# Patient Record
Sex: Male | Born: 1950 | Race: Black or African American | Hispanic: No | Marital: Married | Smoking: Former smoker
Health system: Southern US, Community
[De-identification: ages and names within clinical notes are randomized; demographics above are authoritative.]

## PROBLEM LIST (undated history)

## (undated) DIAGNOSIS — I10 Essential (primary) hypertension: Secondary | ICD-10-CM

## (undated) DIAGNOSIS — N189 Chronic kidney disease, unspecified: Secondary | ICD-10-CM

## (undated) DIAGNOSIS — C679 Malignant neoplasm of bladder, unspecified: Secondary | ICD-10-CM

## (undated) DIAGNOSIS — C159 Malignant neoplasm of esophagus, unspecified: Secondary | ICD-10-CM

## (undated) DIAGNOSIS — J439 Emphysema, unspecified: Secondary | ICD-10-CM

## (undated) HISTORY — PX: BLADDER REMOVAL: SHX567

## (undated) HISTORY — DX: Emphysema, unspecified: J43.9

## (undated) HISTORY — PX: GASTROSTOMY W/ FEEDING TUBE: SUR642

## (undated) HISTORY — DX: Chronic kidney disease, unspecified: N18.9

## (undated) HISTORY — DX: Malignant neoplasm of bladder, unspecified: C67.9

## (undated) SURGERY — Surgical Case
Anesthesia: *Unknown

---

## 2005-11-28 ENCOUNTER — Ambulatory Visit: Payer: Self-pay | Admitting: Gastroenterology

## 2008-06-22 ENCOUNTER — Emergency Department: Payer: Self-pay | Admitting: Emergency Medicine

## 2009-09-25 ENCOUNTER — Ambulatory Visit: Payer: Self-pay | Admitting: Internal Medicine

## 2009-10-07 ENCOUNTER — Ambulatory Visit: Payer: Self-pay | Admitting: General Practice

## 2009-11-19 ENCOUNTER — Ambulatory Visit: Payer: Self-pay | Admitting: Urology

## 2009-11-27 ENCOUNTER — Ambulatory Visit: Payer: Self-pay | Admitting: Cardiovascular Disease

## 2009-12-02 ENCOUNTER — Observation Stay: Payer: Self-pay | Admitting: Urology

## 2009-12-04 LAB — PATHOLOGY REPORT

## 2010-07-14 ENCOUNTER — Ambulatory Visit: Payer: Self-pay | Admitting: Urology

## 2010-12-15 ENCOUNTER — Ambulatory Visit: Payer: Self-pay | Admitting: Urology

## 2011-01-19 ENCOUNTER — Ambulatory Visit: Payer: Self-pay | Admitting: Urology

## 2011-01-20 LAB — PATHOLOGY REPORT

## 2011-07-08 ENCOUNTER — Ambulatory Visit: Payer: Self-pay | Admitting: Anesthesiology

## 2011-07-13 ENCOUNTER — Ambulatory Visit: Payer: Self-pay | Admitting: Urology

## 2011-07-14 LAB — PATHOLOGY REPORT

## 2011-10-09 ENCOUNTER — Emergency Department: Payer: Self-pay | Admitting: Internal Medicine

## 2011-10-09 LAB — COMPREHENSIVE METABOLIC PANEL WITH GFR
Albumin: 3.8 g/dL
Alkaline Phosphatase: 67 U/L
Anion Gap: 6 — ABNORMAL LOW
BUN: 9 mg/dL
Bilirubin,Total: 1.3 mg/dL — ABNORMAL HIGH
Calcium, Total: 8.3 mg/dL — ABNORMAL LOW
Chloride: 101 mmol/L
Co2: 29 mmol/L
Creatinine: 1.08 mg/dL
EGFR (African American): >60
EGFR (Non-African Amer.): >60
Glucose: 82 mg/dL
Osmolality: 270
Potassium: 3.4 mmol/L — ABNORMAL LOW
SGOT(AST): 78 U/L — ABNORMAL HIGH
SGPT (ALT): 122 U/L — ABNORMAL HIGH
Sodium: 136 mmol/L
Total Protein: 7.3 g/dL

## 2011-10-09 LAB — CBC
HCT: 38.8 % — ABNORMAL LOW
HGB: 13.1 g/dL
MCH: 33.5 pg
MCHC: 33.8 g/dL
MCV: 99 fL
Platelet: 173 x10 3/mm 3
RBC: 3.92 x10 6/mm 3 — ABNORMAL LOW
RDW: 13.8 %
WBC: 7.6 x10 3/mm 3

## 2011-10-09 LAB — URINALYSIS, COMPLETE
Nitrite: NEGATIVE
Protein: NEGATIVE
Specific Gravity: 1.005 (ref 1.003–1.030)
WBC UR: 10 /HPF (ref 0–5)

## 2011-10-09 LAB — TROPONIN I: Troponin-I: 0.03 ng/mL

## 2011-11-18 ENCOUNTER — Ambulatory Visit: Payer: Self-pay | Admitting: Internal Medicine

## 2012-08-24 ENCOUNTER — Inpatient Hospital Stay: Payer: Self-pay | Admitting: Internal Medicine

## 2012-08-24 LAB — URINALYSIS, COMPLETE
Bilirubin,UR: NEGATIVE
Ketone: NEGATIVE
Nitrite: NEGATIVE
Ph: 7 (ref 4.5–8.0)
Squamous Epithelial: 4
Transitional Epi: 2
WBC UR: 3003 /HPF (ref 0–5)

## 2012-08-24 LAB — LIPASE, BLOOD: Lipase: 261 U/L (ref 73–393)

## 2012-08-24 LAB — CK TOTAL AND CKMB (NOT AT ARMC): CK, Total: 51 U/L (ref 35–232)

## 2012-08-24 LAB — COMPREHENSIVE METABOLIC PANEL
Anion Gap: 18 — ABNORMAL HIGH (ref 7–16)
Chloride: 93 mmol/L — ABNORMAL LOW (ref 98–107)
Creatinine: 5.99 mg/dL — ABNORMAL HIGH (ref 0.60–1.30)
EGFR (African American): 11 — ABNORMAL LOW
EGFR (Non-African Amer.): 9 — ABNORMAL LOW
Glucose: 131 mg/dL — ABNORMAL HIGH (ref 65–99)
Osmolality: 307 (ref 275–301)
Potassium: 5 mmol/L (ref 3.5–5.1)
SGOT(AST): 16 U/L (ref 15–37)
Sodium: 123 mmol/L — ABNORMAL LOW (ref 136–145)

## 2012-08-24 LAB — CBC WITH DIFFERENTIAL/PLATELET
Basophil #: 0.2 10*3/uL — ABNORMAL HIGH (ref 0.0–0.1)
HGB: 13.5 g/dL (ref 13.0–18.0)
MCH: 32.5 pg (ref 26.0–34.0)
MCHC: 34.4 g/dL (ref 32.0–36.0)
MCV: 94 fL (ref 80–100)
Monocyte #: 0.8 x10 3/mm (ref 0.2–1.0)
Monocyte %: 4.1 %
WBC: 18.8 10*3/uL — ABNORMAL HIGH (ref 3.8–10.6)

## 2012-08-24 LAB — TROPONIN I: Troponin-I: <0.02 ng/mL

## 2012-08-24 LAB — TSH: Thyroid Stimulating Horm: 0.782 u[IU]/mL

## 2012-08-25 ENCOUNTER — Ambulatory Visit: Payer: Self-pay | Admitting: Urology

## 2012-08-25 LAB — PHOSPHORUS: Phosphorus: 6.3 mg/dL — ABNORMAL HIGH (ref 2.5–4.9)

## 2012-08-25 LAB — URINALYSIS, COMPLETE
Bilirubin,UR: NEGATIVE
Ketone: NEGATIVE
Nitrite: NEGATIVE
Protein: 30
Specific Gravity: 1.01 (ref 1.003–1.030)
Squamous Epithelial: 1

## 2012-08-25 LAB — CBC WITH DIFFERENTIAL/PLATELET
Basophil #: 0.1 10*3/uL (ref 0.0–0.1)
Basophil %: 0.5 %
Eosinophil #: 0 10*3/uL (ref 0.0–0.7)
Eosinophil %: 0.3 %
HCT: 32.1 % — ABNORMAL LOW (ref 40.0–52.0)
Lymphocyte #: 1.2 10*3/uL (ref 1.0–3.6)
Lymphocyte %: 7.6 %
MCH: 32.7 pg (ref 26.0–34.0)
MCV: 95 fL (ref 80–100)
Monocyte %: 9 %
RBC: 3.37 10*6/uL — ABNORMAL LOW (ref 4.40–5.90)
RDW: 14 % (ref 11.5–14.5)
WBC: 15.9 10*3/uL — ABNORMAL HIGH (ref 3.8–10.6)

## 2012-08-25 LAB — COMPREHENSIVE METABOLIC PANEL
Albumin: 2.5 g/dL — ABNORMAL LOW (ref 3.4–5.0)
Alkaline Phosphatase: 56 U/L (ref 50–136)
Bilirubin,Total: 0.3 mg/dL (ref 0.2–1.0)
Calcium, Total: 8.6 mg/dL (ref 8.5–10.1)
Chloride: 104 mmol/L (ref 98–107)
Creatinine: 3.48 mg/dL — ABNORMAL HIGH (ref 0.60–1.30)
EGFR (African American): 21 — ABNORMAL LOW
Glucose: 93 mg/dL (ref 65–99)
Potassium: 5 mmol/L (ref 3.5–5.1)
SGOT(AST): 11 U/L — ABNORMAL LOW (ref 15–37)
SGPT (ALT): 10 U/L — ABNORMAL LOW (ref 12–78)
Sodium: 129 mmol/L — ABNORMAL LOW (ref 136–145)

## 2012-08-25 LAB — LIPID PANEL
Ldl Cholesterol, Calc: 58 mg/dL (ref 0–100)
Triglycerides: 93 mg/dL (ref 0–200)
VLDL Cholesterol, Calc: 19 mg/dL (ref 5–40)

## 2012-08-25 LAB — MAGNESIUM: Magnesium: 2.1 mg/dL

## 2012-08-26 LAB — CBC WITH DIFFERENTIAL/PLATELET
Basophil %: 0.2 %
Eosinophil %: 0.2 %
HGB: 10.1 g/dL — ABNORMAL LOW (ref 13.0–18.0)
Lymphocyte %: 2.5 %
MCH: 32.2 pg (ref 26.0–34.0)
MCHC: 34.2 g/dL (ref 32.0–36.0)
Monocyte #: 1.8 x10 3/mm — ABNORMAL HIGH (ref 0.2–1.0)
Neutrophil #: 18.6 10*3/uL — ABNORMAL HIGH (ref 1.4–6.5)
Neutrophil %: 88.4 %
Platelet: 324 10*3/uL (ref 150–440)
RBC: 3.14 10*6/uL — ABNORMAL LOW (ref 4.40–5.90)
RDW: 13.8 % (ref 11.5–14.5)
WBC: 21 10*3/uL — ABNORMAL HIGH (ref 3.8–10.6)

## 2012-08-26 LAB — BASIC METABOLIC PANEL
Anion Gap: 10 (ref 7–16)
Calcium, Total: 8.2 mg/dL — ABNORMAL LOW (ref 8.5–10.1)
Chloride: 113 mmol/L — ABNORMAL HIGH (ref 98–107)
Glucose: 115 mg/dL — ABNORMAL HIGH (ref 65–99)
Osmolality: 304 (ref 275–301)
Potassium: 4.3 mmol/L (ref 3.5–5.1)

## 2012-08-27 LAB — CBC WITH DIFFERENTIAL/PLATELET
Basophil #: 0.1 10*3/uL (ref 0.0–0.1)
Eosinophil #: 0.2 10*3/uL (ref 0.0–0.7)
Eosinophil %: 0.7 %
HCT: 31 % — ABNORMAL LOW (ref 40.0–52.0)
HGB: 10.6 g/dL — ABNORMAL LOW (ref 13.0–18.0)
Lymphocyte #: 1 10*3/uL (ref 1.0–3.6)
Lymphocyte %: 4.4 %
MCH: 32.4 pg (ref 26.0–34.0)
MCHC: 34.2 g/dL (ref 32.0–36.0)
MCV: 95 fL (ref 80–100)
Monocyte #: 1.9 x10 3/mm — ABNORMAL HIGH (ref 0.2–1.0)
Monocyte %: 8.1 %
Platelet: 331 10*3/uL (ref 150–440)
RBC: 3.27 10*6/uL — ABNORMAL LOW (ref 4.40–5.90)
RDW: 13.7 % (ref 11.5–14.5)

## 2012-08-27 LAB — BASIC METABOLIC PANEL
Anion Gap: 7 (ref 7–16)
BUN: 50 mg/dL — ABNORMAL HIGH (ref 7–18)
Chloride: 112 mmol/L — ABNORMAL HIGH (ref 98–107)
EGFR (African American): 50 — ABNORMAL LOW
Glucose: 102 mg/dL — ABNORMAL HIGH (ref 65–99)
Osmolality: 291 (ref 275–301)
Potassium: 4.6 mmol/L (ref 3.5–5.1)
Sodium: 139 mmol/L (ref 136–145)

## 2012-08-27 LAB — URINE CULTURE

## 2012-08-28 LAB — CBC WITH DIFFERENTIAL/PLATELET
Eosinophil #: 0.1 10*3/uL (ref 0.0–0.7)
HCT: 27.8 % — ABNORMAL LOW (ref 40.0–52.0)
HGB: 9.3 g/dL — ABNORMAL LOW (ref 13.0–18.0)
Lymphocyte #: 1.3 10*3/uL (ref 1.0–3.6)
MCHC: 33.3 g/dL (ref 32.0–36.0)
Monocyte %: 8.3 %
Neutrophil #: 11 10*3/uL — ABNORMAL HIGH (ref 1.4–6.5)
Neutrophil %: 80.5 %
RDW: 13.7 % (ref 11.5–14.5)

## 2012-08-28 LAB — IRON AND TIBC
Iron Bind.Cap.(Total): 131 ug/dL — ABNORMAL LOW (ref 250–450)
Iron Saturation: 43 %
Iron: 56 ug/dL — ABNORMAL LOW (ref 65–175)

## 2012-08-28 LAB — RETICULOCYTES
Absolute Retic Count: 0.0179 10*6/uL — ABNORMAL LOW
Reticulocyte: 0.6 %

## 2012-08-28 LAB — BASIC METABOLIC PANEL
BUN: 33 mg/dL — ABNORMAL HIGH (ref 7–18)
Creatinine: 1.63 mg/dL — ABNORMAL HIGH (ref 0.60–1.30)
EGFR (African American): 52 — ABNORMAL LOW
EGFR (Non-African Amer.): 45 — ABNORMAL LOW
Glucose: 108 mg/dL — ABNORMAL HIGH (ref 65–99)
Potassium: 4.5 mmol/L (ref 3.5–5.1)
Sodium: 139 mmol/L (ref 136–145)

## 2012-08-28 LAB — FOLATE: Folic Acid: 12 ng/mL (ref 3.1–100.0)

## 2012-08-29 LAB — CBC WITH DIFFERENTIAL/PLATELET
Basophil %: 0.7 %
Eosinophil #: 0.1 10*3/uL (ref 0.0–0.7)
Eosinophil %: 0.9 %
HGB: 9.4 g/dL — ABNORMAL LOW (ref 13.0–18.0)
Lymphocyte #: 1.7 10*3/uL (ref 1.0–3.6)
MCH: 32.5 pg (ref 26.0–34.0)
MCHC: 34.1 g/dL (ref 32.0–36.0)
MCV: 95 fL (ref 80–100)
Monocyte #: 1.2 x10 3/mm — ABNORMAL HIGH (ref 0.2–1.0)
Monocyte %: 8.7 %
Neutrophil #: 11 10*3/uL — ABNORMAL HIGH (ref 1.4–6.5)
Neutrophil %: 77.6 %
Platelet: 354 10*3/uL (ref 150–440)
RBC: 2.91 10*6/uL — ABNORMAL LOW (ref 4.40–5.90)
RDW: 13.6 % (ref 11.5–14.5)
WBC: 14.2 10*3/uL — ABNORMAL HIGH (ref 3.8–10.6)

## 2012-08-29 LAB — BASIC METABOLIC PANEL
Anion Gap: 7 (ref 7–16)
Calcium, Total: 8.4 mg/dL — ABNORMAL LOW (ref 8.5–10.1)
Creatinine: 1.41 mg/dL — ABNORMAL HIGH (ref 0.60–1.30)
EGFR (Non-African Amer.): 54 — ABNORMAL LOW
Glucose: 89 mg/dL (ref 65–99)
Potassium: 3.9 mmol/L (ref 3.5–5.1)

## 2012-08-30 LAB — CBC WITH DIFFERENTIAL/PLATELET
Basophil %: 0.9 %
Eosinophil %: 1.9 %
HCT: 28.3 % — ABNORMAL LOW (ref 40.0–52.0)
HGB: 9.7 g/dL — ABNORMAL LOW (ref 13.0–18.0)
MCHC: 34.4 g/dL (ref 32.0–36.0)
MCV: 96 fL (ref 80–100)
Monocyte #: 1.1 x10 3/mm — ABNORMAL HIGH (ref 0.2–1.0)
Monocyte %: 8.9 %
Neutrophil #: 9.2 10*3/uL — ABNORMAL HIGH (ref 1.4–6.5)
Platelet: 359 10*3/uL (ref 150–440)
RDW: 13.4 % (ref 11.5–14.5)
WBC: 12.6 10*3/uL — ABNORMAL HIGH (ref 3.8–10.6)

## 2012-08-30 LAB — CREATININE, SERUM
Creatinine: 1.5 mg/dL — ABNORMAL HIGH (ref 0.60–1.30)
EGFR (Non-African Amer.): 50 — ABNORMAL LOW

## 2014-07-25 NOTE — Consult Note (Signed)
PATIENT NAME:  Jeffrey [name], HYDE MR#:  [id number] DATE OF BIRTH:  [birth year]  DATE OF CONSULTATION:  08/28/2012  REFERRING PHYSICIAN:   CONSULTING PHYSICIAN:  Janice Coffin. Elnoria Howard, DO  [name] was admitted 08/24/2012 with sepsis and renal failure. Foley catheter was placed and the patient consequently had a decrease in his creatinine from 5.0 to 1.6.  He had a catheter placed in his bladder.  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 His normal physician is Dr. Clydene Laming who had told the patient he needs to cath himself 4 to 5 times a day, and the patient was probably not really complying with this order. So now he is defervesced. He is sensitive to Rocephin. The plan is to switch him to oral Keflex and discharge him on the same back. He will go back to his family doctor. We are going to put him on q. 6 hour cath regimen, which he can do himself.  They will be in and out caths.  Slowly it will be discontinued. Foley will have to be flushed p.r.n. if the residuals are higher than should be. But I think if he gets on a regular schedule, keeps his bladder clear of debris, he will be fine. So, this 64 year old [place] American male discussed this situation with me. At the present time, his abdomen is soft and he has no evidences of sepsis. Vital signs are stable. With his creatinine being under 2, I think we are safe to have him cathed. I have told him he should be especially sensitive to cathing himself before he goes to bed at night, in the morning when he wakes up and then in between at least twice during the day. He may need some help with learning to flush his catheter if debris occurs. At the present time, his glucose, BUN and creatinine have stabilized.  His white count is down to 13 from as high as 23,000.  He remains afebrile.  Sensitivities are excellent to ceftriaxone and also Cipro, Bactrim and nitrofurantoin. So he will be sent home on Keflex. I have discussed this with  his attending, Dr. Caryl Comes.    ____________________________ Janice Coffin. Elnoria Howard, DO rdh:sb D: 08/28/2012 07:54:47 ET T: 08/28/2012 08:08:55 ET JOB#: [id number]  cc: Janice Coffin. Elnoria Howard, DO, <Dictator>   Jaianna Nicoll D Kerrigan Gombos DO ELECTRONICALLY SIGNED 08/30/2012 17:15

## 2014-07-25 NOTE — Discharge Summary (Signed)
PATIENT NAME:  Jeffrey [name], Jeffrey [name] MR#:  [id number] DATE OF BIRTH:  Apr 10, 1950  DATE OF ADMISSION:  08/24/2012 DATE OF DISCHARGE:  08/30/2012  FINAL DIAGNOSES:  1.  Acute renal failure secondary to urinary obstruction.  2.  Polymicrobial urinary tract infection, including Klebsiella and Citrobacter.  3.  Sepsis secondary to #1 and #2.  4.  History of bladder cancer with multiple prior surgeries.  5.  Hypertension. 6.  Hyperlipidemia.  7.  History of tobacco abuse.   HISTORY AND PHYSICAL: Please see dictated admission history and physical.   HOSPITAL COURSE: The patient was admitted with fatigue, malaise and found to have evidence of acute renal failure. CT scan revealed diffuse bilateral hydronephrosis consistent with urinary obstruction. Catheterization was initiated with significant urine output. The patient was to be self catheterizing at home, but was doing this once a day. Urine cultures were obtained, which were grew out Klebsiella and Citrobacter. He had been placed on Levaquin and the sensitivities suggested these organisms would be susceptible to this; however, he was noted to have worsening white blood cell count with a peak white blood count over 20,000. He was switched over to broader spectrum antibiotics and began to have improvement. He was subsequently changed over to Bactrim, followed and his white count improved down to 12 and he remained afebrile.   Nephrology and urology saw the patient. Urology had the patient catheterize himself 5 times a day and he demonstrated good technique and abilities to perform this. Creatinine improved to 1.5 and he was continued on IV fluids for post obstructive diuresis, which improved over the course of his hospitalization. His blood pressure medications were held. His blood pressure remained stable throughout the rest of his hospitalization. His appetite improved. He was ambulating independently. At this point, he will be discharged to home in stable  condition. His physical activity will be up as tolerated. We will have him out of work for 1 week and return next week to re-evaluate his renal function. He will finish a course of antibiotics and he will need to check his blood pressure and heart rate periodically. He will continue to use self-catheterization of the bladder every 5 hours. He will follow up with nephrology, Dr. Holley Raring, in the next 1 to 2 weeks. I will schedule him to follow up with Dr. Elyse Hsu at [facility] urology in the next 2 to 4 weeks in addition.   DISCHARGE MEDICATIONS:  1.  Bactrim DS one p.o. b.i.d. x 8 days to complete 14 day course of antibiotics.  2.  The patient was given instructions to hold lisinopril at this point.    ____________________________ Adin Hector, MD bjk:aw D: 08/30/2012 08:00:04 ET T: 08/30/2012 10:06:37 ET JOB#: [id number]  cc: Adin Hector, MD, <Dictator> Elyse Hsu, MD AT [facility] UROLOGY Tama High, MD Ramonita Lab MD ELECTRONICALLY SIGNED 09/17/2012 7:19

## 2014-07-25 NOTE — H&P (Signed)
PATIENT NAME:  Jeffrey [name], Jeffrey [name] MR#:  [id number] DATE OF BIRTH:  [birth year]  DATE OF ADMISSION:  08/24/2012  CHIEF COMPLAINT: Weakness, malaise, and dizziness.   REFERRING PHYSICIAN: Pollie Friar, MD   PRIMARY CARE PHYSICIAN: Ramonita Lab, MD  HISTORY OF PRESENT ILLNESS: Mr. Jeffrey [name] is a very nice 64 year old gentleman who has history of hypertension, bladder cancer, hyperlipidemia, tobacco use, and BPH who comes with a history of feeling really weak for the past week or 2. He says he has started to feel really tired for at least 3 weeks and he has been in contact with his primary care physician who has been treating him with antibiotics for what is supposed to be a urinary tract infection. The patient has also a urologist at [facility] who has done removal of his prostate and so far he has been told that he should not get any antibiotics based on the fact that he is always going to have elevated white blood cells. At this moment, the patient comes with a history of just getting tired and tired and getting short of breath when he walks, but mostly just because he feels tired not because he is lacking air, he explains. The patient has not been eating or drinking much. He has been taking some Ensure instead of meals. He just states that he lacks an appetite a lot. He started having some cramping today, 9 out of 10, and he had diarrhea for the past 2 days. He states that his diarrhea is just 1 or 2 bowel movements a day and it is just loose stool. The patient also stated that he has been having occasional chills, but he denies any fever whatsoever. No significant sweating overnight.  The patient states that he has had at least 12 pound weight loss for the past 2 weeks. The patient's wife is very concerned about this situation since her husband was his normal self at least 3 weeks ago without any major symptomatology. The patient is admitted for further evaluation.  REVIEW OF SYSTEMS: CONSTITUTIONAL:  Denies any fever. Positive fatigue. Positive weakness. The patient denies any significant pain. Positive 12 pounds or more weight loss.  EYES: No blurry vision, double vision, pain or redness.  ENT: No tinnitus. No ear pain. The patient has a little bit of a deeper voice, but could be due to his history of smoking, but he denies any odynophagia, any hoarseness. He says that his voice sounds like that all the time. His wife says it might be lower. No difficulty swallowing.  RESPIRATORY: No cough. No wheezing. No hemoptysis. No significant respiratory distress or shortness of breath up until he starts moving around.  CARDIOVASCULAR: No chest pain, orthopnea, syncope, or edema.  GASTROINTESTINAL: Positive mild diarrhea. No nausea.  No vomiting. Positive lack of appetite. No rectal bleeding. No melena. No hematemesis.  GENITOURINARY: No significant dysuria or hematuria. The patient states that he caths himself once or twice a day, but most of the time he is able to urinate by himself. No previous UTIs or prostatitis that he can tell. He has an enlarged prostate and history of bladder tumor that has been removed.  ENDOCRINE: No polyuria, polydipsia, or polyphagia. No cold or heat intolerance. No tachycardia.  HEMATOLOGIC AND LYMPHATIC:  No anemia, easy bruising, bleeding, or swollen glands.  SKIN: No acne, rashes, or new moles.  MUSCULOSKELETAL: No significant neck pain, back pain, shoulder pain, knee pain, or gout.  NEUROLOGIC: No numbness, tingling. No ataxia. No  CVA. No TIA.  PSYCHIATRIC: No insomnia or depression that he can tell.   PAST MEDICAL HISTORY: 1.  Hyperlipidemia.  2.  Benign prostatic hypertrophy.  3.  Tobacco abuse.  4.  History of bladder cancer.  5.  Hypertension.   PAST SURGICAL HISTORY: 1.  Resection of bladder tumor. 2.  Cystoscopy and TURP in the past. He denies any other procedures.   ALLERGIES: ASPIRIN GAVE GI DISTRESS.   MEDICATIONS:  1.  Lisinopril 20 mg once  daily. 2.  Occasionally takes Advil and Benadryl as p.r.n.   FAMILY HISTORY: Positive for cancer in his mom and sister, breast cancer, and his brother had lung cancer. No MIs.  No diabetes or hypertension.    SOCIAL HISTORY: The patient is a former smoker. He quit 3 weeks ago. He has been using the electronic cigarette. He used to smoke 1 pack a day for over 40 years. He lives with his wife and he still works.   PHYSICAL EXAMINATION: VITAL SIGNS: Blood pressure 105/57, pulse 90, respirations 18, temperature 98.1.  GENERAL: Alert and oriented x 3, cachetic, chronically debilitated. No acute distress at this moment. Very flat affect.  Looks severely dehydrated. Pupils are equal and reactive. Extraocular movements are intact. Mucosa is very dry. Anicteric sclerae. Pale conjunctivae. No oral lesions. No oropharyngeal exudates.  NECK: Supple. No JVD. No thyromegaly. No adenopathy. No carotid bruits. I did not palpate any masses or lymphadenopathy. No rigidity.  HEART:  Regular rate and rhythm. No murmurs, rubs, or gallops. Slightly tachycardic, in the 90s. No displacement of PMI.  LUNGS: Clear without any wheezing or crepitus. No use of accessory muscles.  ABDOMEN: Soft, nontender, and nondistended. No hepatosplenomegaly. I did not palpate any masses. I did not palpate any tender areas. No rebound. Bowel sounds are positive.  EXTREMITIES: No edema, no cyanosis, no clubbing. Pulses +2. Capillary refill less than 3.  SKIN: Decreased turgor, but no rashes or petechiae.   MUSCULOSKELETAL: No significant joint effusions. No rigidity of joints. Normal range of motion.  PSYCHIATRIC: Mood: Flat affect without any agitation.  NEUROLOGIC: Cranial nerves II through XII intact. Strength seems to be equal in all 4 extremities, 5 out of 5. DTRs +2. Capillary refill less than 3.  LYMPHATICS: Negative for lymphadenopathy in neck or supraclavicular areas.  LABORATORY AND DIAGNOSTICS:  Glucose 131, BUN 173,  creatinine 5.99, sodium 123, potassium 5, chloride 93, and CO2 12. Total protein 8.5 and albumin 3.2. Troponin is 0.02. White blood cells 18, hemoglobin 13.5, and platelet count clumping, but the smear indicates that it is not decreased, just clumped.  UA is cloudy with 357 red blood cells, white blood cells of 3000. Apparently this is chronic on him. Positive leukocyte esterase.  Negative nitrite.   EKG:  Normal sinus rhythm, sinus tachycardia.   ASSESSMENT AND PLAN: A 64 year old gentleman with history of hypertension and previous bladder cancer status post resection who comes with significant weight loss and malaise.  1.  Malaise and weight loss. The patient has significant history of previous cancer for what we are going to do a CT scan to check for possibility of recurrence.  At this moment, most of his symptoms, lack of appetite, malaise, weakness, and orthostatic hypotension might be related to uremia as his BUN is 173. At this moment, I am going to consult nephrology to see if there is any other workup they would like to do. It seems like the patient has possibilities of being dehydrated from not eating  well and this being prerenal with acute tubular necrosis. I doubt there is an infectious source as the patient has not had any fever. He has increased white blood cells in urine, which apparently is chronic, and overall he is taking a couple of medications that could affect him: 1) lisinopril, 2) Advil. We are going to hold on those 2 medications and avoid any nephrotoxins.  2.  Acute kidney injury. As mentioned above, the patient is uremic.  We are going to consult nephrology. For now we are going to do intravenous fluids with isotonic solution.  3.  Metabolic acidosis. This is likely secondary to acute kidney injury. We will continue with isotonic saline solution and change to bicarbonate drip if necessary, if not improving or worsening.  4.  Hypertension. Hold blood pressure medications as the  patient has orthostatic hypotension. 5.  Bladder tumor status post resection. Get a CT scan to evaluate for possible post-obstructive uropathy from tumor growth versus recurrence of the tumor.  6.  Deep vein thrombosis prophylaxis with heparin pain.  7.  Gastrointestinal prophylaxis with Protonix.  8.  Other medical problems are stable. The patient is a FULL CODE. We are going to pass the care to Dr. Ramonita Lab. time spend 45 min  ____________________________ Burgin Sink, MD rsg:sb D: 08/24/2012 12:33:45 ET T: 08/24/2012 13:25:30 ET JOB#: [id number]  cc: Blasdell Sink, MD, <Dictator> Anikin Prosser America Brown MD ELECTRONICALLY SIGNED 08/30/2012 21:35

## 2014-07-25 NOTE — Consult Note (Signed)
Chief Complaint:  Subjective/Chief Complaint Urology F/U  HD #3  Day #4 IV Abx's (Rocephin x2, changed to Zosyn, now Day #2)  Pt continues without complaints. Still without excessive thirst.   VITAL SIGNS/ANCILLARY NOTES: **Vital Signs.:   26-May-14 04:55  Vital Signs Type Routine  Temperature Temperature (F) 99.9  Celsius 37.7  Temperature Source oral  Pulse Pulse 105  Respirations Respirations 18  Systolic BP Systolic BP 165  Diastolic BP (mmHg) Diastolic BP (mmHg) 72  Mean BP 85  Pulse Ox % Pulse Ox % 96  Pulse Ox Activity Level  At rest  Oxygen Delivery Room Air/ 21 %  *Intake and Output.:   Daily 26-May-14 07:00  Grand Totals Intake:  2618 Output:  3400    Net:  -782 24 Hr.:  -782  Oral Intake      In:  360  IV (Primary)      In:  2108  IV (Secondary)      In:  150  Urine ml     Out:  3400  Length of Stay Totals Intake:  6024 Output:  53748    Net:  -4601    10:26  Grand Totals Intake:   Output:  900    Net:  -900 24 Hr.:  -900  Urine ml     Out:  900  Urinary Method  Foley   Brief Assessment:  Respiratory normal resp effort   Additional Physical Exam WDWN AAM in NAD   Lab Results: LabUnknown:  24-May-14 12:31   CEFOXITIN R  CEFOXITIN S  Routine Micro:  24-May-14 12:31   Organism Name Lares  Organism Name KLEBSIELLA PNEUMONIAE SSP PNEUMONI  Organism Quantity >100,000 CFU  Organism Quantity >100,000 CFU/ML  Nitrofurantoin Sensitivity S  Nitrofurantoin Sensitivity S  Cefazolin Sensitivity R  Cefazolin Sensitivity S  Ceftriaxone Sensitivity S  Ceftriaxone Sensitivity S  Ciprofloxacin Sensitivity S  Ciprofloxacin Sensitivity S  Gentamicin Sensitivity S  Gentamicin Sensitivity S  Imipenem Sensitivity S  Imipenem Sensitivity S  Trimethoprim/Sulfamethoxazole Sensitivty S  Trimethoprim/Sulfamethoxazole Sensitivty S  Lefofloxacin Sensitivity S  Lefofloxacin Sensitivity S  Ampicillin Sensitivity R  Micro Text Report URINE  CULTURE   ORGANISM 1                >100,000 CFU/ML KLEBSIELLA PNEUMONIAE SSP PNEUMONI   ORGANISM 2                >100,000 CFU CITROBACTER FREUNDII   ANTIBIOTIC                    ORG#1    ORG#2     AMPICILLIN                    R         CEFAZOLIN                     S        R         CEFOXITIN                     S        R         CEFTRIAXONE                   S        S         CIPROFLOXACIN  S        S         GENTAMICIN                    S        S        IMIPENEM                      S        S         LEVOFLOXACIN                  S        S         NITROFURANTOIN                S        S         TRIMETHOPRIM/SULFAMETHOXAZOLE S        S  Specimen Source INDWELLING CATH  Organism 1 >100,000 CFU/ML KLEBSIELLA PNEUMONIAE SSP PNEUMONI  Organism 2 >100,000 CFU CITROBACTER FREUNDII  Result(s) reported on 27 Aug 2012 at 09:06AM.  Culture Comment ID TO FOLLOW SENSITIVITIES TO FOLLOW  Result(s) reported on 26 Aug 2012 at 11:59AM.  Routine Chem:  26-May-14 04:14   Glucose, Serum  102  BUN  50  Creatinine (comp)  1.68  Sodium, Serum 139  Potassium, Serum 4.6  Chloride, Serum  112  CO2, Serum  20  Calcium (Total), Serum  8.3  Anion Gap 7  Osmolality (calc) 291  eGFR (African American)  50  eGFR (Non-African American)  43 (eGFR values <70m/min/1.73 m2 may be an indication of chronic kidney disease (CKD). Calculated eGFR is useful in patients with stable renal function. The eGFR calculation will not be reliable in acutely ill patients when serum creatinine is changing rapidly. It is not useful in  patients on dialysis. The eGFR calculation may not be applicable to patients at the low and high extremes of body sizes, pregnant women, and vegetarians.)  Routine UA:  24-May-14 12:31   Color (UA) Yellow  Clarity (UA) Cloudy  Glucose (UA) Negative  Bilirubin (UA) Negative  Ketones (UA) Negative  Specific Gravity (UA) 1.010  Blood (UA) 3+  pH (UA) 6.0   Protein (UA) 30 mg/dL  Nitrite (UA) Negative  Leukocyte Esterase (UA) 3+ (Result(s) reported on 25 Aug 2012 at 01:06PM.)  RBC (UA) 150 /HPF  WBC (UA) 270 /HPF  Bacteria (UA) 3+  Epithelial Cells (UA) 1 /HPF  WBC Clump (UA) PRESENT (Result(s) reported on 25 Aug 2012 at 01:06PM.)  Routine Hem:  26-May-14 04:14   WBC (CBC)  23.6  RBC (CBC)  3.27  Hemoglobin (CBC)  10.6  Hematocrit (CBC)  31.0  Platelet Count (CBC) 331  MCV 95  MCH 32.4  MCHC 34.2  RDW 13.7  Neutrophil % 86.4  Lymphocyte % 4.4  Monocyte % 8.1  Eosinophil % 0.7  Basophil % 0.4  Neutrophil #  20.4  Lymphocyte # 1.0  Monocyte #  1.9  Eosinophil # 0.2  Basophil # 0.1 (Result(s) reported on 27 Aug 2012 at 05:08AM.)   Assessment/Plan:  Invasive Device Daily Assessment of Necessity:  Does the patient currently have any of the following indwelling devices? foley   Indwelling Urinary Catheter continued, requirement due to   Reason to continue Indwelling Urinary Catheter for strict Intake/Output monitoring for hemodynamic instability  inability to void as documented by bladder scanning  Assessment/Plan:  Assessment 1. Obstructive Uropathy -continues to respond nicely to foley catheter decompression of the neobladder -diuresis persists, but slowing somewhat (3422m/24hrs; 9056mmost recent 3.5hrs = 26032mour). Mild tachycardia, but remains hemodynamically stable.  2. Klebsiella and Citrobacter UTI -afebrile with Tmax 99.8F, however with increasing leukocytosis, despite switching to IV Zosyn - ?inducible "on therapy resistance." Both organisms are sensitive to flourquinolones.  3. Bladder Cancer - remains clinically stable  4. h/o ETOH -no signs of withdrawal   Plan 1. Consider adding Cipro to Zosyn. 2. Agree with repeat CT Abd/Pelvis to r/o abscess. 3. Continue to monitor UO/hemodynamics closely. 4. Continue to maintain free access to cool water for prn intake per the pt's thirst mechanism.  Dr. HarElnoria Howardill [facility] at 5am, Tuesday, Aug 28, 2012, if continued Urologic assistance desired (Pt prefers NOT to see Dr. StoBernardo Heater  Electronic Signatures: KimDarcella CheshireD)  (Signed 26-May-14 11:30)  Authored: Chief Complaint, VITAL SIGNS/ANCILLARY NOTES, Brief Assessment, Lab Results, Assessment/Plan   Last Updated: 26-May-14 11:30 by KimDarcella CheshireD)

## 2014-07-25 NOTE — Consult Note (Signed)
Chief Complaint:  Subjective/Chief Complaint Urology F/U  HD #2  Day #3 IV Rocephin  Pt without new complaints. Denies excessive thirst. Passing gas, but no BM - denies abdominal pain, N/V.   VITAL SIGNS/ANCILLARY NOTES: **Vital Signs.:   25-May-14 04:30  Vital Signs Type Routine  Temperature Temperature (F) 98.6  Celsius 37  Temperature Source oral  Pulse Pulse 89  Respirations Respirations 18  Systolic BP Systolic BP 073  Diastolic BP (mmHg) Diastolic BP (mmHg) 62  Mean BP 75  Pulse Ox % Pulse Ox % 97  Pulse Ox Activity Level  At rest  Oxygen Delivery Room Air/ 21 %  *Intake and Output.:   Shift 25-May-14 07:00  Grand Totals Intake:  1500 Output:  1700    Net:  -200 24 Hr.:  -4400  IV (Primary)      In:  1500  Urine ml     Out:  1700  Length of Stay Totals Intake:  3406 Output:  7225    Net:  -3819    Daily 07:00  Grand Totals Intake:  1500 Output:  5900    Net:  -4400 87 Hr.:  -4400  IV (Primary)      In:  1500  Urine ml     Out:  4400  Urine Post Catheter Insertion      Out:  1500  Length of Stay Totals Intake:  3406 Output:  7225    Net:  -3819   Brief Assessment:  Respiratory normal resp effort   Additional Physical Exam WDWN AAM in NAD   Lab Results: Routine Chem:  25-May-14 04:06   Glucose, Serum  115  BUN  94  Creatinine (comp)  2.05  Sodium, Serum 137  Potassium, Serum 4.3  Chloride, Serum  113  CO2, Serum  14  Calcium (Total), Serum  8.2  Anion Gap 10  Osmolality (calc) 304  eGFR (African American)  40  eGFR (Non-African American)  34 (eGFR values <73m/min/1.73 m2 may be an indication of chronic kidney disease (CKD). Calculated eGFR is useful in patients with stable renal function. The eGFR calculation will not be reliable in acutely ill patients when serum creatinine is changing rapidly. It is not useful in  patients on dialysis. The eGFR calculation may not be applicable to patients at the low and high extremes of body sizes,  pregnant women, and vegetarians.)  Routine Hem:  25-May-14 04:06   WBC (CBC)  21.0  RBC (CBC)  3.14  Hemoglobin (CBC)  10.1  Hematocrit (CBC)  29.6  Platelet Count (CBC) 324  MCV 94  MCH 32.2  MCHC 34.2  RDW 13.8  Neutrophil % 88.4  Lymphocyte % 2.5  Monocyte % 8.7  Eosinophil % 0.2  Basophil % 0.2  Neutrophil #  18.6  Lymphocyte #  0.5  Monocyte #  1.8  Eosinophil # 0.0  Basophil # 0.0 (Result(s) reported on 26 Aug 2012 at 05:32AM.)   Assessment/Plan:  Invasive Device Daily Assessment of Necessity:  Does the patient currently have any of the following indwelling devices? foley   Indwelling Urinary Catheter continued, requirement due to   Reason to continue Indwelling Urinary Catheter for strict Intake/Output monitoring for hemodynamic instability  inability to void as documented by bladder scanning   Assessment/Plan:  Assessment 1. Obstructive Uropathy -responding nicely to foley catheter decompression of the neobladder -diuresis persists (44026m24hrs; 170033most recent 5hrs = 340m38mur), but remains hemodynamically stable.  2. GNR UTI -febrile with Tmax 102.8 yesterday afternoon with  increasing leukocytosis, despite IV Rocephin - ? resistant organism  3. Bladder Cancer - remains clinically stable  4. h/o ETOH -no signs of withdrawal   Plan 1. Change antibiotic coverage from Rocephin to Zosyn. 2. Continue to monitor UO/hemodynamics closely. 3. Continue to maintain free access to cool water for prn intake per the pt's thirst mechanism.   Electronic Signatures: Darcella Cheshire (MD)  (Signed [phone] 09:24)  Authored: Chief Complaint, VITAL SIGNS/ANCILLARY NOTES, Brief Assessment, Lab Results, Assessment/Plan   Last Updated: 25-May-14 09:24 by Darcella Cheshire (MD)

## 2014-07-25 NOTE — H&P (Signed)
PATIENT NAME:  Jeffrey [name], Jeffrey [name] MR#:  [id number] DATE OF BIRTH:  March 05, 1951  DATE OF ADMISSION:  08/24/2012  ADDENDUM for history and physical.  The patient had a CT scan done. I am just getting the results back now showing moderate hydronephrosis, bilateral. This is more suspicious today, especially in the left. There are fluid fields structures adjacent might be distended urinary bladder. There are fluid-filled structures beginning on images 82. No acute hepatobiliary abnormalities. Stable right adrenal mass.   The patient's case was discussed with Dr. Candiss Norse who is going to see the patient as well but he feels like this is more a urologic problem as we know from the results of the CT scan. I spoke also with Dr. Cathie Olden, Radiology, who feels like this is mostly an outlet obstruction, that he should be okay without nephrostomy tubes at this moment. I spoke also with Dr. Rogers Blocker, who states that he feels like this is also something that might be improved after a Foley catheter is placed. At this moment, a Foley catheter has been ordered after the results of the MRI were reviewed and we are going to follow up on the results of his labs in the morning.   I checked out the case also to Dr. Ola Spurr.  TIME SPENT:  So far I have spent 90 minutes with this patient today.   ____________________________ [place] Sink, MD rsg:jm D: 08/24/2012 16:59:02 ET T: 08/24/2012 18:19:40 ET JOB#: [id number]  cc: Pascola Sink, MD, <Dictator> Allani Reber America Brown MD ELECTRONICALLY SIGNED 08/30/2012 21:35

## 2014-07-25 NOTE — Consult Note (Signed)
Urology Consultation Report: For Consultation: Obstructive Uropathy MD: Dunlap Sink, M.D.MD: Darcella Cheshire, M.D. Ramonita Lab, M.D.Urologist: Heinz Knuckles. Sherral Hammers, M.D., [facility] 72. y.o. MAAM admitted to [facility] 08/24/2012 with AKI, Metabloic Acidosis, Malaise who was in his usual state of health until 08/10/2012 when he developed  progressive malaise and fatigue with periumbical discomfort, loose BM's, and vocal hoarseness. He was initially treated for an URI with a Z-pack and a nasal spray. When he did not improve, he was referred to the [facility] ER where he was found to be azotemic with a creatinine of 5.99.  He also had a leukocytosis with a WBC of 18.8k, but no fever or systemic signs of infection. A urine culture is growing out two species of GNR's. The pt was placed on Rocephin empirically. CT of the Abd/Pelvis after admission revealed moderate bilateral hydro with a distended bladder. A foley catheter was placed with 1358mL of urine obtained as of 7am this morning. Thus far, today, the pt has put out ?2217mL of urine with 791mL from 7:23am to 10:43 am. His creatinine has improved to 3.48 this morning with the pt feeling subjectively much improved. He denies any excessive sense of thirst. Hx: The pt reports being s/p Radical Cystectomy with Orthotopic Neobladder Formation (Robotic-assisted, Laparoscopic) at [facility] in 12/2011 by Dr. Elyse Hsu (?BCG-refractory High Grade, Urothelial Carcinoma of the Urinary Bladder). The patient reports that after his last f/u appt with Dr. Sherral Hammers in April, he was instructed to increase self-catheterization from every other day, to daily, which he performs at bedtime. He typically obtains 300-534mL with catheterization. He only voids 2-3 times during the day with a good stream (voids with Valsalva) with large volumes passed with occasional urge incontinence. He denies any dysuria, gross hematuria, or F/C. reviewed with the patient as documented by Dr. Danise Mina with the  following exceptions: The pt only performs CIC once a day.The pt denies any h/o hyperlipidemia.The pt denies any past h/o TURP.The pt reports smoking 2ppd for 40 years (he has been using electronic cigarettes for the past 3 weeks).He has a h/o Alcohol Use (1 six-pack beer/day prior to his Cystectomy - developed DT's post-op; currently, only drinks on occas - last, about 4 weeks ago: 1 six-pack).The pt is s/p Radical Cystectomy with OTB at [facility] 12/2011 T (97.32F), P (81), RR (18), BP (93/55), RA Sat (99%)WDWN AAM in NADWarm/Dry, no lesions about the head/neckNC/AT, EOMI, anicteric, mild dental indentation on the tongueno masses or bruitsCTA, nL Resp EffortRRR without M/G/R, 2+ Radial pulses b/LSoft/Flat, NT/ND, NABS, no palpable masses/organomegalyno edema, NTnon-focalA&O x4 as per the HPI  Obstructive Uropathy - this has likely been developing for several weeks. He is responding nicely to foley catheter decompression of his neo-bladder. Would expect 1mg % improvement in the creatinine, per day, with relief of the obstruction. Urine output >272mL/hr this morning, with mild clinical dehydration, but no signs of hemodynamic instability. GNR UTI - clinically stable. Bladder Cancer - clinically stable h/o ETOH  Keep foley until f/u with Dr. Elyse Hsu at [facility] after discharge.Monitor hemodynamics closely while diuresing as he may have impaired renal concentrating ability.Keep 2 pitchers of cool water at the bedside for prn intake per his thirst mechanism (would not replace urine output with MIVF unless develops hemodynamic instability to prevent perpetuation of the diuresis; if hemodynamic instability develops, would replace UO 0.5cc per ml output with NS.)CIWA Protocol follow with you. you for the opportunity to participate in the care of this most pleasant, but complicated gentleman.  Electronic Signatures: Darcella Cheshire (MD)  (Signed on 24-May-14 12:54)  Authored  Last Updated: 24-May-14 12:54 by Darcella Cheshire (MD)

## 2014-07-27 NOTE — Op Note (Signed)
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   POSTOPERATIVE DIAGNOSIS: Bladder tumor.   PROCEDURE: Transurethral resection of bladder tumor (small).   SURGEON: Scott C. Bernardo Heater, MD   ASSISTANT: None.   ANESTHETIC: General.   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 He recently completed a second six week course of intravesical BCG for recurrent noninvasive tumor. Recent surveillance cystoscopy showed some erythema on the posterior wall. A urine cytology returned positive for urothelial malignancy. He presents for cystoscopy under anesthesia and possible bladder biopsy/TURBT.   DESCRIPTION OF PROCEDURE: The patient was taken to the operating room where a general anesthetic was administered. He was placed in the low lithotomy position and his external genitalia were prepped and draped sterilely. Time-out was performed. A 21 French cystoscope with 30 degree lens was lubricated and passed under direct vision. There were two wide caliber bulb strictures which the scope easily negotiated. Prostate shows mild lateral lobe enlargement. Panendoscopy was performed with 30 and 70 degree lenses. On the right hemi trigone there was an area of abnormal appearing tissue with combination of papillary nodular change measuring less than 2 cm. The left ureteral orifice was resected and demonstrates clear efflux. Right ureteral orifice with clear efflux. There was an area of early papillary change just beyond the right ureteral orifice. No other bladder mucosal lesions were identified. The cystoscope was removed. A continuous flow resectoscope sheath with visual obturator was lubricated and passed under direct vision. The [facility] resectoscope with loop was  then placed in the sheath. The right hemi trigone tumor was resected to superficial muscle. All specimen was removed with irrigation. The loop was replaced with a button electrode and the resection site and surrounding mucosa fulgurated. The early papillary tissue beyond the right ureteral orifice was also fulgurated taking care not to involve the ureteral orifices. At the completion of procedure, hemostasis was adequate. The resectoscope was removed. A 20 French Foley catheter was placed with return of clear effluent. A B and O suppository was placed per rectum. He was taken to PAC-U in stable condition. There were no complications and EBL was minimal.   ____________________________ Ronda Fairly. Bernardo Heater, MD scs:drc D: 07/13/2011 11:28:41 ET T: 07/13/2011 12:13:22 ET JOB#: [id number]  cc: Nicki Reaper C. Bernardo Heater, MD, <Dictator> Abbie Sons MD ELECTRONICALLY SIGNED 07/28/2011 18:46

## 2014-09-11 ENCOUNTER — Other Ambulatory Visit
Admission: RE | Admit: 2014-09-11 | Discharge: 2014-09-11 | Disposition: A | Discharge status: Home / Self Care | Payer: Self-pay | Source: Ambulatory Visit | Attending: Nephrology | Admitting: Nephrology

## 2014-09-11 DIAGNOSIS — N183 Chronic kidney disease, stage 3 (moderate): Secondary | ICD-10-CM | POA: Insufficient documentation

## 2014-09-11 LAB — RENAL FUNCTION PANEL
Albumin: 3.9 g/dL (ref 3.5–5.0)
Anion gap: 6 (ref 5–15)
BUN: 48 mg/dL — ABNORMAL HIGH (ref 6–20)
CALCIUM: 8.7 mg/dL — AB (ref 8.9–10.3)
CHLORIDE: 116 mmol/L — AB (ref 101–111)
CO2: 17 mmol/L — ABNORMAL LOW (ref 22–32)
CREATININE: 2.86 mg/dL — AB (ref 0.61–1.24)
GFR calc non Af Amer: 22 mL/min — ABNORMAL LOW (ref >60)
GFR, EST AFRICAN AMERICAN: 26 mL/min — AB (ref >60)
Glucose, Bld: 107 mg/dL — ABNORMAL HIGH (ref 65–99)
PHOSPHORUS: 3.1 mg/dL (ref 2.5–4.6)
Potassium: 4.1 mmol/L (ref 3.5–5.1)
Sodium: 139 mmol/L (ref 135–145)

## 2014-12-06 ENCOUNTER — Ambulatory Visit: Payer: Self-pay

## 2015-01-20 ENCOUNTER — Ambulatory Visit
Admission: RE | Admit: 2015-01-20 | Discharge: 2015-01-20 | Disposition: A | Discharge status: Home / Self Care | Payer: Self-pay | Source: Ambulatory Visit | Attending: Physician Assistant | Admitting: Physician Assistant

## 2015-01-20 ENCOUNTER — Inpatient Hospital Stay
Admission: AD | Admit: 2015-01-20 | Discharge: 2015-01-24 | DRG: 374 | Disposition: A | Discharge status: Home Health | Payer: Self-pay | Source: Ambulatory Visit | Attending: Internal Medicine | Admitting: Internal Medicine

## 2015-01-20 ENCOUNTER — Other Ambulatory Visit: Payer: Self-pay | Admitting: Physician Assistant

## 2015-01-20 DIAGNOSIS — Z8551 Personal history of malignant neoplasm of bladder: Secondary | ICD-10-CM

## 2015-01-20 DIAGNOSIS — N183 Chronic kidney disease, stage 3 unspecified: Secondary | ICD-10-CM | POA: Diagnosis present

## 2015-01-20 DIAGNOSIS — I129 Hypertensive chronic kidney disease with stage 1 through stage 4 chronic kidney disease, or unspecified chronic kidney disease: Secondary | ICD-10-CM | POA: Diagnosis present

## 2015-01-20 DIAGNOSIS — I959 Hypotension, unspecified: Secondary | ICD-10-CM | POA: Diagnosis present

## 2015-01-20 DIAGNOSIS — R1310 Dysphagia, unspecified: Secondary | ICD-10-CM

## 2015-01-20 DIAGNOSIS — Z960 Presence of urogenital implants: Secondary | ICD-10-CM | POA: Diagnosis present

## 2015-01-20 DIAGNOSIS — R1319 Other dysphagia: Secondary | ICD-10-CM | POA: Insufficient documentation

## 2015-01-20 DIAGNOSIS — Z8249 Family history of ischemic heart disease and other diseases of the circulatory system: Secondary | ICD-10-CM

## 2015-01-20 DIAGNOSIS — F17210 Nicotine dependence, cigarettes, uncomplicated: Secondary | ICD-10-CM | POA: Diagnosis present

## 2015-01-20 DIAGNOSIS — E860 Dehydration: Secondary | ICD-10-CM | POA: Diagnosis present

## 2015-01-20 DIAGNOSIS — D509 Iron deficiency anemia, unspecified: Secondary | ICD-10-CM | POA: Diagnosis present

## 2015-01-20 DIAGNOSIS — K219 Gastro-esophageal reflux disease without esophagitis: Secondary | ICD-10-CM | POA: Diagnosis present

## 2015-01-20 DIAGNOSIS — R634 Abnormal weight loss: Secondary | ICD-10-CM | POA: Insufficient documentation

## 2015-01-20 DIAGNOSIS — C154 Malignant neoplasm of middle third of esophagus: Principal | ICD-10-CM | POA: Diagnosis present

## 2015-01-20 DIAGNOSIS — K2289 Other specified disease of esophagus: Secondary | ICD-10-CM | POA: Diagnosis present

## 2015-01-20 DIAGNOSIS — R64 Cachexia: Secondary | ICD-10-CM | POA: Diagnosis present

## 2015-01-20 DIAGNOSIS — Z79899 Other long term (current) drug therapy: Secondary | ICD-10-CM

## 2015-01-20 DIAGNOSIS — Z906 Acquired absence of other parts of urinary tract: Secondary | ICD-10-CM

## 2015-01-20 DIAGNOSIS — N529 Male erectile dysfunction, unspecified: Secondary | ICD-10-CM | POA: Diagnosis present

## 2015-01-20 DIAGNOSIS — E870 Hyperosmolality and hypernatremia: Secondary | ICD-10-CM | POA: Diagnosis present

## 2015-01-20 DIAGNOSIS — K222 Esophageal obstruction: Secondary | ICD-10-CM | POA: Diagnosis present

## 2015-01-20 DIAGNOSIS — C159 Malignant neoplasm of esophagus, unspecified: Secondary | ICD-10-CM

## 2015-01-20 DIAGNOSIS — C679 Malignant neoplasm of bladder, unspecified: Secondary | ICD-10-CM | POA: Diagnosis present

## 2015-01-20 DIAGNOSIS — K228 Other specified diseases of esophagus: Secondary | ICD-10-CM | POA: Diagnosis present

## 2015-01-20 DIAGNOSIS — E43 Unspecified severe protein-calorie malnutrition: Secondary | ICD-10-CM | POA: Diagnosis present

## 2015-01-20 HISTORY — DX: Essential (primary) hypertension: I10

## 2015-01-20 LAB — CBC
HCT: 29.6 % — ABNORMAL LOW (ref 40.0–52.0)
HEMOGLOBIN: 9.5 g/dL — AB (ref 13.0–18.0)
MCH: 27.8 pg (ref 26.0–34.0)
MCHC: 32.2 g/dL (ref 32.0–36.0)
MCV: 86.3 fL (ref 80.0–100.0)
Platelets: 241 10*3/uL (ref 150–440)
RBC: 3.44 MIL/uL — AB (ref 4.40–5.90)
RDW: 15 % — ABNORMAL HIGH (ref 11.5–14.5)
WBC: 8.5 10*3/uL (ref 3.8–10.6)

## 2015-01-20 LAB — BASIC METABOLIC PANEL
Anion gap: 8 (ref 5–15)
BUN: 24 mg/dL — AB (ref 6–20)
CHLORIDE: 111 mmol/L (ref 101–111)
CO2: 24 mmol/L (ref 22–32)
Calcium: 9.1 mg/dL (ref 8.9–10.3)
Creatinine, Ser: 2.16 mg/dL — ABNORMAL HIGH (ref 0.61–1.24)
GFR calc Af Amer: 36 mL/min — ABNORMAL LOW (ref >60)
GFR calc non Af Amer: 31 mL/min — ABNORMAL LOW (ref >60)
Glucose, Bld: 88 mg/dL (ref 65–99)
POTASSIUM: 3.5 mmol/L (ref 3.5–5.1)
SODIUM: 143 mmol/L (ref 135–145)

## 2015-01-20 LAB — PHOSPHORUS: Phosphorus: 2.3 mg/dL — ABNORMAL LOW (ref 2.5–4.6)

## 2015-01-20 LAB — PROTIME-INR
INR: 1.02
Prothrombin Time: 13.6 seconds (ref 11.4–15.0)

## 2015-01-20 LAB — IRON AND TIBC
IRON: 40 ug/dL — AB (ref 45–182)
SATURATION RATIOS: 26 % (ref 17.9–39.5)
TIBC: 153 ug/dL — AB (ref 250–450)
UIBC: 113 ug/dL

## 2015-01-20 LAB — FERRITIN: Ferritin: 231 ng/mL (ref 24–336)

## 2015-01-20 LAB — MAGNESIUM: MAGNESIUM: 1.9 mg/dL (ref 1.7–2.4)

## 2015-01-20 MED ORDER — ONDANSETRON HCL 4 MG PO TABS: 4.0000 mg | ORAL_TABLET | Freq: Four times a day (QID) | ORAL | Status: DC | PRN

## 2015-01-20 MED ORDER — PANTOPRAZOLE SODIUM 40 MG PO TBEC: 40.0000 mg | DELAYED_RELEASE_TABLET | Freq: Every day | ORAL | Status: DC

## 2015-01-20 MED ORDER — ONDANSETRON HCL 4 MG/2ML IJ SOLN: 4.0000 mg | Freq: Four times a day (QID) | INTRAMUSCULAR | Status: DC | PRN

## 2015-01-20 MED ORDER — ACETAMINOPHEN 325 MG PO TABS: 650.0000 mg | ORAL_TABLET | Freq: Four times a day (QID) | ORAL | Status: DC | PRN

## 2015-01-20 MED ORDER — SODIUM CHLORIDE 0.9 % IV SOLN
INTRAVENOUS | Status: DC
  Administered 2015-01-20 – 2015-01-22 (×4): via INTRAVENOUS

## 2015-01-20 MED ORDER — AMLODIPINE BESYLATE 10 MG PO TABS
10.0000 mg | ORAL_TABLET | Freq: Every day | ORAL | Status: DC
  Administered 2015-01-20: 16:00:00 10 mg via ORAL
  Filled 2015-01-20: qty 1

## 2015-01-20 MED ORDER — PANTOPRAZOLE SODIUM 40 MG PO TBEC
40.0000 mg | DELAYED_RELEASE_TABLET | Freq: Every day | ORAL | Status: DC
  Administered 2015-01-20: 40 mg via ORAL
  Filled 2015-01-20: qty 1

## 2015-01-20 MED ORDER — AMLODIPINE BESYLATE 5 MG PO TABS: 5.0000 mg | ORAL_TABLET | Freq: Every day | ORAL | Status: DC

## 2015-01-20 MED ORDER — ACETAMINOPHEN 650 MG RE SUPP: 650.0000 mg | Freq: Four times a day (QID) | RECTAL | Status: DC | PRN

## 2015-01-20 MED ORDER — ENOXAPARIN SODIUM 40 MG/0.4ML ~~LOC~~ SOLN
40.0000 mg | SUBCUTANEOUS | Status: DC
  Administered 2015-01-20: 22:00:00 40 mg via SUBCUTANEOUS
  Filled 2015-01-20: qty 0.4

## 2015-01-20 MED ORDER — OXYCODONE HCL 5 MG PO TABS: 5.0000 mg | ORAL_TABLET | ORAL | Status: DC | PRN

## 2015-01-20 NOTE — Plan of Care (Signed)
Problem: Discharge Progression Outcomes Goal: Discharge plan in place and appropriate Individualization: Pt is a low fall risk. Ambulates independently. Offer toileting and assist with safety checks and as needed. H/o HTN controlled with meds. H/o bladder cancer/bladder removal. Recently hospitalized at [facility] from 10/9-10/17 for bladder surgery per pt. Pt admitted with a foley. Foley placed post surgery at [facility] per pt. Goal: Activity appropriate for discharge plan Outcome: Progressing Direct admit from [facility]. VSS. NPO except ice, sips and meds. IVF infusing.Pt denies pain/n/v. Up in the room independently.

## 2015-01-20 NOTE — Consult Note (Signed)
GI Inpatient Consult Note  Reason for Consult: Dysphagia    History of Present Illness:  Jeffrey [name] is a 64 y.o. male was recently hospitalized at [facility] from 10/9- 01/19/2105 due to bladder cancer and ensuing surgery. He reports he feels that he is recovering well from his operation. He has a significant PMH of transitional cell carcinoma of bladder, bladder resection 2013.  He reports that he has had difficulty swallowing over the past month, starting with solid foods and now progressing to having a hard time getting liquids to go down. He is able to continue to take his meds.  He reports only having a gatorade and a boost yesterday. He reports a significant weight loss in the past 4 months, approx 35 pounds.  Prior to this he denies dysphagia. He has never had an EGD before.  He does have a significant smoking history, 48 + pack years.  He was seen in our office this morning, I sent him for a barium swallow stat from our office:   FINDINGS: Cervical esophagus appears normal. A along segment high-grade irregular stricture noted the mid thoracic esophagus. This is worrisome for esophageal malignancy. This results in partial obstruction of the esophagus. Endoscopic evaluation suggested. Incidental note is made of free intraperitoneal air, this is most likely from the patient's recent surgery. Clinical correlation suggested.  On review of results, Dr. Gustavo Lah was consulted and recommended he be directly admitted for further evaluation.  He self reports a prior colonoscopy in 2010 with polyps, we do not have a record of this. We do show a colon completed in 2007 with finding of diverticula. No significant family history of colon or rectal cancer, or colon polyps.   Past Medical History:  Past Medical History  Diagnosis Date  . Hypertension     Problem List: Patient Active Problem List   Diagnosis Date Noted  . Dysphagia 01/20/2015    Past Surgical History: Past  Surgical History  Procedure Laterality Date  . Bladder removal      Allergies: Allergies  Allergen Reactions  . Aspirin Nausea Only    Home Medications: Prescriptions prior to admission  Medication Sig Dispense Refill Last Dose  . amLODipine (NORVASC) 5 MG tablet Take 5 mg by mouth daily.   01/20/2015 at Unknown time  . pantoprazole (PROTONIX) 40 MG tablet Take 40 mg by mouth daily.   01/20/2015 at Unknown time   Home medication reconciliation was completed with the patient.   Scheduled Inpatient Medications:   . amLODipine  10 mg Oral Daily  . enoxaparin (LOVENOX) injection  40 mg Subcutaneous Q24H  . pantoprazole  40 mg Oral Daily    Continuous Inpatient Infusions:   . sodium chloride      PRN Inpatient Medications:  acetaminophen **OR** acetaminophen, ondansetron **OR** ondansetron (ZOFRAN) IV, oxyCODONE  Family History: family history includes Hypertension in his other.    Social History:   reports that he has been smoking Cigarettes.  He has been smoking about 0.50 packs per day. His smokeless tobacco use includes Chew. He reports that he does not drink alcohol or use illicit drugs.   Review of Systems: Constitutional: Positive for weight loss.  Eyes: No changes in vision. ENT: No oral lesions, sore throat.  GI: see HPI.  Heme/Lymph: No easy bruising.  CV: No chest pain.  GU: No hematuria.  Integumentary: No rashes.  Neuro: No headaches.  Psych: No depression/anxiety.  Endocrine: No heat/cold intolerance.  Allergic/Immunologic: No urticaria.  Resp: No  cough, SOB.  Musculoskeletal: No joint swelling.    Physical Examination: BP 133/84 mmHg  Pulse 69  Temp(Src) 98.2 F (36.8 C) (Oral)  Resp 20  Ht 5\' 7"  (1.702 m)  Wt 55.055 kg (121 lb 6 oz)  BMI 19.01 kg/m2  SpO2 99% Gen: NAD, alert and oriented x 4. Very thin, chronically ill appearing.  Wife was present and helped with history. HEENT: PEERLA, EOMI, Neck: supple, no JVD or thyromegaly Chest: CTA  bilaterally, no wheezes, crackles, or other adventitious sounds CV: RRR, no m/g/c/r Abd: soft, NT, ND, +BS in all four quadrants; no HSM, guarding, ridigity, or rebound tenderness Ext: no edema, well perfused with 2+ pulses, Skin: no rash or lesions noted Lymph: no LAD  Data: Lab Results  Component Value Date   WBC 12.6* 08/30/2012   HGB 9.7* 08/30/2012   HCT 28.3* 08/30/2012   MCV 96 08/30/2012   PLT 359 08/30/2012   No results for input(s): HGB in the last 168 hours. Lab Results  Component Value Date   NA 139 09/11/2014   K 4.1 09/11/2014   CL 116* 09/11/2014   CO2 17* 09/11/2014   BUN 48* 09/11/2014   CREATININE 2.86* 09/11/2014   Lab Results  Component Value Date   ALT 10* 08/25/2012   AST 11* 08/25/2012   ALKPHOS 56 08/25/2012   BILITOT 0.3 08/25/2012   No results for input(s): APTT, INR, PTT in the last 168 hours.   Assessment/Plan: [name] is a 64 y.o. male with acute dysphagia.  Unable to tolerate solids in the past three weeks and now unable to tolerate liquids.  Is able to tolerate meds.  Significant history of bladder carcinoma, 48+ pack years, current every day smoker.  Recommendations: We recommend a CT chest be completed when clinically feasible.  We will proceed with EGD pending review of CT. We also recommend he be started on liquid supplements until we are able to perform EGD. He has a history of IDA, currently microcytic anemia, unsure if he is taking iron. We recommend iron/ferritin panel along with PT/INR as well.  We will continue to follow with you. Thank you for the consult. Please call with questions or concerns.  Salvadore Farber, PA-C  I personally performed these services.

## 2015-01-20 NOTE — H&P (Signed)
[place] [place] [facility] [place] NAME: Jeffrey [name]    MR#:  [id number]  DATE OF BIRTH:  [birth year]  DATE OF ADMISSION:  01/20/2015  PRIMARY CARE PHYSICIAN: Tama High III, MD   REQUESTING/REFERRING PHYSICIAN: Dr Gustavo Lah  CHIEF COMPLAINT:  Difficulty swallowing for last 1 month Intermittent periods of vomiting  HISTORY OF PRESENT ILLNESS:  Jeffrey [name]  is a 64 y.o. male with a known history of transitional cell bladder cancer status post multiple surgeries in the remote past and recent lesion off his urostomy at [facility], hypertension, history of erectile dysfunction, is admitted from  GI clinic due to ongoing problems with dysphagia and abnormal barium swallow suggestive of high-grade malignant stricture. Patient has been losing weight secondary to dysphagia and intermittent vomiting. He is being admitted for further nausea management.  PAST MEDICAL HISTORY:   Past Medical History  Diagnosis Date  . Hypertension     PAST SURGICAL HISTOIRY:   Past Surgical History  Procedure Laterality Date  . Bladder removal      SOCIAL HISTORY:   Social History  Substance Use Topics  . Smoking status: Current Every Day Smoker -- 0.50 packs/day    Types: Cigarettes  . Smokeless tobacco: Current User    Types: Chew  . Alcohol Use: No    FAMILY HISTORY:   Family History  Problem Relation Age of Onset  . Hypertension Other     DRUG ALLERGIES:   Allergies  Allergen Reactions  . Aspirin Nausea Only    REVIEW OF SYSTEMS:  Review of Systems  Constitutional: Positive for weight loss. Negative for fever and chills.  HENT: Negative for ear discharge, ear pain and nosebleeds.   Eyes: Negative for blurred vision, pain and discharge.  Respiratory: Negative for sputum production, shortness of breath, wheezing and stridor.   Cardiovascular: Negative for chest pain, palpitations, orthopnea and PND.  Gastrointestinal: Positive for  vomiting. Negative for nausea, abdominal pain and diarrhea.  Genitourinary: Negative for urgency and frequency.  Musculoskeletal: Negative for back pain and joint pain.  Neurological: Positive for weakness. Negative for sensory change, speech change and focal weakness.  Psychiatric/Behavioral: Negative for depression and hallucinations. The patient is not nervous/anxious.      MEDICATIONS AT HOME:   Prior to Admission medications   Medication Sig Start Date End Date Taking? Authorizing Provider  amLODipine (NORVASC) 5 MG tablet Take 5 mg by mouth daily.   Yes Historical Provider, MD  pantoprazole (PROTONIX) 40 MG tablet Take 40 mg by mouth daily.   Yes Historical Provider, MD      VITAL SIGNS:  Blood pressure 133/84, pulse 69, temperature 98.2 F (36.8 C), temperature source Oral, resp. rate 20, height 5\' 7"  (1.702 m), weight 55.055 kg (121 lb 6 oz), SpO2 99 %.  PHYSICAL EXAMINATION:  GENERAL:  64 y.o.-year-old patient lying in the bed with no acute distress. Cachectic thin EYES: Pupils equal, round, reactive to light and accommodation. No scleral icterus. Extraocular muscles intact.  HEENT: Head atraumatic, normocephalic. Oropharynx and nasopharynx clear.  NECK:  Supple, no jugular venous distention. No thyroid enlargement, no tenderness.  LUNGS: Normal breath sounds bilaterally, no wheezing, rales,rhonchi or crepitation. No use of accessory muscles of respiration.  CARDIOVASCULAR: S1, S2 normal. No murmurs, rubs, or gallops.  ABDOMEN: Soft, nontender, nondistended. Bowel sounds present. No organomegaly or mass. Indwelling Foley catheter this was recently placed at [facility] post surgery EXTREMITIES: No pedal edema, cyanosis, or clubbing.  NEUROLOGIC: Cranial nerves II through XII are intact. Muscle strength 5/5 in all extremities. Sensation intact. Gait not checked.  PSYCHIATRIC: The patient is alert and oriented x 3.  SKIN: No obvious rash, lesion, or ulcer.   LABORATORY PANEL:    CBC No results for input(s): WBC, HGB, HCT, PLT in the last 168 hours. ------------------------------------------------------------------------------------------------------------------  Chemistries  No results for input(s): NA, K, CL, CO2, GLUCOSE, BUN, CREATININE, CALCIUM, MG, AST, ALT, ALKPHOS, BILITOT in the last 168 hours.  Invalid input(s): GFRCGP ------------------------------------------------------------------------------------------------------------------  Cardiac Enzymes No results for input(s): TROPONINI in the last 168 hours. ------------------------------------------------------------------------------------------------------------------  RADIOLOGY:  Dg Esophagus  01/20/2015  CLINICAL DATA:  Dysphasia. EXAM: ESOPHOGRAM/BARIUM SWALLOW TECHNIQUE: Single contrast examination was performed using  thick barium. FLUOROSCOPY TIME:  Radiation Exposure Index (as provided by the fluoroscopic device): 18.9 mGy COMPARISON:  Chest x-ray 08/29/2012. FINDINGS: Cervical esophagus appears normal. A along segment high-grade irregular stricture noted the mid thoracic esophagus. This is worrisome for esophageal malignancy. This results in partial obstruction of the esophagus. Endoscopic evaluation suggested. Incidental note is made of free intraperitoneal air, this is most likely from the patient's recent surgery. Clinical correlation suggested. IMPRESSION: 1. 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 This is worrisome for esophageal malignancy. 2. Free intraperitoneal air, most likely from patient's recent surgery. Critical Value/emergent results were called by telephone at the time of interpretation on 01/20/2015 at 11:47 am to [facility] , who verbally acknowledged these results. Electronically Signed   By: Marcello Moores  Register   On: 01/20/2015 11:52    IMPRESSION AND PLAN:   Jeffrey [name]  is a 64 y.o. male with a known history of transitional cell  bladder cancer status post multiple surgeries in the remote past and recent lesion off his urostomy at [facility], hypertension, history of erectile dysfunction, is admitted from  GI clinic due to ongoing problems with dysphagia and abnormal barium swallow suggestive of high-grade malignant stricture.  1. Dysphagia acute onset for about a month with on and off vomiting and weight loss Barium swallow suggestive of malignant stricture GI consultation by Dr Dickie La Nothing by mouth except meds and ice chips IV fluids for hydration   EGD planned likely in a.m. for GI  2. Cachexia weight loss and malnutrition appears severe Dietitian to see  3. Hypertension Resume amlodipine  4. GERD on Protonix  5. History of chronic kidney disease. Creatinine at baseline is 1.5-1.6 Will continue IV fluids maintenance for now. Metabolic panel has been ordered.   All the records are reviewed and case discussed with ED provider. Management plans discussed with the patient, family and they are in agreement. Case discussed with GI  CODE STATUS: Full   TOTAL TIME TAKING CARE OF THIS PATIENT: 45  minutes.    Jeffrey [name] M.D on 01/20/2015 at 2:50 PM  Between 7am to 6pm - Pager - [phone]  After 6pm go to [email or web address] - password [email or web address] [facility]  Jeffrey [name]  Office  [phone]  CC: Primary care physician; Adin Hector, MD

## 2015-01-20 NOTE — Consult Note (Signed)
Please see full GI consult by Ms. Richards. Patient seen and examined, chart reviewed. Asian having progressive dysphagia least several months. Is no previous history of peptic ulcer disease. He is a smoker. He has had multiple months of weight loss. There is also a relatively recent diagnosis of transitional cell bladder cancer for which she has had 2 procedures. He denies any history of heartburn. Barium swallow was done today showing a mid esophageal lesion at least 4-5 cm. There is only a trickle of barium getting through that region. It is not possible to tell local extent on that study though I suspicion there are at least multiple nodes in the region. CT scan could not be done today due to the contrast being in place. I'll order a daily at bedtime film tomorrow morning and hopefully can do the CT scan in the morning with the EGD in the afternoon. I will place him on the EGD scheduled tentatively for tomorrow afternoon. Need to be done with anesthesia assistance. Will check clotting parameters.

## 2015-01-21 ENCOUNTER — Inpatient Hospital Stay: Payer: Self-pay

## 2015-01-21 ENCOUNTER — Encounter: Payer: Self-pay | Admitting: *Deleted

## 2015-01-21 ENCOUNTER — Inpatient Hospital Stay: Payer: Self-pay | Admitting: Anesthesiology

## 2015-01-21 ENCOUNTER — Encounter
Admission: AD | Disposition: A | Discharge status: Home Health | Payer: Self-pay | Source: Ambulatory Visit | Attending: Internal Medicine

## 2015-01-21 DIAGNOSIS — D649 Anemia, unspecified: Secondary | ICD-10-CM

## 2015-01-21 DIAGNOSIS — E43 Unspecified severe protein-calorie malnutrition: Secondary | ICD-10-CM | POA: Insufficient documentation

## 2015-01-21 DIAGNOSIS — R5383 Other fatigue: Secondary | ICD-10-CM

## 2015-01-21 DIAGNOSIS — E860 Dehydration: Secondary | ICD-10-CM | POA: Diagnosis present

## 2015-01-21 DIAGNOSIS — I129 Hypertensive chronic kidney disease with stage 1 through stage 4 chronic kidney disease, or unspecified chronic kidney disease: Secondary | ICD-10-CM

## 2015-01-21 DIAGNOSIS — N183 Chronic kidney disease, stage 3 unspecified: Secondary | ICD-10-CM | POA: Diagnosis present

## 2015-01-21 DIAGNOSIS — Z8551 Personal history of malignant neoplasm of bladder: Secondary | ICD-10-CM

## 2015-01-21 DIAGNOSIS — R1310 Dysphagia, unspecified: Secondary | ICD-10-CM

## 2015-01-21 DIAGNOSIS — R64 Cachexia: Secondary | ICD-10-CM

## 2015-01-21 DIAGNOSIS — K2289 Other specified disease of esophagus: Secondary | ICD-10-CM | POA: Diagnosis present

## 2015-01-21 DIAGNOSIS — R221 Localized swelling, mass and lump, neck: Secondary | ICD-10-CM

## 2015-01-21 DIAGNOSIS — K228 Other specified diseases of esophagus: Secondary | ICD-10-CM | POA: Diagnosis present

## 2015-01-21 DIAGNOSIS — F17210 Nicotine dependence, cigarettes, uncomplicated: Secondary | ICD-10-CM

## 2015-01-21 DIAGNOSIS — C679 Malignant neoplasm of bladder, unspecified: Secondary | ICD-10-CM | POA: Diagnosis present

## 2015-01-21 DIAGNOSIS — R1110 Vomiting, unspecified: Secondary | ICD-10-CM

## 2015-01-21 HISTORY — PX: ESOPHAGOGASTRODUODENOSCOPY (EGD) WITH PROPOFOL: SHX5813

## 2015-01-21 SURGERY — ESOPHAGOGASTRODUODENOSCOPY (EGD) WITH PROPOFOL
Anesthesia: General

## 2015-01-21 MED ORDER — MORPHINE SULFATE (PF) 2 MG/ML IV SOLN: 1.0000 mg | INTRAVENOUS | Status: DC | PRN

## 2015-01-21 MED ORDER — MORPHINE SULFATE (PF) 2 MG/ML IV SOLN: 2.0000 mg | INTRAVENOUS | Status: DC | PRN

## 2015-01-21 MED ORDER — EPHEDRINE SULFATE 50 MG/ML IJ SOLN
INTRAMUSCULAR | Status: DC | PRN
  Administered 2015-01-21 (×2): 5 mg via INTRAVENOUS
  Administered 2015-01-21: 10 mg via INTRAVENOUS
  Administered 2015-01-21: 5 mg via INTRAVENOUS

## 2015-01-21 MED ORDER — ENOXAPARIN SODIUM 30 MG/0.3ML ~~LOC~~ SOLN: 30.0000 mg | SUBCUTANEOUS | Status: DC

## 2015-01-21 MED ORDER — MIDAZOLAM HCL 2 MG/2ML IJ SOLN
INTRAMUSCULAR | Status: DC | PRN
  Administered 2015-01-21: 1 mg via INTRAVENOUS

## 2015-01-21 MED ORDER — SUCCINYLCHOLINE CHLORIDE 20 MG/ML IJ SOLN
INTRAMUSCULAR | Status: DC | PRN
  Administered 2015-01-21: 100 mg via INTRAVENOUS

## 2015-01-21 MED ORDER — PANTOPRAZOLE SODIUM 40 MG IV SOLR
40.0000 mg | Freq: Two times a day (BID) | INTRAVENOUS | Status: DC
  Administered 2015-01-21 – 2015-01-22 (×4): 40 mg via INTRAVENOUS
  Filled 2015-01-21 (×4): qty 40

## 2015-01-21 MED ORDER — PROPOFOL 10 MG/ML IV BOLUS
INTRAVENOUS | Status: DC | PRN
  Administered 2015-01-21: 150 mg via INTRAVENOUS

## 2015-01-21 MED ORDER — FENTANYL CITRATE (PF) 100 MCG/2ML IJ SOLN
INTRAMUSCULAR | Status: DC | PRN
  Administered 2015-01-21: 50 ug via INTRAVENOUS

## 2015-01-21 MED ORDER — LIDOCAINE HCL (CARDIAC) 20 MG/ML IV SOLN
INTRAVENOUS | Status: DC | PRN
  Administered 2015-01-21: 50 mg via INTRAVENOUS

## 2015-01-21 MED ORDER — FENTANYL CITRATE (PF) 100 MCG/2ML IJ SOLN: 25.0000 ug | INTRAMUSCULAR | Status: DC | PRN

## 2015-01-21 MED ORDER — ONDANSETRON HCL 4 MG/2ML IJ SOLN
4.0000 mg | Freq: Once | INTRAMUSCULAR | Status: DC | PRN
Start: 2015-01-21 — End: 2015-01-22

## 2015-01-21 NOTE — Consult Note (Signed)
Please see EGD report.  Obstructing mass in the mid esophagus.  Biopsies taken, soul be available tomorrow pm.  Unable to pass scope beyond mid esophagus. I have asked for Oncology consult, have discussed with Dr Yevette Edwards. He will likely need feeding tube, not able to do endoscopically, but recommend Interventional Radiology to place versus surgical placement.    I wll not be available until Monday, one of my partners will rouond for GI.  Dilscussed with patient and patient wife.

## 2015-01-21 NOTE — Anesthesia Preprocedure Evaluation (Signed)
Anesthesia Evaluation  Patient identified by MRN, date of birth, ID band Patient awake    Reviewed: Allergy & Precautions, NPO status , Patient's Chart, lab work & pertinent test results, reviewed documented beta blocker date and time   Airway Mallampati: II  TM Distance: >3 FB     Dental  (+) Chipped, Edentulous Lower, Edentulous Upper, Poor Dentition, Dental Advisory Given   Pulmonary Current Smoker,           Cardiovascular hypertension, Pt. on medications      Neuro/Psych    GI/Hepatic   Endo/Other    Renal/GU Renal InsufficiencyRenal disease     Musculoskeletal   Abdominal   Peds  Hematology   Anesthesia Other Findings   Reproductive/Obstetrics                             Anesthesia Physical Anesthesia Plan  ASA: IV  Anesthesia Plan: General   Post-op Pain Management:    Induction: Rapid sequence and Intravenous  Airway Management Planned: Oral ETT  Additional Equipment:   Intra-op Plan:   Post-operative Plan:   Informed Consent: I have reviewed the patients History and Physical, chart, labs and discussed the procedure including the risks, benefits and alternatives for the proposed anesthesia with the patient or authorized representative who has indicated his/her understanding and acceptance.     Plan Discussed with: CRNA  Anesthesia Plan Comments:         Anesthesia Quick Evaluation

## 2015-01-21 NOTE — Plan of Care (Signed)
Problem: Discharge Progression Outcomes Goal: Activity appropriate for discharge plan Plan of care progress to goal: - No complaints of pain. - Continues IV fluids. - Scheduled for an chest x-ray, CT scan and EGD today, NPO start at 0700 this am. Will continue to monitor.

## 2015-01-21 NOTE — Op Note (Addendum)
[facility] Patient Name: Jeffrey [name] Procedure Date: 01/21/2015 2:08 PM MRN: [id number] Account #: [id number] Date of Birth: Apr 04, 1951 Admit Type: Inpatient Age: 64 Room: [facility] ENDO ROOM 4 Gender: Male Note Status: Supervisor Override Procedure:         Upper GI endoscopy Indications:       Dysphagia, Abnormal UGI series Providers:         [name], MD Referring MD:      Ramonita Lab, MD (Referring MD) Medicines:         General Anesthesia Complications:     No immediate complications. Procedure:         Pre-Anesthesia Assessment:                    - ASA Grade Assessment: IV - A patient with severe                     systemic disease that is a constant threat to life.                    After obtaining informed consent, the endoscope was passed                     under direct vision. Throughout the procedure, the                     patient's blood pressure, pulse, and oxygen saturations                     were monitored continuously. The Endoscope was introduced                     through the mouth, and advanced to the middle third of                     esophagus. The upper GI endoscopy was performed with                     moderate difficulty due to a partially obstructing mass.                     The patient tolerated the procedure well. Findings:      The endoscope was carefully passed to about the mid esophagus. There was       a large amount ot baruim and mucus with possible food debris. Multiple       rinses were done , this material being very difficult to clear. Some       pill and food debris was removed with a roth net, There is friable       tissue beneath this material. Some of this material is likely       eschar/debris also from the underlying mass. 3 passes were taken with a       cold forcep to obtain tissue from the lesion. Each site was observed to       assure/evaluate hemostasis, which was good. A very  narrow area on one       side of the mass was noted, possibly the narrow opening noted on       imaging, however due to the nature of the lesion I did not attempt to       pass this. Impression:        - No specimens collected. Recommendation:    -  Await pathology results. [name], MD 01/21/2015 3:39:28 PM This report has been signed electronically. Number of Addenda: 0 Note Initiated On: 01/21/2015 2:08 PM      [facility]

## 2015-01-21 NOTE — Progress Notes (Signed)
Initial Nutrition Assessment  DOCUMENTATION CODES:   Severe malnutrition in context of acute illness/injury  INTERVENTION:   Coordination of Care: await diet progression as medically able and will follow poc Medical Food Supplement Therapy: will recommend Ensure Enlive po TID, each supplement provides 350 kcal and 20 grams of protein, once diet order able to be advanced   NUTRITION DIAGNOSIS:   Malnutrition related to altered GI function, acute illness as evidenced by percent weight loss, energy intake < or equal to 50% for > or equal to 5 days, moderate depletion of body fat, moderate depletions of muscle mass.  GOAL:   Patient will meet greater than or equal to 90% of their needs  MONITOR:    (Energy Intake, Electrolyte and Renal Profile, Anthropometrics, Digestive System)  REASON FOR ASSESSMENT:   Consult, Malnutrition Screening Tool Assessment of nutrition requirement/status  ASSESSMENT:   Pt admitted with dysphagia with possible esophageal mass concerning for malignancy per MD note; pt scheduled for EGD 10/19. Pt with recent admission 10/9-10/17 at [facility] with reimplantation of right ureter with h/o bladder cancer with prior bladder resection.    Past Medical History  Diagnosis Date  . Hypertension    Past Surgical History  Procedure Laterality Date  . Bladder removal     Diet Order:  Diet NPO time specified   Current Nutrition: Pt NPO currently  Food/Nutrition-Related History: Pt reports drinking one Boost and Gatorade yesterday. Pt reports only tolerating Boost/Ensure and sometimes cereal or ice cream 'if it would go down' for the past month PTA. Pt reports drinking usually 2 Boost per day. Pt reports having very little while at [facility] last week as well secondary to surgery and recovery per pt.   Scheduled Medications:  . pantoprazole (PROTONIX) IV  40 mg Intravenous Q12H    Continuous Medications:  . sodium chloride 75 mL/hr at 01/21/15 0411      Electrolyte/Renal Profile and Glucose Profile:   Recent Labs Lab 01/20/15 1600  NA 143  K 3.5  CL 111  CO2 24  BUN 24*  CREATININE 2.16*  CALCIUM 9.1  MG 1.9  PHOS 2.3*  GLUCOSE 88   Protein Profile: No results for input(s): ALBUMIN in the last 168 hours.  Gastrointestinal Profile: Last BM:  01/20/2015   Nutrition-Focused Physical Exam Findings: Nutrition-Focused physical exam completed. Findings are mild-moderate fat depletion, moderate-severe muscle depletion, and no edema.     Weight Change: Pt reports weight loss of 30lbs in the past month and reports weight of 160lbs 2 months ago (25% weight loss in 2 months)  Skin:   surgical incision noted  Height:   Ht Readings from Last 1 Encounters:  01/20/15 5\' 7"  (1.702 m)    Weight:   Wt Readings from Last 1 Encounters:  01/20/15 121 lb 6 oz (55.055 kg)     BMI:  Body mass index is 19.01 kg/(m^2).  Estimated Nutritional Needs:   Kcal:  BEE: 1304kcals, TEE: (IF 1.1-1.3)(AF 1.3) [id number]  Protein:  55-66g protein (1.0-1.2g/kg)  Fluid:  1375-1620mL of fluid (25-52mL/kg)  EDUCATION NEEDS:   Education needs no appropriate at this time   Mason, RD, LDN Pager [phone]

## 2015-01-21 NOTE — Anesthesia Procedure Notes (Signed)
Procedure Name: Intubation Performed by: Jonna Clark Pre-anesthesia Checklist: Patient identified, Emergency Drugs available, Suction available, Patient being monitored and Timeout performed Patient Re-evaluated:Patient Re-evaluated prior to inductionOxygen Delivery Method: Circle system utilized Preoxygenation: Pre-oxygenation with 100% oxygen Intubation Type: IV induction and Rapid sequence Grade View: Grade I Tube type: Oral Tube size: 7.0 mm Number of attempts: 1 Placement Confirmation: ETT inserted through vocal cords under direct vision,  positive ETCO2 and breath sounds checked- equal and bilateral Secured at: 21 cm Tube secured with: Tape Dental Injury: Teeth and Oropharynx as per pre-operative assessment

## 2015-01-21 NOTE — Consult Note (Signed)
Subjective:  Patient seen for patient seen for dysphagia with abnormal barium swallow.  Handling secretions and ice chips.  Denies nausea or abdominal pain. No chest pain.    Objective: Vital signs in last 24 hours: Temp:  [96.9 F (36.1 C)-98.7 F (37.1 C)] 96.9 F (36.1 C) (10/19 1250) Pulse Rate:  [54-70] 54 (10/19 1250) Resp:  [16-20] 16 (10/19 1250) BP: (108-133)/(66-84) 124/73 mmHg (10/19 1250) SpO2:  [99 %-100 %] 100 % (10/19 1250) Weight:  [55.055 kg (121 lb 6 oz)] 55.055 kg (121 lb 6 oz) (10/18 1332) Blood pressure 124/73, pulse 54, temperature 96.9 F (36.1 C), temperature source Tympanic, resp. rate 16, height 5\' 7"  (1.702 m), weight 55.055 kg (121 lb 6 oz), SpO2 100 %.   Intake/Output from previous day: 10/18 0701 - 10/19 0700 In: -  Out: 800 [Urine:800]  Intake/Output this shift: Total I/O In: -  Out: 300 [Urine:300]   General appearance:  Thin elderly appearing, no distress Resp:  Bilateral cta Cardio:  rrr without rum murmur or gallop GI:  Soft nd,  nt bs positive  Extremities:  No cce   Lab Results: Results for orders placed or performed during the hospital encounter of 01/20/15 (from the past 24 hour(s))  CBC     Status: Abnormal   Collection Time: 01/20/15  4:00 PM  Result Value Ref Range   WBC 8.5 3.8 - 10.6 K/uL   RBC 3.44 (L) 4.40 - 5.90 MIL/uL   Hemoglobin 9.5 (L) 13.0 - 18.0 g/dL   HCT 29.6 (L) 40.0 - 52.0 %   MCV 86.3 80.0 - 100.0 fL   MCH 27.8 26.0 - 34.0 pg   MCHC 32.2 32.0 - 36.0 g/dL   RDW 15.0 (H) 11.5 - 14.5 %   Platelets 241 150 - 440 K/uL  Basic metabolic panel     Status: Abnormal   Collection Time: 01/20/15  4:00 PM  Result Value Ref Range   Sodium 143 135 - 145 mmol/L   Potassium 3.5 3.5 - 5.1 mmol/L   Chloride 111 101 - 111 mmol/L   CO2 24 22 - 32 mmol/L   Glucose, Bld 88 65 - 99 mg/dL   BUN 24 (H) 6 - 20 mg/dL   Creatinine, Ser 2.16 (H) 0.61 - 1.24 mg/dL   Calcium 9.1 8.9 - 10.3 mg/dL   GFR calc non Af Amer 31 (L) >60  mL/min   GFR calc Af Amer 36 (L) >60 mL/min   Anion gap 8 5 - 15  Magnesium     Status: None   Collection Time: 01/20/15  4:00 PM  Result Value Ref Range   Magnesium 1.9 1.7 - 2.4 mg/dL  Phosphorus     Status: Abnormal   Collection Time: 01/20/15  4:00 PM  Result Value Ref Range   Phosphorus 2.3 (L) 2.5 - 4.6 mg/dL  Protime-INR     Status: None   Collection Time: 01/20/15  4:00 PM  Result Value Ref Range   Prothrombin Time 13.6 11.4 - 15.0 seconds   INR 1.02   Iron and TIBC     Status: Abnormal   Collection Time: 01/20/15  4:00 PM  Result Value Ref Range   Iron 40 (L) 45 - 182 ug/dL   TIBC 153 (L) 250 - 450 ug/dL   Saturation Ratios 26 17.9 - 39.5 %   UIBC 113 ug/dL  Ferritin     Status: None   Collection Time: 01/20/15  4:00 PM  Result Value Ref  Range   Ferritin 231 24 - 336 ng/mL      Recent Labs  01/20/15 1600  WBC 8.5  HGB 9.5*  HCT 29.6*  PLT 241   BMET  Recent Labs  01/20/15 1600  NA 143  K 3.5  CL 111  CO2 24  GLUCOSE 88  BUN 24*  CREATININE 2.16*  CALCIUM 9.1   LFT No results for input(s): PROT, ALBUMIN, AST, ALT, ALKPHOS, BILITOT, BILIDIR, IBILI in the last 72 hours. PT/INR  Recent Labs  01/20/15 1600  LABPROT 13.6  INR 1.02   Hepatitis Panel No results for input(s): HEPBSAG, HCVAB, HEPAIGM, HEPBIGM in the last 72 hours. C-Diff No results for input(s): CDIFFTOX in the last 72 hours. No results for input(s): CDIFFPCR in the last 72 hours.   Studies/Results: Dg Chest 2 View  01/21/2015  CLINICAL DATA:  Dysphagia. Follow-up contrast from barium swallow performed yesterday. Recent surgery for bladder cancer. EXAM: CHEST  2 VIEW COMPARISON:  01/20/2015 barium swallow FINDINGS: There is residual barium in the thoracic esophagus proximal to an irregular esophageal stricture as described on yesterday's barium swallow. Cardiac silhouette is within normal limits. The lungs are hyperinflated without evidence of airspace consolidation, edema,  pleural effusion, or pneumothorax. Free intraperitoneal air is again seen under the hemidiaphragms, similar to the prior study and likely related to recent surgery. Residual oral contrast is partially visualized in the splenic flexure of the colon. No acute osseous abnormality is seen. IMPRESSION: 1. Residual barium in the proximal to mid thoracic esophagus. 2. Clear lungs. 3. Unchanged intraperitoneal free air. Electronically Signed   By: Logan Bores M.D.   On: 01/21/2015 08:18   Dg Esophagus  01/20/2015  CLINICAL DATA:  Dysphasia. EXAM: ESOPHOGRAM/BARIUM SWALLOW TECHNIQUE: Single contrast examination was performed using  thick barium. FLUOROSCOPY TIME:  Radiation Exposure Index (as provided by the fluoroscopic device): 18.9 mGy COMPARISON:  Chest x-ray 08/29/2012. FINDINGS: Cervical esophagus appears normal. A along segment high-grade irregular stricture noted the mid thoracic esophagus. This is worrisome for esophageal malignancy. This results in partial obstruction of the esophagus. Endoscopic evaluation suggested. Incidental note is made of free intraperitoneal air, this is most likely from the patient's recent surgery. Clinical correlation suggested. IMPRESSION: 1. 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 This is worrisome for esophageal malignancy. 2. Free intraperitoneal air, most likely from patient's recent surgery. Critical Value/emergent results were called by telephone at the time of interpretation on 01/20/2015 at 11:47 am to [facility] , who verbally acknowledged these results. Electronically Signed   By: Marcello Moores  Register   On: 01/20/2015 11:52    Scheduled Inpatient Medications:   . pantoprazole (PROTONIX) IV  40 mg Intravenous Q12H    Continuous Inpatient Infusions:   . sodium chloride 75 mL/hr at 01/21/15 0411    PRN Inpatient Medications:  acetaminophen **OR** acetaminophen, morphine injection, ondansetron **OR** ondansetron (ZOFRAN)  IV  Miscellaneous:   Assessment:  1) dysphagia, abnormal barium swallow. Unable to do ct today due to residual barium.  Plan:  1) EGD today.   I have discussed the risks benefits and complications of procedures to include not limited to bleeding, infection, perforation and the risk of sedation and the patient wishes to proceed.  [name] MD 01/21/2015, 1:10 PM

## 2015-01-21 NOTE — Care Management (Signed)
Admitted to [facility] with the diagnosis of dysphagia. Lives with wife, Hassan Rowan, [phone]).  History of chronic kidney disease. Difficulty swallowing x 1 month. Scheduled for EGD today.  No insurance listed. Mr. Karnes has gone for his procedure and there are no family members present to discuss insurance. Telephone call to [employer]. Left voice mail.  Shelbie Ammons RN MSN Care Management [phone]

## 2015-01-21 NOTE — Progress Notes (Signed)
Patient ID: Jeffrey [name], male   DOB: 11/06/1950, 64 y.o.   MRN: [id number] SUBJECTIVE:  Pt admitted with progressive dysphagia with solids, with dehydration.  Had been seen in office 2 wks ago with this complaint, with labs showing dehydration.  Recommended hospitalization after lab findings; pt was scheduled to undergo reimplantation of right ureter into neobladder at [facility] and did not want to change this appt. GI appt set up as outpt.  He had been able to keep liquids/pills down, and so he kept [facility] appt and underwent procedure, with some post procedure/postprandial issues.  Presented to GI; underwent barium swallow with evidence of esophageal stricture/mass, highly suggestive of cancer.  Pt with h/o tobacco and ETOH abuse.  ______________________________________________________________________  ROS: Please see HPI; remainder of complete 10 point ROS is negative   Past Medical History  Diagnosis Date  . Hypertension     Past Surgical History  Procedure Laterality Date  . Bladder removal       Current facility-administered medications:  .  0.9 %  sodium chloride infusion, , Intravenous, Continuous, Fritzi Mandes, MD, Last Rate: 75 mL/hr at 01/21/15 0411 .  acetaminophen (TYLENOL) tablet 650 mg, 650 mg, Oral, Q6H PRN **OR** acetaminophen (TYLENOL) suppository 650 mg, 650 mg, Rectal, Q6H PRN, Fritzi Mandes, MD .  morphine 2 MG/ML injection 2 mg, 2 mg, Intravenous, Q2H PRN, Tama High III, MD .  ondansetron [facility]) tablet 4 mg, 4 mg, Oral, Q6H PRN **OR** ondansetron (ZOFRAN) injection 4 mg, 4 mg, Intravenous, Q6H PRN, Fritzi Mandes, MD .  pantoprazole (PROTONIX) injection 40 mg, 40 mg, Intravenous, Q12H, Tama High III, MD  PHYSICAL EXAM:  BP 114/66 mmHg  Pulse 62  Temp(Src) 98.7 F (37.1 C) (Oral)  Resp 18  Ht 5\' 7"  (1.702 m)  Wt 55.055 kg (121 lb 6 oz)  BMI 19.01 kg/m2  SpO2 100%  General: thin male, in NAD HEENT: PERRL; OP slightly dry without lesions. Neck: supple, trachea  midline, no thyromegaly Chest: slight hyperexpansion Lungs: clear bilaterally without retractions or wheezes Cardiovascular: RRR, no murmur, no gallop; distal pulses 2+ Abdomen: soft, nontender, nondistended, positive bowel sounds.  Post op site intact without erythema Extremities: no clubbing, cyanosis, edema Neuro: alert and oriented, moves all extremities Derm: no significant rashes or nodules; reduced skin turgor Lymph: no cervical or supraclavicular lymphadenopathy  Labs and imaging studies were reviewed  ASSESSMENT/PLAN:   1. Esophageal mass/dysphagia with solids/dehydration- appreciate GI input; to CT and EGD today.  Will need to be off anticoagulation for this.  Cont IVF support and convert meds to PO for now 2. CKD stage 3- baseline Cr is 2 3. Bladder cancer with prior bladder resection- pt s/p reimplantation of right ureter; follow exam of incision site. Follow urine output.  Pt's urine will be chronically contaminated.  4. HTN.  Hold amlodipine and follow BP for now due to relative hypotension

## 2015-01-21 NOTE — Consult Note (Signed)
[facility] CONSULT NOTE  Patient Care Team: Adin Hector, MD as PCP - General (Internal Medicine)  CHIEF COMPLAINTS/PURPOSE OF CONSULTATION:    HISTORY OF PRESENTING ILLNESS:  Jeffrey [name] 64 y.o.  male the history of bladder cancer status post resection and a neobladder [2012]; and also history of chronic kidney disease noted to have worsening dysphagia over the last month or so. He also complains of mild/moderate regurgitation. Denies any abdominal pain. He admits to weight loss of about 20 pounds in the last few months.  Patient underwent barium swallow that showed mid esophageal stricture; which was promptly worked up by GI with the scope this morning. Verbal report suggests esophageal mass; biopsy pending. Oncology has been consult for further recommendations.  Of note he recently underwent reimplantation of right ureter into the neobladder at [facility]. He currently has a Foley catheter in place.  He denies any pain. He denies any blood in stools black stools. Denies any unusual or chest pain. He does admit to fatigue. Denies any skin nodules.   ROS: A complete 10 point review of system is done which is negative except mentioned above in history of present illness  MEDICAL HISTORY:  Past Medical History  Diagnosis Date  . Hypertension     SURGICAL HISTORY: Past Surgical History  Procedure Laterality Date  . Bladder removal      SOCIAL HISTORY: Patient lives in [place] with his wife. Patient admits to smoking for many years quit 6 months ago. Denies any alcohol abuse. Social History   Social History  . Marital Status: Married    Spouse Name: N/A  . Number of Children: N/A  . Years of Education: N/A   Occupational History  . Not on file.   Social History Main Topics  . Smoking status: Current Every Day Smoker -- 0.50 packs/day    Types: Cigarettes  . Smokeless tobacco: Current User    Types: Chew  . Alcohol Use: No  . Drug Use: No  . Sexual  Activity: Not on file   Other Topics Concern  . Not on file   Social History Narrative    FAMILY HISTORY: Family History  Problem Relation Age of Onset  . Hypertension Other     ALLERGIES:  is allergic to aspirin.  MEDICATIONS:  Current Facility-Administered Medications  Medication Dose Route Frequency Provider Last Rate Last Dose  . 0.9 %  sodium chloride infusion   Intravenous Continuous Fritzi Mandes, MD 75 mL/hr at 01/21/15 0411    . acetaminophen (TYLENOL) tablet 650 mg  650 mg Oral Q6H PRN Fritzi Mandes, MD       Or  . acetaminophen (TYLENOL) suppository 650 mg  650 mg Rectal Q6H PRN Fritzi Mandes, MD      . fentaNYL (SUBLIMAZE) injection 25 mcg  25 mcg Intravenous Q5 min PRN Gunnar Bulla, MD      . morphine 2 MG/ML injection 2 mg  2 mg Intravenous Q2H PRN Tama High III, MD      . ondansetron [facility]) tablet 4 mg  4 mg Oral Q6H PRN Fritzi Mandes, MD       Or  . ondansetron (ZOFRAN) injection 4 mg  4 mg Intravenous Q6H PRN Fritzi Mandes, MD      . ondansetron (ZOFRAN) injection 4 mg  4 mg Intravenous Once PRN Gunnar Bulla, MD      . pantoprazole (PROTONIX) injection 40 mg  40 mg Intravenous Q12H Adin Hector, MD  40 mg at 01/21/15 0848      .  PHYSICAL EXAMINATION:   Filed Vitals:   01/21/15 1611  BP: 136/77  Pulse: 58  Temp: 98.6 F (37 C)  Resp: 17   Filed Weights   01/20/15 1332  Weight: 121 lb 6 oz (55.055 kg)    GENERAL: Cachectic/tingling built; Alert, no distress and comfortable.  Accompanied by his wife. EYES: no pallor or icterus OROPHARYNX: no thrush or ulceration; good dentition  NECK: supple, no masses felt LYMPH:  no palpable lymphadenopathy in the cervical, axillary or inguinal regions LUNGS: clear to auscultation and  No wheeze or crackles HEART/CVS: regular rate & rhythm and no murmurs; No lower extremity edema ABDOMEN: abdomen soft, non-tender and normal bowel sounds. Recent surgical incision well healing. Musculoskeletal:no cyanosis of  digits and no clubbing  PSYCH: alert & oriented x 3 with fluent speech NEURO: no focal motor/sensory deficits SKIN:  no rashes or significant lesions; Foley catheter/back in place.  LABORATORY DATA:  I have reviewed the data as listed Lab Results  Component Value Date   WBC 8.5 01/20/2015   HGB 9.5* 01/20/2015   HCT 29.6* 01/20/2015   MCV 86.3 01/20/2015   PLT 241 01/20/2015    Recent Labs  09/11/14 1113 01/20/15 1600  NA 139 143  K 4.1 3.5  CL 116* 111  CO2 17* 24  GLUCOSE 107* 88  BUN 48* 24*  CREATININE 2.86* 2.16*  CALCIUM 8.7* 9.1  GFRNONAA 22* 31*  GFRAA 26* 36*  ALBUMIN 3.9  --     RADIOGRAPHIC STUDIES: I have personally reviewed the radiological images as listed and agreed with the findings in the report. Dg Chest 2 View  01/21/2015  CLINICAL DATA:  Dysphagia. Follow-up contrast from barium swallow performed yesterday. Recent surgery for bladder cancer. EXAM: CHEST  2 VIEW COMPARISON:  01/20/2015 barium swallow FINDINGS: There is residual barium in the thoracic esophagus proximal to an irregular esophageal stricture as described on yesterday's barium swallow. Cardiac silhouette is within normal limits. The lungs are hyperinflated without evidence of airspace consolidation, edema, pleural effusion, or pneumothorax. Free intraperitoneal air is again seen under the hemidiaphragms, similar to the prior study and likely related to recent surgery. Residual oral contrast is partially visualized in the splenic flexure of the colon. No acute osseous abnormality is seen. IMPRESSION: 1. Residual barium in the proximal to mid thoracic esophagus. 2. Clear lungs. 3. Unchanged intraperitoneal free air. Electronically Signed   By: Logan Bores M.D.   On: 01/21/2015 08:18   Dg Esophagus  01/20/2015  CLINICAL DATA:  Dysphasia. EXAM: ESOPHOGRAM/BARIUM SWALLOW TECHNIQUE: Single contrast examination was performed using  thick barium. FLUOROSCOPY TIME:  Radiation Exposure Index (as  provided by the fluoroscopic device): 18.9 mGy COMPARISON:  Chest x-ray 08/29/2012. FINDINGS: Cervical esophagus appears normal. A along segment high-grade irregular stricture noted the mid thoracic esophagus. This is worrisome for esophageal malignancy. This results in partial obstruction of the esophagus. Endoscopic evaluation suggested. Incidental note is made of free intraperitoneal air, this is most likely from the patient's recent surgery. Clinical correlation suggested. IMPRESSION: 1. 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 This is worrisome for esophageal malignancy. 2. Free intraperitoneal air, most likely from patient's recent surgery. Critical Value/emergent results were called by telephone at the time of interpretation on 01/20/2015 at 11:47 am to [facility] , who verbally acknowledged these results. Electronically Signed   By: Marcello Moores  Register   On: 01/20/2015 11:52  ASSESSMENT & PLAN:   64 year old male patient history of previous bladder cancer status post resection and chronic kidney disease with dysphagia and weight loss:  # Esophageal mass- causing the dysphagia/weight loss  # Anemia- multifactorial/CKD question iron deficiency. However ferritin elevated at 231.  # Chronic kidney disease/ prior history of bladder cancer status post resection  Recommendations:   # Await the biopsy results. Patient will likely need staging PET scan as an outpatient basis. The treatment would be dictated staging.   All questions were answered. The above plan of care was discussed with the patient and his wife in the room. They agree.  Thank you Dr.Skulskie for allowing me to participate the care of your pleasant patient.   I spent 40 minutes counseling the patient face to face. The total time spent in the appointment was 60 minutes and more than 50% was on counseling.     Cammie Sickle, MD 01/21/2015 6:02 PM

## 2015-01-21 NOTE — Anesthesia Postprocedure Evaluation (Signed)
  Anesthesia Post-op Note  Patient: Jeffrey [name]  Procedure(s) Performed: Procedure(s): ESOPHAGOGASTRODUODENOSCOPY (EGD) WITH PROPOFOL-looking in the esophagus stomach and upper small intestine to evaluate and treat (N/A)  Anesthesia type:General  Patient location: PACU  Post pain: Pain level controlled  Post assessment: Post-op Vital signs reviewed, Patient's Cardiovascular Status Stable, Respiratory Function Stable, Patent Airway and No signs of Nausea or vomiting  Post vital signs: Reviewed and stable  Last Vitals:  Filed Vitals:   01/21/15 1611  BP: 136/77  Pulse: 58  Temp: 37 C  Resp: 17    Level of consciousness: awake, alert  and patient cooperative  Complications: No apparent anesthesia complications

## 2015-01-21 NOTE — Transfer of Care (Signed)
Immediate Anesthesia Transfer of Care Note  Patient: Jeffrey [name]  Procedure(s) Performed: Procedure(s): ESOPHAGOGASTRODUODENOSCOPY (EGD) WITH PROPOFOL-looking in the esophagus stomach and upper small intestine to evaluate and treat (N/A)  Patient Location: PACU  Anesthesia Type:General  Level of Consciousness: awake, alert  and oriented  Airway & Oxygen Therapy: Patient Spontanous Breathing and Patient connected to face mask oxygen  Post-op Assessment: Report given to RN and Post -op Vital signs reviewed and stable  Post vital signs: Reviewed and stable  Last Vitals:  Filed Vitals:   01/21/15 1534  BP: 128/82  Pulse:   Temp: 36.4 C  Resp: 20    Complications: No apparent anesthesia complications

## 2015-01-22 ENCOUNTER — Other Ambulatory Visit: Payer: Self-pay

## 2015-01-22 ENCOUNTER — Inpatient Hospital Stay: Payer: Self-pay

## 2015-01-22 ENCOUNTER — Encounter: Payer: Self-pay | Admitting: Gastroenterology

## 2015-01-22 DIAGNOSIS — Z931 Gastrostomy status: Secondary | ICD-10-CM

## 2015-01-22 LAB — CBC
HEMATOCRIT: 28.4 % — AB (ref 40.0–52.0)
HEMOGLOBIN: 9 g/dL — AB (ref 13.0–18.0)
MCH: 27.8 pg (ref 26.0–34.0)
MCHC: 31.8 g/dL — AB (ref 32.0–36.0)
MCV: 87.6 fL (ref 80.0–100.0)
Platelets: 235 10*3/uL (ref 150–440)
RBC: 3.25 MIL/uL — ABNORMAL LOW (ref 4.40–5.90)
RDW: 15.1 % — ABNORMAL HIGH (ref 11.5–14.5)
WBC: 8.9 10*3/uL (ref 3.8–10.6)

## 2015-01-22 LAB — COMPREHENSIVE METABOLIC PANEL
ALBUMIN: 3.2 g/dL — AB (ref 3.5–5.0)
ALK PHOS: 50 U/L (ref 38–126)
ALT: 8 U/L — ABNORMAL LOW (ref 17–63)
ANION GAP: 8 (ref 5–15)
AST: 11 U/L — ABNORMAL LOW (ref 15–41)
BILIRUBIN TOTAL: 0.7 mg/dL (ref 0.3–1.2)
BUN: 22 mg/dL — ABNORMAL HIGH (ref 6–20)
CALCIUM: 8.5 mg/dL — AB (ref 8.9–10.3)
CO2: 20 mmol/L — ABNORMAL LOW (ref 22–32)
Chloride: 118 mmol/L — ABNORMAL HIGH (ref 101–111)
Creatinine, Ser: 1.96 mg/dL — ABNORMAL HIGH (ref 0.61–1.24)
GFR calc Af Amer: 40 mL/min — ABNORMAL LOW (ref >60)
GFR, EST NON AFRICAN AMERICAN: 35 mL/min — AB (ref >60)
GLUCOSE: 77 mg/dL (ref 65–99)
Potassium: 4.3 mmol/L (ref 3.5–5.1)
Sodium: 146 mmol/L — ABNORMAL HIGH (ref 135–145)
TOTAL PROTEIN: 6.4 g/dL — AB (ref 6.5–8.1)

## 2015-01-22 LAB — SURGICAL PATHOLOGY

## 2015-01-22 MED ORDER — SODIUM CHLORIDE 0.45 % IV SOLN
INTRAVENOUS | Status: DC
  Administered 2015-01-22: 22:00:00 via INTRAVENOUS

## 2015-01-22 MED ORDER — FENTANYL CITRATE (PF) 100 MCG/2ML IJ SOLN
INTRAMUSCULAR | Status: AC
  Filled 2015-01-22: qty 2

## 2015-01-22 MED ORDER — JEVITY 1.5 CAL/FIBER PO LIQD
237.0000 mL | Freq: Every day | ORAL | Status: DC
  Administered 2015-01-23 – 2015-01-24 (×5): 237 mL

## 2015-01-22 MED ORDER — GLUCAGON HCL RDNA (DIAGNOSTIC) 1 MG IJ SOLR
INTRAMUSCULAR | Status: AC
  Filled 2015-01-22: qty 1

## 2015-01-22 MED ORDER — CEFAZOLIN SODIUM-DEXTROSE 2-3 GM-% IV SOLR
2.0000 g | Freq: Once | INTRAVENOUS | Status: AC
  Administered 2015-01-22: 2 g via INTRAVENOUS
  Filled 2015-01-22: qty 50

## 2015-01-22 MED ORDER — MIDAZOLAM HCL 2 MG/2ML IJ SOLN
INTRAMUSCULAR | Status: AC
  Filled 2015-01-22: qty 2

## 2015-01-22 MED ORDER — HYDROMORPHONE HCL 1 MG/ML IJ SOLN
1.0000 mg | INTRAMUSCULAR | Status: DC | PRN
  Administered 2015-01-22 – 2015-01-23 (×6): 1 mg via INTRAVENOUS
  Filled 2015-01-22 (×6): qty 1

## 2015-01-22 MED ORDER — FENTANYL CITRATE (PF) 100 MCG/2ML IJ SOLN
INTRAMUSCULAR | Status: AC | PRN
  Administered 2015-01-22 (×4): 50 ug via INTRAVENOUS

## 2015-01-22 MED ORDER — IOHEXOL 300 MG/ML  SOLN
30.0000 mL | Freq: Once | INTRAMUSCULAR | Status: DC | PRN
  Administered 2015-01-22: 15 mL via ORAL
  Filled 2015-01-22: qty 30

## 2015-01-22 MED ORDER — LIDOCAINE HCL (PF) 1 % IJ SOLN
INTRAMUSCULAR | Status: AC
  Filled 2015-01-22: qty 10

## 2015-01-22 MED ORDER — DEXTROSE-NACL 5-0.45 % IV SOLN
INTRAVENOUS | Status: AC
  Administered 2015-01-22: 09:00:00 via INTRAVENOUS

## 2015-01-22 MED ORDER — ONDANSETRON HCL 4 MG/2ML IJ SOLN: 4.0000 mg | INTRAMUSCULAR | Status: DC | PRN

## 2015-01-22 MED ORDER — STERILE WATER FOR INJECTION IJ SOLN
INTRAMUSCULAR | Status: AC
  Filled 2015-01-22: qty 10

## 2015-01-22 MED ORDER — GLUCAGON HCL RDNA (DIAGNOSTIC) 1 MG IJ SOLR
INTRAMUSCULAR | Status: AC | PRN
  Administered 2015-01-22: 1 mg via INTRAVENOUS

## 2015-01-22 MED ORDER — HYDROCODONE-ACETAMINOPHEN 5-325 MG PO TABS: 1.0000 | ORAL_TABLET | ORAL | Status: DC | PRN

## 2015-01-22 MED ORDER — MIDAZOLAM HCL 2 MG/2ML IJ SOLN
INTRAMUSCULAR | Status: AC | PRN
  Administered 2015-01-22 (×4): 1 mg via INTRAVENOUS

## 2015-01-22 NOTE — Progress Notes (Signed)
Jeffrey [name]   DOB:Apr 26, 1950   YP#:[id number]    Subjective:  Patient had PEG tube this morning; placement was uneventful. He denies any significant pain. He complains of mild discomfort around the PEG tube area.  Review of system: No nausea no vomiting. No chest pain. Continues poor appetite.  Objective:  Filed Vitals:   01/22/15 1450  BP: 119/70  Pulse: 56  Temp: 97.5 F (36.4 C)  Resp:      Intake/Output Summary (Last 24 hours) at 01/22/15 1657 Last data filed at 01/22/15 2458  Gross per 24 hour  Intake 1122.5 ml  Output   1450 ml  Net -327.5 ml    GENERAL: Cachectic/tingling built; Alert, no distress and comfortable.Accompanied by wife/daughter.  6 EYES: no pallor or icterus OROPHARYNX: no thrush or ulceration; good dentition  NECK: supple, no masses felt LYMPH: no palpable lymphadenopathy in the cervical, axillary or inguinal regions LUNGS: clear to auscultation and No wheeze or crackles HEART/CVS: regular rate & rhythm and no murmurs; No lower extremity edema ABDOMEN: abdomen soft, non-tender and normal bowel sounds. Recent surgical incision well healing. PEG tube in place. Musculoskeletal:no cyanosis of digits and no clubbing  PSYCH: alert & oriented x 3 with fluent speech NEURO: no focal motor/sensory deficits SKIN: no rashes or significant lesions; Foley catheter/back in place.   Labs:  Lab Results  Component Value Date   WBC 8.9 01/22/2015   HGB 9.0* 01/22/2015   HCT 28.4* 01/22/2015   MCV 87.6 01/22/2015   PLT 235 01/22/2015   NEUTROABS 9.2* 08/30/2012    Lab Results  Component Value Date   NA 146* 01/22/2015   K 4.3 01/22/2015   CL 118* 01/22/2015   CO2 20* 01/22/2015    Studies:  Dg Chest 2 View  01/21/2015  CLINICAL DATA:  Dysphagia. Follow-up contrast from barium swallow performed yesterday. Recent surgery for bladder cancer. EXAM: CHEST  2 VIEW COMPARISON:  01/20/2015 barium swallow FINDINGS: There is residual barium in the thoracic  esophagus proximal to an irregular esophageal stricture as described on yesterday's barium swallow. Cardiac silhouette is within normal limits. The lungs are hyperinflated without evidence of airspace consolidation, edema, pleural effusion, or pneumothorax. Free intraperitoneal air is again seen under the hemidiaphragms, similar to the prior study and likely related to recent surgery. Residual oral contrast is partially visualized in the splenic flexure of the colon. No acute osseous abnormality is seen. IMPRESSION: 1. Residual barium in the proximal to mid thoracic esophagus. 2. Clear lungs. 3. Unchanged intraperitoneal free air. Electronically Signed   By: Logan Bores M.D.   On: 01/21/2015 08:18   Ir Gastrostomy Tube Mod Sed  01/22/2015  CLINICAL DATA:  64 year old male with near obstructing mid esophageal carcinoma. EXAM: PERC PLACEMENT GASTROSTOMY Date: 01/22/2015 PROCEDURE: 1. Fluoroscopically guided placement of percutaneous balloon retention gastrostomy tube. Interventional Radiologist:  Criselda Peaches, MD ANESTHESIA/SEDATION: Moderate (conscious) sedation was used. 4 mg Versed, 200 mcg Fentanyl were administered intravenously. The patient's vital signs were monitored continuously by radiology nursing throughout the procedure. Sedation Time: 48 minutes FLUOROSCOPY TIME:  5 minutes 54 seconds CONTRAST:  1 OMNIPAQUE IOHEXOL 300 MG/ML  SOLN MEDICATIONS: 2 g Ancef was administered intravenously within 1 hour of skin incision. TECHNIQUE: Informed consent was obtained from the patient following explanation of the procedure, risks, benefits and alternatives. The patient understands, agrees and consents for the procedure. All questions were addressed. A time out was performed. Maximal barrier sterile technique utilized including caps, mask, sterile gowns, sterile  gloves, large sterile drape, hand hygiene, and chlorhexadine skin prep. An angled catheter was advanced over a wire under fluoroscopic guidance  through the nose, down the esophagus and into the body of the stomach. The stomach was then insufflated with several 100 ml of air. Fluoroscopy confirmed location of the gastric bubble, as well as inferior displacement of the barium stained colon. Under direct fluoroscopic guidance, 2 T-tacks were placed, and the anterior gastric wall drawn up against the anterior abdominal wall. Percutaneous access was then obtained into the mid gastric body with an 18 gauge needle. Aspiration of air, and injection of contrast material under fluoroscopy confirmed needle placement. An Amplatz wire was advanced in the gastric body and the access needle exchanged for a 16 French peel-away sheath. A 14 French can group percutaneous gastrostomy tube was then lubricated and advanced through the peel-away sheath. The peel-away sheath was removed. The retention balloon was inflated with 5 mL dilute contrast and saline solution. The balloon was pulled snug against the anterior abdominal wall and the external bumper of fixed in place. The bumper was secured with an 0 Prolene suture. The T tacks were then cut. A gentle hand injection of contrast material through the gastrostomy tube confirmed intragastric location. The tube was then flushed with saline. The patient will be observed for several hours with the newly placed tube on low wall suction to evaluate for any post procedure complication. The patient tolerated the procedure well, there is no immediate complication. IMPRESSION: Successful placement of a 6 French push in balloon retention gastrostomy tube. Tubal be ready for use tomorrow morning 01/23/2015. Electronically Signed   By: Jacqulynn Cadet M.D.   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 Further plan of care to be decided based on stage of the disease. Patient likely need chemotherapy and radiation and possible surgery based upon staging workup.  The above plan was discussed with the patient/his wife and daughter at length. We will order a PET scan. The above plan was also discussed with Dr. Caryl Comes. I have tried reaching GI doctor Skulskie; unavailable.      Cammie Sickle, MD 01/22/2015  4:57 PM

## 2015-01-22 NOTE — Progress Notes (Signed)
Nutrition Follow-up  DOCUMENTATION CODES:   Severe malnutrition in context of acute illness/injury  INTERVENTION:  EN: Recommend jevity 1.5,  6 cans per day bolus feeding.  Will provide 2133 calories, 91 gm of protein and 1048ml free water. Recommend additional free water flush of 77ml before and after feeding to meet nutritional needs (total free water of ml/d). Per IR able to feed pt starting tomorrow at 8am, spoke with Dr. Caryl Comes and agreeable. Recommend starting at 1/2 can feeding and if tolerating increase to full can.     NUTRITION DIAGNOSIS:   Malnutrition related to altered GI function, acute illness as evidenced by percent weight loss, energy intake < or equal to 50% for > or equal to 5 days, moderate depletion of body fat, moderate depletions of muscle mass.    GOAL:   Patient will meet greater than or equal to 90% of their needs    MONITOR:    (Energy Intake, Electrolyte and Renal Profile, Anthropometrics, Digestive System)  REASON FOR ASSESSMENT:   Consult, Malnutrition Screening Tool Assessment of nutrition requirement/status  ASSESSMENT:     Pt s/p EGD yesterday showing esophageal mass, biopsy taken.  Oncology following.  IR able to place PEG tube this am   Current Nutrition: NPO, able to start feeding at 8am tomorrow per orders   Gastrointestinal Profile: Last BM: 10/18   Scheduled Medications:  . pantoprazole (PROTONIX) IV  40 mg Intravenous Q12H    Continuous Medications:  . dextrose 5 % and 0.45% NaCl 75 mL/hr at 01/22/15 0927     Electrolyte/Renal Profile and Glucose Profile:   Recent Labs Lab 01/20/15 1600 01/22/15 0650  NA 143 146*  K 3.5 4.3  CL 111 118*  CO2 24 20*  BUN 24* 22*  CREATININE 2.16* 1.96*  CALCIUM 9.1 8.5*  MG 1.9  --   PHOS 2.3*  --   GLUCOSE 88 77   Protein Profile:  Recent Labs Lab 01/22/15 0650  ALBUMIN 3.2*      Weight Trend since Admission: Filed Weights   01/20/15 1332  Weight: 121 lb 6 oz  (55.055 kg)      Diet Order:  Diet NPO time specified Except for: Ice Chips  Skin:   reviewed   Height:   Ht Readings from Last 1 Encounters:  01/20/15 5\' 7"  (1.702 m)    Weight:   Wt Readings from Last 1 Encounters:  01/20/15 121 lb 6 oz (55.055 kg)     BMI:  Body mass index is 19.01 kg/(m^2).  Estimated Nutritional Needs:   Kcal:  BEE: 1304kcals, TEE: (IF 1.1-1.3)(AF 1.3) 3833-3832NVBTY  Protein:  55-66g protein (1.0-1.2g/kg)  Fluid:  1375-1647mL of fluid (25-3mL/kg)  EDUCATION NEEDS:   Education needs no appropriate at this time  [place]. Zenia Resides, [place], [place] (pager)

## 2015-01-22 NOTE — Procedures (Signed)
Interventional Radiology Procedure Note  Procedure: Placement of percutaneous a 66F push-in gastrostomy tube. Complications: None Recommendations: - NPO except for sips and chips remainder of today and overnight - Maintain G-tube to LWS x 4 hrs  - May advance diet as tolerated and begin using tube tomorrow morning  Signed,  Criselda Peaches, MD

## 2015-01-22 NOTE — Progress Notes (Signed)
Report called to nurse erin, pt gcs 15 denies pain, 37fr 5cc j-tube sutured intact ports plugged

## 2015-01-22 NOTE — Plan of Care (Signed)
Problem: Discharge Progression Outcomes Goal: Activity appropriate for discharge plan Plan of care progress to goal: - No complaints of pain. - Continues IV fluids. - VSS. Will continue to monitor.

## 2015-01-22 NOTE — Plan of Care (Signed)
Problem: Discharge Progression Outcomes Goal: Activity appropriate for discharge plan Outcome: Progressing Plan of care progress to goal: Pt remains NPO.  Pt got PEG tube placed today. Scheduled to low suction for 4 hours. VSS. Family at the bedside. IVF infusing. C/o pain, diluadid given PRN. Pt had relief with rest. Plan to start tube feedings tomorrow morning.

## 2015-01-22 NOTE — Progress Notes (Signed)
Patient ID: Jeffrey [name], male   DOB: [birth year], 64 y.o.   MRN: [id number] SUBJECTIVE:  Pt admitted with progressive dysphagia with solids, with dehydration.  Barium swallow with esophageal stricture. EGD confirms esophageal mass, with biopsy results pending.  Oncology has seen.  Had recent abdominal surgery, with reimplantation of right ureter in neobladder, done at [facility].  Denies pain; no N/v.  Remains NPO.  No BM over last 2 days.  ______________________________________________________________________  ROS: Please see HPI; remainder of complete 10 point ROS is negative   Past Medical History  Diagnosis Date  . Hypertension     Past Surgical History  Procedure Laterality Date  . Bladder removal       Current facility-administered medications:  .  acetaminophen (TYLENOL) tablet 650 mg, 650 mg, Oral, Q6H PRN **OR** acetaminophen (TYLENOL) suppository 650 mg, 650 mg, Rectal, Q6H PRN, Fritzi Mandes, MD .  dextrose 5 %-0.45 % sodium chloride infusion, , Intravenous, Continuous, Tama High III, MD .  fentaNYL (SUBLIMAZE) injection 25 mcg, 25 mcg, Intravenous, Q5 min PRN, Gunnar Bulla, MD .  morphine 2 MG/ML injection 2 mg, 2 mg, Intravenous, Q2H PRN, Tama High III, MD .  ondansetron (ZOFRAN) tablet 4 mg, 4 mg, Oral, Q6H PRN **OR** ondansetron (ZOFRAN) injection 4 mg, 4 mg, Intravenous, Q6H PRN, Fritzi Mandes, MD .  ondansetron (ZOFRAN) injection 4 mg, 4 mg, Intravenous, Once PRN, Gunnar Bulla, MD .  pantoprazole (PROTONIX) injection 40 mg, 40 mg, Intravenous, Q12H, Tama High III, MD, 40 mg at 01/22/15 0737  PHYSICAL EXAM:  BP 120/63 mmHg  Pulse 55  Temp(Src) 98.7 F (37.1 C) (Oral)  Resp 18  Ht _0  (1.702 m)  Wt 55.055 kg (121 lb 6 oz)  BMI 19.01 kg/m2  SpO2 100%  General: thin male, in NAD HEENT: PERRL; OP moist without lesions. Neck: supple, trachea midline, no thyromegaly Chest: slight hyperexpansion Lungs: clear bilaterally without retractions or  wheezes Cardiovascular: RRR, no murmur, no gallop; distal pulses 2+ Abdomen: soft, nontender, nondistended, positive bowel sounds.  Post op site intact without erythema Extremities: no clubbing, cyanosis, edema Neuro: alert and oriented, moves all extremities Derm: no significant rashes or nodules Lymph: no cervical or supraclavicular lymphadenopathy  Labs and imaging studies were reviewed  ASSESSMENT/PLAN:   1. Esophageal mass/dysphagia with solids/dehydration- appreciate GI and oncology input.  Cont IVF support.  Will need feeding tube, given obstruction, which will likely worsen as he starts treatment.  Discussed with pt.  Will contact IR, as this can't be done with endoscopic assistance as scope will not pass the mass 2. CKD stage 3- Cr at baseline; adjust to lower sodium fluid due to hypernatremia.  Follow met b 3. Bladder cancer with prior bladder resection- pt s/p reimplantation of right ureter into neobladder; follow exam of incision site.   Pt's urine will be chronically contaminated.  4. HTN. Off meds due to relative hypotension 5. Severe protein/calorie malnutrition- appreciate dietary; will plan to correct enterally once g-tube in place.

## 2015-01-22 NOTE — Consult Note (Signed)
GI Inpatient Follow-up Note  Patient Identification: Jeffrey [name] is a 64 y.o. male with dysphagia due to esophogeal mass.  Subjective:  He reports that he has not had any other GI complaints.  He has attempted a few ice chips without issue.  He is waiting for IR to place the feeding tube to begin receiving nutrition.  No BM yet, no abdominal pain.   Scheduled Inpatient Medications:  .  ceFAZolin (ANCEF) IV  2 g Intravenous Once  . [MAR Hold] pantoprazole (PROTONIX) IV  40 mg Intravenous Q12H    Continuous Inpatient Infusions:   . dextrose 5 % and 0.45% NaCl 75 mL/hr at 01/22/15 0927    PRN Inpatient Medications:  [MAR Hold] acetaminophen **OR** [MAR Hold] acetaminophen, fentaNYL (SUBLIMAZE) injection, [MAR Hold]  morphine injection, [MAR Hold] ondansetron **OR** [MAR Hold] ondansetron (ZOFRAN) IV, ondansetron (ZOFRAN) IV  Review of Systems: Constitutional: Weight loss.  Eyes: No changes in vision. ENT: No oral lesions, sore throat.  GI: see HPI.  Heme/Lymph: No easy bruising.  CV: No chest pain.  GU: No hematuria.  Integumentary: No rashes.  Neuro: No headaches.  Psych: No depression/anxiety.  Endocrine: No heat/cold intolerance.  Allergic/Immunologic: No urticaria.  Resp: No cough, SOB.  Musculoskeletal: No joint swelling.    Physical Examination: BP 150/79 mmHg  Pulse 55  Temp(Src) 98.7 F (37.1 C) (Oral)  Resp 18  Ht 5\' 7"  (1.702 m)  Wt 55.055 kg (121 lb 6 oz)  BMI 19.01 kg/m2  SpO2 98% Gen: NAD, alert and oriented x 4, very thin, chronically ill appearing. HEENT: PEERLA, EOMI, Neck: supple, no JVD or thyromegaly Chest: CTA bilaterally, no wheezes, crackles, or other adventitious sounds CV: RRR, no m/g/c/r Abd: soft, NT, ND, +BS in all four quadrants; no HSM, guarding, ridigity, or rebound tenderness Ext: no edema, well perfused with 2+ pulses, Skin: no rash or lesions noted Lymph: no LAD  Data: Lab Results  Component Value Date   WBC 8.9 01/22/2015    HGB 9.0* 01/22/2015   HCT 28.4* 01/22/2015   MCV 87.6 01/22/2015   PLT 235 01/22/2015    Recent Labs Lab 01/20/15 1600 01/22/15 0650  HGB 9.5* 9.0*   Lab Results  Component Value Date   NA 146* 01/22/2015   K 4.3 01/22/2015   CL 118* 01/22/2015   CO2 20* 01/22/2015   BUN 22* 01/22/2015   CREATININE 1.96* 01/22/2015   Lab Results  Component Value Date   ALT 8* 01/22/2015   AST 11* 01/22/2015   ALKPHOS 50 01/22/2015   BILITOT 0.7 01/22/2015    Recent Labs Lab 01/20/15 1600  INR 1.02   Assessment/Plan: Mr. Jeffrey [name] is a 64 y.o. male with dysphagia due to esophageal mass.  EGD /biopsy completed yesterday, awaiting path report.  Oncology has been consulted.  IR ordered for feeding tube placement.  Recommendations: No new recommendations at this time.  We will continue to follow with you. Please call with questions or concerns.  Salvadore Farber, PA-C  I personally performed these services.

## 2015-01-23 LAB — BASIC METABOLIC PANEL
Anion gap: 6 (ref 5–15)
BUN: 20 mg/dL (ref 6–20)
CALCIUM: 8.6 mg/dL — AB (ref 8.9–10.3)
CHLORIDE: 117 mmol/L — AB (ref 101–111)
CO2: 23 mmol/L (ref 22–32)
CREATININE: 1.88 mg/dL — AB (ref 0.61–1.24)
GFR calc Af Amer: 43 mL/min — ABNORMAL LOW (ref >60)
GFR calc non Af Amer: 37 mL/min — ABNORMAL LOW (ref >60)
GLUCOSE: 90 mg/dL (ref 65–99)
Potassium: 4.2 mmol/L (ref 3.5–5.1)
Sodium: 146 mmol/L — ABNORMAL HIGH (ref 135–145)

## 2015-01-23 LAB — GLUCOSE, CAPILLARY: Glucose-Capillary: 68 mg/dL (ref 65–99)

## 2015-01-23 MED ORDER — PANTOPRAZOLE SODIUM 40 MG PO PACK
40.0000 mg | PACK | Freq: Two times a day (BID) | ORAL | Status: DC
  Administered 2015-01-23 – 2015-01-24 (×2): 40 mg
  Filled 2015-01-23 (×2): qty 20

## 2015-01-23 MED ORDER — DEXTROSE 5 % IV SOLN
INTRAVENOUS | Status: DC
  Administered 2015-01-23 – 2015-01-24 (×2): via INTRAVENOUS

## 2015-01-23 MED ORDER — OMEPRAZOLE 2 MG/ML ORAL SUSPENSION
20.0000 mg | Freq: Two times a day (BID) | ORAL | Status: DC
  Filled 2015-01-23 (×3): qty 10

## 2015-01-23 MED ORDER — OXYCODONE HCL 5 MG/5ML PO SOLN: 5.0000 mg | Freq: Four times a day (QID) | ORAL | Status: DC | PRN

## 2015-01-23 MED ORDER — FLUDEOXYGLUCOSE F - 18 (FDG) INJECTION
11.8700 | Freq: Once | INTRAVENOUS | Status: DC | PRN
  Administered 2015-01-23: 11.87 via INTRAVENOUS
  Filled 2015-01-23: qty 11.87

## 2015-01-23 MED ORDER — DEXTROSE 5 % IV SOLN: INTRAVENOUS | Status: DC

## 2015-01-23 MED ORDER — BACITRACIN-NEOMYCIN-POLYMYXIN OINTMENT TUBE
TOPICAL_OINTMENT | Freq: Every day | CUTANEOUS | Status: DC
  Administered 2015-01-23 – 2015-01-24 (×2): via TOPICAL
  Filled 2015-01-23: qty 15

## 2015-01-23 NOTE — Plan of Care (Signed)
Problem: Discharge Progression Outcomes Goal: Activity appropriate for discharge plan Outcome: Completed/Met Date Met:  01/23/15 Plan of care progress to goal 1. Pain control: Pt pain adequately managed with IV Dilaudid 2. Hemodynamics: Vital signs stable. IV fluids at 83ms per hour. Called on call MD to change D5 1/2NS to 1/2 NS due to patient going for PET scan today.  3. Complications: pt had EGD on 10/19. Revealed a esophageal mass. Oncology consult complete. Scheduled for PET scan today. 4. Tolerating diet: Non applicable. Pt currently NPO. To start TF today. 5 mls of residuals (green) and Peg flushed with 60 mls of water. No resistance met. 5. Activity: Pt up ad lib. Low fall risk.

## 2015-01-23 NOTE — Care Management (Signed)
Patient to have PET scan done today.  I left message for Jeffrey [name] [phone] in financial counseling.  Also provided patient with information to apply for disability.  Patient states that he doesn't use the internet, so he will call the number provided today.

## 2015-01-23 NOTE — Progress Notes (Signed)
Patient ID: Jeffrey [name], male   DOB: [birth year], 64 y.o.   MRN: [id number] SUBJECTIVE:  Pt admitted with progressive dysphagia with solids, with dehydration.  Barium swallow with esophageal stricture. EGD confirms esophageal mass; biopsy with squamous cell carcinoma.  Oncology, GI following; to go for PET today for staging.  Underwent G tube placement, w/o issue.  Minimal pain at insertion site; no chest pain, dyspnea, nausea overnight.  Bowels moved yesterday  ______________________________________________________________________  ROS: Please see HPI; remainder of complete 10 point ROS is negative   Past Medical History  Diagnosis Date  . Hypertension     Past Surgical History  Procedure Laterality Date  . Bladder removal    . Esophagogastroduodenoscopy (egd) with propofol N/A 01/21/2015    Procedure: ESOPHAGOGASTRODUODENOSCOPY (EGD) WITH PROPOFOL-looking in the esophagus stomach and upper small intestine to evaluate and treat;  Surgeon: [name], MD;  Location: [facility] ENDOSCOPY;  Service: Endoscopy;  Laterality: N/A;     Current facility-administered medications:  .  acetaminophen (TYLENOL) tablet 650 mg, 650 mg, Oral, Q6H PRN **OR** acetaminophen (TYLENOL) suppository 650 mg, 650 mg, Rectal, Q6H PRN, Fritzi Mandes, MD .  dextrose 5 % solution, , Intravenous, Continuous, Tama High III, MD .  feeding supplement (JEVITY 1.5 CAL/FIBER) liquid 237 mL, 237 mL, Per Tube, 6 X Daily, Adin Hector, MD, Stopped at 01/23/15 0900 .  HYDROcodone-acetaminophen (NORCO/VICODIN) 5-325 MG per tablet 1-2 tablet, 1-2 tablet, Oral, Q4H PRN, Jacqulynn Cadet, MD .  HYDROmorphone (DILAUDID) injection 1 mg, 1 mg, Intravenous, Q2H PRN, Jacqulynn Cadet, MD, 1 mg at 01/23/15 0731 .  iohexol (OMNIPAQUE) 300 MG/ML solution 30 mL, 30 mL, Oral, Once PRN, Jacqulynn Cadet, MD, 15 mL at 01/22/15 1158 .  morphine 2 MG/ML injection 2 mg, 2 mg, Intravenous, Q2H PRN, Tama High III, MD .  ondansetron  [facility]) tablet 4 mg, 4 mg, Oral, Q6H PRN **OR** ondansetron (ZOFRAN) injection 4 mg, 4 mg, Intravenous, Q6H PRN, Fritzi Mandes, MD .  ondansetron (ZOFRAN) injection 4 mg, 4 mg, Intravenous, Q4H PRN, Jacqulynn Cadet, MD .  pantoprazole (PROTONIX) injection 40 mg, 40 mg, Intravenous, Q12H, Tama High III, MD, 40 mg at 01/22/15 2132  PHYSICAL EXAM:  BP 122/68 mmHg  Pulse 49  Temp(Src) 97.9 F (36.6 C) (Oral)  Resp 18  Ht '5\' 7"'  (1.702 m)  Wt 55.055 kg (121 lb 6 oz)  BMI 19.01 kg/m2  SpO2 100%  General: thin male, in NAD HEENT: PERRL; OP moist without lesions. Neck: supple, trachea midline, no thyromegaly Chest: slight hyperexpansion Lungs: clear bilaterally without retractions or wheezes Cardiovascular: RRR, no murmur, no gallop; distal pulses 2+ Abdomen: soft, nondistended, quiet bowel sounds.  G tube site without drainage.  Tube to neobladder. Extremities: no clubbing, cyanosis, edema Neuro: alert and oriented, moves all extremities Derm: no significant rashes or nodules Lymph: no cervical or supraclavicular lymphadenopathy  Labs and imaging studies were reviewed  ASSESSMENT/PLAN:   1. Esophageal cancer- discussed results with pt this AM; he has good understanding of process from his discussion with oncology yesterday.  PET scan today to further delineate treatment plan 2. CKD stage 3- Cr at baseline; change IVF to d5 due to hypernatremia, once done PET scan.  Follow met b 3. Bladder cancer with prior bladder resection- pt s/p reimplantation of right ureter into neobladder last week at [facility]; follow abd exam.   Pt's urine will be chronically contaminated.  4. HTN. Off meds due to relative hypotension 5. Severe  protein/calorie malnutrition- appreciate dietary; they will initiate tube feeding and pt training 6. D/c plan- once pt comfortable with G tube/feeding, should be able to go home to f/u at cancer center.  Will need to return to our office in next 1-2 wks to follow BP.

## 2015-01-23 NOTE — Plan of Care (Signed)
Problem: Discharge Progression Outcomes Goal: Complications resolved/controlled Outcome: Progressing Plan of care progress to goal: Pt had PET scan this morning. Tube feedings started when patient returned. Tolerated well so far. Educated patient on tube feeds education, schedule and process.  Dieitician to come and talk with him further. VSS. C/o pain before scan this am, dilaudid given PRN for pain. IV dextrose infusing. On tele, SB. MD aware. Plan for discharge home when ready.

## 2015-01-23 NOTE — Progress Notes (Addendum)
Pt to have PET scan around lunch time. Holding dextrose IVF and tube feedings until patient completes scan. MD aware.

## 2015-01-23 NOTE — Progress Notes (Signed)
Nutrition Follow-up  DOCUMENTATION CODES:   Severe malnutrition in context of acute illness/injury  INTERVENTION:   EN: Pt TF held this am for PET scan. RN Junie Panning reports pt just had first TF bolus, 164mL this afternoon of Jevity 1.5 and tolerated very well. Will advance to full bolus feedings tomorrow. RD notes pt remains on IVF at this time. Coordination of Care: will recommend daily weights Education: RD provided "Tube Feeding at Home" Booklet by [employer]. RD educated pt on tube feeding administration, and provided pt with 'Recommended Schedules' for 6 cans a day using 3 feedings, 4 feedings or 5 feedings per day and additional free water for hydration. RD educated pt on ways to overcome contraindications with tube feeding such as diarrhea or dehydration.  RD answered all questions. Teach back used. Expect good compliance. Family member also present during education.  NUTRITION DIAGNOSIS:   Malnutrition related to altered GI function, acute illness as evidenced by percent weight loss, energy intake < or equal to 50% for > or equal to 5 days, moderate depletion of body fat, moderate depletions of muscle mass  GOAL:   Patient will meet greater than or equal to 90% of their needs; ongoing  MONITOR:    (Energy Intake, Electrolyte and Renal Profile, Anthropometrics, Digestive System)  REASON FOR ASSESSMENT:   Consult, Malnutrition Screening Tool Assessment of nutrition requirement/status  ASSESSMENT:   Pt admitted with dysphagia with possible esophageal mass concerning for malignancy per MD note; pt scheduled for EGD 10/19. Pt with recent admission 10/9-10/17 at [facility] with reimplantation of right ureter with h/o bladder cancer with prior bladder resection.   PEG placed with IR yesterday. Pt scheduled for PET scan this am for staging of esophageal mass.   Diet Order:    NPO except ice chips   Current Nutrition: Jevity 1.5 bolus feeds 130mL half cans at this time with free water  flushes of 6mL before and after each feeding.  One feeding has been administered at this time. Pt reports tolerating well with no nausea or discomfort.   Gastrointestinal Profile: Last BM: 01/22/2015   Scheduled Medications:  . feeding supplement (JEVITY 1.5 CAL/FIBER)  237 mL Per Tube 6 X Daily  . pantoprazole sodium  40 mg Per Tube BID    Continuous Medications:  . dextrose 50 mL/hr at 01/23/15 1345     Electrolyte/Renal Profile and Glucose Profile:   Recent Labs Lab 01/20/15 1600 01/22/15 0650 01/23/15 0443  NA 143 146* 146*  K 3.5 4.3 4.2  CL 111 118* 117*  CO2 24 20* 23  BUN 24* 22* 20  CREATININE 2.16* 1.96* 1.88*  CALCIUM 9.1 8.5* 8.6*  MG 1.9  --   --   PHOS 2.3*  --   --   GLUCOSE 88 77 90   Protein Profile:   Recent Labs Lab 01/22/15 0650  ALBUMIN 3.2*     Weight Trend since Admission: Filed Weights   01/20/15 1332  Weight: 121 lb 6 oz (55.055 kg)     BMI:  Body mass index is 19.01 kg/(m^2).  Estimated Nutritional Needs:   Kcal:  BEE: 1304kcals, TEE: (IF 1.1-1.3)(AF 1.3) [id number]  Protein:  55-66g protein (1.0-1.2g/kg)  Fluid:  1375-1643mL of fluid (25-57mL/kg)    EDUCATION NEEDS:   Education needs addressed   [place], RD, LDN Pager [phone]

## 2015-01-24 LAB — COMPREHENSIVE METABOLIC PANEL
ALBUMIN: 3 g/dL — AB (ref 3.5–5.0)
ALK PHOS: 45 U/L (ref 38–126)
ALT: 6 U/L — AB (ref 17–63)
ANION GAP: 5 (ref 5–15)
AST: 12 U/L — ABNORMAL LOW (ref 15–41)
BUN: 18 mg/dL (ref 6–20)
CALCIUM: 8.7 mg/dL — AB (ref 8.9–10.3)
CHLORIDE: 113 mmol/L — AB (ref 101–111)
CO2: 24 mmol/L (ref 22–32)
CREATININE: 1.88 mg/dL — AB (ref 0.61–1.24)
GFR calc Af Amer: 43 mL/min — ABNORMAL LOW (ref >60)
GFR calc non Af Amer: 37 mL/min — ABNORMAL LOW (ref >60)
GLUCOSE: 112 mg/dL — AB (ref 65–99)
Potassium: 4.2 mmol/L (ref 3.5–5.1)
SODIUM: 142 mmol/L (ref 135–145)
Total Bilirubin: 0.5 mg/dL (ref 0.3–1.2)
Total Protein: 6.3 g/dL — ABNORMAL LOW (ref 6.5–8.1)

## 2015-01-24 LAB — CBC
HCT: 28.4 % — ABNORMAL LOW (ref 40.0–52.0)
HEMOGLOBIN: 9.6 g/dL — AB (ref 13.0–18.0)
MCH: 29.4 pg (ref 26.0–34.0)
MCHC: 33.7 g/dL (ref 32.0–36.0)
MCV: 87.3 fL (ref 80.0–100.0)
Platelets: 221 10*3/uL (ref 150–440)
RBC: 3.25 MIL/uL — AB (ref 4.40–5.90)
RDW: 15.3 % — ABNORMAL HIGH (ref 11.5–14.5)
WBC: 8.9 10*3/uL (ref 3.8–10.6)

## 2015-01-24 MED ORDER — CETYLPYRIDINIUM CHLORIDE 0.05 % MT LIQD
7.0000 mL | Freq: Two times a day (BID) | OROMUCOSAL | Status: DC
  Administered 2015-01-24 (×2): 7 mL via OROMUCOSAL

## 2015-01-24 MED ORDER — OXYCODONE HCL 5 MG/5ML PO SOLN: 5.0000 mg | Freq: Four times a day (QID) | ORAL | Status: DC | PRN

## 2015-01-24 MED ORDER — BACITRACIN-NEOMYCIN-POLYMYXIN OINTMENT TUBE: 1.0000 "application " | TOPICAL_OINTMENT | Freq: Every day | CUTANEOUS | Status: DC

## 2015-01-24 MED ORDER — PANTOPRAZOLE SODIUM 40 MG PO PACK: 40.0000 mg | PACK | Freq: Two times a day (BID) | ORAL | Status: DC

## 2015-01-24 MED ORDER — JEVITY 1.5 CAL/FIBER PO LIQD: 237.0000 mL | Freq: Every day | ORAL | Status: DC

## 2015-01-24 MED ORDER — JEVITY 1.5 CAL/FIBER PO LIQD
237.0000 mL | Freq: Every day | ORAL | Status: DC
  Administered 2015-01-24: 14:00:00 237 mL

## 2015-01-24 NOTE — Progress Notes (Signed)
Jeffrey [name]   DOB:[birth year]   HG#:[id number]    Subjective:  Patient resting in the bed. He is using the PEG tube this morning without any difficulty. He still is not able to tolerate by mouth.  Review of system: No nausea no vomiting. No chest pain. Denies abdominal pain.  Objective:  Filed Vitals:   01/24/15 0436  BP: 124/74  Pulse: 69  Temp: 98.6 F (37 C)  Resp: 18     Intake/Output Summary (Last 24 hours) at 01/24/15 1228 Last data filed at 01/24/15 0845  Gross per 24 hour  Intake   2564 ml  Output    590 ml  Net   1974 ml    GENERAL: Cachectic/tingling built; Alert, no distress and comfortable.Accompanied by wife. 6 EYES: no pallor or icterus OROPHARYNX: no thrush or ulceration; good dentition  NECK: supple, no masses felt LYMPH: No axillary or inguinal regions. About 2 cm lymph node felt in the high left supraclavicular region. Nontender. LUNGS: clear to auscultation and No wheeze or crackles HEART/CVS: regular rate & rhythm and no murmurs; No lower extremity edema ABDOMEN: abdomen soft, non-tender and normal bowel sounds. Recent surgical incision well healing. PEG tube in place. Musculoskeletal:no cyanosis of digits and no clubbing  PSYCH: alert & oriented x 3 with fluent speech NEURO: no focal motor/sensory deficits SKIN: no rashes or significant lesions; Foley catheter/back in place.   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 EXAM: NUCLEAR MEDICINE PET SKULL BASE TO THIGH TECHNIQUE: 11.87 mCi F-18 FDG was injected  intravenously. Full-ring PET imaging was performed from the skull base to thigh after the radiotracer. CT data was obtained and used for attenuation correction and anatomic localization. FASTING BLOOD GLUCOSE:  Value: 68 mg/dl COMPARISON:  Abdominal pelvic CT 08/24/2012 and 08/27/2012. Esophagram 01/20/2015. FINDINGS: NECK There is hypermetabolic left supraclavicular lymphadenopathy. The individual nodes are difficult to measure on the noncontrast CT images, as they are difficult to separate from the adjacent vessels and musculature. Estimated size is up to 2 cm. SUV max is 14.2. There is no contralateral or upper cervical lymphadenopathy.There are no lesions of the pharyngeal mucosal space. CHEST There is marked hypermetabolic activity within the known in mid esophageal mass. This demonstrates an SUV max of 13.1. There is retained barium within the esophageal lumen which is dilated proximal to this mass. Apart from the mass, no hypermetabolic mediastinal, hilar or axillary lymph nodes identified. There is no suspicious pulmonary activity. Emphysema, biapical scarring, mild bibasilar atelectasis and mild atherosclerosis are noted. ABDOMEN/PELVIS There is no hypermetabolic activity within the liver, adrenal glands, spleen or pancreas. There is no hypermetabolic nodal activity. Retained barium within the colon limits the quality of the examination, especially within the lower abdomen and pelvis. There is a stable small low-density right adrenal lesion consistent with an adenoma. Percutaneous G-tube, diffuse atherosclerosis, residual free intraperitoneal air, presumed pelvic surgical drain and Foley catheter are noted. Some gas within the left intrarenal collecting system  is noted. SKELETON There is no hypermetabolic activity to suggest osseous metastatic disease. IMPRESSION: 1. The patient's known mid esophageal cancer is significantly hypermetabolic. There are metastases to left supraclavicular lymph nodes. 2. No  other signs of metastatic disease. 3. Follow-up chest CT with contrast may be helpful to better evaluate the local extent of the mid esophageal cancer. 4. Recent postsurgical changes in the pelvis with associated residual pneumoperitoneum. Electronically Signed   By: Richardean Sale M.D.   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 PET scan- mid esophageal mass; with likely metastases to the left supraclavicular lymph node.  # PEG tube placement through interventional radiology for malnutrition  # Anemia- multifactorial-CKD  # Prior history of bladder cancer [non-muscle invasive as per family]    Plan:   # Reviewed the images of the PET scan the patient and his wife in detail. I also reviewed my concerns regarding the left lymph node involvement; however there is no evidence of any significant metastatic/burden in the rest of the body.   # Patient will need evaluation with radiation oncology; and also surgery. Given his comorbidities-CKD/ and also the left supraclavicular lymph node involvement; I'm not sure if he is a surgical candidate. However, would advice at least a surgical evaluation.  # I also discussed in general the treatment option would include concurrent chemoradiation therapy anywhere between 5-6 weeks unless radiation oncology feels otherwise. I will speak to Dr. Donella Stade radiation oncology on Monday. Question need for need for lymph node biopsy.  The above plan was discussed with the patient/his wife.patient could be discharged from medical oncology standpoint; we will plan follow-up outpatient.   The above plan was also discussed with Dr.Hande.   Cammie Sickle, MD 01/24/2015  12:28 PM

## 2015-01-24 NOTE — Progress Notes (Signed)
Pt and family given prescriptions x 3, pt instructed r/t tube feeds and medications and followup care, voiced understanding, pt demonstrated last tube feed by himself, tolerated well and performed accurately, pt d/c home via wheelchair per orders, escorted by family and staff

## 2015-01-24 NOTE — Plan of Care (Signed)
Problem: Discharge Progression Outcomes Goal: Complications resolved/controlled Outcome: Progressing Plan of care progress to goal: 1. Pain control: Pt pain controlled by 1 mg of IV Dilaudid. 2. Hemodynamics: Vital signs stable.  3. Complications: Pt had PET scan yesterday which revealed mets to the left lymph. Pt unaware of recent results 4. Diet: Initiation of bolus TF yesterday. Pt has tolerated the TF with minimal residuals. Pt actively participating in the process.

## 2015-01-24 NOTE — Discharge Summary (Signed)
Physician Discharge Summary  Jeffrey [name] CBJ:[id number] DOB: 1951-03-10 DOA: 01/20/2015  PCP: Adin Hector, MD  Admit date: 01/20/2015 Discharge date: 01/24/2015  Time spent: 35 minutes  Recommendations for Outpatient Follow-up:  1. D/c Home today. 2. Home Health Nursing  3. Call office to make appt inmn 1-2 weeks  4. Follow up with oncology  Discharge Diagnoses:   1  Squamous cell Ca of Esophagus 2 Dysphagia 3 Dehydration 4 CKD (chronic kidney disease) stage 3, GFR 30-59 ml/min 5  History of  Bladder cancer (HCC) 6 Protein-calorie malnutrition, severe   Discharge Condition: Stable  Diet recommendation: G Tube feeds  Filed Weights   01/20/15 1332  Weight: 55.055 kg (121 lb 6 oz)    History of present illness:  Jeffrey [name] is a 64 year old male with a known history of transitional cell bladder cancer who was admitted from the GI clinic due to dysphagia and weight loss` Barium swallow was suggestive malignant stricture of the esophagus with a mid esophageal lesion at least 4-5 cm there was only a trickle of barium getting through that region  Hospital Course:  Patient underwent an EGD that but confirmed esophageal mass and biopsy was consistent with squamous cell carcinoma. Oncology was also consulted Patient underwent a PET scan that did not show evidence of metastases. He subsequently underwent G-tube placement Patient was also seen by dietitian for severe malnutrition. Patient was tolerating half can boluses and this was subsequently increased to reach the tube feed goal of 6 Boluses of 237 MLS Jevity 1.5 cans at each bolus feeding. Patient was also advised free water flushes of 50 and before and after each bolus and additional free water 150-20 mL 2-3 times per day to meet hydration needs. Home health nursing was also arrange for him Patient will follow up with his primary care physician Dr. Caryl Comes also follow with oncology. He was stable at time of  discharge  Procedures: EGD G tube  Consultations: Dr Shawn Route Dr. Rogue Bussing- Oncology  Discharge Exam: Filed Vitals:   01/24/15 0436  BP: 124/74  Pulse: 69  Temp: 98.6 F (37 C)  Resp: 18    General: Not inn distress Cardiovascular: S1 S2 Respiratory: Clear to auscultation  Discharge Instructions   Discharge Instructions    Diet - low sodium heart healthy    Complete by:  As directed      Discharge instructions    Complete by:  As directed   D/c Home today. Home Health Nursing  Follow up with Medical Oncology . Follow up with Dr. Caryl Comes Call office to make appt in 1-2 weeks          Current Discharge Medication List    START taking these medications   Details  neomycin-bacitracin-polymyxin (NEOSPORIN) OINT Apply 1 application topically daily. Qty: 30 g, Refills: 1    Nutritional Supplements (FEEDING SUPPLEMENT, JEVITY 1.5 CAL/FIBER,) LIQD Place 237 mLs into feeding tube 6 (six) times daily. Qty: 1000 mL, Refills: 5    oxyCODONE (ROXICODONE) 5 MG/5ML solution Place 5 mLs (5 mg total) into feeding tube every 6 (six) hours as needed for severe pain. Qty: 15 mL, Refills: 0    pantoprazole sodium (PROTONIX) 40 mg/20 mL PACK Place 20 mLs (40 mg total) into feeding tube 2 (two) times daily. Qty: 30 each, Refills: 3      CONTINUE these medications which have NOT CHANGED   Details  amLODipine (NORVASC) 5 MG tablet Take 5 mg by mouth daily.  STOP taking these medications     pantoprazole (PROTONIX) 40 MG tablet        Allergies  Allergen Reactions  . Aspirin Nausea Only      The results of significant diagnostics from this hospitalization (including imaging, microbiology, ancillary and laboratory) are listed below for reference.    Significant Diagnostic Studies: Dg Chest 2 View  01/21/2015  CLINICAL DATA:  Dysphagia. Follow-up contrast from barium swallow performed yesterday. Recent surgery for bladder cancer. EXAM: CHEST  2 VIEW  COMPARISON:  01/20/2015 barium swallow FINDINGS: There is residual barium in the thoracic esophagus proximal to an irregular esophageal stricture as described on yesterday's barium swallow. Cardiac silhouette is within normal limits. The lungs are hyperinflated without evidence of airspace consolidation, edema, pleural effusion, or pneumothorax. Free intraperitoneal air is again seen under the hemidiaphragms, similar to the prior study and likely related to recent surgery. Residual oral contrast is partially visualized in the splenic flexure of the colon. No acute osseous abnormality is seen. IMPRESSION: 1. Residual barium in the proximal to mid thoracic esophagus. 2. Clear lungs. 3. Unchanged intraperitoneal free air. Electronically Signed   By: Logan Bores M.D.   On: 01/21/2015 08:18   Ir Gastrostomy Tube Mod Sed  01/22/2015  CLINICAL DATA:  64 year old male with near obstructing mid esophageal carcinoma. EXAM: PERC PLACEMENT GASTROSTOMY Date: 01/22/2015 PROCEDURE: 1. Fluoroscopically guided placement of percutaneous balloon retention gastrostomy tube. Interventional Radiologist:  Criselda Peaches, MD ANESTHESIA/SEDATION: Moderate (conscious) sedation was used. 4 mg Versed, 200 mcg Fentanyl were administered intravenously. The patient's vital signs were monitored continuously by radiology nursing throughout the procedure. Sedation Time: 48 minutes FLUOROSCOPY TIME:  5 minutes 54 seconds CONTRAST:  1 OMNIPAQUE IOHEXOL 300 MG/ML  SOLN MEDICATIONS: 2 g Ancef was administered intravenously within 1 hour of skin incision. TECHNIQUE: Informed consent was obtained from the patient following explanation of the procedure, risks, benefits and alternatives. The patient understands, agrees and consents for the procedure. All questions were addressed. A time out was performed. Maximal barrier sterile technique utilized including caps, mask, sterile gowns, sterile gloves, large sterile drape, hand hygiene, and  chlorhexadine skin prep. An angled catheter was advanced over a wire under fluoroscopic guidance through the nose, down the esophagus and into the body of the stomach. The stomach was then insufflated with several 100 ml of air. Fluoroscopy confirmed location of the gastric bubble, as well as inferior displacement of the barium stained colon. Under direct fluoroscopic guidance, 2 T-tacks were placed, and the anterior gastric wall drawn up against the anterior abdominal wall. Percutaneous access was then obtained into the mid gastric body with an 18 gauge needle. Aspiration of air, and injection of contrast material under fluoroscopy confirmed needle placement. An Amplatz wire was advanced in the gastric body and the access needle exchanged for a 16 French peel-away sheath. A 14 French can group percutaneous gastrostomy tube was then lubricated and advanced through the peel-away sheath. The peel-away sheath was removed. The retention balloon was inflated with 5 mL dilute contrast and saline solution. The balloon was pulled snug against the anterior abdominal wall and the external bumper of fixed in place. The bumper was secured with an 0 Prolene suture. The T tacks were then cut. A gentle hand injection of contrast material through the gastrostomy tube confirmed intragastric location. The tube was then flushed with saline. The patient will be observed for several hours with the newly placed tube on low wall suction to evaluate for any post  procedure complication. The patient tolerated the procedure well, there is no immediate complication. IMPRESSION: Successful placement of a 56 French push in balloon retention gastrostomy tube. Tubal be ready for use tomorrow morning 01/23/2015. Electronically Signed   By: Jacqulynn Cadet M.D.   On: 01/22/2015 15:14   Dg Esophagus  01/20/2015  CLINICAL DATA:  Dysphasia. EXAM: ESOPHOGRAM/BARIUM SWALLOW TECHNIQUE: Single contrast examination was performed using  thick barium.  FLUOROSCOPY TIME:  Radiation Exposure Index (as provided by the fluoroscopic device): 18.9 mGy COMPARISON:  Chest x-ray 08/29/2012. FINDINGS: Cervical esophagus appears normal. A along segment high-grade irregular stricture noted the mid thoracic esophagus. This is worrisome for esophageal malignancy. This results in partial obstruction of the esophagus. Endoscopic evaluation suggested. Incidental note is made of free intraperitoneal air, this is most likely from the patient's recent surgery. Clinical correlation suggested. IMPRESSION: 1. 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 This is worrisome for esophageal malignancy. 2. Free intraperitoneal air, most likely from patient's recent surgery. Critical Value/emergent results were called by telephone at the time of interpretation on 01/20/2015 at 11:47 am to [facility] , who verbally acknowledged these results. Electronically Signed   By: Marcello Moores  Register   On: 01/20/2015 11:52   Nm Pet Image Initial (pi) Skull Base To Thigh  01/23/2015  CLINICAL DATA:  Initial treatment strategy for recently diagnosed mid esophageal squamous cell cancer. EXAM: NUCLEAR MEDICINE PET SKULL BASE TO THIGH TECHNIQUE: 11.87 mCi F-18 FDG was injected intravenously. Full-ring PET imaging was performed from the skull base to thigh after the radiotracer. CT data was obtained and used for attenuation correction and anatomic localization. FASTING BLOOD GLUCOSE:  Value: 68 mg/dl COMPARISON:  Abdominal pelvic CT 08/24/2012 and 08/27/2012. Esophagram 01/20/2015. FINDINGS: NECK There is hypermetabolic left supraclavicular lymphadenopathy. The individual nodes are difficult to measure on the noncontrast CT images, as they are difficult to separate from the adjacent vessels and musculature. Estimated size is up to 2 cm. SUV max is 14.2. There is no contralateral or upper cervical lymphadenopathy.There are no lesions of the pharyngeal mucosal space.  CHEST There is marked hypermetabolic activity within the known in mid esophageal mass. This demonstrates an SUV max of 13.1. There is retained barium within the esophageal lumen which is dilated proximal to this mass. Apart from the mass, no hypermetabolic mediastinal, hilar or axillary lymph nodes identified. There is no suspicious pulmonary activity. Emphysema, biapical scarring, mild bibasilar atelectasis and mild atherosclerosis are noted. ABDOMEN/PELVIS There is no hypermetabolic activity within the liver, adrenal glands, spleen or pancreas. There is no hypermetabolic nodal activity. Retained barium within the colon limits the quality of the examination, especially within the lower abdomen and pelvis. There is a stable small low-density right adrenal lesion consistent with an adenoma. Percutaneous G-tube, diffuse atherosclerosis, residual free intraperitoneal air, presumed pelvic surgical drain and Foley catheter are noted. Some gas within the left intrarenal collecting system is noted. SKELETON There is no hypermetabolic activity to suggest osseous metastatic disease. IMPRESSION: 1. The patient's known mid esophageal cancer is significantly hypermetabolic. There are metastases to left supraclavicular lymph nodes. 2. No other signs of metastatic disease. 3. Follow-up chest CT with contrast may be helpful to better evaluate the local extent of the mid esophageal cancer. 4. Recent postsurgical changes in the pelvis with associated residual pneumoperitoneum. Electronically Signed   By: Richardean Sale M.D.   On: 01/23/2015 14:44    Microbiology: No results found for this or any previous visit (from the  past 240 hour(s)).   Labs: Basic Metabolic Panel:  Recent Labs Lab 01/20/15 1600 01/22/15 0650 01/23/15 0443 01/24/15 0529  NA 143 146* 146* 142  K 3.5 4.3 4.2 4.2  CL 111 118* 117* 113*  CO2 24 20* 23 24  GLUCOSE 88 77 90 112*  BUN 24* 22* 20 18  CREATININE 2.16* 1.96* 1.88* 1.88*  CALCIUM 9.1  8.5* 8.6* 8.7*  MG 1.9  --   --   --   PHOS 2.3*  --   --   --    Liver Function Tests:  Recent Labs Lab 01/22/15 0650 01/24/15 0529  AST 11* 12*  ALT 8* 6*  ALKPHOS 50 45  BILITOT 0.7 0.5  PROT 6.4* 6.3*  ALBUMIN 3.2* 3.0*   No results for input(s): LIPASE, AMYLASE in the last 168 hours. No results for input(s): AMMONIA in the last 168 hours. CBC:  Recent Labs Lab 01/20/15 1600 01/22/15 0650 01/24/15 0529  WBC 8.5 8.9 8.9  HGB 9.5* 9.0* 9.6*  HCT 29.6* 28.4* 28.4*  MCV 86.3 87.6 87.3  PLT 241 235 221   Cardiac Enzymes: No results for input(s): CKTOTAL, CKMB, CKMBINDEX, TROPONINI in the last 168 hours. BNP: BNP (last 3 results) No results for input(s): BNP in the last 8760 hours.  ProBNP (last 3 results) No results for input(s): PROBNP in the last 8760 hours.  CBG:  Recent Labs Lab 01/23/15 1138  GLUCAP 68       Signed:  Itzel Lowrimore   01/24/2015, 12:44 PM

## 2015-01-24 NOTE — Care Management Note (Signed)
Case Management Note  Patient Details  Name: Jeffrey [name] MRN: [id number] Date of Birth: December 08, 1950  Subjective/Objective:   [name] is unable to obtain Jevity 1.5 through local pharmacies, although they can order it for him. [name] only nutrition is Jevity. Discussed with [place] who suggested that [facility] call [facility], give them [name] MRN, and ask them to deliver a case of Jevity 1.5 to 1-[facility]. [name] resides in [place]. His family can pick up the case of Jevity from [facility] today.                  Action/Plan:   Expected Discharge Date:                  Expected Discharge Plan:     In-House Referral:     Discharge planning Services     Post Acute Care Choice:    Choice offered to:     DME Arranged:    DME Agency:     HH Arranged:    [place] Agency:     Status of Service:     Medicare Important Message Given:    Date Medicare IM Given:    Medicare IM give by:    Date Additional Medicare IM Given:    Additional Medicare Important Message give by:     If discussed at [place] of Stay Meetings, dates discussed:    Additional Comments:  [name] A, RN 01/24/2015, 4:20 PM

## 2015-01-24 NOTE — Progress Notes (Signed)
Nutrition Follow-up  DOCUMENTATION CODES:   Severe malnutrition in context of acute illness/injury  INTERVENTION:   EN: Tolerating half can boluses very well this am per RN Tracie. Pt administering TF with assistance of Nsg as needed. Will increase TF to goal rate of 6 full can boluses 248mL of Jevity 1.5 cans at each bolus feeding. MD Hande wanting to discharge pt today, RD answered all questions concerns of pts on visit. Recommended TF at discharge: Jevity 1.5, 6 cans (236mL) per day to provide 2133kcals, 91g protein. Recommend free water flushes of 53mL before and after each bolus and additional free water of 150-261mL two to three times per day to meet hydration needs.    NUTRITION DIAGNOSIS:   Malnutrition related to altered GI function, acute illness as evidenced by percent weight loss, energy intake < or equal to 50% for > or equal to 5 days, moderate depletion of body fat, moderate depletions of muscle mass  GOAL:   Patient will meet greater than or equal to 90% of their needs; ongoing  MONITOR:    (Energy Intake, Electrolyte and Renal Profile, Anthropometrics, Digestive System)  REASON FOR ASSESSMENT:   Consult, Malnutrition Screening Tool Assessment of nutrition requirement/status  ASSESSMENT:   Pt with recent PEG placement.  Diet Order:  Diet NPO time specified Except for: Ice Chips   Current Nutrition: Jevity 1.5 at 153mL bolus feedings tolerating very well with minimal to no residuals per RN   Gastrointestinal Profile: Last BM: 01/23/2015   Scheduled Medications:  . antiseptic oral rinse  7 mL Mouth Rinse BID  . feeding supplement (JEVITY 1.5 CAL/FIBER)  237 mL Per Tube 6 X Daily  . neomycin-bacitracin-polymyxin   Topical Daily  . pantoprazole sodium  40 mg Per Tube BID    Continuous Medications:  . dextrose 50 mL/hr at 01/24/15 0835     Electrolyte/Renal Profile and Glucose Profile:   Recent Labs Lab 01/20/15 1600 01/22/15 0650 01/23/15 0443  01/24/15 0529  NA 143 146* 146* 142  K 3.5 4.3 4.2 4.2  CL 111 118* 117* 113*  CO2 24 20* 23 24  BUN 24* 22* 20 18  CREATININE 2.16* 1.96* 1.88* 1.88*  CALCIUM 9.1 8.5* 8.6* 8.7*  MG 1.9  --   --   --   PHOS 2.3*  --   --   --   GLUCOSE 88 77 90 112*   Protein Profile:  Recent Labs Lab 01/22/15 0650 01/24/15 0529  ALBUMIN 3.2* 3.0*     Weight Trend since Admission: Filed Weights   01/20/15 1332  Weight: 121 lb 6 oz (55.055 kg)     BMI:  Body mass index is 19.01 kg/(m^2).  Estimated Nutritional Needs:   Kcal:  BEE: 1304kcals, TEE: (IF 1.1-1.3)(AF 1.3) 8003-4917HXTAV  Protein:  55-66g protein (1.0-1.2g/kg)  Fluid:  1375-1679mL of fluid (25-82mL/kg)  EDUCATION NEEDS:   Education needs addressed   [place], RD, LDN Pager [phone]

## 2015-01-24 NOTE — Progress Notes (Signed)
Patient ID: Jeffrey [name], male   DOB: 1951/03/03, 64 y.o.   MRN: [id number] SUBJECTIVE:  Pt admitted with progressive dysphagia with solids, with dehydration.  Barium swallow with esophageal stricture. EGD confirms esophageal mass; biopsy with squamous cell carcinoma.  Oncology, GI following; PET Scan : No evidence for metastases.  Underwent G tube placement yesterday  ______________________________________________________________________  ROS: Please see HPI; remainder of complete 10 point ROS is negative   Past Medical History  Diagnosis Date  . Hypertension     Past Surgical History  Procedure Laterality Date  . Bladder removal    . Esophagogastroduodenoscopy (egd) with propofol N/A 01/21/2015    Procedure: ESOPHAGOGASTRODUODENOSCOPY (EGD) WITH PROPOFOL-looking in the esophagus stomach and upper small intestine to evaluate and treat;  Surgeon: [name], MD;  Location: [facility] ENDOSCOPY;  Service: Endoscopy;  Laterality: N/A;     Current facility-administered medications:  .  acetaminophen (TYLENOL) tablet 650 mg, 650 mg, Oral, Q6H PRN **OR** acetaminophen (TYLENOL) suppository 650 mg, 650 mg, Rectal, Q6H PRN, Fritzi Mandes, MD .  antiseptic oral rinse (CPC / CETYLPYRIDINIUM CHLORIDE 0.05%) solution 7 mL, 7 mL, Mouth Rinse, BID, Tama High III, MD, 7 mL at 01/24/15 0835 .  dextrose 5 % solution, , Intravenous, Continuous, Tama High III, MD, Last Rate: 50 mL/hr at 01/24/15 0835 .  feeding supplement (JEVITY 1.5 CAL/FIBER) liquid 237 mL, 237 mL, Per Tube, 6 X Daily, Tracie Harrier, MD .  fludeoxyglucose F - 18 (FDG) injection 11.87 milli Curie, 11.87 milli Curie, Intravenous, Once PRN, Medication Radiologist, MD, 11.87 milli Curie at 01/23/15 1207 .  HYDROmorphone (DILAUDID) injection 1 mg, 1 mg, Intravenous, Q2H PRN, Jacqulynn Cadet, MD, 1 mg at 01/23/15 2241 .  iohexol (OMNIPAQUE) 300 MG/ML solution 30 mL, 30 mL, Oral, Once PRN, Jacqulynn Cadet, MD, 15 mL at 01/22/15  1158 .  morphine 2 MG/ML injection 2 mg, 2 mg, Intravenous, Q2H PRN, Tama High III, MD .  neomycin-bacitracin-polymyxin (NEOSPORIN) ointment, , Topical, Daily, Jacqulynn Cadet, MD .  ondansetron (ZOFRAN) tablet 4 mg, 4 mg, Oral, Q6H PRN **OR** ondansetron (ZOFRAN) injection 4 mg, 4 mg, Intravenous, Q6H PRN, Fritzi Mandes, MD .  ondansetron (ZOFRAN) injection 4 mg, 4 mg, Intravenous, Q4H PRN, Jacqulynn Cadet, MD .  oxyCODONE (ROXICODONE) 5 MG/5ML solution 5 mg, 5 mg, Per Tube, Q6H PRN, Tama High III, MD .  pantoprazole sodium (PROTONIX) 40 mg/20 mL oral suspension 40 mg, 40 mg, Per Tube, BID, Tama High III, MD, 40 mg at 01/24/15 0835  PHYSICAL EXAM:  BP 124/74 mmHg  Pulse 69  Temp(Src) 98.6 F (37 C) (Oral)  Resp 18  Ht 5\' 7"  (1.702 m)  Wt 55.055 kg (121 lb 6 oz)  BMI 19.01 kg/m2  SpO2 100%  General: thin male, in NAD HEENT: PERRL; OP moist without lesions. Neck: supple, trachea midline, no thyromegaly Chest: slight hyperexpansion Lungs: clear bilaterally without retractions or wheezes Cardiovascular: RRR, no murmur, no gallop; distal pulses 2+ Abdomen: soft, nondistended, quiet bowel sounds.  G tube site without drainage.  Tube to neobladder. Extremities: no clubbing, cyanosis, edema Neuro: alert and oriented, moves all extremities Derm: no significant rashes or nodules Lymph: no cervical or supraclavicular lymphadenopathy  Labs and imaging studies were reviewed  ASSESSMENT/PLAN:   1. Esophageal cancer- Pt has good understanding of disase. GI and Oncology following. PET scan; No evidence for metastases 2. CKD stage 3- Cr at baseline; 1.88 3. Bladder cancer with prior bladder resection- pt s/p  reimplantation of right ureter into neobladder last week at [facility]; follow abd exam.   Pt's urine will be chronically contaminated.  4. HTN. Off meds due to relative hypotension 5. Severe protein/calorie malnutrition- appreciate dietary; they will initiate tube feeding and pt  training 6. D/c plan- D/c Home today with Home Health

## 2015-01-24 NOTE — Care Management Note (Signed)
Case Management Note  Patient Details  Name: Jeffrey [name] MRN: [id number] Date of Birth: September 27, 1950  Subjective/Objective:     [name]  Eligibility Form was completed based on the information provided by Jeffrey [name]. This form along with [name] request for home health RN was called and faxed to [place]. [name] has [name].               Action/Plan:   Expected Discharge Date:                  Expected Discharge Plan:     In-House Referral:     Discharge planning Services     Post Acute Care Choice:    Choice offered to:     DME Arranged:    DME Agency:     HH Arranged:    [place] Agency:     Status of Service:     Medicare Important Message Given:    Date Medicare IM Given:    Medicare IM give by:    Date Additional Medicare IM Given:    Additional Medicare Important Message give by:     If discussed at [place] of Stay Meetings, dates discussed:    Additional Comments:  Jeffrey Leonetti A, RN 01/24/2015, 12:52 PM

## 2015-01-27 ENCOUNTER — Telehealth: Payer: Self-pay

## 2015-01-27 NOTE — Telephone Encounter (Signed)
  Oncology Nurse Navigator Documentation  Referral date to RadOnc/MedOnc: 01/27/15 (01/27/15 1400) Navigator Encounter Type: Introductory phone call (01/27/15 1400)                      Time Spent with Patient: 15 (01/27/15 1400)   Notified Jeffrey [name] of appt Friday 10/28 at 10:30 with Dr Genevive Bi and 11:30 with Dr Rogue Bussing. Readback performed. He stated he is doing well and is taking 5 cans of his tube feeding daily through his g tube

## 2015-01-30 ENCOUNTER — Inpatient Hospital Stay: Payer: Self-pay | Attending: Internal Medicine | Admitting: Internal Medicine

## 2015-01-30 ENCOUNTER — Inpatient Hospital Stay (HOSPITAL_BASED_OUTPATIENT_CLINIC_OR_DEPARTMENT_OTHER): Payer: Self-pay | Admitting: Cardiothoracic Surgery

## 2015-01-30 ENCOUNTER — Ambulatory Visit: Payer: Self-pay | Admitting: Cardiothoracic Surgery

## 2015-01-30 ENCOUNTER — Encounter: Payer: Self-pay | Admitting: Internal Medicine

## 2015-01-30 ENCOUNTER — Telehealth: Payer: Self-pay

## 2015-01-30 VITALS — BP 93/67 | HR 69 | Temp 97.7°F | Wt 124.8 lb

## 2015-01-30 DIAGNOSIS — J45909 Unspecified asthma, uncomplicated: Secondary | ICD-10-CM | POA: Insufficient documentation

## 2015-01-30 DIAGNOSIS — F17210 Nicotine dependence, cigarettes, uncomplicated: Secondary | ICD-10-CM | POA: Insufficient documentation

## 2015-01-30 DIAGNOSIS — C770 Secondary and unspecified malignant neoplasm of lymph nodes of head, face and neck: Secondary | ICD-10-CM | POA: Insufficient documentation

## 2015-01-30 DIAGNOSIS — N189 Chronic kidney disease, unspecified: Secondary | ICD-10-CM | POA: Insufficient documentation

## 2015-01-30 DIAGNOSIS — C154 Malignant neoplasm of middle third of esophagus: Secondary | ICD-10-CM

## 2015-01-30 DIAGNOSIS — Z931 Gastrostomy status: Secondary | ICD-10-CM | POA: Insufficient documentation

## 2015-01-30 DIAGNOSIS — J439 Emphysema, unspecified: Secondary | ICD-10-CM | POA: Insufficient documentation

## 2015-01-30 DIAGNOSIS — D649 Anemia, unspecified: Secondary | ICD-10-CM | POA: Insufficient documentation

## 2015-01-30 DIAGNOSIS — I129 Hypertensive chronic kidney disease with stage 1 through stage 4 chronic kidney disease, or unspecified chronic kidney disease: Secondary | ICD-10-CM | POA: Insufficient documentation

## 2015-01-30 DIAGNOSIS — Z8551 Personal history of malignant neoplasm of bladder: Secondary | ICD-10-CM | POA: Insufficient documentation

## 2015-01-30 DIAGNOSIS — Z79899 Other long term (current) drug therapy: Secondary | ICD-10-CM | POA: Insufficient documentation

## 2015-01-30 DIAGNOSIS — R634 Abnormal weight loss: Secondary | ICD-10-CM | POA: Insufficient documentation

## 2015-01-30 NOTE — Progress Notes (Signed)
[facility] NOTE  Patient Care Team: Adin Hector, MD as PCP - General (Internal Medicine) Clent Jacks, RN as Registered Nurse  CHIEF COMPLAINTS/PURPOSE OF CONSULTATION:   # OCT 2016- SQUAMOUS CELL CA of middle esophagus; Stage IV [EGD bx]; PET- Esopahgeal uptake & 2 CM Left Cervical LN; Recm carbo-taxol with RT  # malnutrition s/p PEG tube  # CKD [stage III]; Hx of non-invasive bladder ca [s/p cystectomy with neo-bladder; [facility]]  HISTORY OF PRESENTING ILLNESS:  Jeffrey [name] 64 y.o. African-American male with recent diagnosis of esophageal cancer is here for follow-up accompanied by his daughter. Patient was recently seen in the hospital by GI for ongoing dysphagia- when further workup showed esophageal stricture/mass; EGD biopsy positive for squamous cell carcinoma. PET scan showed- significant uptake in the middle esophagus; and also about 2 cm lymph node in the Left cervical region- concerning for metastases.   Patient currently is using the PEG tube; denies any pain or issues using the tube. No nausea no vomiting. No blood in stools or black stools.  ROS: A complete 10 point review of system is done which is negative except mentioned above in history of present illness  MEDICAL HISTORY:  Past Medical History  Diagnosis Date  . Hypertension   . Bladder cancer ([place])   . Emphysema of lung ([place])   . Asthma   . Lung cancer ([place])   . Chronic kidney disease     SURGICAL HISTORY: Past Surgical History  Procedure Laterality Date  . Bladder removal    . Esophagogastroduodenoscopy (egd) with propofol N/A 01/21/2015    Procedure: ESOPHAGOGASTRODUODENOSCOPY (EGD) WITH PROPOFOL-looking in the esophagus stomach and upper small intestine to evaluate and treat;  Surgeon: [name], MD;  Location: [facility] ENDOSCOPY;  Service: Endoscopy;  Laterality: N/A;    SOCIAL HISTORY: Social History   Social History  . Marital Status: Married    Spouse Name:  N/A  . Number of Children: N/A  . Years of Education: N/A   Occupational History  . Not on file.   Social History Main Topics  . Smoking status: Current Every Day Smoker -- 0.50 packs/day    Types: Cigarettes  . Smokeless tobacco: Current User    Types: Chew  . Alcohol Use: No  . Drug Use: No  . Sexual Activity: Not on file   Other Topics Concern  . Not on file   Social History Narrative    FAMILY HISTORY: Family History  Problem Relation Age of Onset  . Hypertension Other     ALLERGIES:  is allergic to aspirin.  MEDICATIONS:  Current Outpatient Prescriptions  Medication Sig Dispense Refill  . acetaminophen (TYLENOL) 325 MG tablet Take by mouth.    Marland Kitchen amLODipine (NORVASC) 2.5 MG tablet Take by mouth.    . diphenhydrAMINE (BENADRYL) 25 mg capsule Take by mouth.    . docusate sodium (COLACE) 100 MG capsule Take 100 mg by mouth.    . neomycin-bacitracin-polymyxin (NEOSPORIN) OINT Apply 1 application topically daily. 30 g 1  . Nutritional Supplements (FEEDING SUPPLEMENT, JEVITY 1.5 CAL/FIBER,) LIQD Place 237 mLs into feeding tube 6 (six) times daily. 1000 mL 5  . oxyCODONE (ROXICODONE) 5 MG/5ML solution Place 5 mLs (5 mg total) into feeding tube every 6 (six) hours as needed for severe pain. 15 mL 0  . oxyCODONE (ROXICODONE) 5 MG/5ML solution Take by mouth.    . pantoprazole sodium (PROTONIX) 40 mg/20 mL PACK Place 20 mLs (40  mg total) into feeding tube 2 (two) times daily. 30 each 3  . senna (SENOKOT) 8.6 MG TABS tablet Take by mouth.     No current facility-administered medications for this visit.      Marland Kitchen  PHYSICAL EXAMINATION: ECOG PERFORMANCE STATUS: 1 - Symptomatic but completely ambulatory  Filed Vitals:   01/30/15 1046  BP: 93/67  Pulse: 69  Temp: 97.7 F (36.5 C)   Filed Weights   01/30/15 1046  Weight: 124 lb 12.5 oz (56.6 kg)    GENERAL: Thin built; moderately nourished. Alert, no distress and comfortable.   Accompanied by his daughter. EYES: no  pallor or icterus OROPHARYNX: no thrush or ulceration; good dentition  NECK: supple, no masses felt LYMPH:  ~2-3 CM palpable lymphadenopathy in the cervical on left side [? Level II] LUNGS: clear to auscultation and  No wheeze or crackles HEART/CVS: regular rate & rhythm and no murmurs; No lower extremity edema ABDOMEN: abdomen soft, non-tender and normal bowel sounds; PEG tube in place Musculoskeletal:no cyanosis of digits and no clubbing  PSYCH: alert & oriented x 3 with fluent speech NEURO: no focal motor/sensory deficits SKIN:  no rashes or significant lesions  LABORATORY DATA:  I have reviewed the data as listed Lab Results  Component Value Date   WBC 8.9 01/24/2015   HGB 9.6* 01/24/2015   HCT 28.4* 01/24/2015   MCV 87.3 01/24/2015   PLT 221 01/24/2015    Recent Labs  09/11/14 1113  01/22/15 0650 01/23/15 0443 01/24/15 0529  NA 139  < > 146* 146* 142  K 4.1  < > 4.3 4.2 4.2  CL 116*  < > 118* 117* 113*  CO2 17*  < > 20* 23 24  GLUCOSE 107*  < > 77 90 112*  BUN 48*  < > 22* 20 18  CREATININE 2.86*  < > 1.96* 1.88* 1.88*  CALCIUM 8.7*  < > 8.5* 8.6* 8.7*  GFRNONAA 22*  < > 35* 37* 37*  GFRAA 26*  < > 40* 43* 43*  PROT  --   --  6.4*  --  6.3*  ALBUMIN 3.9  --  3.2*  --  3.0*  AST  --   --  11*  --  12*  ALT  --   --  8*  --  6*  ALKPHOS  --   --  50  --  45  BILITOT  --   --  0.7  --  0.5  < > = values in this interval not displayed.  RADIOGRAPHIC STUDIES: I have personally reviewed the radiological images as listed and agreed with the findings in the report. Dg Chest 2 View  01/21/2015  CLINICAL DATA:  Dysphagia. Follow-up contrast from barium swallow performed yesterday. Recent surgery for bladder cancer. EXAM: CHEST  2 VIEW COMPARISON:  01/20/2015 barium swallow FINDINGS: There is residual barium in the thoracic esophagus proximal to an irregular esophageal stricture as described on yesterday's barium swallow. Cardiac silhouette is within normal limits. The  lungs are hyperinflated without evidence of airspace consolidation, edema, pleural effusion, or pneumothorax. Free intraperitoneal air is again seen under the hemidiaphragms, similar to the prior study and likely related to recent surgery. Residual oral contrast is partially visualized in the splenic flexure of the colon. No acute osseous abnormality is seen. IMPRESSION: 1. Residual barium in the proximal to mid thoracic esophagus. 2. Clear lungs. 3. Unchanged intraperitoneal free air. Electronically Signed   By: Seymour Bars.D.  On: 01/21/2015 08:18   Ir Gastrostomy Tube Mod Sed  01/22/2015  CLINICAL DATA:  64 year old male with near obstructing mid esophageal carcinoma. EXAM: PERC PLACEMENT GASTROSTOMY Date: 01/22/2015 PROCEDURE: 1. Fluoroscopically guided placement of percutaneous balloon retention gastrostomy tube. Interventional Radiologist:  Criselda Peaches, MD ANESTHESIA/SEDATION: Moderate (conscious) sedation was used. 4 mg Versed, 200 mcg Fentanyl were administered intravenously. The patient's vital signs were monitored continuously by radiology nursing throughout the procedure. Sedation Time: 48 minutes FLUOROSCOPY TIME:  5 minutes 54 seconds CONTRAST:  1 OMNIPAQUE IOHEXOL 300 MG/ML  SOLN MEDICATIONS: 2 g Ancef was administered intravenously within 1 hour of skin incision. TECHNIQUE: Informed consent was obtained from the patient following explanation of the procedure, risks, benefits and alternatives. The patient understands, agrees and consents for the procedure. All questions were addressed. A time out was performed. Maximal barrier sterile technique utilized including caps, mask, sterile gowns, sterile gloves, large sterile drape, hand hygiene, and chlorhexadine skin prep. An angled catheter was advanced over a wire under fluoroscopic guidance through the nose, down the esophagus and into the body of the stomach. The stomach was then insufflated with several 100 ml of air. Fluoroscopy  confirmed location of the gastric bubble, as well as inferior displacement of the barium stained colon. Under direct fluoroscopic guidance, 2 T-tacks were placed, and the anterior gastric wall drawn up against the anterior abdominal wall. Percutaneous access was then obtained into the mid gastric body with an 18 gauge needle. Aspiration of air, and injection of contrast material under fluoroscopy confirmed needle placement. An Amplatz wire was advanced in the gastric body and the access needle exchanged for a 16 French peel-away sheath. A 14 French can group percutaneous gastrostomy tube was then lubricated and advanced through the peel-away sheath. The peel-away sheath was removed. The retention balloon was inflated with 5 mL dilute contrast and saline solution. The balloon was pulled snug against the anterior abdominal wall and the external bumper of fixed in place. The bumper was secured with an 0 Prolene suture. The T tacks were then cut. A gentle hand injection of contrast material through the gastrostomy tube confirmed intragastric location. The tube was then flushed with saline. The patient will be observed for several hours with the newly placed tube on low wall suction to evaluate for any post procedure complication. The patient tolerated the procedure well, there is no immediate complication. IMPRESSION: Successful placement of a 73 French push in balloon retention gastrostomy tube. Tubal be ready for use tomorrow morning 01/23/2015. Electronically Signed   By: Jacqulynn Cadet M.D.   On: 01/22/2015 15:14   Dg Esophagus  01/20/2015  CLINICAL DATA:  Dysphasia. EXAM: ESOPHOGRAM/BARIUM SWALLOW TECHNIQUE: Single contrast examination was performed using  thick barium. FLUOROSCOPY TIME:  Radiation Exposure Index (as provided by the fluoroscopic device): 18.9 mGy COMPARISON:  Chest x-ray 08/29/2012. FINDINGS: Cervical esophagus appears normal. A along segment high-grade irregular stricture noted the mid  thoracic esophagus. This is worrisome for esophageal malignancy. This results in partial obstruction of the esophagus. Endoscopic evaluation suggested. Incidental note is made of free intraperitoneal air, this is most likely from the patient's recent surgery. Clinical correlation suggested. IMPRESSION: 1. 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 This is worrisome for esophageal malignancy. 2. Free intraperitoneal air, most likely from patient's recent surgery. Critical Value/emergent results were called by telephone at the time of interpretation on 01/20/2015 at 11:47 am to [facility] , who verbally acknowledged these results. Electronically Signed  By: Ferney   On: 01/20/2015 11:52   Nm Pet Image Initial (pi) Skull Base To Thigh  01/23/2015  CLINICAL DATA:  Initial treatment strategy for recently diagnosed mid esophageal squamous cell cancer. EXAM: NUCLEAR MEDICINE PET SKULL BASE TO THIGH TECHNIQUE: 11.87 mCi F-18 FDG was injected intravenously. Full-ring PET imaging was performed from the skull base to thigh after the radiotracer. CT data was obtained and used for attenuation correction and anatomic localization. FASTING BLOOD GLUCOSE:  Value: 68 mg/dl COMPARISON:  Abdominal pelvic CT 08/24/2012 and 08/27/2012. Esophagram 01/20/2015. FINDINGS: NECK There is hypermetabolic left supraclavicular lymphadenopathy. The individual nodes are difficult to measure on the noncontrast CT images, as they are difficult to separate from the adjacent vessels and musculature. Estimated size is up to 2 cm. SUV max is 14.2. There is no contralateral or upper cervical lymphadenopathy.There are no lesions of the pharyngeal mucosal space. CHEST There is marked hypermetabolic activity within the known in mid esophageal mass. This demonstrates an SUV max of 13.1. There is retained barium within the esophageal lumen which is dilated proximal to this mass. Apart from the mass, no  hypermetabolic mediastinal, hilar or axillary lymph nodes identified. There is no suspicious pulmonary activity. Emphysema, biapical scarring, mild bibasilar atelectasis and mild atherosclerosis are noted. ABDOMEN/PELVIS There is no hypermetabolic activity within the liver, adrenal glands, spleen or pancreas. There is no hypermetabolic nodal activity. Retained barium within the colon limits the quality of the examination, especially within the lower abdomen and pelvis. There is a stable small low-density right adrenal lesion consistent with an adenoma. Percutaneous G-tube, diffuse atherosclerosis, residual free intraperitoneal air, presumed pelvic surgical drain and Foley catheter are noted. Some gas within the left intrarenal collecting system is noted. SKELETON There is no hypermetabolic activity to suggest osseous metastatic disease. IMPRESSION: 1. The patient's known mid esophageal cancer is significantly hypermetabolic. There are metastases to left supraclavicular lymph nodes. 2. No other signs of metastatic disease. 3. Follow-up chest CT with contrast may be helpful to better evaluate the local extent of the mid esophageal cancer. 4. Recent postsurgical changes in the pelvis with associated residual pneumoperitoneum. Electronically Signed   By: Richardean Sale M.D.   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 I reviewed the staging/pathology with the patient and his daughter. I would recommend a biopsy of the left cervical lymph node that will help [place] confirm the staging.  # I would recommend carboplatin and Taxol weekly along with radiation. The goal of treatment would be palliative/local control. Patient might benefit from "consolidation chemotherapy" after his concurrent chemoradiation therapy. A staging scan would be done after the treatment to reevaluate the status of the disease.  Patient has an  appointment with radiation oncology in the next 2-3 days. Discussed the general radiation schedule; chemotherapy schedule- most likely will be getting treatment for 6-7 weeks.  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 Discussed the use of Procrit; and also the concerns of progression of underlying malignancy on Procrit. For now hold off any Procrit at this time. However if the anemia worsens use of Procrit would be recommended.   # Spoken to Dr. Faith Rogue; regarding the above plan. He agrees.  Recommend chemotherapy education. I also recommend a port placement. Hopefully we will get started with the treatment in next 2 weeks  or so.  All questions were answered. The patient knows to call the clinic with any problems, questions or concerns.   I spent 30 minutes counseling the patient face to face. The total time spent in the appointment was 40 minutes and more than 50% was on counseling.     Cammie Sickle, MD 01/30/2015 11:32 AM

## 2015-01-30 NOTE — Telephone Encounter (Signed)
  Oncology Nurse Navigator Documentation    Navigator Encounter Type: Telephone (01/30/15 1600)         Interventions: Referrals;Coordination of Care (01/30/15 1600)            Time Spent with Patient: 30 (01/30/15 1600)   [facility] regarding his referral home home health Rn. They stated they attempted to call him several times and left messages and no one returned their call. Therefore his case was closed. He now needs new order to open his case. Mr Heron Nay was notified of this and I have sent a message to his PCP Dr Caryl Comes regarding his need for new orders. Will follow up.

## 2015-01-30 NOTE — Progress Notes (Signed)
  Oncology Nurse Navigator Documentation    Navigator Encounter Type: Initial MedOnc (01/30/15 1300) Patient Visit Type: Medonc (01/30/15 1300) Treatment Phase: Other (New Esophageal pathology) (01/30/15 1300)                  Time Spent with Patient: 45 (01/30/15 1300)   Provided appt with Dr Baruch Gouty 10/31 at 10:30 for XRT consult. Reports home health agency has not been in contact with him. Will research where the referral process is.

## 2015-01-30 NOTE — Progress Notes (Signed)
Patient not a candidate for surgery.  Dr. Genevive Bi to place port a cath and biopsy lymph nodes on left side of neck

## 2015-01-30 NOTE — Progress Notes (Signed)
Patient ID: Jeffrey [name], male   DOB: [birth year], 64 y.o.   MRN: [id number]  No chief complaint on file.   Referred By Dr. Ephraim Hamburger Reason for Referral Port-A-Cath insertion  HPI Location, Quality, Duration, Severity, Timing, Context, Modifying Factors, Associated Signs and Symptoms.  Jeffrey [name] is a 64 y.o. male.  This is a 64 year old gentleman with a prior history of tobacco use and alcohol use who had a unexplained 40 pound weight loss over the last 6 months. Complete evaluation ultimately revealed a mid esophageal carcinoma. Biopsies were consistent with squamous cell. Staging of his carcinoma revealed metastases to his left neck and he was felt to be a poor candidate for surgical resection. Patient presents today for options. He did undergo percutaneous gastrostomy in interventional radiology. At the time of his original endoscopy they could not pass the scope into the stomach. He states he is able to drink some liquids and now uses Jevity for his internal nutrition. He has had a extensive evaluation with laboratory studies CT scans and PET scans. He is slightly anemic with hematocrit at 29%.   Past Medical History  Diagnosis Date  . Hypertension   . Bladder cancer ([place])   . Emphysema of lung ([place])   . Asthma   . Lung cancer ([place])   . Chronic kidney disease     Past Surgical History  Procedure Laterality Date  . Bladder removal    . Esophagogastroduodenoscopy (egd) with propofol N/A 01/21/2015    Procedure: ESOPHAGOGASTRODUODENOSCOPY (EGD) WITH PROPOFOL-looking in the esophagus stomach and upper small intestine to evaluate and treat;  Surgeon: [name], MD;  Location: [facility] ENDOSCOPY;  Service: Endoscopy;  Laterality: N/A;    Family History  Problem Relation Age of Onset  . Hypertension Other     Social History Social History  Substance Use Topics  . Smoking status: Current Every Day Smoker -- 0.50 packs/day    Types: Cigarettes  . Smokeless tobacco: Current  User    Types: Chew  . Alcohol Use: No    Allergies  Allergen Reactions  . Aspirin Nausea Only    Current Outpatient Prescriptions  Medication Sig Dispense Refill  . acetaminophen (TYLENOL) 325 MG tablet Take by mouth.    Marland Kitchen amLODipine (NORVASC) 2.5 MG tablet Take by mouth.    . diphenhydrAMINE (BENADRYL) 25 mg capsule Take by mouth.    . docusate sodium (COLACE) 100 MG capsule Take 100 mg by mouth.    . neomycin-bacitracin-polymyxin (NEOSPORIN) OINT Apply 1 application topically daily. 30 g 1  . Nutritional Supplements (FEEDING SUPPLEMENT, JEVITY 1.5 CAL/FIBER,) LIQD Place 237 mLs into feeding tube 6 (six) times daily. 1000 mL 5  . oxyCODONE (ROXICODONE) 5 MG/5ML solution Place 5 mLs (5 mg total) into feeding tube every 6 (six) hours as needed for severe pain. 15 mL 0  . oxyCODONE (ROXICODONE) 5 MG/5ML solution Take by mouth.    . pantoprazole sodium (PROTONIX) 40 mg/20 mL PACK Place 20 mLs (40 mg total) into feeding tube 2 (two) times daily. 30 each 3  . senna (SENOKOT) 8.6 MG TABS tablet Take by mouth.     No current facility-administered medications for this visit.      Review of Systems A complete review of systems was asked and was negative except for the following positive findings significant weight loss. Dysphagia.  There were no vitals taken for this visit.  Physical Exam CONSTITUTIONAL:  Pleasant, thin, cachectic, and in no acute distress. EYES: Pupils equal  and reactive to light, Sclera non-icteric EARS, NOSE, MOUTH AND THROAT:  The oropharynx was clear.  Dentition is poorrepair.  Oral mucosa pink and moist. LYMPH NODES:  Lymph nodes in the neck and axillae were normal with the exception of a fixed 1 cm lymph node in the mid left neck just underneath the left external jugular vein RESPIRATORY:  Lungs were clear.  Normal respiratory effort without pathologic use of accessory muscles of respiration CARDIOVASCULAR: Heart was regular without murmurs.  There were no  carotid bruits. GI: The abdomen was soft, nontender, and nondistended. There were no palpable masses. There was no hepatosplenomegaly. There were normal bowel sounds in all quadrants.  PEG tube present GU:  Rectal deferred.   MUSCULOSKELETAL:  Normal muscle strength and tone.  No clubbing or cyanosis.   SKIN:  There were no pathologic skin lesions.  There were no nodules on palpation. PSYCH:  Oriented to person, place and time.  Mood and affect are normal.  Data Reviewed CT scan and PET scan  I have personally reviewed the patient's imaging, laboratory findings and medical records.    Assessment    I have independently reviewed the patient's CT scan and PET scan. We discussed his care at our multidisciplinary clinic. I do not believe the patient is a candidate for surgical resection.  I had a long discussion with him and his daughter who is a Conservation officer, historic buildings regarding the management. I would like to biopsy the lymph node in the left neck and place a Port-A-Cath. I explained to them the indications and risks. We usually put the Port-A-Cath on the right side patients aware that. Disks of bleeding, infection and pneumothorax were discussed.    Plan    I do not believe the patient is a candidate for definitive surgical resection. I would recommend radiation therapy and chemotherapy. We will go ahead and set him up for a lymph node biopsy and Port-A-Cath placement.       [name], MD 01/30/2015, 12:10 PM

## 2015-02-02 ENCOUNTER — Ambulatory Visit
Admission: RE | Admit: 2015-02-02 | Discharge: 2015-02-02 | Disposition: A | Discharge status: Home / Self Care | Payer: Self-pay | Source: Ambulatory Visit | Attending: Radiation Oncology | Admitting: Radiation Oncology

## 2015-02-02 ENCOUNTER — Encounter: Payer: Self-pay | Admitting: Radiation Oncology

## 2015-02-02 VITALS — BP 113/67 | HR 71 | Temp 98.2°F | Wt 128.2 lb

## 2015-02-02 DIAGNOSIS — F17200 Nicotine dependence, unspecified, uncomplicated: Secondary | ICD-10-CM | POA: Insufficient documentation

## 2015-02-02 DIAGNOSIS — F17210 Nicotine dependence, cigarettes, uncomplicated: Secondary | ICD-10-CM | POA: Insufficient documentation

## 2015-02-02 DIAGNOSIS — J45909 Unspecified asthma, uncomplicated: Secondary | ICD-10-CM | POA: Insufficient documentation

## 2015-02-02 DIAGNOSIS — R1310 Dysphagia, unspecified: Secondary | ICD-10-CM | POA: Insufficient documentation

## 2015-02-02 DIAGNOSIS — Z8551 Personal history of malignant neoplasm of bladder: Secondary | ICD-10-CM | POA: Insufficient documentation

## 2015-02-02 DIAGNOSIS — N189 Chronic kidney disease, unspecified: Secondary | ICD-10-CM | POA: Insufficient documentation

## 2015-02-02 DIAGNOSIS — Z510 Encounter for antineoplastic radiation therapy: Secondary | ICD-10-CM | POA: Insufficient documentation

## 2015-02-02 DIAGNOSIS — I129 Hypertensive chronic kidney disease with stage 1 through stage 4 chronic kidney disease, or unspecified chronic kidney disease: Secondary | ICD-10-CM | POA: Insufficient documentation

## 2015-02-02 DIAGNOSIS — J439 Emphysema, unspecified: Secondary | ICD-10-CM | POA: Insufficient documentation

## 2015-02-02 DIAGNOSIS — Z931 Gastrostomy status: Secondary | ICD-10-CM | POA: Insufficient documentation

## 2015-02-02 DIAGNOSIS — Z79899 Other long term (current) drug therapy: Secondary | ICD-10-CM | POA: Insufficient documentation

## 2015-02-02 DIAGNOSIS — C770 Secondary and unspecified malignant neoplasm of lymph nodes of head, face and neck: Secondary | ICD-10-CM | POA: Insufficient documentation

## 2015-02-02 DIAGNOSIS — C154 Malignant neoplasm of middle third of esophagus: Secondary | ICD-10-CM | POA: Insufficient documentation

## 2015-02-02 NOTE — Progress Notes (Signed)
  Oncology Nurse Navigator Documentation    Navigator Encounter Type: Initial RadOnc (02/02/15 1300) Patient Visit Type: [id number] (02/02/15 1300)                    Time Spent with Patient: 45 (02/02/15 1300)   Mr Jeffrey [name] has all upcoming appts. He will call back if he has not heard from home health by Tuesday afternoon.

## 2015-02-02 NOTE — Consult Note (Signed)
Except an outstanding is perfect of Radiation Oncology NEW PATIENT EVALUATION  Name: Jeffrey [name]  MRN: [id number]  Date:   02/02/2015     DOB: [birth year]   This 64 y.o. 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  REFERRING PHYSICIAN: Adin Hector, MD  CHIEF COMPLAINT:  Chief Complaint  Patient presents with  . Esophageal Cancer    consult    DIAGNOSIS: The encounter diagnosis was Malignant neoplasm of middle third of esophagus ([place]).   PREVIOUS INVESTIGATIONS:  PET CT scan is reviewed Pathology report reviewed Clinical notes reviewed  HPI: Patient is a 64 year old male presenting with increasing dysphagia mentioned had upper endoscopy showing mass in the midesophagus. Biopsy was positive for invasive keratinizing squamous cell carcinoma. Complete workup including PET CT scan demonstrated hypermetabolic activity in the midesophagus consistent with known malignancy also the was a left cervical and left supraclavicular lymph node showing hypermetabolic activity compatible with metastatic disease. Patient is scheduled for port placement as well as biopsy of his cervical node this week. Patient is had a PEG tube placed and seems to be receiving sufficient nutrition. Patient is able to drink liquids no solid food. He's having no other pain at this time.  PLANNED TREATMENT REGIMEN: Palliative radiation therapy along with chemotherapy  PAST MEDICAL HISTORY:  has a past medical history of Hypertension; Bladder cancer ([place]); Emphysema of lung ([place]); Asthma; Lung cancer ([place]); and Chronic kidney disease.    PAST SURGICAL HISTORY:  Past Surgical History  Procedure Laterality Date  . Bladder removal    . Esophagogastroduodenoscopy (egd) with propofol N/A 01/21/2015    Procedure: ESOPHAGOGASTRODUODENOSCOPY (EGD) WITH PROPOFOL-looking in the esophagus stomach and  upper small intestine to evaluate and treat;  Surgeon: [name], MD;  Location: [facility] ENDOSCOPY;  Service: Endoscopy;  Laterality: N/A;    FAMILY HISTORY: family history includes Hypertension in his other.  SOCIAL HISTORY:  reports that he has been smoking Cigarettes.  He has been smoking about 0.50 packs per day. His smokeless tobacco use includes Chew. He reports that he does not drink alcohol or use illicit drugs.  ALLERGIES: Aspirin  MEDICATIONS:  Current Outpatient Prescriptions  Medication Sig Dispense Refill  . acetaminophen (TYLENOL) 325 MG tablet Take by mouth.    Marland Kitchen amLODipine (NORVASC) 2.5 MG tablet Take by mouth.    . diphenhydrAMINE (BENADRYL) 25 mg capsule Take by mouth.    . docusate sodium (COLACE) 100 MG capsule Take 100 mg by mouth.    . neomycin-bacitracin-polymyxin (NEOSPORIN) OINT Apply 1 application topically daily. 30 g 1  . Nutritional Supplements (FEEDING SUPPLEMENT, JEVITY 1.5 CAL/FIBER,) LIQD Place 237 mLs into feeding tube 6 (six) times daily. 1000 mL 5  . oxyCODONE (ROXICODONE) 5 MG/5ML solution Place 5 mLs (5 mg total) into feeding tube every 6 (six) hours as needed for severe pain. 15 mL 0  . oxyCODONE (ROXICODONE) 5 MG/5ML solution Take by mouth.    . pantoprazole sodium (PROTONIX) 40 mg/20 mL PACK Place 20 mLs (40 mg total) into feeding tube 2 (two) times daily. 30 each 3  . senna (SENOKOT) 8.6 MG TABS tablet Take by mouth.     No current facility-administered medications for this encounter.    ECOG PERFORMANCE STATUS:  1 - Symptomatic but completely ambulatory  REVIEW OF SYSTEMS: Except for the dysphasia Patient denies any weight loss, fatigue, weakness, fever, chills  or night sweats. Patient denies any loss of vision, blurred vision. Patient denies any ringing  of the ears or hearing loss. No irregular heartbeat. Patient denies heart murmur or history of fainting. Patient denies any chest pain or pain radiating to her upper extremities. Patient  denies any shortness of breath, difficulty breathing at night, cough or hemoptysis. Patient denies any swelling in the lower legs. Patient denies any nausea vomiting, vomiting of blood, or coffee ground material in the vomitus. Patient denies any stomach pain. Patient states has had normal bowel movements no significant constipation or diarrhea. Patient denies any dysuria, hematuria or significant nocturia. Patient denies any problems walking, swelling in the joints or loss of balance. Patient denies any skin changes, loss of hair or loss of weight. Patient denies any excessive worrying or anxiety or significant depression. Patient denies any problems with insomnia. Patient denies excessive thirst, polyuria, polydipsia. Patient denies any swollen glands, patient denies easy bruising or easy bleeding. Patient denies any recent infections, allergies or URI. Patient "s visual fields have not changed significantly in recent time.   PHYSICAL EXAM: BP 113/67 mmHg  Pulse 71  Temp(Src) 98.2 F (36.8 C)  Wt 128 lb 3.2 oz (58.15 kg) Thin cachectic male in NAD. Patient does have a PEG tube placed which is functioning well. Does have a prominent proximal 2 cm node in the left mid to upper cervical chain. Well-developed well-nourished patient in NAD. HEENT reveals PERLA, EOMI, discs not visualized.  Oral cavity is clear. No oral mucosal lesions are identified. Neck is clear without evidence of cervical or supraclavicular adenopathy. Lungs are clear to A&P. Cardiac examination is essentially unremarkable with regular rate and rhythm without murmur rub or thrill. Abdomen is benign with no organomegaly or masses noted. Motor sensory and DTR levels are equal and symmetric in the upper and lower extremities. Cranial nerves II through XII are grossly intact. Proprioception is intact. No peripheral adenopathy or edema is identified. No motor or sensory levels are noted. Crude visual fields are within normal  range.  LABORATORY DATA: Pathology report is reviewed compatible above-stated findings    RADIOLOGY RESULTS: PET CT scan is reviewed compatible with the above-stated findings   IMPRESSION: Stage IV based on cervical lymph node involvement of presumed squamous cell carcinoma the midesophagus in 64 year old male for palliative radiation therapy and concurrent chemotherapy  PLAN: At this time I to go ahead with radiation therapy for palliative mode to his esophagus. Would plan on delivering 5000 cGy over 5 weeks. I would prefer to use I MRT treatment planning and delivery to spare critical structures such as his heart lungs and spinal cord. Risks and benefits of treatment including possible dysphasia secondary radiation esophagitis, skin reaction, fatigue, possible injury to normal lung all were discussed in detail with the patient. He seems to comprehend my treatment plan well. I have set him up for CT simulation and ordered that for later this week. I've encouraged him to continue using the PEG tube for nutritional support.  I would like to take this opportunity for allowing me to participate in the care of your patient.Armstead Peaks., MD

## 2015-02-03 ENCOUNTER — Telehealth: Payer: Self-pay

## 2015-02-03 NOTE — Telephone Encounter (Signed)
  Oncology Nurse Navigator Documentation    Navigator Encounter Type: Telephone (02/03/15 1500) Patient Visit Type: Follow-up (02/03/15 1500)       Interventions: Referrals (02/03/15 1500)            Time Spent with Patient: 30 (02/03/15 1500)   Received call from Mr Korn. He has not heard from [facility] regarding home health. Contacted [facility] and they have not received a new order referral. Talked with Dr Aquilla Hacker nurse who will let Dr Caryl Comes know and start the order process. Will continue to follow up on this.

## 2015-02-04 ENCOUNTER — Ambulatory Visit: Payer: Self-pay

## 2015-02-04 ENCOUNTER — Encounter: Payer: Self-pay | Admitting: *Deleted

## 2015-02-04 ENCOUNTER — Ambulatory Visit: Payer: Self-pay | Admitting: Anesthesiology

## 2015-02-04 ENCOUNTER — Telehealth: Payer: Self-pay

## 2015-02-04 ENCOUNTER — Encounter
Admission: RE | Disposition: A | Discharge status: Home / Self Care | Payer: Self-pay | Source: Ambulatory Visit | Attending: Cardiothoracic Surgery

## 2015-02-04 ENCOUNTER — Other Ambulatory Visit: Payer: Self-pay | Admitting: Internal Medicine

## 2015-02-04 ENCOUNTER — Ambulatory Visit
Admission: RE | Admit: 2015-02-04 | Discharge: 2015-02-04 | Disposition: A | Discharge status: Home / Self Care | Payer: Self-pay | Source: Ambulatory Visit | Attending: Cardiothoracic Surgery | Admitting: Cardiothoracic Surgery

## 2015-02-04 DIAGNOSIS — C154 Malignant neoplasm of middle third of esophagus: Secondary | ICD-10-CM

## 2015-02-04 DIAGNOSIS — Z8249 Family history of ischemic heart disease and other diseases of the circulatory system: Secondary | ICD-10-CM | POA: Insufficient documentation

## 2015-02-04 DIAGNOSIS — F17210 Nicotine dependence, cigarettes, uncomplicated: Secondary | ICD-10-CM | POA: Insufficient documentation

## 2015-02-04 DIAGNOSIS — Z8551 Personal history of malignant neoplasm of bladder: Secondary | ICD-10-CM | POA: Insufficient documentation

## 2015-02-04 DIAGNOSIS — Z09 Encounter for follow-up examination after completed treatment for conditions other than malignant neoplasm: Secondary | ICD-10-CM

## 2015-02-04 DIAGNOSIS — K219 Gastro-esophageal reflux disease without esophagitis: Secondary | ICD-10-CM | POA: Insufficient documentation

## 2015-02-04 DIAGNOSIS — N189 Chronic kidney disease, unspecified: Secondary | ICD-10-CM | POA: Insufficient documentation

## 2015-02-04 DIAGNOSIS — R634 Abnormal weight loss: Secondary | ICD-10-CM | POA: Insufficient documentation

## 2015-02-04 DIAGNOSIS — Z886 Allergy status to analgesic agent status: Secondary | ICD-10-CM | POA: Insufficient documentation

## 2015-02-04 DIAGNOSIS — J439 Emphysema, unspecified: Secondary | ICD-10-CM | POA: Insufficient documentation

## 2015-02-04 DIAGNOSIS — C159 Malignant neoplasm of esophagus, unspecified: Secondary | ICD-10-CM | POA: Insufficient documentation

## 2015-02-04 DIAGNOSIS — C770 Secondary and unspecified malignant neoplasm of lymph nodes of head, face and neck: Secondary | ICD-10-CM | POA: Insufficient documentation

## 2015-02-04 DIAGNOSIS — I10 Essential (primary) hypertension: Secondary | ICD-10-CM | POA: Insufficient documentation

## 2015-02-04 DIAGNOSIS — Z6820 Body mass index (BMI) 20.0-20.9, adult: Secondary | ICD-10-CM | POA: Insufficient documentation

## 2015-02-04 DIAGNOSIS — Z79899 Other long term (current) drug therapy: Secondary | ICD-10-CM | POA: Insufficient documentation

## 2015-02-04 DIAGNOSIS — R1310 Dysphagia, unspecified: Secondary | ICD-10-CM | POA: Insufficient documentation

## 2015-02-04 HISTORY — PX: PORTACATH PLACEMENT: SHX2246

## 2015-02-04 HISTORY — PX: LYMPH NODE BIOPSY: SHX201

## 2015-02-04 LAB — COMPREHENSIVE METABOLIC PANEL
ALBUMIN: 4.1 g/dL (ref 3.5–5.0)
ALT: 9 U/L — ABNORMAL LOW (ref 17–63)
ANION GAP: 10 (ref 5–15)
AST: 21 U/L (ref 15–41)
Alkaline Phosphatase: 62 U/L (ref 38–126)
BUN: 39 mg/dL — ABNORMAL HIGH (ref 6–20)
CHLORIDE: 100 mmol/L — AB (ref 101–111)
CO2: 27 mmol/L (ref 22–32)
Calcium: 8.7 mg/dL — ABNORMAL LOW (ref 8.9–10.3)
Creatinine, Ser: 2.14 mg/dL — ABNORMAL HIGH (ref 0.61–1.24)
GFR calc Af Amer: 36 mL/min — ABNORMAL LOW (ref >60)
GFR calc non Af Amer: 31 mL/min — ABNORMAL LOW (ref >60)
GLUCOSE: 94 mg/dL (ref 65–99)
POTASSIUM: 4.6 mmol/L (ref 3.5–5.1)
SODIUM: 137 mmol/L (ref 135–145)
TOTAL PROTEIN: 7.8 g/dL (ref 6.5–8.1)
Total Bilirubin: 0.9 mg/dL (ref 0.3–1.2)

## 2015-02-04 LAB — CBC
HCT: 31 % — ABNORMAL LOW (ref 40.0–52.0)
Hemoglobin: 10 g/dL — ABNORMAL LOW (ref 13.0–18.0)
MCH: 27.9 pg (ref 26.0–34.0)
MCHC: 32.3 g/dL (ref 32.0–36.0)
MCV: 86.3 fL (ref 80.0–100.0)
PLATELETS: 248 10*3/uL (ref 150–440)
RBC: 3.59 MIL/uL — ABNORMAL LOW (ref 4.40–5.90)
RDW: 15.7 % — AB (ref 11.5–14.5)
WBC: 8 10*3/uL (ref 3.8–10.6)

## 2015-02-04 LAB — PROTIME-INR
INR: 0.99
Prothrombin Time: 13.3 seconds (ref 11.4–15.0)

## 2015-02-04 LAB — BLOOD GAS, ARTERIAL
ACID-BASE EXCESS: 4.6 mmol/L — AB (ref 0.0–3.0)
ALLENS TEST (PASS/FAIL): POSITIVE — AB
Bicarbonate: 28.3 mEq/L — ABNORMAL HIGH (ref 21.0–28.0)
FIO2: 0.21
O2 Saturation: 97.9 %
PCO2 ART: 38 mmHg (ref 32.0–48.0)
PH ART: 7.48 — AB (ref 7.350–7.450)
Patient temperature: 37
pO2, Arterial: 95 mmHg (ref 83.0–108.0)

## 2015-02-04 LAB — APTT: APTT: 25 s (ref 24–36)

## 2015-02-04 SURGERY — INSERTION, TUNNELED CENTRAL VENOUS DEVICE, WITH PORT
Anesthesia: General | Laterality: Right

## 2015-02-04 MED ORDER — DEXTROSE 5 % IV SOLN
1.5000 g | INTRAVENOUS | Status: AC
  Administered 2015-02-04: 1.5 g via INTRAVENOUS
  Filled 2015-02-04: qty 1.5

## 2015-02-04 MED ORDER — LIDOCAINE HCL (CARDIAC) 20 MG/ML IV SOLN
INTRAVENOUS | Status: DC | PRN
  Administered 2015-02-04: 50 mg via INTRAVENOUS

## 2015-02-04 MED ORDER — OXYCODONE HCL 5 MG PO TABS: 5.0000 mg | ORAL_TABLET | Freq: Once | ORAL | Status: AC | PRN

## 2015-02-04 MED ORDER — ONDANSETRON HCL 4 MG/2ML IJ SOLN
INTRAMUSCULAR | Status: DC | PRN
  Administered 2015-02-04: 4 mg via INTRAVENOUS

## 2015-02-04 MED ORDER — LACTATED RINGERS IV SOLN
INTRAVENOUS | Status: DC
  Administered 2015-02-04: 11:00:00 via INTRAVENOUS

## 2015-02-04 MED ORDER — OXYCODONE HCL 5 MG/5ML PO SOLN: 5.0000 mg | Freq: Four times a day (QID) | ORAL | Status: DC | PRN

## 2015-02-04 MED ORDER — MIDAZOLAM HCL 2 MG/2ML IJ SOLN
INTRAMUSCULAR | Status: DC | PRN
  Administered 2015-02-04: 2 mg via INTRAVENOUS

## 2015-02-04 MED ORDER — BUPIVACAINE HCL (PF) 0.5 % IJ SOLN
INTRAMUSCULAR | Status: DC | PRN
  Administered 2015-02-04: 7 mL

## 2015-02-04 MED ORDER — FENTANYL CITRATE (PF) 100 MCG/2ML IJ SOLN: 25.0000 ug | INTRAMUSCULAR | Status: DC | PRN

## 2015-02-04 MED ORDER — SODIUM CHLORIDE 0.9 % IV SOLN
10000.0000 ug | INTRAVENOUS | Status: DC | PRN
  Administered 2015-02-04: 100 ug via INTRAVENOUS

## 2015-02-04 MED ORDER — SODIUM CHLORIDE 0.9 % IJ SOLN
INTRAMUSCULAR | Status: DC | PRN
  Administered 2015-02-04: 10 mL via INTRAVENOUS

## 2015-02-04 MED ORDER — KETAMINE HCL 50 MG/ML IJ SOLN
INTRAMUSCULAR | Status: DC | PRN
  Administered 2015-02-04: 25 mg via INTRAVENOUS
  Administered 2015-02-04: 25 mg via INTRAMUSCULAR

## 2015-02-04 MED ORDER — IPRATROPIUM-ALBUTEROL 0.5-2.5 (3) MG/3ML IN SOLN
RESPIRATORY_TRACT | Status: AC
  Administered 2015-02-04: 3 mL via RESPIRATORY_TRACT
  Filled 2015-02-04: qty 3

## 2015-02-04 MED ORDER — EPHEDRINE SULFATE 50 MG/ML IJ SOLN
INTRAMUSCULAR | Status: DC | PRN
  Administered 2015-02-04 (×2): 10 mg via INTRAVENOUS

## 2015-02-04 MED ORDER — IPRATROPIUM-ALBUTEROL 0.5-2.5 (3) MG/3ML IN SOLN
3.0000 mL | Freq: Once | RESPIRATORY_TRACT | Status: AC
  Administered 2015-02-04: 3 mL via RESPIRATORY_TRACT

## 2015-02-04 MED ORDER — IPRATROPIUM-ALBUTEROL 0.5-2.5 (3) MG/3ML IN SOLN: 3.0000 mL | Freq: Once | RESPIRATORY_TRACT | Status: DC | PRN

## 2015-02-04 MED ORDER — SUCCINYLCHOLINE CHLORIDE 20 MG/ML IJ SOLN
INTRAMUSCULAR | Status: DC | PRN
  Administered 2015-02-04: 100 mg via INTRAVENOUS

## 2015-02-04 MED ORDER — PHENYLEPHRINE HCL 10 MG/ML IJ SOLN
INTRAMUSCULAR | Status: DC | PRN
  Administered 2015-02-04 (×3): 50 ug via INTRAVENOUS
  Administered 2015-02-04 (×3): 100 ug via INTRAVENOUS

## 2015-02-04 MED ORDER — OXYCODONE HCL 5 MG/5ML PO SOLN
5.0000 mg | Freq: Once | ORAL | Status: AC | PRN
  Administered 2015-02-04: 5 mg via ORAL

## 2015-02-04 MED ORDER — PROPOFOL 10 MG/ML IV BOLUS
INTRAVENOUS | Status: DC | PRN
  Administered 2015-02-04: 180 mg via INTRAVENOUS
  Administered 2015-02-04: 20 mg via INTRAVENOUS

## 2015-02-04 MED ORDER — FENTANYL CITRATE (PF) 100 MCG/2ML IJ SOLN
INTRAMUSCULAR | Status: DC | PRN
  Administered 2015-02-04 (×4): 50 ug via INTRAVENOUS

## 2015-02-04 MED ORDER — LIDOCAINE HCL (PF) 1 % IJ SOLN
INTRAMUSCULAR | Status: AC
  Filled 2015-02-04: qty 30

## 2015-02-04 MED ORDER — HEPARIN SODIUM (PORCINE) 5000 UNIT/ML IJ SOLN
INTRAMUSCULAR | Status: AC
  Filled 2015-02-04: qty 1

## 2015-02-04 MED ORDER — OXYCODONE HCL 5 MG/5ML PO SOLN
ORAL | Status: AC
  Administered 2015-02-04: 5 mg via ORAL
  Filled 2015-02-04: qty 5

## 2015-02-04 SURGICAL SUPPLY — 39 items
BAG DECANTER FOR FLEXI CONT (MISCELLANEOUS) ×3 IMPLANT
BLADE SURG SZ11 CARB STEEL (BLADE) ×3 IMPLANT
CANISTER SUCT 1200ML W/VALVE (MISCELLANEOUS) ×3 IMPLANT
CHLORAPREP W/TINT 26ML (MISCELLANEOUS) ×3 IMPLANT
COVER LIGHT HANDLE STERIS (MISCELLANEOUS) ×12 IMPLANT
DRAPE C-ARM XRAY 36X54 (DRAPES) ×3 IMPLANT
DRESSING TELFA 4X3 1S ST N-ADH (GAUZE/BANDAGES/DRESSINGS) ×3 IMPLANT
DRSG TEGADERM 2-3/8X2-3/4 SM (GAUZE/BANDAGES/DRESSINGS) ×3 IMPLANT
DRSG TEGADERM 4X4.75 (GAUZE/BANDAGES/DRESSINGS) ×3 IMPLANT
DRSG TELFA 3X8 NADH (GAUZE/BANDAGES/DRESSINGS) ×3 IMPLANT
ELECT CAUTERY BLADE 6.4 (BLADE) ×6 IMPLANT
ELECT CAUTERY BLADE TIP 2.5 (TIP) ×6
ELECTRODE CAUTERY BLDE TIP 2.5 (TIP) ×4 IMPLANT
GLOVE EXAM LX STRL 7.5 (GLOVE) ×3 IMPLANT
GOWN STRL REUS W/ TWL LRG LVL3 (GOWN DISPOSABLE) ×4 IMPLANT
GOWN STRL REUS W/TWL LRG LVL3 (GOWN DISPOSABLE) ×2
IV NS 500ML (IV SOLUTION) ×1
IV NS 500ML BAXH (IV SOLUTION) ×2 IMPLANT
KIT PORT POWER 8FR ISP CVUE (Catheter) ×3 IMPLANT
KIT RM TURNOVER STRD PROC AR (KITS) ×3 IMPLANT
LABEL OR SOLS (LABEL) ×3 IMPLANT
MARKER SKIN W/RULER 31145785 (MISCELLANEOUS) ×3 IMPLANT
NDL SAFETY 22GX1.5 (NEEDLE) ×3 IMPLANT
NEEDLE FILTER BLUNT 18X 1/2SAF (NEEDLE) ×1
NEEDLE FILTER BLUNT 18X1 1/2 (NEEDLE) ×2 IMPLANT
NS IRRIG 500ML POUR BTL (IV SOLUTION) ×3 IMPLANT
PACK PORT-A-CATH (MISCELLANEOUS) ×3 IMPLANT
PAD GROUND ADULT SPLIT (MISCELLANEOUS) ×3 IMPLANT
PORTACATH POWER 8F (Port) IMPLANT
SUCTION FRAZIER TIP 10 FR DISP (SUCTIONS) ×3 IMPLANT
SUT ETHILON 4-0 (SUTURE) ×3
SUT ETHILON 4-0 FS2 18XMFL BLK (SUTURE) ×6
SUT PROLENE 2 0 SH DA (SUTURE) ×6 IMPLANT
SUT PROLENE 3 0 SH DA (SUTURE) ×6 IMPLANT
SUT VIC AB 3-0 SH 27 (SUTURE) ×2
SUT VIC AB 3-0 SH 27X BRD (SUTURE) ×4 IMPLANT
SUTURE ETHLN 4-0 FS2 18XMF BLK (SUTURE) ×6 IMPLANT
SYR 3ML LL SCALE MARK (SYRINGE) ×3 IMPLANT
SYRINGE 10CC LL (SYRINGE) ×3 IMPLANT

## 2015-02-04 NOTE — Discharge Instructions (Signed)

## 2015-02-04 NOTE — Op Note (Signed)
  02/04/2015  2:14 PM  PATIENT:  Jeffrey [name]  64 y.o. male  PRE-OPERATIVE DIAGNOSIS:  Squamous cell carcinoma the esophagus with metastases to the left neck  POST-OPERATIVE DIAGNOSIS:  Same  PROCEDURE:  #1 incisional biopsy of left neck mass (frozen section consistent with metastatic squamous cell carcinoma) #2 ultrasound-guided right internal jugular vein Port-A-Cath  SURGEON:  Surgeon(s) and Role:    * [name], MD - Primary    * Florene Glen, MD - Assisting  ASSISTANTS: Dr. Phoebe Perch  ANESTHESIA: Gen.  INDICATIONS FOR PROCEDURE this 64 year old gentleman has a history of squamous cell carcinoma the esophagus and a PET positive left neck mass. The left neck mass has not been biopsied and it was felt that it was necessary to make a definitive diagnosis of metastatic carcinoma before he underwent chemotherapy. In addition he required Port-A-Cath placement for his esophageal carcinoma treatment.  DICTATION: The patient brought to the operating suite and placed in supine position. General endotracheal anesthesia was given with a single-lumen tube. The left neck mass was palpable medially underneath the external jugular vein. The patient was prepped and draped in usual sterile fashion. A incision was made just anterior to the external jugular vein. Initially I felt that the mass would be immediately underneath the skin however it was much deeper and was underneath the clavicular head of the sternocleidomastoid. It was in the location of the carotid sheath and I asked Dr. Phoebe Perch to assist in getting around the mass. I dissected the mass out in its anterior aspect and then placed a small suture through the mass and elevated into the wound. Multiple biopsies were taken and sent for frozen section. Hemostasis was complete. Frozen section returned metastatic carcinoma. The sternocleidomastoid was closed with interrupted 2-0 Vicryl. The platysma was closed with 3-0 Vicryl and the  skin was closed with a running 4-0 nylon.  At this point we then turned our attention to the right side of the patient's neck. An ultrasound was used to identify the right internal jugular vein and it was suitable for cannulation. Patient was then prepped and draped in usual sterile fashion. We began by cannulating the right internal jugular vein and under fluoroscopic guidance placed a guidewire into the right side of the heart. A port site was then created on the anterior aspect of the chest wall. The patient is extremely cachectic and there was little subcutaneous tissue present above the pectoralis muscle. However I felt there was sufficient to cover the Port-A-Cath. Therefore once the port site was created and hemostasis was complete we tunneled the catheter from the port site up to the neck and then placed a peel-away sheath over-the-wire. Catheters then inserted and positioned under fluoroscopic guidance to be at the cavoatrial junction. The catheter flushed and irrigated nicely. The catheter was then assembled appropriately and it was again flushed with a Huber needle. The catheter was secured to the pectoralis fascial with 3 corner sutures of 3-0 Prolene. The wounds were then closed with 3-0 Vicryl and the subtest tissues and 4-0 Vicryl and the skin. Sterile dressings were applied.  The patient was awakened from an endotracheal anesthesia and taken to the recovery room in stable condition.   [name], MD

## 2015-02-04 NOTE — Anesthesia Postprocedure Evaluation (Signed)
  Anesthesia Post-op Note  Patient: Jeffrey [name]  Procedure(s) Performed: Procedure(s): INSERTION PORT-A-CATH (Right) LYMPH NODE BIOPSY (N/A)  Anesthesia type:General ETT  Patient location: PACU  Post pain: Pain level controlled  Post assessment: Post-op Vital signs reviewed, Patient's Cardiovascular Status Stable, Respiratory Function Stable, Patent Airway and No signs of Nausea or vomiting  Post vital signs: Reviewed and stable  Last Vitals:  Filed Vitals:   02/04/15 1447  BP: 111/72  Pulse: 69  Temp: 37.7 C  Resp: 14    Level of consciousness: awake, alert  and patient cooperative  Complications: No apparent anesthesia complications

## 2015-02-04 NOTE — OR Nursing (Signed)
Patient arrived from PACU noted to have swelling of the left neck the size of golf ball cut in half. Dr. Amie Critchley came to check out and felt we should notify Dr. Genevive Bi. Dr. Faith Rogue paged and reports he will come and evaluate. Pt . Denies any problems with breathing.

## 2015-02-04 NOTE — OR Nursing (Signed)
No further increase in swelling of the left neck. Dressings without increase in drainage. Pt. Reports some continued discomfort right chest incisional area though decreased and reports no pain left neck except with movement. Pt. And daughter report being comfortable for going home.

## 2015-02-04 NOTE — Transfer of Care (Signed)
Immediate Anesthesia Transfer of Care Note  Patient: Jeffrey [name]  Procedure(s) Performed: Procedure(s): INSERTION PORT-A-CATH (Right) LYMPH NODE BIOPSY (N/A)  Patient Location: PACU  Anesthesia Type:General  Level of Consciousness: awake and alert   Airway & Oxygen Therapy: Patient Spontanous Breathing and Patient connected to face mask oxygen  Post-op Assessment: Report given to RN  Post vital signs: Reviewed  Last Vitals:  Filed Vitals:   02/04/15 1417  BP: 123/80  Pulse: 81  Temp: 38 C  Resp: 13    Complications: No apparent anesthesia complications

## 2015-02-04 NOTE — Anesthesia Procedure Notes (Signed)
Procedure Name: Intubation Performed by: Rolla Plate Pre-anesthesia Checklist: Patient identified, Patient being monitored, Timeout performed, Emergency Drugs available and Suction available Patient Re-evaluated:Patient Re-evaluated prior to inductionOxygen Delivery Method: Circle system utilized Preoxygenation: Pre-oxygenation with 100% oxygen Intubation Type: IV induction Ventilation: Mask ventilation without difficulty Laryngoscope Size: Miller and 2 Grade View: Grade I Tube type: Oral Tube size: 7.5 mm Number of attempts: 1 Airway Equipment and Method: Stylet Placement Confirmation: ETT inserted through vocal cords under direct vision,  positive ETCO2 and breath sounds checked- equal and bilateral Secured at: 22 cm Tube secured with: Tape Dental Injury: Teeth and Oropharynx as per pre-operative assessment

## 2015-02-04 NOTE — OR Nursing (Signed)
Dr. Genevive Bi came in about 45 min. Ago and evaluated patient's left neck. He noted some swelling but reports it should be okay. He instructed patient and his daughter to continue to observe for increased swelling and to return to hospital if needed or to call 911 if any problems with breathing. They report good understanding.

## 2015-02-04 NOTE — Telephone Encounter (Signed)
Spoke with Dr Aquilla Hacker office. They are currently sending new orders to [facility] for home health.  Oncology Nurse Navigator Documentation    Navigator Encounter Type: Telephone (02/04/15 1500)                      Time Spent with Patient: 15 (02/04/15 1500)

## 2015-02-04 NOTE — Anesthesia Preprocedure Evaluation (Addendum)
Anesthesia Evaluation  Patient identified by MRN, date of birth, ID band Patient awake    Reviewed: Allergy & Precautions, H&P , NPO status , Patient's Chart, lab work & pertinent test results  History of Anesthesia Complications Negative for: history of anesthetic complications  Airway Mallampati: II  TM Distance: >3 FB Neck ROM: limited    Dental  (+) Poor Dentition, Chipped, Missing   Pulmonary neg shortness of breath, asthma , COPD, Current Smoker,    Pulmonary exam normal breath sounds clear to auscultation       Cardiovascular Exercise Tolerance: Poor hypertension, (-) angina(-) Past MI and (-) DOE Normal cardiovascular exam Rhythm:regular Rate:Normal     Neuro/Psych negative neurological ROS  negative psych ROS   GI/Hepatic negative GI ROS, Neg liver ROS,   Endo/Other  negative endocrine ROS  Renal/GU CRFRenal disease  negative genitourinary   Musculoskeletal   Abdominal   Peds  Hematology negative hematology ROS (+)   Anesthesia Other Findings Past Medical History:   Hypertension                                                 Bladder cancer ([facility])                                         Emphysema of lung ([facility])                                      Asthma                                                       Lung cancer ([place])                                            Chronic kidney disease                                      Past Surgical History:   BLADDER REMOVAL                                               ESOPHAGOGASTRODUODENOSCOPY (EGD) WITH PROPOFOL  N/A 01/21/2015     Comment:Procedure: ESOPHAGOGASTRODUODENOSCOPY (EGD)               WITH PROPOFOL-looking in the esophagus stomach               and upper small intestine to evaluate and               treat;  Surgeon: [name], MD;                Location: [facility] END[facility]COPY;  Service: Endoscopy;  Laterality: N/A;   GASTROSTOMY  W/ FEEDING TUBE                     Left                Comment:placed end of october 2016  BMI    Body Mass Index   20.04 kg/m 2      Reproductive/Obstetrics negative OB ROS                             Anesthesia Physical Anesthesia Plan  ASA: III  Anesthesia Plan: General ETT   Post-op Pain Management:    Induction:   Airway Management Planned:   Additional Equipment:   Intra-op Plan:   Post-operative Plan:   Informed Consent: I have reviewed the patients History and Physical, chart, labs and discussed the procedure including the risks, benefits and alternatives for the proposed anesthesia with the patient or authorized representative who has indicated his/her understanding and acceptance.   Dental Advisory Given  Plan Discussed with: Anesthesiologist, CRNA and Surgeon  Anesthesia Plan Comments:         Anesthesia Quick Evaluation

## 2015-02-04 NOTE — H&P (Signed)
I have reviewed the history and physical and there are no changes.  The patient understands the proposed procedure and all questions answered.  Zeven Kocak, MD  

## 2015-02-05 ENCOUNTER — Ambulatory Visit: Payer: Self-pay

## 2015-02-05 ENCOUNTER — Inpatient Hospital Stay: Payer: Self-pay | Attending: Cardiothoracic Surgery | Admitting: Cardiothoracic Surgery

## 2015-02-05 ENCOUNTER — Other Ambulatory Visit: Payer: Self-pay

## 2015-02-05 ENCOUNTER — Other Ambulatory Visit (HOSPITAL_BASED_OUTPATIENT_CLINIC_OR_DEPARTMENT_OTHER): Payer: Self-pay | Admitting: *Deleted

## 2015-02-05 ENCOUNTER — Encounter: Payer: Self-pay | Admitting: Cardiothoracic Surgery

## 2015-02-05 ENCOUNTER — Ambulatory Visit
Admit: 2015-02-05 | Discharge: 2015-02-05 | Disposition: A | Discharge status: Home / Self Care | Payer: Self-pay | Attending: Radiation Oncology | Admitting: Radiation Oncology

## 2015-02-05 VITALS — BP 107/66 | HR 73 | Temp 98.1°F | Resp 20

## 2015-02-05 DIAGNOSIS — C155 Malignant neoplasm of lower third of esophagus: Secondary | ICD-10-CM | POA: Insufficient documentation

## 2015-02-05 DIAGNOSIS — C154 Malignant neoplasm of middle third of esophagus: Secondary | ICD-10-CM

## 2015-02-05 DIAGNOSIS — C779 Secondary and unspecified malignant neoplasm of lymph node, unspecified: Secondary | ICD-10-CM | POA: Insufficient documentation

## 2015-02-05 DIAGNOSIS — Z5111 Encounter for antineoplastic chemotherapy: Secondary | ICD-10-CM | POA: Insufficient documentation

## 2015-02-05 DIAGNOSIS — Z79899 Other long term (current) drug therapy: Secondary | ICD-10-CM | POA: Insufficient documentation

## 2015-02-05 DIAGNOSIS — R591 Generalized enlarged lymph nodes: Secondary | ICD-10-CM | POA: Insufficient documentation

## 2015-02-05 DIAGNOSIS — J439 Emphysema, unspecified: Secondary | ICD-10-CM | POA: Insufficient documentation

## 2015-02-05 DIAGNOSIS — Z931 Gastrostomy status: Secondary | ICD-10-CM | POA: Insufficient documentation

## 2015-02-05 DIAGNOSIS — F17200 Nicotine dependence, unspecified, uncomplicated: Secondary | ICD-10-CM | POA: Insufficient documentation

## 2015-02-05 DIAGNOSIS — Z8551 Personal history of malignant neoplasm of bladder: Secondary | ICD-10-CM | POA: Insufficient documentation

## 2015-02-05 DIAGNOSIS — I129 Hypertensive chronic kidney disease with stage 1 through stage 4 chronic kidney disease, or unspecified chronic kidney disease: Secondary | ICD-10-CM | POA: Insufficient documentation

## 2015-02-05 DIAGNOSIS — E46 Unspecified protein-calorie malnutrition: Secondary | ICD-10-CM | POA: Insufficient documentation

## 2015-02-05 DIAGNOSIS — N183 Chronic kidney disease, stage 3 (moderate): Secondary | ICD-10-CM | POA: Insufficient documentation

## 2015-02-05 DIAGNOSIS — D649 Anemia, unspecified: Secondary | ICD-10-CM | POA: Insufficient documentation

## 2015-02-05 LAB — SURGICAL PATHOLOGY

## 2015-02-05 NOTE — Progress Notes (Signed)
[facility] Post-Op Note  Patient ID: Jeffrey [name], male   DOB: 08-Mar-1951, 64 y.o.   MRN: [id number]  HISTORY: He returns today in follow-up. He had a biopsy of the left scalene lymph node biopsy yesterday as well as a Port-A-Cath. States he did well overnight. He doesn't have a lot of pain and has noticed no swelling. He has no problems with his voice or breathing.   Filed Vitals:   02/05/15 1014  BP: 107/66  Pulse: 73  Temp: 98.1 F (36.7 C)  Resp: 20     EXAM:  He is awake alert and oriented. He's pleasant. He is in no distress. The wounds were all redressed. The skin is well approximated. There is only some mild swelling on the left side. The Port-A-Cath insertion site looks good. All the dressings were replaced.   ASSESSMENT: Status post Port-A-Cath and left lymph node biopsy   PLAN:   He will come back next week to have his sutures removed and his wound Steri-Stripped. I told him he could remove his dressings in 24 hours and then wash over the incisions. I do not see any untoward complications.    [name], MD

## 2015-02-05 NOTE — Progress Notes (Signed)
Order obtained from Dr. Genevive Bi to remove sutures on 02/11/15 when patient comes to clinic for chemotherapy tx.

## 2015-02-05 NOTE — Patient Instructions (Signed)

## 2015-02-05 NOTE — Progress Notes (Signed)
  Oncology Nurse Navigator Documentation    Navigator Encounter Type: Other (02/05/15 1000) Patient Visit Type: Radonc (02/05/15 1000) Treatment Phase: CT SIM (02/05/15 1000) Barriers/Navigation Needs: No barriers at this time (02/05/15 1000)                Time Spent with Patient: 15 (02/05/15 1000)   Voices no needs at present. Aware of chemo class next Tuesday 11/8

## 2015-02-09 ENCOUNTER — Telehealth: Payer: Self-pay

## 2015-02-09 NOTE — Telephone Encounter (Signed)
  Oncology Nurse Navigator Documentation    Navigator Encounter Type: Telephone (02/09/15 1200)                      Time Spent with Patient: 15 (02/09/15 1200)   Left voicemail for appt reminder. Chemo education class 11/8 0900.

## 2015-02-10 ENCOUNTER — Other Ambulatory Visit: Payer: Self-pay | Admitting: *Deleted

## 2015-02-10 ENCOUNTER — Inpatient Hospital Stay: Payer: Self-pay

## 2015-02-10 ENCOUNTER — Telehealth: Payer: Self-pay

## 2015-02-10 ENCOUNTER — Telehealth: Payer: Self-pay | Admitting: *Deleted

## 2015-02-10 MED ORDER — LIDOCAINE-PRILOCAINE 2.5-2.5 % EX CREA: TOPICAL_CREAM | CUTANEOUS | Status: DC

## 2015-02-10 NOTE — Telephone Encounter (Signed)
  Oncology Nurse Navigator Documentation    Navigator Encounter Type: Telephone (02/10/15 1200)                      Time Spent with Patient: 15 (02/10/15 1200)   Received call from Jeffrey [name]. Needs EMLA cream for his chemo start 11/9. Notified Heather J via inbox

## 2015-02-10 NOTE — Telephone Encounter (Signed)
Got a call from [facility] the chemotherapy nurse in classroom teaching pt. Wants me to call in emla cream for pt who has port. It was sent in electronically

## 2015-02-11 ENCOUNTER — Inpatient Hospital Stay: Payer: Self-pay

## 2015-02-11 ENCOUNTER — Inpatient Hospital Stay (HOSPITAL_BASED_OUTPATIENT_CLINIC_OR_DEPARTMENT_OTHER): Payer: Self-pay | Admitting: Internal Medicine

## 2015-02-11 ENCOUNTER — Encounter: Payer: Self-pay | Admitting: Internal Medicine

## 2015-02-11 VITALS — BP 112/75 | HR 73 | Temp 97.3°F | Resp 18 | Ht 67.0 in | Wt 125.8 lb

## 2015-02-11 VITALS — BP 106/67 | HR 65

## 2015-02-11 DIAGNOSIS — Z8551 Personal history of malignant neoplasm of bladder: Secondary | ICD-10-CM

## 2015-02-11 DIAGNOSIS — C779 Secondary and unspecified malignant neoplasm of lymph node, unspecified: Secondary | ICD-10-CM

## 2015-02-11 DIAGNOSIS — R591 Generalized enlarged lymph nodes: Secondary | ICD-10-CM

## 2015-02-11 DIAGNOSIS — C155 Malignant neoplasm of lower third of esophagus: Secondary | ICD-10-CM

## 2015-02-11 DIAGNOSIS — N183 Chronic kidney disease, stage 3 (moderate): Secondary | ICD-10-CM

## 2015-02-11 DIAGNOSIS — F17200 Nicotine dependence, unspecified, uncomplicated: Secondary | ICD-10-CM

## 2015-02-11 DIAGNOSIS — Z931 Gastrostomy status: Secondary | ICD-10-CM

## 2015-02-11 DIAGNOSIS — C159 Malignant neoplasm of esophagus, unspecified: Secondary | ICD-10-CM

## 2015-02-11 DIAGNOSIS — I129 Hypertensive chronic kidney disease with stage 1 through stage 4 chronic kidney disease, or unspecified chronic kidney disease: Secondary | ICD-10-CM

## 2015-02-11 DIAGNOSIS — Z79899 Other long term (current) drug therapy: Secondary | ICD-10-CM

## 2015-02-11 DIAGNOSIS — C154 Malignant neoplasm of middle third of esophagus: Secondary | ICD-10-CM

## 2015-02-11 DIAGNOSIS — E46 Unspecified protein-calorie malnutrition: Secondary | ICD-10-CM

## 2015-02-11 DIAGNOSIS — J439 Emphysema, unspecified: Secondary | ICD-10-CM

## 2015-02-11 DIAGNOSIS — D649 Anemia, unspecified: Secondary | ICD-10-CM

## 2015-02-11 LAB — COMPREHENSIVE METABOLIC PANEL
ALT: 9 U/L — AB (ref 17–63)
AST: 19 U/L (ref 15–41)
Albumin: 3.6 g/dL (ref 3.5–5.0)
Alkaline Phosphatase: 57 U/L (ref 38–126)
Anion gap: 8 (ref 5–15)
BILIRUBIN TOTAL: 0.4 mg/dL (ref 0.3–1.2)
BUN: 44 mg/dL — AB (ref 6–20)
CHLORIDE: 99 mmol/L — AB (ref 101–111)
CO2: 27 mmol/L (ref 22–32)
CREATININE: 2.09 mg/dL — AB (ref 0.61–1.24)
Calcium: 8.7 mg/dL — ABNORMAL LOW (ref 8.9–10.3)
GFR calc Af Amer: 37 mL/min — ABNORMAL LOW (ref >60)
GFR, EST NON AFRICAN AMERICAN: 32 mL/min — AB (ref >60)
GLUCOSE: 99 mg/dL (ref 65–99)
Potassium: 4.6 mmol/L (ref 3.5–5.1)
Sodium: 134 mmol/L — ABNORMAL LOW (ref 135–145)
Total Protein: 7.4 g/dL (ref 6.5–8.1)

## 2015-02-11 LAB — CBC WITH DIFFERENTIAL/PLATELET
BASOS ABS: 0.1 10*3/uL (ref 0–0.1)
Basophils Relative: 1 %
Eosinophils Absolute: 0.3 10*3/uL (ref 0–0.7)
Eosinophils Relative: 3 %
HEMATOCRIT: 28.7 % — AB (ref 40.0–52.0)
Hemoglobin: 9.4 g/dL — ABNORMAL LOW (ref 13.0–18.0)
LYMPHS PCT: 18 %
Lymphs Abs: 1.5 10*3/uL (ref 1.0–3.6)
MCH: 27.7 pg (ref 26.0–34.0)
MCHC: 33 g/dL (ref 32.0–36.0)
MCV: 84 fL (ref 80.0–100.0)
Monocytes Absolute: 0.8 10*3/uL (ref 0.2–1.0)
Monocytes Relative: 10 %
NEUTROS ABS: 5.5 10*3/uL (ref 1.4–6.5)
NEUTROS PCT: 68 %
Platelets: 185 10*3/uL (ref 150–440)
RBC: 3.41 MIL/uL — AB (ref 4.40–5.90)
RDW: 15.2 % — ABNORMAL HIGH (ref 11.5–14.5)
WBC: 8.2 10*3/uL (ref 3.8–10.6)

## 2015-02-11 MED ORDER — OXYCODONE HCL 5 MG/5ML PO SOLN: 5.0000 mg | Freq: Four times a day (QID) | ORAL | Status: DC | PRN

## 2015-02-11 MED ORDER — HEPARIN SOD (PORK) LOCK FLUSH 100 UNIT/ML IV SOLN
500.0000 [IU] | Freq: Once | INTRAVENOUS | Status: AC | PRN
  Administered 2015-02-11: 500 [IU]
  Filled 2015-02-11: qty 5

## 2015-02-11 MED ORDER — SODIUM CHLORIDE 0.9 % IV SOLN
110.2000 mg | Freq: Once | INTRAVENOUS | Status: AC
  Administered 2015-02-11: 110 mg via INTRAVENOUS
  Filled 2015-02-11: qty 11

## 2015-02-11 MED ORDER — PACLITAXEL CHEMO INJECTION 300 MG/50ML
45.0000 mg/m2 | Freq: Once | INTRAVENOUS | Status: AC
  Administered 2015-02-11: 72 mg via INTRAVENOUS
  Filled 2015-02-11: qty 12

## 2015-02-11 MED ORDER — FAMOTIDINE IN NACL 20-0.9 MG/50ML-% IV SOLN
20.0000 mg | Freq: Once | INTRAVENOUS | Status: AC
  Administered 2015-02-11: 20 mg via INTRAVENOUS
  Filled 2015-02-11: qty 50

## 2015-02-11 MED ORDER — SODIUM CHLORIDE 0.9 % IJ SOLN
10.0000 mL | Freq: Once | INTRAMUSCULAR | Status: AC
  Administered 2015-02-11: 10 mL via INTRAVENOUS
  Filled 2015-02-11: qty 10

## 2015-02-11 MED ORDER — SODIUM CHLORIDE 0.9 % IV SOLN
Freq: Once | INTRAVENOUS | Status: AC
  Administered 2015-02-11: 10:00:00 via INTRAVENOUS
  Filled 2015-02-11: qty 1000

## 2015-02-11 MED ORDER — DIPHENHYDRAMINE HCL 50 MG/ML IJ SOLN
50.0000 mg | Freq: Once | INTRAMUSCULAR | Status: AC
  Administered 2015-02-11: 50 mg via INTRAVENOUS
  Filled 2015-02-11: qty 1

## 2015-02-11 MED ORDER — ONDANSETRON HCL 4 MG/5ML PO SOLN: 4.0000 mg | Freq: Three times a day (TID) | ORAL | Status: DC | PRN

## 2015-02-11 MED ORDER — SODIUM CHLORIDE 0.9 % IV SOLN
Freq: Once | INTRAVENOUS | Status: AC
  Administered 2015-02-11: 10:00:00 via INTRAVENOUS
  Filled 2015-02-11: qty 8

## 2015-02-11 NOTE — Progress Notes (Signed)
Sutures removed from port site and neck. Incision clean dry and intact. Steristrips applied. Pt tolerated well.

## 2015-02-11 NOTE — Progress Notes (Signed)
.[place] NOTE  Patient Care Team: Adin Hector, MD as PCP - General (Internal Medicine) Clent Jacks, RN as Registered Nurse  CHIEF COMPLAINTS/PURPOSE OF CONSULTATION:   # OCT 2016- SQUAMOUS CELL CA of middle esophagus; Stage IV [EGD bx]; PET- Esopahgeal uptake & 2 CM Left Cervical LN-MET SQUAM; Carbo-taxol [Nov 9th]with RT [Nov 14th]  # malnutrition s/p PEG tube  # CKD [stage III]; Hx of non-invasive bladder ca [s/p cystectomy with neo-bladder; [facility]]  HISTORY OF PRESENTING ILLNESS:  Jeffrey [name] 64 y.o. 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  In the interim patient underwent left cervical lymph node biopsy /Mediport placement in anticipation of chemotherapy.   Patient currently is using the PEG tube.  He is using Jevity approximately 5 cans a day. He is able to swallow water/Gatorade. No nausea no vomiting. No blood in stools or black stools.  He complains of pain while swallowing/PEG tube site-on oxycodone liquid.   ROS: A complete 10 point review of system is done which is negative except mentioned above in history of present illness  MEDICAL HISTORY:  Past Medical History  Diagnosis Date  . Hypertension   . Bladder cancer ([place])   . Emphysema of lung ([place])   . Lung cancer ([place])   . Chronic kidney disease     SURGICAL HISTORY: Past Surgical History  Procedure Laterality Date  . Bladder removal    . Esophagogastroduodenoscopy (egd) with propofol N/A 01/21/2015    Procedure: ESOPHAGOGASTRODUODENOSCOPY (EGD) WITH PROPOFOL-looking in the esophagus stomach and upper small intestine to evaluate and treat;  Surgeon: [name], MD;  Location: [facility] ENDOSCOPY;  Service: Endoscopy;  Laterality: N/A;  . Gastrostomy w/ feeding tube Left     placed end of october 2016   . Portacath placement Right 02/04/2015    Procedure: INSERTION PORT-A-CATH;  Surgeon: [name], MD;  Location: [facility] ORS;  Service: General;  Laterality: Right;  . Lymph node biopsy N/A 02/04/2015    Procedure: LYMPH NODE BIOPSY;  Surgeon: [name], MD;  Location: [facility] ORS;  Service: General;  Laterality: N/A;    SOCIAL HISTORY: Social History   Social History  . Marital Status: Married    Spouse Name: N/A  . Number of Children: N/A  . Years of Education: N/A   Occupational History  . Not on file.   Social History Main Topics  . Smoking status: Current Every Day Smoker -- 0.50 packs/day    Types: Cigarettes  . Smokeless tobacco: Current User    Types: Chew  . Alcohol Use: No  . Drug Use: No  . Sexual Activity: Not on file   Other Topics Concern  . Not on file   Social History Narrative    FAMILY HISTORY: Family History  Problem Relation Age of Onset  . Hypertension Other     ALLERGIES:  is allergic to aspirin.  MEDICATIONS:  Current Outpatient Prescriptions  Medication Sig Dispense Refill  . lidocaine-prilocaine (EMLA) cream Apply cream 1 hour before chemotherapy treatment, place a small amount of saran wrap over the cream to protect your clothing 30 g 1  . neomycin-bacitracin-polymyxin (NEOSPORIN) OINT Apply 1 application topically daily. 30 g 1  . oxyCODONE (ROXICODONE) 5 MG/5ML solution Place 5 mLs (5 mg total) into feeding tube every 6 (six) hours as needed for severe pain. 100 mL 0  .  Nutritional Supplements (FEEDING SUPPLEMENT, JEVITY 1.5 CAL/FIBER,) LIQD Place 237 mLs into feeding tube 6 (six) times daily. 1000 mL 5  . pantoprazole sodium (PROTONIX) 40 mg/20 mL PACK Place 20 mLs (40 mg total) into feeding tube 2 (two) times daily. 30 each 3  . senna (SENOKOT) 8.6 MG TABS tablet Take by mouth.     No current facility-administered medications for this visit.      Marland Kitchen  PHYSICAL EXAMINATION: ECOG PERFORMANCE STATUS: 1 - Symptomatic but completely  ambulatory  Filed Vitals:   02/11/15 0910  BP: 112/75  Pulse: 73  Temp: 97.3 F (36.3 C)  Resp: 18   Filed Weights   02/11/15 0910  Weight: 125 lb 12.4 oz (57.05 kg)    GENERAL: Thin built; moderately nourished. Alert, no distress and comfortable.   Accompanied by his daughter. EYES: no pallor or icterus OROPHARYNX: no thrush or ulceration; good dentition  NECK: supple, no masses felt LYMPH:  ~2-3 CM palpable lymphadenopathy in the cervical on left side [? Level II] LUNGS: clear to auscultation and  No wheeze or crackles HEART/CVS: regular rate & rhythm and no murmurs; No lower extremity edema ABDOMEN: abdomen soft, non-tender and normal bowel sounds; PEG tube in place Musculoskeletal:no cyanosis of digits and no clubbing  PSYCH: alert & oriented x 3 with fluent speech NEURO: no focal motor/sensory deficits SKIN:  no rashes or significant lesions  LABORATORY DATA:  I have reviewed the data as listed Lab Results  Component Value Date   WBC 8.2 02/11/2015   HGB 9.4* 02/11/2015   HCT 28.7* 02/11/2015   MCV 84.0 02/11/2015   PLT 185 02/11/2015    Recent Labs  01/24/15 0529 02/04/15 1037 02/11/15 0852  NA 142 137 134*  K 4.2 4.6 4.6  CL 113* 100* 99*  CO2 _0 GLUCOSE 112* 94 99  BUN 18 39* 44*  CREATININE 1.88* 2.14* 2.09*  CALCIUM 8.7* 8.7* 8.7*  GFRNONAA 37* 31* 32*  GFRAA 43* 36* 37*  PROT 6.3* 7.8 7.4  ALBUMIN 3.0* 4.1 3.6  AST 12* 21 19  ALT 6* 9* 9*  ALKPHOS 45 62 57  BILITOT 0.5 0.9 0.4    RADIOGRAPHIC STUDIES: I have personally reviewed the radiological images as listed and agreed with the findings in the report. Dg Chest 2 View  01/21/2015  CLINICAL DATA:  Dysphagia. Follow-up contrast from barium swallow performed yesterday. Recent surgery for bladder cancer. EXAM: CHEST  2 VIEW COMPARISON:  01/20/2015 barium swallow FINDINGS: There is residual barium in the thoracic esophagus proximal to an irregular esophageal stricture as described on  yesterday's barium swallow. Cardiac silhouette is within normal limits. The lungs are hyperinflated without evidence of airspace consolidation, edema, pleural effusion, or pneumothorax. Free intraperitoneal air is again seen under the hemidiaphragms, similar to the prior study and likely related to recent surgery. Residual oral contrast is partially visualized in the splenic flexure of the colon. No acute osseous abnormality is seen. IMPRESSION: 1. Residual barium in the proximal to mid thoracic esophagus. 2. Clear lungs. 3. Unchanged intraperitoneal free air. Electronically Signed   By: Logan Bores M.D.   On: 01/21/2015 08:18   Ir Gastrostomy Tube Mod Sed  01/22/2015  CLINICAL DATA:  64 year old male with near obstructing mid esophageal carcinoma. EXAM: PERC PLACEMENT GASTROSTOMY Date: 01/22/2015 PROCEDURE: 1. Fluoroscopically guided placement of percutaneous balloon retention gastrostomy tube. Interventional Radiologist:  Criselda Peaches, MD ANESTHESIA/SEDATION: Moderate (conscious) sedation was used. 4 mg Versed, 200 mcg Fentanyl  were administered intravenously. The patient's vital signs were monitored continuously by radiology nursing throughout the procedure. Sedation Time: 48 minutes FLUOROSCOPY TIME:  5 minutes 54 seconds CONTRAST:  1 OMNIPAQUE IOHEXOL 300 MG/ML  SOLN MEDICATIONS: 2 g Ancef was administered intravenously within 1 hour of skin incision. TECHNIQUE: Informed consent was obtained from the patient following explanation of the procedure, risks, benefits and alternatives. The patient understands, agrees and consents for the procedure. All questions were addressed. A time out was performed. Maximal barrier sterile technique utilized including caps, mask, sterile gowns, sterile gloves, large sterile drape, hand hygiene, and chlorhexadine skin prep. An angled catheter was advanced over a wire under fluoroscopic guidance through the nose, down the esophagus and into the body of the stomach. The  stomach was then insufflated with several 100 ml of air. Fluoroscopy confirmed location of the gastric bubble, as well as inferior displacement of the barium stained colon. Under direct fluoroscopic guidance, 2 T-tacks were placed, and the anterior gastric wall drawn up against the anterior abdominal wall. Percutaneous access was then obtained into the mid gastric body with an 18 gauge needle. Aspiration of air, and injection of contrast material under fluoroscopy confirmed needle placement. An Amplatz wire was advanced in the gastric body and the access needle exchanged for a 16 French peel-away sheath. A 14 French can group percutaneous gastrostomy tube was then lubricated and advanced through the peel-away sheath. The peel-away sheath was removed. The retention balloon was inflated with 5 mL dilute contrast and saline solution. The balloon was pulled snug against the anterior abdominal wall and the external bumper of fixed in place. The bumper was secured with an 0 Prolene suture. The T tacks were then cut. A gentle hand injection of contrast material through the gastrostomy tube confirmed intragastric location. The tube was then flushed with saline. The patient will be observed for several hours with the newly placed tube on low wall suction to evaluate for any post procedure complication. The patient tolerated the procedure well, there is no immediate complication. IMPRESSION: Successful placement of a 4 French push in balloon retention gastrostomy tube. Tubal be ready for use tomorrow morning 01/23/2015. Electronically Signed   By: Jacqulynn Cadet M.D.   On: 01/22/2015 15:14   Dg Esophagus  01/20/2015  CLINICAL DATA:  Dysphasia. EXAM: ESOPHOGRAM/BARIUM SWALLOW TECHNIQUE: Single contrast examination was performed using  thick barium. FLUOROSCOPY TIME:  Radiation Exposure Index (as provided by the fluoroscopic device): 18.9 mGy COMPARISON:  Chest x-ray 08/29/2012. FINDINGS: Cervical esophagus appears  normal. A along segment high-grade irregular stricture noted the mid thoracic esophagus. This is worrisome for esophageal malignancy. This results in partial obstruction of the esophagus. Endoscopic evaluation suggested. Incidental note is made of free intraperitoneal air, this is most likely from the patient's recent surgery. Clinical correlation suggested. IMPRESSION: 1. 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 This is worrisome for esophageal malignancy. 2. Free intraperitoneal air, most likely from patient's recent surgery. Critical Value/emergent results were called by telephone at the time of interpretation on 01/20/2015 at 11:47 am to [facility] , who verbally acknowledged these results. Electronically Signed   By: Marcello Moores  Register   On: 01/20/2015 11:52   Nm Pet Image Initial (pi) Skull Base To Thigh  01/23/2015  CLINICAL DATA:  Initial treatment strategy for recently diagnosed mid esophageal squamous cell cancer. EXAM: NUCLEAR MEDICINE PET SKULL BASE TO THIGH TECHNIQUE: 11.87 mCi F-18 FDG was injected intravenously. Full-ring PET imaging was performed from  the skull base to thigh after the radiotracer. CT data was obtained and used for attenuation correction and anatomic localization. FASTING BLOOD GLUCOSE:  Value: 68 mg/dl COMPARISON:  Abdominal pelvic CT 08/24/2012 and 08/27/2012. Esophagram 01/20/2015. FINDINGS: NECK There is hypermetabolic left supraclavicular lymphadenopathy. The individual nodes are difficult to measure on the noncontrast CT images, as they are difficult to separate from the adjacent vessels and musculature. Estimated size is up to 2 cm. SUV max is 14.2. There is no contralateral or upper cervical lymphadenopathy.There are no lesions of the pharyngeal mucosal space. CHEST There is marked hypermetabolic activity within the known in mid esophageal mass. This demonstrates an SUV max of 13.1. There is retained barium within the esophageal  lumen which is dilated proximal to this mass. Apart from the mass, no hypermetabolic mediastinal, hilar or axillary lymph nodes identified. There is no suspicious pulmonary activity. Emphysema, biapical scarring, mild bibasilar atelectasis and mild atherosclerosis are noted. ABDOMEN/PELVIS There is no hypermetabolic activity within the liver, adrenal glands, spleen or pancreas. There is no hypermetabolic nodal activity. Retained barium within the colon limits the quality of the examination, especially within the lower abdomen and pelvis. There is a stable small low-density right adrenal lesion consistent with an adenoma. Percutaneous G-tube, diffuse atherosclerosis, residual free intraperitoneal air, presumed pelvic surgical drain and Foley catheter are noted. Some gas within the left intrarenal collecting system is noted. SKELETON There is no hypermetabolic activity to suggest osseous metastatic disease. IMPRESSION: 1. The patient's known mid esophageal cancer is significantly hypermetabolic. There are metastases to left supraclavicular lymph nodes. 2. No other signs of metastatic disease. 3. Follow-up chest CT with contrast may be helpful to better evaluate the local extent of the mid esophageal cancer. 4. Recent postsurgical changes in the pelvis with associated residual pneumoperitoneum. Electronically Signed   By: Richardean Sale M.D.   On: 01/23/2015 14:44   Dg Chest Port 1 View  02/04/2015  CLINICAL DATA:  Esophageal squamous cell carcinoma. Emphysema. Port-A-Cath placement. EXAM: PORTABLE CHEST 1 VIEW COMPARISON:  01/21/2015 FINDINGS: Right-sided Port-A-Cath seen with tip overlying the distal SVC. No evidence of pneumothorax. Both lungs are clear. Heart size is normal. IMPRESSION: Right-sided Port-A-Cath in appropriate position. No evidence of pneumothorax or other acute findings. Electronically Signed   By: Earle Gell M.D.   On: 02/04/2015 15:16   Dg C-arm 1-60 Min-no Report  02/04/2015  CLINICAL  DATA: port placement C-ARM 1-60 MINUTES Fluoroscopy was utilized by the requesting physician.  No radiographic interpretation.    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 Plan to start palliative chemotherapy radiation for local control. Patient will start chemotherapy today; radiation next week.    Patient might benefit from "consolidation  5-FU based chemotherapy" after his concurrent chemoradiation therapy. A staging scan would be done after the treatment to reevaluate the status of the disease.  Patient will follow-up with me in approximately 2 weeks; with this third treatment of chemotherapy. He'll need CBC BMP in 1 week CBC CMP in 2 weeks  # Regards to anemia- appears to be more from anemia of chronic kidney disease;-monitor for now   # Spoken to Dr. Donella Stade; regarding the above plan. He agrees.  # Malnutrition- status post PEG tube. Recommend nutrition evaluation.  # Patient has pain on swallowing/PEG tube site- on oxycodone U prescription for oxycodone liquid was given.   All questions were answered. The patient knows to call  the clinic with any problems, questions or concerns.   I spent 25 minutes counseling the patient face to face. The total time spent in the appointment was 30 minutes and more than 50% was on counseling.     Cammie Sickle, MD 02/11/2015 9:24 AM

## 2015-02-11 NOTE — Progress Notes (Signed)
Here to start first chemo carbo/taxol.  Tube feedings going great no nausea, no abdominal pain. Can drink water without difficulty. Bowel movements with the aid of senna. No pain today

## 2015-02-12 ENCOUNTER — Ambulatory Visit: Payer: Self-pay

## 2015-02-16 ENCOUNTER — Ambulatory Visit: Payer: Self-pay

## 2015-02-17 ENCOUNTER — Ambulatory Visit: Payer: Self-pay

## 2015-02-18 ENCOUNTER — Inpatient Hospital Stay: Payer: Self-pay | Admitting: *Deleted

## 2015-02-18 ENCOUNTER — Ambulatory Visit
Admission: RE | Admit: 2015-02-18 | Discharge: 2015-02-18 | Disposition: A | Discharge status: Home / Self Care | Payer: Self-pay | Source: Ambulatory Visit | Attending: Radiation Oncology | Admitting: Radiation Oncology

## 2015-02-18 DIAGNOSIS — IMO0002 Reserved for concepts with insufficient information to code with codable children: Principal | ICD-10-CM

## 2015-02-18 DIAGNOSIS — IMO0001 Reserved for inherently not codable concepts without codable children: Secondary | ICD-10-CM

## 2015-02-18 LAB — CBC WITH DIFFERENTIAL/PLATELET
BASOS ABS: 0 10*3/uL (ref 0–0.1)
Basophils Relative: 1 %
EOS ABS: 0.1 10*3/uL (ref 0–0.7)
EOS PCT: 2 %
HCT: 28 % — ABNORMAL LOW (ref 40.0–52.0)
Hemoglobin: 9.1 g/dL — ABNORMAL LOW (ref 13.0–18.0)
Lymphocytes Relative: 20 %
Lymphs Abs: 1.6 10*3/uL (ref 1.0–3.6)
MCH: 27.7 pg (ref 26.0–34.0)
MCHC: 32.7 g/dL (ref 32.0–36.0)
MCV: 84.6 fL (ref 80.0–100.0)
MONO ABS: 0.8 10*3/uL (ref 0.2–1.0)
Monocytes Relative: 10 %
Neutro Abs: 5.5 10*3/uL (ref 1.4–6.5)
Neutrophils Relative %: 67 %
PLATELETS: 219 10*3/uL (ref 150–440)
RBC: 3.3 MIL/uL — AB (ref 4.40–5.90)
RDW: 15.7 % — AB (ref 11.5–14.5)
WBC: 8 10*3/uL (ref 3.8–10.6)

## 2015-02-19 ENCOUNTER — Ambulatory Visit: Payer: Self-pay

## 2015-02-20 ENCOUNTER — Ambulatory Visit: Payer: Self-pay

## 2015-02-23 ENCOUNTER — Other Ambulatory Visit: Payer: Self-pay | Admitting: *Deleted

## 2015-02-23 ENCOUNTER — Ambulatory Visit: Payer: Self-pay

## 2015-02-23 DIAGNOSIS — C154 Malignant neoplasm of middle third of esophagus: Secondary | ICD-10-CM

## 2015-02-24 ENCOUNTER — Other Ambulatory Visit: Payer: Self-pay | Admitting: Family Medicine

## 2015-02-24 ENCOUNTER — Telehealth: Payer: Self-pay

## 2015-02-24 ENCOUNTER — Ambulatory Visit: Payer: Self-pay

## 2015-02-24 DIAGNOSIS — C159 Malignant neoplasm of esophagus, unspecified: Secondary | ICD-10-CM

## 2015-02-24 MED ORDER — OXYCODONE HCL 5 MG/5ML PO SOLN: 5.0000 mg | Freq: Four times a day (QID) | ORAL | Status: DC | PRN

## 2015-02-24 NOTE — Telephone Encounter (Signed)
  Oncology Nurse Navigator Documentation    Navigator Encounter Type: Telephone (02/24/15 1100)                      Time Spent with Patient: 15 (02/24/15 1100)   Jeffrey [name] called and is requesting refill on pain medication. Marcello Fennel NP will refill and it will be available for pick up today.

## 2015-02-25 ENCOUNTER — Encounter (INDEPENDENT_AMBULATORY_CARE_PROVIDER_SITE_OTHER): Payer: Self-pay

## 2015-02-25 ENCOUNTER — Ambulatory Visit
Admission: RE | Admit: 2015-02-25 | Discharge: 2015-02-25 | Disposition: A | Discharge status: Home / Self Care | Payer: Self-pay | Source: Ambulatory Visit | Attending: Radiation Oncology | Admitting: Radiation Oncology

## 2015-02-25 ENCOUNTER — Inpatient Hospital Stay: Payer: Self-pay

## 2015-02-25 ENCOUNTER — Encounter: Payer: Self-pay | Admitting: Internal Medicine

## 2015-02-25 ENCOUNTER — Inpatient Hospital Stay (HOSPITAL_BASED_OUTPATIENT_CLINIC_OR_DEPARTMENT_OTHER): Payer: Self-pay | Admitting: Internal Medicine

## 2015-02-25 ENCOUNTER — Ambulatory Visit: Payer: Self-pay

## 2015-02-25 VITALS — BP 107/69 | HR 65 | Temp 98.6°F | Wt 126.5 lb

## 2015-02-25 VITALS — BP 147/85 | HR 81 | Temp 98.1°F

## 2015-02-25 DIAGNOSIS — C154 Malignant neoplasm of middle third of esophagus: Secondary | ICD-10-CM

## 2015-02-25 DIAGNOSIS — F17200 Nicotine dependence, unspecified, uncomplicated: Secondary | ICD-10-CM

## 2015-02-25 DIAGNOSIS — Z931 Gastrostomy status: Secondary | ICD-10-CM

## 2015-02-25 DIAGNOSIS — D649 Anemia, unspecified: Secondary | ICD-10-CM

## 2015-02-25 DIAGNOSIS — I129 Hypertensive chronic kidney disease with stage 1 through stage 4 chronic kidney disease, or unspecified chronic kidney disease: Secondary | ICD-10-CM

## 2015-02-25 DIAGNOSIS — C779 Secondary and unspecified malignant neoplasm of lymph node, unspecified: Secondary | ICD-10-CM

## 2015-02-25 DIAGNOSIS — Z8551 Personal history of malignant neoplasm of bladder: Secondary | ICD-10-CM

## 2015-02-25 DIAGNOSIS — N183 Chronic kidney disease, stage 3 (moderate): Secondary | ICD-10-CM

## 2015-02-25 DIAGNOSIS — E46 Unspecified protein-calorie malnutrition: Secondary | ICD-10-CM

## 2015-02-25 DIAGNOSIS — C155 Malignant neoplasm of lower third of esophagus: Secondary | ICD-10-CM

## 2015-02-25 DIAGNOSIS — J439 Emphysema, unspecified: Secondary | ICD-10-CM

## 2015-02-25 DIAGNOSIS — Z79899 Other long term (current) drug therapy: Secondary | ICD-10-CM

## 2015-02-25 DIAGNOSIS — R591 Generalized enlarged lymph nodes: Secondary | ICD-10-CM

## 2015-02-25 LAB — COMPREHENSIVE METABOLIC PANEL
ALK PHOS: 55 U/L (ref 38–126)
ALT: 9 U/L — AB (ref 17–63)
ANION GAP: 7 (ref 5–15)
AST: 15 U/L (ref 15–41)
Albumin: 3.4 g/dL — ABNORMAL LOW (ref 3.5–5.0)
BUN: 40 mg/dL — ABNORMAL HIGH (ref 6–20)
CALCIUM: 8.6 mg/dL — AB (ref 8.9–10.3)
CO2: 27 mmol/L (ref 22–32)
CREATININE: 1.8 mg/dL — AB (ref 0.61–1.24)
Chloride: 104 mmol/L (ref 101–111)
GFR, EST AFRICAN AMERICAN: 45 mL/min — AB (ref >60)
GFR, EST NON AFRICAN AMERICAN: 39 mL/min — AB (ref >60)
Glucose, Bld: 120 mg/dL — ABNORMAL HIGH (ref 65–99)
Potassium: 4.5 mmol/L (ref 3.5–5.1)
SODIUM: 138 mmol/L (ref 135–145)
TOTAL PROTEIN: 7.4 g/dL (ref 6.5–8.1)
Total Bilirubin: 0.4 mg/dL (ref 0.3–1.2)

## 2015-02-25 LAB — CBC WITH DIFFERENTIAL/PLATELET
BASOS PCT: 1 %
Basophils Absolute: 0 10*3/uL (ref 0–0.1)
EOS ABS: 0.1 10*3/uL (ref 0–0.7)
EOS PCT: 2 %
HCT: 26.5 % — ABNORMAL LOW (ref 40.0–52.0)
HEMOGLOBIN: 8.6 g/dL — AB (ref 13.0–18.0)
Lymphocytes Relative: 12 %
Lymphs Abs: 0.8 10*3/uL — ABNORMAL LOW (ref 1.0–3.6)
MCH: 27.6 pg (ref 26.0–34.0)
MCHC: 32.6 g/dL (ref 32.0–36.0)
MCV: 84.6 fL (ref 80.0–100.0)
Monocytes Absolute: 0.6 10*3/uL (ref 0.2–1.0)
Monocytes Relative: 8 %
NEUTROS PCT: 77 %
Neutro Abs: 5.3 10*3/uL (ref 1.4–6.5)
Platelets: 210 10*3/uL (ref 150–440)
RBC: 3.13 MIL/uL — AB (ref 4.40–5.90)
RDW: 15.3 % — ABNORMAL HIGH (ref 11.5–14.5)
WBC: 6.8 10*3/uL (ref 3.8–10.6)

## 2015-02-25 MED ORDER — HEPARIN SOD (PORK) LOCK FLUSH 100 UNIT/ML IV SOLN
500.0000 [IU] | Freq: Once | INTRAVENOUS | Status: AC | PRN
  Administered 2015-02-25: 500 [IU]

## 2015-02-25 MED ORDER — HYDROCORTISONE NA SUCCINATE PF 250 MG IJ SOLR
200.0000 mg | Freq: Once | INTRAMUSCULAR | Status: AC
  Administered 2015-02-25: 200 mg via INTRAVENOUS

## 2015-02-25 MED ORDER — DIPHENHYDRAMINE HCL 50 MG/ML IJ SOLN
25.0000 mg | Freq: Once | INTRAMUSCULAR | Status: AC | PRN
  Administered 2015-02-25: 25 mg via INTRAVENOUS

## 2015-02-25 MED ORDER — SODIUM CHLORIDE 0.9 % IJ SOLN
10.0000 mL | INTRAMUSCULAR | Status: DC | PRN
  Administered 2015-02-25: 10 mL
  Filled 2015-02-25: qty 10

## 2015-02-25 MED ORDER — SODIUM CHLORIDE 0.9 % IV SOLN
Freq: Once | INTRAVENOUS | Status: AC
  Administered 2015-02-25: 10:00:00 via INTRAVENOUS
  Filled 2015-02-25: qty 1000

## 2015-02-25 MED ORDER — DEXTROSE 5 % IV SOLN
45.0000 mg/m2 | Freq: Once | INTRAVENOUS | Status: AC
  Administered 2015-02-25: 72 mg via INTRAVENOUS
  Filled 2015-02-25: qty 12

## 2015-02-25 MED ORDER — DIPHENHYDRAMINE HCL 50 MG/ML IJ SOLN
50.0000 mg | Freq: Once | INTRAMUSCULAR | Status: AC
  Administered 2015-02-25: 50 mg via INTRAVENOUS
  Filled 2015-02-25: qty 1

## 2015-02-25 MED ORDER — SODIUM CHLORIDE 0.9 % IV SOLN
Freq: Once | INTRAVENOUS | Status: AC
  Administered 2015-02-25: 11:00:00 via INTRAVENOUS
  Filled 2015-02-25: qty 8

## 2015-02-25 MED ORDER — SODIUM CHLORIDE 0.9 % IV SOLN
110.0000 mg | Freq: Once | INTRAVENOUS | Status: AC
  Administered 2015-02-25: 110 mg via INTRAVENOUS
  Filled 2015-02-25: qty 11

## 2015-02-25 MED ORDER — FAMOTIDINE IN NACL 20-0.9 MG/50ML-% IV SOLN
20.0000 mg | Freq: Once | INTRAVENOUS | Status: AC
  Administered 2015-02-25: 20 mg via INTRAVENOUS
  Filled 2015-02-25: qty 50

## 2015-02-25 NOTE — Progress Notes (Signed)
Patient here today for f/u regarding esophageal cancer.  States he is having pain 9/10 on right chest wall from radiation.

## 2015-02-25 NOTE — Progress Notes (Signed)
Patient received taxol infusing at 262 ml/hr.  Patient had reaction when transfusion started.  Patient became hot, red and diaphoretic. Dr. Rogue Bussing called.  Infusion immediately stopped, 25 mg IV benadryl given and 200 mg solucortef given.  Oxygen started and vital sign monitored.  Patient recovered quickly.

## 2015-02-25 NOTE — Progress Notes (Signed)
.[place] NOTE  Patient Care Team: Adin Hector, MD as PCP - General (Internal Medicine) Clent Jacks, RN as Registered Nurse  CHIEF COMPLAINTS/PURPOSE OF CONSULTATION:   # OCT 2016- SQUAMOUS CELL CA of middle esophagus; Stage IV [EGD bx]; PET- Esopahgeal uptake & 2 CM Left Cervical LN-MET SQUAM; Carbo-taxol [Nov 9th]with RT [Nov 14th]  # malnutrition s/p PEG tube  # CKD [stage III]; Hx of non-invasive bladder ca [s/p cystectomy with neo-bladder; [facility]]  HISTORY OF PRESENTING ILLNESS:  Jeffrey [name] 64 y.o. 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  Patient has noted mild improvement in his swallowing. He denies any nausea vomiting. He is still using his PEG tube 5 cans a day. Not lost a significant weight. He complains of pain while swallowing/PEG tube site-on oxycodone liquid.   ROS: A complete 10 point review of system is done which is negative except mentioned above in history of present illness  MEDICAL HISTORY:  Past Medical History  Diagnosis Date  . Hypertension   . Bladder cancer ([place])   . Emphysema of lung ([place])   . Lung cancer ([place])   . Chronic kidney disease     SURGICAL HISTORY: Past Surgical History  Procedure Laterality Date  . Bladder removal    . Esophagogastroduodenoscopy (egd) with propofol N/A 01/21/2015    Procedure: ESOPHAGOGASTRODUODENOSCOPY (EGD) WITH PROPOFOL-looking in the esophagus stomach and upper small intestine to evaluate and treat;  Surgeon: [name], MD;  Location[facility] ENDOSCOPY;  Service: Endoscopy;  Laterality: N/A;  . Gastrostomy w/ feeding tube Left     placed end of october 2016  . Portacath placement Right 02/04/2015    Procedure: INSERTION PORT-A-CATH;  Surgeon: [name], MD;  Location: [facility] ORS;  Service: General;   Laterality: Right;  . Lymph node biopsy N/A 02/04/2015    Procedure: LYMPH NODE BIOPSY;  Surgeon: [name], MD;  Location: [facility] ORS;  Service: General;  Laterality: N/A;    SOCIAL HISTORY: Social History   Social History  . Marital Status: Married    Spouse Name: N/A  . Number of Children: N/A  . Years of Education: N/A   Occupational History  . Not on file.   Social History Main Topics  . Smoking status: Current Every Day Smoker -- 0.50 packs/day    Types: Cigarettes  . Smokeless tobacco: Current User    Types: Chew  . Alcohol Use: No  . Drug Use: No  . Sexual Activity: Not on file   Other Topics Concern  . Not on file   Social History Narrative    FAMILY HISTORY: Family History  Problem Relation Age of Onset  . Hypertension Other     ALLERGIES:  is allergic to aspirin.  MEDICATIONS:  Current Outpatient Prescriptions  Medication Sig Dispense Refill  . lidocaine-prilocaine (EMLA) cream Apply cream 1 hour before chemotherapy treatment, place a small amount of saran wrap over the cream to protect your clothing 30 g 1  . neomycin-bacitracin-polymyxin (NEOSPORIN) OINT Apply 1 application topically daily. 30 g 1  . Nutritional Supplements (FEEDING SUPPLEMENT, JEVITY 1.5 CAL/FIBER,) LIQD Place 237 mLs into feeding tube 6 (six) times daily. 1000 mL 5  . ondansetron (ZOFRAN) 4 MG/5ML solution Take 5 mLs (4 mg total) by mouth every 8 (eight) hours as needed for nausea or vomiting. 200 mL 0  .  oxyCODONE (ROXICODONE) 5 MG/5ML solution Place 5 mLs (5 mg total) into feeding tube every 6 (six) hours as needed for severe pain. 100 mL 0  . pantoprazole sodium (PROTONIX) 40 mg/20 mL PACK Place 20 mLs (40 mg total) into feeding tube 2 (two) times daily. 30 each 3   No current facility-administered medications for this visit.   Facility-Administered Medications Ordered in Other Visits  Medication Dose Route Frequency Provider Last Rate Last Dose  . sodium chloride 0.9 %  injection 10 mL  10 mL Intracatheter PRN Cammie Sickle, MD   10 mL at 02/25/15 0838      .  PHYSICAL EXAMINATION: ECOG PERFORMANCE STATUS: 1 - Symptomatic but completely ambulatory  Filed Vitals:   02/25/15 0911  BP: 107/69  Pulse: 65  Temp: 98.6 F (37 C)   Filed Weights   02/25/15 0911  Weight: 126 lb 8.7 oz (57.4 kg)    GENERAL: Thin built; moderately nourished. Alert, no distress and comfortable.   Accompanied by his wife. EYES: no pallor or icterus OROPHARYNX: no thrush or ulceration; good dentition  NECK: supple, no masses felt LYMPH:  ~2-3 CM palpable lymphadenopathy in the cervical on left side [? Level II] LUNGS: clear to auscultation and  No wheeze or crackles HEART/CVS: regular rate & rhythm and no murmurs; No lower extremity edema ABDOMEN: abdomen soft, non-tender and normal bowel sounds; PEG tube in place Musculoskeletal:no cyanosis of digits and no clubbing  PSYCH: alert & oriented x 3 with fluent speech NEURO: no focal motor/sensory deficits SKIN:  no rashes or significant lesions  LABORATORY DATA:  I have reviewed the data as listed Lab Results  Component Value Date   WBC 6.8 02/25/2015   HGB 8.6* 02/25/2015   HCT 26.5* 02/25/2015   MCV 84.6 02/25/2015   PLT 210 02/25/2015    Recent Labs  02/04/15 1037 02/11/15 0852 02/25/15 0827  NA 137 134* 138  K 4.6 4.6 4.5  CL 100* 99* 104  CO2 _0 GLUCOSE 94 99 120*  BUN 39* 44* 40*  CREATININE 2.14* 2.09* 1.80*  CALCIUM 8.7* 8.7* 8.6*  GFRNONAA 31* 32* 39*  GFRAA 36* 37* 45*  PROT 7.8 7.4 7.4  ALBUMIN 4.1 3.6 3.4*  AST _1 ALT 9* 9* 9*  ALKPHOS 62 57 55  BILITOT 0.9 0.4 0.4    RADIOGRAPHIC STUDIES: I have personally reviewed the radiological images as listed and agreed with the findings in the report. Dg Chest Port 1 View  02/04/2015  CLINICAL DATA:  Esophageal squamous cell carcinoma. Emphysema. Port-A-Cath placement. EXAM: PORTABLE CHEST 1 VIEW COMPARISON:  01/21/2015  FINDINGS: Right-sided Port-A-Cath seen with tip overlying the distal SVC. No evidence of pneumothorax. Both lungs are clear. Heart size is normal. IMPRESSION: Right-sided Port-A-Cath in appropriate position. No evidence of pneumothorax or other acute findings. Electronically Signed   By: Earle Gell M.D.   On: 02/04/2015 15:16   Dg C-arm 1-60 Min-no Report  02/04/2015  CLINICAL DATA: port placement C-ARM 1-60 MINUTES Fluoroscopy was utilized by the requesting physician.  No radiographic interpretation.    ASSESSMENT & PLAN:   # Squamous cell carcinoma of the esophagus- stage IV- oligo-metastatic.on Palliative chemo-RT. Patient is status post one cycle of weekly carbotaxol; . Patient tolerating chemotherapy radiation fairly well.  # Patient seems to be clinically improving. proceed with cycle #2 today.    # Regards to anemia- appears to be more from anemia of chronic kidney disease-hemoglobin 8.6;  monitor for now if it gets below 8 plan transfusion.  # Patient will follow-up with me in approximately 2 weeks. He'll need CBC BMP in 1 week CBC CMP in 2 weeks  # I again reviewed the images of the barium swallow with the patient's wife for understanding.   All questions were answered. The patient knows to call the clinic with any problems, questions or concerns.      Cammie Sickle, MD 02/25/2015 9:25 AM

## 2015-03-02 ENCOUNTER — Ambulatory Visit: Payer: Self-pay

## 2015-03-03 ENCOUNTER — Ambulatory Visit: Payer: Self-pay

## 2015-03-04 ENCOUNTER — Other Ambulatory Visit: Payer: Self-pay | Admitting: Internal Medicine

## 2015-03-04 ENCOUNTER — Inpatient Hospital Stay: Payer: Self-pay

## 2015-03-04 ENCOUNTER — Ambulatory Visit: Payer: Self-pay

## 2015-03-04 VITALS — BP 160/79 | HR 92 | Resp 22

## 2015-03-04 DIAGNOSIS — C154 Malignant neoplasm of middle third of esophagus: Secondary | ICD-10-CM

## 2015-03-04 LAB — CBC WITH DIFFERENTIAL/PLATELET
BASOS PCT: 1 %
Basophils Absolute: 0 10*3/uL (ref 0–0.1)
EOS ABS: 0 10*3/uL (ref 0–0.7)
EOS PCT: 1 %
HCT: 26.2 % — ABNORMAL LOW (ref 40.0–52.0)
HEMOGLOBIN: 8.7 g/dL — AB (ref 13.0–18.0)
LYMPHS ABS: 0.8 10*3/uL — AB (ref 1.0–3.6)
Lymphocytes Relative: 19 %
MCH: 28.1 pg (ref 26.0–34.0)
MCHC: 33.1 g/dL (ref 32.0–36.0)
MCV: 85 fL (ref 80.0–100.0)
MONOS PCT: 11 %
Monocytes Absolute: 0.5 10*3/uL (ref 0.2–1.0)
NEUTROS PCT: 68 %
Neutro Abs: 3.1 10*3/uL (ref 1.4–6.5)
PLATELETS: 191 10*3/uL (ref 150–440)
RBC: 3.08 MIL/uL — ABNORMAL LOW (ref 4.40–5.90)
RDW: 16 % — ABNORMAL HIGH (ref 11.5–14.5)
WBC: 4.5 10*3/uL (ref 3.8–10.6)

## 2015-03-04 LAB — BASIC METABOLIC PANEL
Anion gap: 7 (ref 5–15)
BUN: 33 mg/dL — AB (ref 6–20)
CALCIUM: 8.4 mg/dL — AB (ref 8.9–10.3)
CHLORIDE: 100 mmol/L — AB (ref 101–111)
CO2: 26 mmol/L (ref 22–32)
CREATININE: 1.69 mg/dL — AB (ref 0.61–1.24)
GFR, EST AFRICAN AMERICAN: 48 mL/min — AB (ref >60)
GFR, EST NON AFRICAN AMERICAN: 42 mL/min — AB (ref >60)
Glucose, Bld: 104 mg/dL — ABNORMAL HIGH (ref 65–99)
Potassium: 4.4 mmol/L (ref 3.5–5.1)
SODIUM: 133 mmol/L — AB (ref 135–145)

## 2015-03-04 MED ORDER — DIPHENHYDRAMINE HCL 50 MG/ML IJ SOLN
50.0000 mg | Freq: Once | INTRAMUSCULAR | Status: AC
  Administered 2015-03-04: 50 mg via INTRAVENOUS
  Filled 2015-03-04: qty 1

## 2015-03-04 MED ORDER — DEXTROSE 5 % IV SOLN
45.0000 mg/m2 | Freq: Once | INTRAVENOUS | Status: AC
  Administered 2015-03-04: 72 mg via INTRAVENOUS
  Filled 2015-03-04: qty 12

## 2015-03-04 MED ORDER — DIPHENHYDRAMINE HCL 50 MG/ML IJ SOLN
25.0000 mg | Freq: Once | INTRAMUSCULAR | Status: AC | PRN
  Administered 2015-03-04: 25 mg via INTRAVENOUS

## 2015-03-04 MED ORDER — HEPARIN SOD (PORK) LOCK FLUSH 100 UNIT/ML IV SOLN
500.0000 [IU] | Freq: Once | INTRAVENOUS | Status: AC | PRN
  Administered 2015-03-04: 500 [IU]
  Filled 2015-03-04: qty 5

## 2015-03-04 MED ORDER — SODIUM CHLORIDE 0.9 % IJ SOLN
10.0000 mL | INTRAMUSCULAR | Status: AC | PRN
  Administered 2015-03-04: 10 mL
  Filled 2015-03-04: qty 10

## 2015-03-04 MED ORDER — FAMOTIDINE IN NACL 20-0.9 MG/50ML-% IV SOLN
20.0000 mg | Freq: Once | INTRAVENOUS | Status: AC
  Administered 2015-03-04: 20 mg via INTRAVENOUS
  Filled 2015-03-04: qty 50

## 2015-03-04 MED ORDER — SODIUM CHLORIDE 0.9 % IV SOLN
Freq: Once | INTRAVENOUS | Status: AC
  Administered 2015-03-04: 11:00:00 via INTRAVENOUS
  Filled 2015-03-04: qty 1000

## 2015-03-04 MED ORDER — METHYLPREDNISOLONE SODIUM SUCC 125 MG IJ SOLR
125.0000 mg | Freq: Once | INTRAMUSCULAR | Status: AC | PRN
  Administered 2015-03-04: 125 mg via INTRAVENOUS

## 2015-03-04 MED ORDER — SODIUM CHLORIDE 0.9 % IV SOLN
110.0000 mg | Freq: Once | INTRAVENOUS | Status: AC
  Administered 2015-03-04: 110 mg via INTRAVENOUS
  Filled 2015-03-04: qty 11

## 2015-03-04 MED ORDER — FAMOTIDINE IN NACL 20-0.9 MG/50ML-% IV SOLN
20.0000 mg | Freq: Once | INTRAVENOUS | Status: AC | PRN
  Administered 2015-03-04: 20 mg via INTRAVENOUS

## 2015-03-04 MED ORDER — SODIUM CHLORIDE 0.9 % IV SOLN
Freq: Once | INTRAVENOUS | Status: AC
  Administered 2015-03-04: 11:00:00 via INTRAVENOUS
  Filled 2015-03-04: qty 8

## 2015-03-05 ENCOUNTER — Ambulatory Visit: Payer: Self-pay

## 2015-03-06 ENCOUNTER — Ambulatory Visit: Payer: Self-pay

## 2015-03-06 ENCOUNTER — Other Ambulatory Visit: Payer: Self-pay

## 2015-03-06 NOTE — Progress Notes (Addendum)
03/04/2015 1335 Taxol started at 28ml/hr (slow infusion programed 5 minutes) 2 minutes into infusion pt becomes flush, diaphoretic and c/o tightness in chest, taxol stopped, benadyrl 25mg  given, solucortef 200mg  given, MD notified, Dr Gildardo Griffes at chair side, O2 sats stable, pt's symptoms resolving, monitored pt for 30 minutes and then started carboplatin without incidence

## 2015-03-09 ENCOUNTER — Telehealth: Payer: Self-pay

## 2015-03-09 ENCOUNTER — Telehealth: Payer: Self-pay | Admitting: Internal Medicine

## 2015-03-09 ENCOUNTER — Other Ambulatory Visit: Payer: Self-pay | Admitting: Internal Medicine

## 2015-03-09 ENCOUNTER — Ambulatory Visit: Payer: Self-pay

## 2015-03-09 DIAGNOSIS — C154 Malignant neoplasm of middle third of esophagus: Secondary | ICD-10-CM

## 2015-03-09 MED ORDER — DEXAMETHASONE 4 MG PO TABS: ORAL_TABLET | ORAL | Status: DC

## 2015-03-09 NOTE — Telephone Encounter (Addendum)
Left message with Mr Sabatelli on 03/04/2015 at 8:56 am regarding MD referral to discuss tube feeding.  Asked Mr. Hovan to call RD back to schedule appointment.    As of today (03/09/2015), pt has not called back to schedule appointment.  Will reach back out to Mr. Timm tomorrow 03/10/2015.  Andre Swander B. Zenia Resides, [place], [place] (pager)

## 2015-03-09 NOTE — Telephone Encounter (Signed)
Called patient. Left msg. Explained that md sent rx to patient's pharmacy for steriods, which would prevent a possible allergic reaction during his chemotherapy.  I instructed the patient to pick up this rx asap. He will take decadron 4 mg 2 pills 12 hours before his chemotherapy and 1 pill the day after his chemotherapy (12 hours after chemo). I asked patient to call RN back asap to let him read back the instructions.

## 2015-03-09 NOTE — Telephone Encounter (Signed)
Please inform patient that I plan to start him on steroids/dexamethasone- to avoid the reaction to the chemotherapy. He has to start the steroids TOMORROW/that is the day before chemotherapy. I have sent the prescription to his pharmacy.

## 2015-03-10 ENCOUNTER — Telehealth: Payer: Self-pay

## 2015-03-10 ENCOUNTER — Ambulatory Visit: Payer: Self-pay

## 2015-03-10 NOTE — Telephone Encounter (Signed)
Spoke with patient. He did not get his voice message. I asked patient to go the pharmacy asap to get the decadron. The instructions were explained. Teach back process performed.

## 2015-03-10 NOTE — Telephone Encounter (Signed)
Talked to Jeffrey [name] this am and he is willing to come in on Friday, December 9th at [facility] for appointment.  Pt currently getting jevity 1.5 from [place] per [name].  [name], [place], [place] (pager)

## 2015-03-11 ENCOUNTER — Other Ambulatory Visit: Payer: Self-pay | Admitting: Internal Medicine

## 2015-03-11 ENCOUNTER — Inpatient Hospital Stay (HOSPITAL_BASED_OUTPATIENT_CLINIC_OR_DEPARTMENT_OTHER): Payer: Self-pay | Admitting: Internal Medicine

## 2015-03-11 ENCOUNTER — Ambulatory Visit
Admission: RE | Admit: 2015-03-11 | Discharge: 2015-03-11 | Disposition: A | Discharge status: Home / Self Care | Payer: Self-pay | Source: Ambulatory Visit | Attending: Radiation Oncology | Admitting: Radiation Oncology

## 2015-03-11 ENCOUNTER — Encounter: Payer: Self-pay | Admitting: Internal Medicine

## 2015-03-11 ENCOUNTER — Inpatient Hospital Stay (HOSPITAL_BASED_OUTPATIENT_CLINIC_OR_DEPARTMENT_OTHER): Payer: Self-pay

## 2015-03-11 ENCOUNTER — Inpatient Hospital Stay: Payer: Self-pay | Attending: Internal Medicine

## 2015-03-11 DIAGNOSIS — F17210 Nicotine dependence, cigarettes, uncomplicated: Secondary | ICD-10-CM

## 2015-03-11 DIAGNOSIS — J439 Emphysema, unspecified: Secondary | ICD-10-CM

## 2015-03-11 DIAGNOSIS — C7951 Secondary malignant neoplasm of bone: Secondary | ICD-10-CM

## 2015-03-11 DIAGNOSIS — E46 Unspecified protein-calorie malnutrition: Secondary | ICD-10-CM

## 2015-03-11 DIAGNOSIS — D649 Anemia, unspecified: Secondary | ICD-10-CM | POA: Insufficient documentation

## 2015-03-11 DIAGNOSIS — Z79899 Other long term (current) drug therapy: Secondary | ICD-10-CM

## 2015-03-11 DIAGNOSIS — Z8551 Personal history of malignant neoplasm of bladder: Secondary | ICD-10-CM | POA: Insufficient documentation

## 2015-03-11 DIAGNOSIS — I10 Essential (primary) hypertension: Secondary | ICD-10-CM

## 2015-03-11 DIAGNOSIS — N183 Chronic kidney disease, stage 3 (moderate): Secondary | ICD-10-CM

## 2015-03-11 DIAGNOSIS — C154 Malignant neoplasm of middle third of esophagus: Secondary | ICD-10-CM

## 2015-03-11 DIAGNOSIS — I129 Hypertensive chronic kidney disease with stage 1 through stage 4 chronic kidney disease, or unspecified chronic kidney disease: Secondary | ICD-10-CM | POA: Insufficient documentation

## 2015-03-11 DIAGNOSIS — Z931 Gastrostomy status: Secondary | ICD-10-CM

## 2015-03-11 DIAGNOSIS — Z5111 Encounter for antineoplastic chemotherapy: Secondary | ICD-10-CM | POA: Insufficient documentation

## 2015-03-11 LAB — CBC WITH DIFFERENTIAL/PLATELET
BASOS PCT: 0 %
Basophils Absolute: 0 10*3/uL (ref 0–0.1)
EOS ABS: 0 10*3/uL (ref 0–0.7)
EOS PCT: 0 %
HCT: 28.2 % — ABNORMAL LOW (ref 40.0–52.0)
Hemoglobin: 9.4 g/dL — ABNORMAL LOW (ref 13.0–18.0)
Lymphocytes Relative: 6 %
Lymphs Abs: 0.3 10*3/uL — ABNORMAL LOW (ref 1.0–3.6)
MCH: 28.7 pg (ref 26.0–34.0)
MCHC: 33.4 g/dL (ref 32.0–36.0)
MCV: 85.7 fL (ref 80.0–100.0)
MONO ABS: 0.4 10*3/uL (ref 0.2–1.0)
MONOS PCT: 8 %
NEUTROS PCT: 86 %
Neutro Abs: 3.7 10*3/uL (ref 1.4–6.5)
PLATELETS: 150 10*3/uL (ref 150–440)
RBC: 3.29 MIL/uL — ABNORMAL LOW (ref 4.40–5.90)
RDW: 16.8 % — AB (ref 11.5–14.5)
WBC: 4.4 10*3/uL (ref 3.8–10.6)

## 2015-03-11 LAB — COMPREHENSIVE METABOLIC PANEL
ALBUMIN: 3.9 g/dL (ref 3.5–5.0)
ALT: 9 U/L — ABNORMAL LOW (ref 17–63)
ANION GAP: 7 (ref 5–15)
AST: 19 U/L (ref 15–41)
Alkaline Phosphatase: 58 U/L (ref 38–126)
BUN: 33 mg/dL — ABNORMAL HIGH (ref 6–20)
CO2: 22 mmol/L (ref 22–32)
Calcium: 8.6 mg/dL — ABNORMAL LOW (ref 8.9–10.3)
Chloride: 103 mmol/L (ref 101–111)
Creatinine, Ser: 1.55 mg/dL — ABNORMAL HIGH (ref 0.61–1.24)
GFR calc non Af Amer: 46 mL/min — ABNORMAL LOW (ref >60)
GFR, EST AFRICAN AMERICAN: 54 mL/min — AB (ref >60)
GLUCOSE: 145 mg/dL — AB (ref 65–99)
POTASSIUM: 4.4 mmol/L (ref 3.5–5.1)
SODIUM: 132 mmol/L — AB (ref 135–145)
TOTAL PROTEIN: 7.4 g/dL (ref 6.5–8.1)
Total Bilirubin: 0.6 mg/dL (ref 0.3–1.2)

## 2015-03-11 MED ORDER — DIPHENHYDRAMINE HCL 50 MG/ML IJ SOLN
50.0000 mg | Freq: Once | INTRAMUSCULAR | Status: AC
  Administered 2015-03-11: 50 mg via INTRAVENOUS
  Filled 2015-03-11: qty 1

## 2015-03-11 MED ORDER — SODIUM CHLORIDE 0.9 % IV SOLN
131.2000 mg | Freq: Once | INTRAVENOUS | Status: AC
  Administered 2015-03-11: 130 mg via INTRAVENOUS
  Filled 2015-03-11: qty 13

## 2015-03-11 MED ORDER — SODIUM CHLORIDE 0.9 % IV SOLN
Freq: Once | INTRAVENOUS | Status: AC
  Administered 2015-03-11: 10:00:00 via INTRAVENOUS
  Filled 2015-03-11: qty 8

## 2015-03-11 MED ORDER — SODIUM CHLORIDE 0.9 % IJ SOLN
10.0000 mL | INTRAMUSCULAR | Status: DC | PRN
  Administered 2015-03-11: 10 mL
  Filled 2015-03-11: qty 10

## 2015-03-11 MED ORDER — FAMOTIDINE IN NACL 20-0.9 MG/50ML-% IV SOLN
20.0000 mg | Freq: Once | INTRAVENOUS | Status: AC
  Administered 2015-03-11: 20 mg via INTRAVENOUS
  Filled 2015-03-11: qty 50

## 2015-03-11 MED ORDER — HEPARIN SOD (PORK) LOCK FLUSH 100 UNIT/ML IV SOLN
500.0000 [IU] | Freq: Once | INTRAVENOUS | Status: AC | PRN
  Administered 2015-03-11: 500 [IU]
  Filled 2015-03-11: qty 5

## 2015-03-11 MED ORDER — SODIUM CHLORIDE 0.9 % IV SOLN
Freq: Once | INTRAVENOUS | Status: AC
Start: 2015-03-11 — End: 2015-03-11
  Administered 2015-03-11: 10:00:00 via INTRAVENOUS
  Filled 2015-03-11: qty 1000

## 2015-03-11 NOTE — Addendum Note (Signed)
Addended by: Sabino Gasser on: 03/11/2015 01:18 PM   Modules accepted: Orders

## 2015-03-11 NOTE — Progress Notes (Signed)
.[facility] NOTE  Patient Care Team: Adin Hector, MD as PCP - General (Internal Medicine) Clent Jacks, RN as Registered Nurse  CHIEF COMPLAINTS/PURPOSE OF CONSULTATION:   # OCT 2016- SQUAMOUS CELL CA of middle esophagus; Stage IV [EGD bx]; PET- Esopahgeal uptake & 2 CM Left Cervical LN-MET SQUAM; Carbo-taxol [Nov 9th]with RT [Nov 14th]; TAXOL HELD sec to ACUTE INFUSION REACTIONS; Cont Weekly Carbo- RT  # malnutrition s/p PEG tube  # CKD [stage III]; Hx of non-invasive bladder ca [s/p cystectomy with neo-bladder; [facility]]  HISTORY OF PRESENTING ILLNESS:  Jeffrey [name] 64 y.o. 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  Patient in the interim had continued reactions to Taxol with each infusion. He started steroids yesterday.  Patient continues to notice improvement of his swallowing. Denies any nausea vomiting.He is still using his PEG tube 5 cans a day. Not lost a significant weight. He complains of pain while swallowing/PEG tube site-on oxycodone liquid.   ROS: A complete 10 point review of system is done which is negative except mentioned above in history of present illness  MEDICAL HISTORY:  Past Medical History  Diagnosis Date  . Hypertension   . Bladder cancer ([place])   . Emphysema of lung ([place])   . Lung cancer ([place])   . Chronic kidney disease     SURGICAL HISTORY: Past Surgical History  Procedure Laterality Date  . Bladder removal    . Esophagogastroduodenoscopy (egd) with propofol N/A 01/21/2015    Procedure: ESOPHAGOGASTRODUODENOSCOPY (EGD) WITH PROPOFOL-looking in the esophagus stomach and upper small intestine to evaluate and treat;  Surgeon: [name], MD;  Location: [facility] ENDOSCOPY;  Service: Endoscopy;  Laterality: N/A;  . Gastrostomy w/ feeding tube Left      placed end of october 2016  . Portacath placement Right 02/04/2015    Procedure: INSERTION PORT-A-CATH;  Surgeon: [name], MD;  Location: [facility] ORS;  Service: General;  Laterality: Right;  . Lymph node biopsy N/A 02/04/2015    Procedure: LYMPH NODE BIOPSY;  Surgeon: [name], MD;  Location: [facility] ORS;  Service: General;  Laterality: N/A;    SOCIAL HISTORY: Social History   Social History  . Marital Status: Married    Spouse Name: N/A  . Number of Children: N/A  . Years of Education: N/A   Occupational History  . Not on file.   Social History Main Topics  . Smoking status: Current Every Day Smoker -- 0.50 packs/day    Types: Cigarettes  . Smokeless tobacco: Current User    Types: Chew  . Alcohol Use: No  . Drug Use: No  . Sexual Activity: Not on file   Other Topics Concern  . Not on file   Social History Narrative    FAMILY HISTORY: Family History  Problem Relation Age of Onset  . Hypertension Other     ALLERGIES:  is allergic to aspirin and taxol.  MEDICATIONS:  Current Outpatient Prescriptions  Medication Sig Dispense Refill  . dexamethasone (DECADRON) 4 MG tablet 2 pill every 12 hours; START THE DAY BEFORE CHEMO; and 1 pill every 12 hours x 1 day after chemo 60 tablet 1  . lidocaine-prilocaine (EMLA) cream Apply cream 1 hour before chemotherapy treatment, place a small amount of saran wrap over the cream to protect your clothing 30 g 1  . neomycin-bacitracin-polymyxin (NEOSPORIN) OINT Apply  1 application topically daily. 30 g 1  . Nutritional Supplements (FEEDING SUPPLEMENT, JEVITY 1.5 CAL/FIBER,) LIQD Place 237 mLs into feeding tube 6 (six) times daily. 1000 mL 5  . ondansetron (ZOFRAN) 4 MG/5ML solution Take 5 mLs (4 mg total) by mouth every 8 (eight) hours as needed for nausea or vomiting. 200 mL 0  . oxyCODONE (ROXICODONE) 5 MG/5ML solution Place 5 mLs (5 mg total) into feeding tube every 6 (six) hours as needed for severe pain. 100 mL 0  . pantoprazole  sodium (PROTONIX) 40 mg/20 mL PACK Place 20 mLs (40 mg total) into feeding tube 2 (two) times daily. 30 each 3   No current facility-administered medications for this visit.   Facility-Administered Medications Ordered in Other Visits  Medication Dose Route Frequency Provider Last Rate Last Dose  . sodium chloride 0.9 % injection 10 mL  10 mL Intracatheter PRN Cammie Sickle, MD   10 mL at 03/04/15 0933  . sodium chloride 0.9 % injection 10 mL  10 mL Intracatheter PRN Cammie Sickle, MD   10 mL at 03/11/15 0847      .  PHYSICAL EXAMINATION: ECOG PERFORMANCE STATUS: 1 - Symptomatic but completely ambulatory  There were no vitals filed for this visit. There were no vitals filed for this visit.  GENERAL: Thin built; moderately nourished. Alert, no distress and comfortable.   Accompanied by his daughter. EYES: no pallor or icterus OROPHARYNX: no thrush or ulceration; good dentition  NECK: supple, no masses felt LYMPH:  ~2 CM palpable lymphadenopathy in the cervical on left side [smaller/softer] LUNGS: clear to auscultation and  No wheeze or crackles HEART/CVS: regular rate & rhythm and no murmurs; No lower extremity edema ABDOMEN: abdomen soft, non-tender and normal bowel sounds; PEG tube in place Musculoskeletal:no cyanosis of digits and no clubbing  PSYCH: alert & oriented x 3 with fluent speech NEURO: no focal motor/sensory deficits SKIN:  no rashes or significant lesions  LABORATORY DATA:  I have reviewed the data as listed Lab Results  Component Value Date   WBC 4.4 03/11/2015   HGB 9.4* 03/11/2015   HCT 28.2* 03/11/2015   MCV 85.7 03/11/2015   PLT 150 03/11/2015    Recent Labs  02/11/15 0852 02/25/15 0827 03/04/15 0921 03/11/15 0837  NA 134* 138 133* 132*  K 4.6 4.5 4.4 4.4  CL 99* 104 100* 103  CO2 '27 27 26 22  ' GLUCOSE 99 120* 104* 145*  BUN 44* 40* 33* 33*  CREATININE 2.09* 1.80* 1.69* 1.55*  CALCIUM 8.7* 8.6* 8.4* 8.6*  GFRNONAA 32* 39* 42* 46*   GFRAA 37* 45* 48* 54*  PROT 7.4 7.4  --  7.4  ALBUMIN 3.6 3.4*  --  3.9  AST 19 15  --  19  ALT 9* 9*  --  9*  ALKPHOS 57 55  --  58  BILITOT 0.4 0.4  --  0.6    RADIOGRAPHIC STUDIES: I have personally reviewed the radiological images as listed and agreed with the findings in the report. No results found.  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 Patient having acute infusion reactions to Taxol/hence held.  # Clinically patient seems to be improving overall; continue radiation/along with single agent carboplatin. Patient has finished about 13 fractions of the total 28 fractions. In fact patient's left neck lymph node seems to be softer/smaller. My plan is to get a repeat PET scan  after finishing of radiation; and then likely recommend systemic chemotherapy. Again discussed with the patient and family that this is unfortunately incurable/and treatment are palliative.  # Acute hypersensitivity reaction to Taxol/ patient started on steroids yesterday prior to chemotherapy today; but reluctant to proceed with Taxol. Since this is a palliative treatment I think it's reasonable to hold off Taxol; continue carboplatin along with radiation.    # Regards to anemia- appears to be more from anemia of chronic kidney disease-hemoglobin 8.6; monitor for now if it gets below 8 plan transfusion.   # Patient will follow-up with me in approximately 2 weeks. He'll need CBC BMP in 1 week CBC CMP in 2 weeks  All questions were answered. The patient knows to call the clinic with any problems, questions or concerns. Also discussed with dr.Crystal re: the above plan.  # 25 minutes face-to-face with the patient discussing the above plan of care; more than 50% of time spent on prognosis/ natural history; counseling and coordination.       Cammie Sickle, MD 03/11/2015 9:20 AM

## 2015-03-12 ENCOUNTER — Telehealth: Payer: Self-pay

## 2015-03-12 ENCOUNTER — Other Ambulatory Visit: Payer: Self-pay | Admitting: Internal Medicine

## 2015-03-12 ENCOUNTER — Ambulatory Visit: Payer: Self-pay

## 2015-03-12 DIAGNOSIS — C159 Malignant neoplasm of esophagus, unspecified: Secondary | ICD-10-CM

## 2015-03-12 MED ORDER — OXYCODONE HCL 5 MG/5ML PO SOLN: 5.0000 mg | Freq: Four times a day (QID) | ORAL | Status: DC | PRN

## 2015-03-12 NOTE — Telephone Encounter (Signed)
  Oncology Nurse Navigator Documentation    Navigator Encounter Type: Telephone (03/12/15 1100)                      Time Spent with Patient: 15 (03/12/15 1100)   Received call from Jeffrey [name] stating he did not receive his refill for his pain medication yesterday. Notified Danley Danker RN and Dr Rogue Bussing will submit it and he can pick it up at registration desk today.

## 2015-03-13 ENCOUNTER — Encounter: Payer: Self-pay | Attending: Internal Medicine | Admitting: Dietician

## 2015-03-13 ENCOUNTER — Ambulatory Visit
Admission: RE | Admit: 2015-03-13 | Discharge: 2015-03-13 | Disposition: A | Discharge status: Home / Self Care | Payer: Self-pay | Source: Ambulatory Visit | Attending: Radiation Oncology | Admitting: Radiation Oncology

## 2015-03-13 ENCOUNTER — Ambulatory Visit: Payer: Self-pay

## 2015-03-13 NOTE — Progress Notes (Signed)
Medical Nutrition Therapy: Visit start time: 10:05am  end time: 11:00am  Assessment:  Diagnosis: Pt with squamous cell carcinoma of mid-esophagus stage IV.  PEG tube placed in 01/2015 for enteral nutrition secondary to dysphagia.  Currently undergoing chemotherapy and radiation therapy.   Past medical history: CKD, bladder cancer, dysphagia, HTN, emyphysema, lung cancer Psychosocial issues/ stress concerns: none Preferred learning method:  . No preference indicated  Current weight: 132.1 pounds in office this am  Height: 67 inches Noted wt 01/20/2015 121 pounds (hospital admission) IBW: 148 pounds Medications, supplements: decadron, zofran prn, oxycodone for pain, prn (pt uses tube for this medication), protonix, laxative per pt Estimated energy needs:    Kcals/d: [id number] kcals/d (30-35 kcals/kg)   Protein: 68-86 g/d (1.2-1.5 g/kg)  Fluid: 1710-1938ml/d (30-32ml/kg/d) Progress and evaluation: Jeffrey [name] has increased wt since PEG tube placement in 01/2015. Has been taking jevity 1.5 via PEG 5 cans per day and tolerating well (provides 1777 kcals, 76 g of protein and 978ml free water per day.   Typically has 1-2 bowel movements per day (with help of laxative per pt via tube). No issues with tube feeding tolerance. Able to tolerate some foods orally (ie water, gatorade, ensure/boost, ice cream, macaroni and cheese, mashed potatoes, chicken noodle soup) Physical activity: independent of all activities of daily living, drives self to own appointments  Dietary Intake:  Takes 1 can jevity 1.5 around 6:30-7am, 1 can Jevity 1.5 around 10:30-11am, 1 can Jevity 1.5 around 2pm, 1 can Jevity 1.5 around 5 pm and 1 can Jevity 1.5 at 8pm.   Has recently been able to take in water, gatorade, mashed potatoes, grits, ensure/boost (1 per day or every other day), macaroni and cheese.  If he has eaten more orally will hold off on giving can of jevity 1.5.  Per RN Steffanie Dunn with cancer center pt able to take anything  orally that he can tolerate.   Nutrition Care Education: Topics covered: Tube feeding at home (regimen, administration, safety concerns) [employer] at home booklet provided, copy of tube feeding regimen Nutrition for swallowing difficulty: [employer] booklet provided with eating tips for before, during and after cancer treatment.  Recipes provided for high calorie, high protein liquid foods.  Discussed soft solid options for pt to try.     Nutritional Diagnosis:  Increased nutrient needs related to cancer and cancer related treatments as evidenced by pt receiving enteral nutrition via PEG and starting to take po intake slowly as swallowing improving with treatment.   Intervention:   1.)Reviewed tube feeding regimen and recommend pt to continue jevity 1.5 cans per day with at least 85ml free water before and after feeding to keep tube clean.  Can cut back tube feeding to 3-4 feeding per day if eating well and tolerating oral diet.    2) Discussed high calorie, high protein foods with pt to safely take orally (full liquids, puree foods adding gravies and sauces  3) Card given to pt for follow-up appointment in 1 month.  Pt preferred to not schedule at this time and if has problems or nutrition questions will call and set up appointment  4) Provided pt with materials from [facility], organization that assists pt with receiving tube feeding supplies at no charge other than shipping charges.   Reminded pt, [facility] has supply of jevity 1.2 cans if needed but this formula has less kcals than jevity 1.5.    Education Materials given:  . General diet guidelines Eating Before, During and  After Cancer Treatment . Tube feeding regimen with [employer] Tube feeding at home booklet, [facility] information  Learner/ who was taught:  . Patient   Level of understanding: Marland Kitchen Verbalizes/ demonstrates competency  Demonstrated degree of understanding via:   Teach back Learning  barriers: . None  Monitoring and Evaluation:    1) Jevity 1.5, 5 cans per day unless eating orally then can cut back to 3-4 cans per day  2) Keep track of weight weekly.  Goal weight would be 145-150 pounds. Pt wanting to get back into 140s. Planning to go today and purchase scale to keep at home. Understands that if not gaining wt may need to increase tube feeding if unable to eat orally.          follow up: offered follow-up in 1 month but pt wants to call and schedule appointment if needed.    Jeffrey [name], [place], [place] (pager)

## 2015-03-13 NOTE — Progress Notes (Signed)
Jeffrey [name], RD came to meet with Jeffrey [name] to consult regarding his tube feeding and nutrition and her encounter is documented A "dummie" charge was entered as there is no charge to the patient.

## 2015-03-16 ENCOUNTER — Ambulatory Visit: Payer: Self-pay

## 2015-03-17 ENCOUNTER — Ambulatory Visit: Payer: Self-pay

## 2015-03-18 ENCOUNTER — Inpatient Hospital Stay: Payer: Self-pay

## 2015-03-18 ENCOUNTER — Ambulatory Visit: Payer: Self-pay | Admitting: Internal Medicine

## 2015-03-18 ENCOUNTER — Ambulatory Visit
Admission: RE | Admit: 2015-03-18 | Discharge: 2015-03-18 | Disposition: A | Discharge status: Home / Self Care | Payer: Self-pay | Source: Ambulatory Visit | Attending: Radiation Oncology | Admitting: Radiation Oncology

## 2015-03-18 VITALS — BP 105/69 | HR 69 | Temp 97.6°F

## 2015-03-18 DIAGNOSIS — C154 Malignant neoplasm of middle third of esophagus: Secondary | ICD-10-CM

## 2015-03-18 LAB — CBC WITH DIFFERENTIAL/PLATELET
BASOS ABS: 0 10*3/uL (ref 0–0.1)
BASOS PCT: 1 %
Eosinophils Absolute: 0 10*3/uL (ref 0–0.7)
Eosinophils Relative: 1 %
HEMATOCRIT: 27.1 % — AB (ref 40.0–52.0)
HEMOGLOBIN: 9 g/dL — AB (ref 13.0–18.0)
Lymphocytes Relative: 16 %
Lymphs Abs: 0.7 10*3/uL — ABNORMAL LOW (ref 1.0–3.6)
MCH: 28.9 pg (ref 26.0–34.0)
MCHC: 33 g/dL (ref 32.0–36.0)
MCV: 87.7 fL (ref 80.0–100.0)
Monocytes Absolute: 0.4 10*3/uL (ref 0.2–1.0)
Monocytes Relative: 11 %
NEUTROS ABS: 2.9 10*3/uL (ref 1.4–6.5)
NEUTROS PCT: 71 %
Platelets: 122 10*3/uL — ABNORMAL LOW (ref 150–440)
RBC: 3.1 MIL/uL — AB (ref 4.40–5.90)
RDW: 19.4 % — ABNORMAL HIGH (ref 11.5–14.5)
WBC: 4.1 10*3/uL (ref 3.8–10.6)

## 2015-03-18 LAB — BASIC METABOLIC PANEL
ANION GAP: 6 (ref 5–15)
BUN: 32 mg/dL — ABNORMAL HIGH (ref 6–20)
CHLORIDE: 104 mmol/L (ref 101–111)
CO2: 22 mmol/L (ref 22–32)
Calcium: 8.4 mg/dL — ABNORMAL LOW (ref 8.9–10.3)
Creatinine, Ser: 1.53 mg/dL — ABNORMAL HIGH (ref 0.61–1.24)
GFR calc non Af Amer: 47 mL/min — ABNORMAL LOW (ref >60)
GFR, EST AFRICAN AMERICAN: 55 mL/min — AB (ref >60)
Glucose, Bld: 94 mg/dL (ref 65–99)
Potassium: 4.4 mmol/L (ref 3.5–5.1)
Sodium: 132 mmol/L — ABNORMAL LOW (ref 135–145)

## 2015-03-18 MED ORDER — HEPARIN SOD (PORK) LOCK FLUSH 100 UNIT/ML IV SOLN
500.0000 [IU] | Freq: Once | INTRAVENOUS | Status: AC | PRN
  Administered 2015-03-18: 500 [IU]
  Filled 2015-03-18: qty 5

## 2015-03-18 MED ORDER — SODIUM CHLORIDE 0.9 % IV SOLN
132.2000 mg | Freq: Once | INTRAVENOUS | Status: AC
  Administered 2015-03-18: 130 mg via INTRAVENOUS
  Filled 2015-03-18: qty 13

## 2015-03-18 MED ORDER — DIPHENHYDRAMINE HCL 50 MG/ML IJ SOLN
50.0000 mg | Freq: Once | INTRAMUSCULAR | Status: AC
  Administered 2015-03-18: 50 mg via INTRAVENOUS
  Filled 2015-03-18: qty 1

## 2015-03-18 MED ORDER — SODIUM CHLORIDE 0.9 % IV SOLN
Freq: Once | INTRAVENOUS | Status: AC
  Administered 2015-03-18: 11:00:00 via INTRAVENOUS
  Filled 2015-03-18: qty 1000

## 2015-03-18 MED ORDER — FAMOTIDINE IN NACL 20-0.9 MG/50ML-% IV SOLN
20.0000 mg | Freq: Once | INTRAVENOUS | Status: AC
  Administered 2015-03-18: 20 mg via INTRAVENOUS
  Filled 2015-03-18: qty 50

## 2015-03-18 MED ORDER — SODIUM CHLORIDE 0.9 % IV SOLN
Freq: Once | INTRAVENOUS | Status: AC
  Administered 2015-03-18: 11:00:00 via INTRAVENOUS
  Filled 2015-03-18: qty 8

## 2015-03-18 MED ORDER — SODIUM CHLORIDE 0.9 % IJ SOLN
10.0000 mL | Freq: Once | INTRAMUSCULAR | Status: AC
  Administered 2015-03-18: 10 mL via INTRAVENOUS
  Filled 2015-03-18: qty 10

## 2015-03-19 ENCOUNTER — Ambulatory Visit: Payer: Self-pay

## 2015-03-20 ENCOUNTER — Ambulatory Visit: Payer: Self-pay

## 2015-03-23 ENCOUNTER — Ambulatory Visit: Payer: Self-pay

## 2015-03-24 ENCOUNTER — Other Ambulatory Visit: Payer: Self-pay | Admitting: *Deleted

## 2015-03-24 ENCOUNTER — Ambulatory Visit: Payer: Self-pay

## 2015-03-24 DIAGNOSIS — C154 Malignant neoplasm of middle third of esophagus: Secondary | ICD-10-CM

## 2015-03-25 ENCOUNTER — Inpatient Hospital Stay: Payer: Self-pay

## 2015-03-25 ENCOUNTER — Ambulatory Visit
Admission: RE | Admit: 2015-03-25 | Discharge: 2015-03-25 | Disposition: A | Discharge status: Home / Self Care | Payer: Self-pay | Source: Ambulatory Visit | Attending: Radiation Oncology | Admitting: Radiation Oncology

## 2015-03-25 ENCOUNTER — Inpatient Hospital Stay (HOSPITAL_BASED_OUTPATIENT_CLINIC_OR_DEPARTMENT_OTHER): Payer: Self-pay | Admitting: Internal Medicine

## 2015-03-25 ENCOUNTER — Telehealth: Payer: Self-pay | Admitting: Pharmacist

## 2015-03-25 VITALS — BP 110/70 | HR 73 | Temp 98.5°F | Ht 67.0 in | Wt 136.0 lb

## 2015-03-25 DIAGNOSIS — D649 Anemia, unspecified: Secondary | ICD-10-CM

## 2015-03-25 DIAGNOSIS — F17210 Nicotine dependence, cigarettes, uncomplicated: Secondary | ICD-10-CM

## 2015-03-25 DIAGNOSIS — N183 Chronic kidney disease, stage 3 (moderate): Secondary | ICD-10-CM

## 2015-03-25 DIAGNOSIS — C7951 Secondary malignant neoplasm of bone: Secondary | ICD-10-CM

## 2015-03-25 DIAGNOSIS — Z79899 Other long term (current) drug therapy: Secondary | ICD-10-CM

## 2015-03-25 DIAGNOSIS — I129 Hypertensive chronic kidney disease with stage 1 through stage 4 chronic kidney disease, or unspecified chronic kidney disease: Secondary | ICD-10-CM

## 2015-03-25 DIAGNOSIS — C154 Malignant neoplasm of middle third of esophagus: Secondary | ICD-10-CM

## 2015-03-25 DIAGNOSIS — C159 Malignant neoplasm of esophagus, unspecified: Secondary | ICD-10-CM

## 2015-03-25 DIAGNOSIS — Z931 Gastrostomy status: Secondary | ICD-10-CM

## 2015-03-25 DIAGNOSIS — C679 Malignant neoplasm of bladder, unspecified: Secondary | ICD-10-CM | POA: Insufficient documentation

## 2015-03-25 DIAGNOSIS — E46 Unspecified protein-calorie malnutrition: Secondary | ICD-10-CM

## 2015-03-25 DIAGNOSIS — Z8551 Personal history of malignant neoplasm of bladder: Secondary | ICD-10-CM

## 2015-03-25 DIAGNOSIS — J439 Emphysema, unspecified: Secondary | ICD-10-CM

## 2015-03-25 LAB — COMPREHENSIVE METABOLIC PANEL
ALK PHOS: 58 U/L (ref 38–126)
ALT: 8 U/L — AB (ref 17–63)
AST: 14 U/L — AB (ref 15–41)
Albumin: 3.7 g/dL (ref 3.5–5.0)
Anion gap: 6 (ref 5–15)
BUN: 25 mg/dL — AB (ref 6–20)
CALCIUM: 8.4 mg/dL — AB (ref 8.9–10.3)
CHLORIDE: 103 mmol/L (ref 101–111)
CO2: 23 mmol/L (ref 22–32)
CREATININE: 1.46 mg/dL — AB (ref 0.61–1.24)
GFR, EST AFRICAN AMERICAN: 58 mL/min — AB (ref >60)
GFR, EST NON AFRICAN AMERICAN: 50 mL/min — AB (ref >60)
Glucose, Bld: 90 mg/dL (ref 65–99)
Potassium: 4.7 mmol/L (ref 3.5–5.1)
SODIUM: 132 mmol/L — AB (ref 135–145)
Total Bilirubin: 0.4 mg/dL (ref 0.3–1.2)
Total Protein: 6.8 g/dL (ref 6.5–8.1)

## 2015-03-25 LAB — CBC WITH DIFFERENTIAL/PLATELET
Basophils Absolute: 0 10*3/uL (ref 0–0.1)
Basophils Relative: 1 %
Eosinophils Absolute: 0.1 10*3/uL (ref 0–0.7)
Eosinophils Relative: 2 %
HEMATOCRIT: 26.4 % — AB (ref 40.0–52.0)
Hemoglobin: 8.8 g/dL — ABNORMAL LOW (ref 13.0–18.0)
LYMPHS ABS: 0.4 10*3/uL — AB (ref 1.0–3.6)
MCH: 29.3 pg (ref 26.0–34.0)
MCHC: 33.2 g/dL (ref 32.0–36.0)
MCV: 88.3 fL (ref 80.0–100.0)
MONO ABS: 0.3 10*3/uL (ref 0.2–1.0)
NEUTROS ABS: 2.3 10*3/uL (ref 1.4–6.5)
Neutrophils Relative %: 73 %
Platelets: 81 10*3/uL — ABNORMAL LOW (ref 150–440)
RBC: 2.99 MIL/uL — ABNORMAL LOW (ref 4.40–5.90)
RDW: 20.8 % — ABNORMAL HIGH (ref 11.5–14.5)
WBC: 3.1 10*3/uL — ABNORMAL LOW (ref 3.8–10.6)

## 2015-03-25 LAB — SAMPLE TO BLOOD BANK

## 2015-03-25 MED ORDER — SODIUM CHLORIDE 0.9 % IV SOLN
130.0000 mg | Freq: Once | INTRAVENOUS | Status: AC
  Administered 2015-03-25: 130 mg via INTRAVENOUS
  Filled 2015-03-25: qty 13

## 2015-03-25 MED ORDER — SODIUM CHLORIDE 0.9 % IV SOLN
Freq: Once | INTRAVENOUS | Status: AC
  Administered 2015-03-25: 12:00:00 via INTRAVENOUS
  Filled 2015-03-25: qty 8

## 2015-03-25 MED ORDER — SODIUM CHLORIDE 0.9 % IJ SOLN
10.0000 mL | INTRAMUSCULAR | Status: DC | PRN
  Filled 2015-03-25: qty 10

## 2015-03-25 MED ORDER — SODIUM CHLORIDE 0.9 % IV SOLN
Freq: Once | INTRAVENOUS | Status: AC
  Administered 2015-03-25: 12:00:00 via INTRAVENOUS
  Filled 2015-03-25: qty 1000

## 2015-03-25 MED ORDER — HEPARIN SOD (PORK) LOCK FLUSH 100 UNIT/ML IV SOLN
500.0000 [IU] | Freq: Once | INTRAVENOUS | Status: AC
  Administered 2015-03-25: 500 [IU] via INTRAVENOUS
  Filled 2015-03-25: qty 5

## 2015-03-25 MED ORDER — OXYCODONE HCL 5 MG/5ML PO SOLN: 5.0000 mg | Freq: Four times a day (QID) | ORAL | Status: DC | PRN

## 2015-03-25 MED ORDER — SODIUM CHLORIDE 0.9 % IV SOLN: 136.2000 mg | Freq: Once | INTRAVENOUS | Status: DC

## 2015-03-25 MED ORDER — FAMOTIDINE IN NACL 20-0.9 MG/50ML-% IV SOLN
20.0000 mg | Freq: Once | INTRAVENOUS | Status: AC
  Administered 2015-03-25: 20 mg via INTRAVENOUS
  Filled 2015-03-25: qty 50

## 2015-03-25 MED ORDER — DIPHENHYDRAMINE HCL 50 MG/ML IJ SOLN
50.0000 mg | Freq: Once | INTRAMUSCULAR | Status: AC
  Administered 2015-03-25: 50 mg via INTRAVENOUS
  Filled 2015-03-25: qty 1

## 2015-03-25 NOTE — Progress Notes (Signed)
Spoke with Dr. Rogue Bussing regarding treatment today and he gave the go ahead for his treatment today.

## 2015-03-25 NOTE — Progress Notes (Signed)
.[facility] CONSULT NOTE  Patient Care Team: Bert J Klein III, MD as PCP - General (Internal Medicine) Kristi D Stanton, RN as Registered Nurse  CHIEF COMPLAINTS/PURPOSE OF CONSULTATION:   # OCT 2016- SQUAMOUS CELL CA of middle esophagus; Stage IV [EGD bx]; PET- Esopahgeal uptake & 2 CM Left Cervical LN-MET SQUAM; Carbo-taxol [Nov 9th]with RT [Nov 14th];  Cont Weekly Carbo- RT  # TAXOL HELD sec to ACUTE INFUSION REACTIONS  # malnutrition s/p PEG tube  # CKD [stage III]; Hx of non-invasive bladder ca [s/p cystectomy with neo-bladder; [facility]]  HISTORY OF PRESENTING ILLNESS:  Jeffrey [name] 64 y.o. 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  Patient's appetite is improving. Denies any nausea vomiting. He is gaining weight. He is able to swallow liquids; and also eat some solids [improvement from baseline]. He is still using his PEG tube 5 cans a day. Not lost a significant weight. He complains of pain while swallowing/PEG tube site-on oxycodone liquid.   ROS: A complete 10 point review of system is done which is negative except mentioned above in history of present illness  MEDICAL HISTORY:  Past Medical History  Diagnosis Date  . Hypertension   . Bladder cancer ([facility])   . Emphysema of lung ([facility])   . Lung cancer ([facility])   . Chronic kidney disease     SURGICAL HISTORY: Past Surgical History  Procedure Laterality Date  . Bladder removal    . Esophagogastroduodenoscopy (egd) with propofol N/A 01/21/2015    Procedure: ESOPHAGOGASTRODUODENOSCOPY (EGD) WITH PROPOFOL-looking in the esophagus stomach and upper small intestine to evaluate and treat;  Surgeon: Martin U Skulskie, MD;  Location: [facility] ENDOSCOPY;  Service: Endoscopy;  Laterality: N/A;  . Gastrostomy w/ feeding tube Left     placed end of october  2016  . Portacath placement Right 02/04/2015    Procedure: INSERTION PORT-A-CATH;  Surgeon: Timothy Oaks, MD;  Location: [facility] ORS;  Service: General;  Laterality: Right;  . Lymph node biopsy N/A 02/04/2015    Procedure: LYMPH NODE BIOPSY;  Surgeon: Timothy Oaks, MD;  Location: [facility] ORS;  Service: General;  Laterality: N/A;    SOCIAL HISTORY: Social History   Social History  . Marital Status: Married    Spouse Name: N/A  . Number of Children: N/A  . Years of Education: N/A   Occupational History  . Not on file.   Social History Main Topics  . Smoking status: Current Every Day Smoker -- 0.50 packs/day    Types: Cigarettes  . Smokeless tobacco: Current User    Types: Chew  . Alcohol Use: No  . Drug Use: No  . Sexual Activity: Not on file   Other Topics Concern  . Not on file   Social History Narrative    FAMILY HISTORY: Family History  Problem Relation Age of Onset  . Hypertension Other     ALLERGIES:  is allergic to aspirin and taxol.  MEDICATIONS:  Current Outpatient Prescriptions  Medication Sig Dispense Refill  . lidocaine-prilocaine (EMLA) cream Apply cream 1 hour before chemotherapy treatment, place a small amount of saran wrap over the cream to protect your clothing 30 g 1  . neomycin-bacitracin-polymyxin (NEOSPORIN) OINT Apply 1 application topically daily. 30 g 1  . Nutritional Supplements (FEEDING SUPPLEMENT, JEVITY 1.5 CAL/FIBER,) LIQD Place 237 mLs into feeding tube 6 (six) times daily. 1000 mL 5  .   oxyCODONE (ROXICODONE) 5 MG/5ML solution Place 5 mLs (5 mg total) into feeding tube every 6 (six) hours as needed for severe pain. 100 mL 0  . pantoprazole sodium (PROTONIX) 40 mg/20 mL PACK Place 20 mLs (40 mg total) into feeding tube 2 (two) times daily. 30 each 3   No current facility-administered medications for this visit.   Facility-Administered Medications Ordered in Other Visits  Medication Dose Route Frequency Provider Last Rate Last Dose  . heparin  lock flush 100 unit/mL  500 Units Intravenous Once Govinda R Brahmanday, MD      . sodium chloride 0.9 % injection 10 mL  10 mL Intracatheter PRN Govinda R Brahmanday, MD   10 mL at 03/04/15 0933  . sodium chloride 0.9 % injection 10 mL  10 mL Intravenous PRN Govinda R Brahmanday, MD          .  PHYSICAL EXAMINATION: ECOG PERFORMANCE STATUS: 1 - Symptomatic but completely ambulatory  Filed Vitals:   03/25/15 1028  BP: 110/70  Pulse: 73  Temp: 98.5 F (36.9 C)   Filed Weights   03/25/15 1028  Weight: 136 lb 0.4 oz (61.7 kg)    GENERAL: Thin built; moderately nourished. Alert, no distress and comfortable.   Accompanied by his daughter. EYES: no pallor or icterus OROPHARYNX: no thrush or ulceration; good dentition  NECK: supple, no masses felt LYMPH:  ~2 CM palpable lymphadenopathy in the cervical on left side [smaller/softer] LUNGS: clear to auscultation and  No wheeze or crackles HEART/CVS: regular rate & rhythm and no murmurs; No lower extremity edema ABDOMEN: abdomen soft, non-tender and normal bowel sounds; PEG tube in place Musculoskeletal:no cyanosis of digits and no clubbing  PSYCH: alert & oriented x 3 with fluent speech NEURO: no focal motor/sensory deficits SKIN:  no rashes or significant lesions  LABORATORY DATA:  I have reviewed the data as listed Lab Results  Component Value Date   WBC 3.1* 03/25/2015   HGB 8.8* 03/25/2015   HCT 26.4* 03/25/2015   MCV 88.3 03/25/2015   PLT 81* 03/25/2015    Recent Labs  02/25/15 0827  03/11/15 0837 03/18/15 0949 03/25/15 1003  NA 138  < > 132* 132* 132*  K 4.5  < > 4.4 4.4 4.7  CL 104  < > 103 104 103  CO2 27  < > 22 22 23  GLUCOSE 120*  < > 145* 94 90  BUN 40*  < > 33* 32* 25*  CREATININE 1.80*  < > 1.55* 1.53* 1.46*  CALCIUM 8.6*  < > 8.6* 8.4* 8.4*  GFRNONAA 39*  < > 46* 47* 50*  GFRAA 45*  < > 54* 55* 58*  PROT 7.4  --  7.4  --  6.8  ALBUMIN 3.4*  --  3.9  --  3.7  AST 15  --  19  --  14*  ALT 9*  --   9*  --  8*  ALKPHOS 55  --  58  --  58  BILITOT 0.4  --  0.6  --  0.4  < > = values in this interval not displayed.  RADIOGRAPHIC STUDIES: I have personally reviewed the radiological images as listed and agreed with the findings in the report. No results found.  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 Patient having acute infusion reactions to Taxol/hence held.  # Clinically patient seems to be improving/responding overall; continue radiation/along with   single agent carboplatin. Patient will finish radiation this week.   # I plan to get a follow-up PET scan in approximately 2 weeks since finishing treatment/ in the first week of January; patient follow-up with me in the week of January 9th. Reviewed with the patient's wife that- the next plan of therapy would be based upon restaging PET scan. Patient has local neck disease- radiation could be recommended; however if he has widespread metastatic disease- I would recommend systemic therapy.  # Acute hypersensitivity reaction to Taxol-discontinued.   # Regards to anemia- appears to be more from anemia of chronic kidney disease-awaiting hemoglobin from today.  #  Thrombocytopenia platelets around 80-  Okay to proceed with chemotherapy.  Again patient labs  3 weeks.  # Chronic kidney disease- creatinine stable at 1.5.  # Pain secondary to malignancy/ PEG tube- continue oxycodone.  # 25 minutes face-to-face with the patient discussing the above plan of care; more than 50% of time spent on prognosis/ natural history; counseling and coordination.       Cammie Sickle, MD 03/25/2015 11:00 AM

## 2015-03-25 NOTE — Telephone Encounter (Signed)
Plts 81. Called and spoke to Dr B. He would like to proceed with treatment at this time.

## 2015-03-26 ENCOUNTER — Ambulatory Visit
Admission: RE | Admit: 2015-03-26 | Discharge: 2015-03-26 | Disposition: A | Discharge status: Home / Self Care | Payer: Self-pay | Source: Ambulatory Visit | Attending: Radiation Oncology | Admitting: Radiation Oncology

## 2015-03-27 ENCOUNTER — Ambulatory Visit
Admission: RE | Admit: 2015-03-27 | Discharge: 2015-03-27 | Disposition: A | Discharge status: Home / Self Care | Payer: Self-pay | Source: Ambulatory Visit | Attending: Radiation Oncology | Admitting: Radiation Oncology

## 2015-04-07 ENCOUNTER — Encounter
Admission: RE | Admit: 2015-04-07 | Discharge: 2015-04-07 | Disposition: A | Discharge status: Home / Self Care | Payer: Self-pay | Source: Ambulatory Visit | Attending: Internal Medicine | Admitting: Internal Medicine

## 2015-04-07 DIAGNOSIS — C159 Malignant neoplasm of esophagus, unspecified: Secondary | ICD-10-CM | POA: Insufficient documentation

## 2015-04-07 DIAGNOSIS — C154 Malignant neoplasm of middle third of esophagus: Secondary | ICD-10-CM | POA: Insufficient documentation

## 2015-04-07 LAB — GLUCOSE, CAPILLARY: GLUCOSE-CAPILLARY: 78 mg/dL (ref 65–99)

## 2015-04-07 MED ORDER — FLUDEOXYGLUCOSE F - 18 (FDG) INJECTION
12.5800 | Freq: Once | INTRAVENOUS | Status: AC | PRN
  Administered 2015-04-07: 12.58 via INTRAVENOUS

## 2015-04-10 ENCOUNTER — Encounter: Payer: Self-pay | Admitting: *Deleted

## 2015-04-10 NOTE — Progress Notes (Signed)
pt's apt with Dr. Tish Men is scheduled on 04/23/15. This apt should have been 04/13/15. pet is abnormal. Msg sent to scheduling to move the apt up as md needs to review the results sooner with patient than 04/23/15.

## 2015-04-13 ENCOUNTER — Inpatient Hospital Stay: Payer: Self-pay

## 2015-04-13 ENCOUNTER — Inpatient Hospital Stay: Payer: Self-pay | Admitting: Internal Medicine

## 2015-04-14 ENCOUNTER — Encounter: Payer: Self-pay | Admitting: *Deleted

## 2015-04-14 NOTE — Progress Notes (Signed)
msg sent to schedule. Due to inclement weather, 04/13/15 apt was cnl. Per MD patient needs to come this week for results.

## 2015-04-15 ENCOUNTER — Inpatient Hospital Stay: Payer: Self-pay | Attending: Internal Medicine | Admitting: Internal Medicine

## 2015-04-15 ENCOUNTER — Inpatient Hospital Stay: Payer: Self-pay

## 2015-04-15 VITALS — BP 119/77 | HR 64 | Temp 97.2°F | Resp 18 | Ht 67.0 in | Wt 137.1 lb

## 2015-04-15 DIAGNOSIS — C770 Secondary and unspecified malignant neoplasm of lymph nodes of head, face and neck: Secondary | ICD-10-CM | POA: Insufficient documentation

## 2015-04-15 DIAGNOSIS — Z931 Gastrostomy status: Secondary | ICD-10-CM | POA: Insufficient documentation

## 2015-04-15 DIAGNOSIS — D649 Anemia, unspecified: Secondary | ICD-10-CM | POA: Insufficient documentation

## 2015-04-15 DIAGNOSIS — C154 Malignant neoplasm of middle third of esophagus: Secondary | ICD-10-CM

## 2015-04-15 DIAGNOSIS — N183 Chronic kidney disease, stage 3 (moderate): Secondary | ICD-10-CM | POA: Insufficient documentation

## 2015-04-15 DIAGNOSIS — F17210 Nicotine dependence, cigarettes, uncomplicated: Secondary | ICD-10-CM | POA: Insufficient documentation

## 2015-04-15 DIAGNOSIS — I129 Hypertensive chronic kidney disease with stage 1 through stage 4 chronic kidney disease, or unspecified chronic kidney disease: Secondary | ICD-10-CM | POA: Insufficient documentation

## 2015-04-15 DIAGNOSIS — E46 Unspecified protein-calorie malnutrition: Secondary | ICD-10-CM | POA: Insufficient documentation

## 2015-04-15 DIAGNOSIS — Z8551 Personal history of malignant neoplasm of bladder: Secondary | ICD-10-CM | POA: Insufficient documentation

## 2015-04-15 DIAGNOSIS — K8020 Calculus of gallbladder without cholecystitis without obstruction: Secondary | ICD-10-CM | POA: Insufficient documentation

## 2015-04-15 DIAGNOSIS — Z906 Acquired absence of other parts of urinary tract: Secondary | ICD-10-CM | POA: Insufficient documentation

## 2015-04-15 DIAGNOSIS — C159 Malignant neoplasm of esophagus, unspecified: Secondary | ICD-10-CM

## 2015-04-15 DIAGNOSIS — I2510 Atherosclerotic heart disease of native coronary artery without angina pectoris: Secondary | ICD-10-CM | POA: Insufficient documentation

## 2015-04-15 DIAGNOSIS — J439 Emphysema, unspecified: Secondary | ICD-10-CM | POA: Insufficient documentation

## 2015-04-15 DIAGNOSIS — Z79899 Other long term (current) drug therapy: Secondary | ICD-10-CM | POA: Insufficient documentation

## 2015-04-15 DIAGNOSIS — G893 Neoplasm related pain (acute) (chronic): Secondary | ICD-10-CM | POA: Insufficient documentation

## 2015-04-15 LAB — COMPREHENSIVE METABOLIC PANEL
ALBUMIN: 4.2 g/dL (ref 3.5–5.0)
ALK PHOS: 60 U/L (ref 38–126)
ALT: 7 U/L — AB (ref 17–63)
ANION GAP: 6 (ref 5–15)
AST: 14 U/L — AB (ref 15–41)
BILIRUBIN TOTAL: 0.4 mg/dL (ref 0.3–1.2)
BUN: 27 mg/dL — ABNORMAL HIGH (ref 6–20)
CHLORIDE: 104 mmol/L (ref 101–111)
CO2: 25 mmol/L (ref 22–32)
CREATININE: 1.82 mg/dL — AB (ref 0.61–1.24)
Calcium: 8.8 mg/dL — ABNORMAL LOW (ref 8.9–10.3)
GFR calc Af Amer: 44 mL/min — ABNORMAL LOW (ref >60)
GFR calc non Af Amer: 38 mL/min — ABNORMAL LOW (ref >60)
Glucose, Bld: 102 mg/dL — ABNORMAL HIGH (ref 65–99)
Potassium: 4.8 mmol/L (ref 3.5–5.1)
SODIUM: 135 mmol/L (ref 135–145)
Total Protein: 7.8 g/dL (ref 6.5–8.1)

## 2015-04-15 LAB — CBC WITH DIFFERENTIAL/PLATELET
BASOS ABS: 0 10*3/uL (ref 0–0.1)
BASOS PCT: 1 %
EOS ABS: 0 10*3/uL (ref 0–0.7)
Eosinophils Relative: 0 %
HEMATOCRIT: 29.9 % — AB (ref 40.0–52.0)
HEMOGLOBIN: 9.8 g/dL — AB (ref 13.0–18.0)
Lymphocytes Relative: 33 %
Lymphs Abs: 1 10*3/uL (ref 1.0–3.6)
MCH: 30.4 pg (ref 26.0–34.0)
MCHC: 32.9 g/dL (ref 32.0–36.0)
MCV: 92.5 fL (ref 80.0–100.0)
MONOS PCT: 18 %
Monocytes Absolute: 0.6 10*3/uL (ref 0.2–1.0)
NEUTROS ABS: 1.5 10*3/uL (ref 1.4–6.5)
NEUTROS PCT: 48 %
Platelets: 115 10*3/uL — ABNORMAL LOW (ref 150–440)
RBC: 3.23 MIL/uL — AB (ref 4.40–5.90)
RDW: 24.2 % — ABNORMAL HIGH (ref 11.5–14.5)
WBC: 3.1 10*3/uL — AB (ref 3.8–10.6)

## 2015-04-15 NOTE — Progress Notes (Signed)
.[place] NOTE  Patient Care Team: Adin Hector, MD as PCP - General (Internal Medicine) Clent Jacks, RN as Registered Nurse  CHIEF COMPLAINTS/PURPOSE OF CONSULTATION:   # OCT 2016- SQUAMOUS CELL CA of middle esophagus; Stage IV [EGD bx]; PET- Esopahgeal uptake & 2 CM Left Cervical LN-MET SQUAM; Carbo-taxol [Nov 9th]with RT [Nov 14th];  Cont Weekly Carbo- RT; JAN 3rd- 2017- stable esophageal uptake/slight increase neck adenopathy- recommend local radiation to the neck with carboplatin  # TAXOL HELD sec to ACUTE INFUSION REACTIONS  # malnutrition s/p PEG tube  # CKD [stage III]; Hx of non-invasive bladder ca [s/p cystectomy with neo-bladder; [facility]]  HISTORY OF PRESENTING ILLNESS:  Jeffrey [name] 65 y.o. 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  Patient's appetite is improving. Denies any nausea vomiting. He is gaining weight.- His weight has improved from a nadir of 121 pounds to 137 pounds today. His swallowing is improved; he rates his swallowing 5 on a scale of 10 [10 being the worst.] He complains of pain while swallowing/PEG tube site-on oxycodone liquid.   ROS: A complete 10 point review of system is done which is negative except mentioned above in history of present illness  MEDICAL HISTORY:  Past Medical History  Diagnosis Date  . Hypertension   . Bladder cancer ([place])   . Emphysema of lung ([place])   . Lung cancer ([place])   . Chronic kidney disease     SURGICAL HISTORY: Past Surgical History  Procedure Laterality Date  . Bladder removal    . Esophagogastroduodenoscopy (egd) with propofol N/A 01/21/2015    Procedure: ESOPHAGOGASTRODUODENOSCOPY (EGD) WITH PROPOFOL-looking in the esophagus stomach and upper small intestine to evaluate and treat;  Surgeon: [name],  MD;  Location: [facility] END[facility];  Service: Endoscopy;  Laterality: N/A;  . Gastrostomy w/ feeding tube Left     placed end of october 2016  . Portacath placement Right 02/04/2015    Procedure: INSERTION PORT-A-CATH;  Surgeon: [name], MD;  Location: [facility] ORS;  Service: General;  Laterality: Right;  . Lymph node biopsy N/A 02/04/2015    Procedure: LYMPH NODE BIOPSY;  Surgeon: [name], MD;  Location: [facility] ORS;  Service: General;  Laterality: N/A;    SOCIAL HISTORY: Social History   Social History  . Marital Status: Married    Spouse Name: N/A  . Number of Children: N/A  . Years of Education: N/A   Occupational History  . Not on file.   Social History Main Topics  . Smoking status: Current Every Day Smoker -- 0.50 packs/day    Types: Cigarettes  . Smokeless tobacco: Current User    Types: Chew  . Alcohol Use: No  . Drug Use: No  . Sexual Activity: Not on file   Other Topics Concern  . Not on file   Social History Narrative    FAMILY HISTORY: Family History  Problem Relation Age of Onset  . Hypertension Other     ALLERGIES:  is allergic to aspirin and taxol.  MEDICATIONS:  Current Outpatient Prescriptions  Medication Sig Dispense Refill  . lidocaine-prilocaine (EMLA) cream Apply cream 1 hour before chemotherapy treatment, place a small amount of saran wrap over the cream to protect your clothing 30 g 1  . neomycin-bacitracin-polymyxin (NEOSPORIN) OINT Apply 1 application topically daily. 30 g 1  . Nutritional Supplements (  FEEDING SUPPLEMENT, JEVITY 1.5 CAL/FIBER,) LIQD Place 237 mLs into feeding tube 6 (six) times daily. 1000 mL 5  . oxyCODONE (ROXICODONE) 5 MG/5ML solution Place 5 mLs (5 mg total) into feeding tube every 6 (six) hours as needed for severe pain. 100 mL 0   No current facility-administered medications for this visit.   Facility-Administered Medications Ordered in Other Visits  Medication Dose Route Frequency Provider Last Rate Last Dose  .  sodium chloride 0.9 % injection 10 mL  10 mL Intracatheter PRN Cammie Sickle, MD   10 mL at 03/04/15 0933      .  PHYSICAL EXAMINATION: ECOG PERFORMANCE STATUS: 1 - Symptomatic but completely ambulatory  Filed Vitals:   04/15/15 1419  BP: 119/77  Pulse: 64  Temp: 97.2 F (36.2 C)  Resp: 18   Filed Weights   04/15/15 1419  Weight: 137 lb 2 oz (62.2 kg)    GENERAL: Thin built; moderately nourished. Alert, no distress and comfortable.   Accompanied by his wife EYES: no pallor or icterus OROPHARYNX: no thrush or ulceration; good dentition  NECK: supple, no masses felt LYMPH:  ~3 CM palpable lymphadenopathy in the cervical on left side [slight increase] LUNGS: clear to auscultation and  No wheeze or crackles HEART/CVS: regular rate & rhythm and no murmurs; No lower extremity edema ABDOMEN: abdomen soft, non-tender and normal bowel sounds; PEG tube in place Musculoskeletal:no cyanosis of digits and no clubbing  PSYCH: alert & oriented x 3 with fluent speech NEURO: no focal motor/sensory deficits SKIN:  no rashes or significant lesions  LABORATORY DATA:  I have reviewed the data as listed Lab Results  Component Value Date   WBC 3.1* 04/15/2015   HGB 9.8* 04/15/2015   HCT 29.9* 04/15/2015   MCV 92.5 04/15/2015   PLT 115* 04/15/2015    Recent Labs  03/11/15 0837 03/18/15 0949 03/25/15 1003 04/15/15 1326  NA 132* 132* 132* 135  K 4.4 4.4 4.7 4.8  CL 103 104 103 104  CO2 '22 22 23 25  ' GLUCOSE 145* 94 90 102*  BUN 33* 32* 25* 27*  CREATININE 1.55* 1.53* 1.46* 1.82*  CALCIUM 8.6* 8.4* 8.4* 8.8*  GFRNONAA 46* 47* 50* 38*  GFRAA 54* 55* 58* 44*  PROT 7.4  --  6.8 7.8  ALBUMIN 3.9  --  3.7 4.2  AST 19  --  14* 14*  ALT 9*  --  8* 7*  ALKPHOS 58  --  58 60  BILITOT 0.6  --  0.4 0.4    RADIOGRAPHIC STUDIES: I have personally reviewed the radiological images as listed and agreed with the findings in the report. Nm Pet Image Restag (ps) Skull Base To  Thigh  04/07/2015  CLINICAL DATA:  Subsequent treatment strategy for esophageal cancer. Restaging examination. EXAM: NUCLEAR MEDICINE PET SKULL BASE TO THIGH TECHNIQUE: 12.6 mCi F-18 FDG was injected intravenously. Full-ring PET imaging was performed from the skull base to thigh after the radiotracer. CT data was obtained and used for attenuation correction and anatomic localization. FASTING BLOOD GLUCOSE:  Value: 78 mg/dl COMPARISON:  PET-CT 01/23/2015. FINDINGS: NECK Multiple hypermetabolic left cervical and left supraclavicular lymph nodes. The largest of these is a left level IIa lymph node which is difficult to discretely measure on today's noncontrast images, but is markedly hypermetabolic (SUVmax = 69.6). The largest left supraclavicular lymph node measures 1 cm in short axis (image 90 of series 3) and is hypermetabolic (SUVmax = 5.7). CHEST Hypermetabolism (SUVmax = 9.0)  in the midesophagus at the level of the carina where there is some focal soft tissue fullness, corresponding the patient's known esophageal neoplasm. 6 mm short axis left paraesophageal lymph node immediately anterior to the aorta (image 138 of series 3) is mildly hypermetabolic (SUVmax = 4.2), as are some left hilar lymph nodes (SUVmax = 3.6-3.9), which do not appear enlarged and are difficult to measure on today's noncontrast CT examination. No suspicious pulmonary nodules on the CT scan. Focal architectural distortion in the apex of the left lung is unchanged, most compatible with an area of post infectious or potentially treatment related scarring. No acute consolidative airspace disease. No pleural effusions. Right-sided internal jugular single-lumen porta cath with tip terminating in the right atrium. Heart size is normal. There is no significant pericardial fluid, thickening or pericardial calcification. There is atherosclerosis of the thoracic aorta, the great vessels of the mediastinum and the coronary arteries, including calcified  atherosclerotic plaque in the left main, left anterior descending, left circumflex and right coronary arteries. ABDOMEN/PELVIS No abnormal hypermetabolic activity within the liver, pancreas, adrenal glands, or spleen. No hypermetabolic lymph nodes in the abdomen or pelvis. Calcified gallstones in the gallbladder. Numerous calcified granulomas throughout the spleen. Percutaneous gastrostomy tube in position appears properly located. Extensive atherosclerosis throughout the abdominal and pelvic vasculature, without definite aneurysm. Postoperative changes of urinary diversion are again noted. SKELETON No focal hypermetabolic activity to suggest skeletal metastasis. IMPRESSION: 1. Persistent hypermetabolic mid esophageal mass with mediastinal, left hilar, left supraclavicular and left cervical lymphadenopathy, as detailed above. 2. Atherosclerosis, including left main and 3 vessel coronary artery disease. Please note that although the presence of coronary artery calcium documents the presence of coronary artery disease, the severity of this disease and any potential stenosis cannot be assessed on this non-gated CT examination. Assessment for potential risk factor modification, dietary therapy or pharmacologic therapy may be warranted, if clinically indicated. 3. Cholelithiasis without evidence of acute cholecystitis at this time. 4. Additional incidental findings, similar prior studies, as above. Electronically Signed   By: Vinnie Langton M.D.   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  # Clinically patient had a good improvement overall; significant weight gain/improvement in swallowing. Given the continued oligometastatic disease in the left neck- I recommend irradiation  of the neck along with carboplatin. I spoke to Dr. Donella Stade; he plans  radiation over 2 weeks.  # Plan to start 5-FU continuous infusion single agent approximately 2 weeks after his radiation/carboplatin treatment. I again discussed the rationale for systemic therapy; reminded the patient and family that this is incurable/and all the treatmentare palliative.  # Chronic kidney disease- base and creatinine around 1.6; today creatinine is 1.8. Recommend increase by mouth fluids.  # Acute hypersensitivity reaction to Taxol-discontinued.   # Regards to anemia- appears to be more from anemia of chronic kidney disease-hemoglobin is 9.8; platelets 1:15; white count 3.1 ANC normal.  # Pain secondary to malignancy/ PEG tube- continue oxycodone.  # 40 minutes face-to-face with the patient discussing the above plan of care; more than 50% of time spent on prognosis/ natural history; counseling and coordination.       Cammie Sickle, MD 04/15/2015 2:44 PM

## 2015-04-15 NOTE — Progress Notes (Signed)
Patient is here for follow-up of esophageal cancer. Patient states that he has been feeling pretty good since his last visit. His appetite has been about the same. He denies any pain or other new complaints.

## 2015-04-17 ENCOUNTER — Ambulatory Visit: Payer: Self-pay | Admitting: Radiation Oncology

## 2015-04-20 ENCOUNTER — Ambulatory Visit: Payer: Self-pay | Admitting: Radiation Oncology

## 2015-04-21 ENCOUNTER — Encounter: Payer: Self-pay | Admitting: Radiation Oncology

## 2015-04-21 ENCOUNTER — Ambulatory Visit
Admission: RE | Admit: 2015-04-21 | Discharge: 2015-04-21 | Disposition: A | Discharge status: Home / Self Care | Payer: Self-pay | Source: Ambulatory Visit | Attending: Radiation Oncology | Admitting: Radiation Oncology

## 2015-04-21 VITALS — BP 117/75 | HR 81 | Temp 98.7°F | Resp 20 | Wt 137.9 lb

## 2015-04-21 DIAGNOSIS — Z931 Gastrostomy status: Secondary | ICD-10-CM | POA: Insufficient documentation

## 2015-04-21 DIAGNOSIS — Z510 Encounter for antineoplastic radiation therapy: Secondary | ICD-10-CM | POA: Insufficient documentation

## 2015-04-21 DIAGNOSIS — C154 Malignant neoplasm of middle third of esophagus: Secondary | ICD-10-CM | POA: Insufficient documentation

## 2015-04-21 DIAGNOSIS — C770 Secondary and unspecified malignant neoplasm of lymph nodes of head, face and neck: Secondary | ICD-10-CM | POA: Insufficient documentation

## 2015-04-21 DIAGNOSIS — C7951 Secondary malignant neoplasm of bone: Secondary | ICD-10-CM | POA: Insufficient documentation

## 2015-04-21 NOTE — Progress Notes (Signed)
Radiation Oncology Follow up Note  Name: Jeffrey [name]   Date:   04/21/2015 MRN:  [id number] DOB: April 23, 1950    This 65 y.o. male presents to the clinic today for all patient new area in patient previously treated for stage IV midesophagus squamous cell carcinoma for evaluation of left cervical nodal progression.  REFERRING PROVIDER: Adin Hector, MD  HPI: Patient is a 65 year old male recently completed palliative radiation therapy to his esophagus for stage IV midesophagus squamous cell carcinoma. He had a repeat PET CT scan which shows. Persistent hypermetabolic activity in the midesophagus although it appears to be markedly improved. Patient is swallowing solid foods now which was he was unable to do prior. He still has nutritional intake through his PEG tube. He has developed progressive disease in his left mid cervical chain also PET/CT scan shows hypermetabolic disease in his left supraclavicular fossa. I been asked to evaluate the patient for possible palliative radiation therapy to these areas.  COMPLICATIONS OF TREATMENT: none  FOLLOW UP COMPLIANCE: keeps appointments   PHYSICAL EXAM:  BP 117/75 mmHg  Pulse 81  Temp(Src) 98.7 F (37.1 C)  Resp 20  Wt 137 lb 14.4 oz (62.55 kg) Thin slightly connected male in NAD. Patient has proximal be 3 cm mid esophageal mass protruding although no ulceration the skin is noted. He also has some shoddy adenopathy in his left supraclavicular fossa. Well-developed well-nourished patient in NAD. HEENT reveals PERLA, EOMI, discs not visualized.  Oral cavity is clear. No oral mucosal lesions are identified. Neck is clear without evidence of cervical or supraclavicular adenopathy. Lungs are clear to A&P. Cardiac examination is essentially unremarkable with regular rate and rhythm without murmur rub or thrill. Abdomen is benign with no organomegaly or masses noted. Motor sensory and DTR levels are equal and symmetric in the upper and lower  extremities. Cranial nerves II through XII are grossly intact. Proprioception is intact. No peripheral adenopathy or edema is identified. No motor or sensory levels are noted. Crude visual fields are within normal range.  RADIOLOGY RESULTS: Repeat PET CT scan is reviewed and compared to his prior studies  PLAN: At this time I to go ahead with palliative radiation therapy to his left large cervical node as well as supraclavicular fossa. Would plan on delivering 3000 cGy in 10 fractions and evaluate for response. Will try to spare his esophagus is much is possible to prevent any further problems with dysphagia. Risks and benefits of treatment including skin reaction fatigue alteration of blood counts possibility of radiation esophagitis all were discussed in detail with the patient. He seems to comprehend my treatment plan well. I have set up and ordered CT simulation for later this week.  I would like to take this opportunity for allowing me to participate in the care of your patient.Armstead Peaks., MD

## 2015-04-22 ENCOUNTER — Other Ambulatory Visit: Payer: Self-pay | Admitting: Internal Medicine

## 2015-04-22 ENCOUNTER — Inpatient Hospital Stay: Payer: Self-pay

## 2015-04-22 VITALS — BP 98/72 | HR 65 | Temp 96.7°F | Resp 20

## 2015-04-22 DIAGNOSIS — C159 Malignant neoplasm of esophagus, unspecified: Secondary | ICD-10-CM

## 2015-04-22 DIAGNOSIS — C154 Malignant neoplasm of middle third of esophagus: Secondary | ICD-10-CM

## 2015-04-22 LAB — CBC WITH DIFFERENTIAL/PLATELET
Basophils Absolute: 0 10*3/uL (ref 0–0.1)
Basophils Relative: 0 %
Eosinophils Absolute: 0 10*3/uL (ref 0–0.7)
Eosinophils Relative: 1 %
HEMATOCRIT: 30.9 % — AB (ref 40.0–52.0)
HEMOGLOBIN: 10.2 g/dL — AB (ref 13.0–18.0)
LYMPHS ABS: 0.7 10*3/uL — AB (ref 1.0–3.6)
Lymphocytes Relative: 17 %
MCH: 30.7 pg (ref 26.0–34.0)
MCHC: 33 g/dL (ref 32.0–36.0)
MCV: 93 fL (ref 80.0–100.0)
MONOS PCT: 15 %
Monocytes Absolute: 0.6 10*3/uL (ref 0.2–1.0)
NEUTROS ABS: 2.6 10*3/uL (ref 1.4–6.5)
NEUTROS PCT: 67 %
Platelets: 185 10*3/uL (ref 150–440)
RBC: 3.32 MIL/uL — ABNORMAL LOW (ref 4.40–5.90)
RDW: 24.1 % — ABNORMAL HIGH (ref 11.5–14.5)
WBC: 4 10*3/uL (ref 3.8–10.6)

## 2015-04-22 LAB — COMPREHENSIVE METABOLIC PANEL
ALK PHOS: 63 U/L (ref 38–126)
ALT: 7 U/L — ABNORMAL LOW (ref 17–63)
ANION GAP: 4 — AB (ref 5–15)
AST: 13 U/L — ABNORMAL LOW (ref 15–41)
Albumin: 3.9 g/dL (ref 3.5–5.0)
BILIRUBIN TOTAL: 0.6 mg/dL (ref 0.3–1.2)
BUN: 32 mg/dL — ABNORMAL HIGH (ref 6–20)
CALCIUM: 8.7 mg/dL — AB (ref 8.9–10.3)
CO2: 27 mmol/L (ref 22–32)
Chloride: 108 mmol/L (ref 101–111)
Creatinine, Ser: 2.1 mg/dL — ABNORMAL HIGH (ref 0.61–1.24)
GFR, EST AFRICAN AMERICAN: 37 mL/min — AB (ref >60)
GFR, EST NON AFRICAN AMERICAN: 32 mL/min — AB (ref >60)
GLUCOSE: 109 mg/dL — AB (ref 65–99)
Potassium: 4.4 mmol/L (ref 3.5–5.1)
Sodium: 139 mmol/L (ref 135–145)
TOTAL PROTEIN: 7.5 g/dL (ref 6.5–8.1)

## 2015-04-22 MED ORDER — SODIUM CHLORIDE 0.9 % IV SOLN
Freq: Once | INTRAVENOUS | Status: AC
  Administered 2015-04-22: 11:00:00 via INTRAVENOUS
  Filled 2015-04-22: qty 8

## 2015-04-22 MED ORDER — FAMOTIDINE IN NACL 20-0.9 MG/50ML-% IV SOLN
20.0000 mg | Freq: Once | INTRAVENOUS | Status: AC
  Administered 2015-04-22: 20 mg via INTRAVENOUS
  Filled 2015-04-22: qty 50

## 2015-04-22 MED ORDER — SODIUM CHLORIDE 0.9 % IV SOLN
Freq: Once | INTRAVENOUS | Status: AC
  Administered 2015-04-22: 11:00:00 via INTRAVENOUS
  Filled 2015-04-22: qty 1000

## 2015-04-22 MED ORDER — SODIUM CHLORIDE 0.9 % IV SOLN
110.0000 mg | Freq: Once | INTRAVENOUS | Status: AC
  Administered 2015-04-22: 110 mg via INTRAVENOUS
  Filled 2015-04-22: qty 11

## 2015-04-22 MED ORDER — HEPARIN SOD (PORK) LOCK FLUSH 100 UNIT/ML IV SOLN
500.0000 [IU] | Freq: Once | INTRAVENOUS | Status: AC | PRN
  Administered 2015-04-22: 500 [IU]
  Filled 2015-04-22: qty 5

## 2015-04-22 MED ORDER — OXYCODONE HCL 5 MG/5ML PO SOLN: 5.0000 mg | Freq: Three times a day (TID) | ORAL | Status: DC | PRN

## 2015-04-22 MED ORDER — SODIUM CHLORIDE 0.9 % IJ SOLN
10.0000 mL | INTRAMUSCULAR | Status: DC | PRN
  Administered 2015-04-22 (×2): 10 mL
  Filled 2015-04-22: qty 10

## 2015-04-22 MED ORDER — DIPHENHYDRAMINE HCL 50 MG/ML IJ SOLN
50.0000 mg | Freq: Once | INTRAMUSCULAR | Status: AC
  Administered 2015-04-22: 25 mg via INTRAVENOUS
  Filled 2015-04-22: qty 1

## 2015-04-23 ENCOUNTER — Ambulatory Visit: Payer: Self-pay | Admitting: Internal Medicine

## 2015-04-23 ENCOUNTER — Ambulatory Visit
Admission: RE | Admit: 2015-04-23 | Discharge: 2015-04-23 | Disposition: A | Discharge status: Home / Self Care | Payer: Self-pay | Source: Ambulatory Visit | Attending: Radiation Oncology | Admitting: Radiation Oncology

## 2015-04-23 ENCOUNTER — Other Ambulatory Visit: Payer: Self-pay

## 2015-04-29 ENCOUNTER — Inpatient Hospital Stay: Payer: Self-pay

## 2015-04-29 ENCOUNTER — Inpatient Hospital Stay (HOSPITAL_BASED_OUTPATIENT_CLINIC_OR_DEPARTMENT_OTHER): Payer: Self-pay | Admitting: Internal Medicine

## 2015-04-29 ENCOUNTER — Encounter: Payer: Self-pay | Admitting: Internal Medicine

## 2015-04-29 VITALS — BP 119/73 | HR 66 | Temp 97.9°F | Resp 18 | Ht 67.0 in | Wt 135.1 lb

## 2015-04-29 DIAGNOSIS — I2510 Atherosclerotic heart disease of native coronary artery without angina pectoris: Secondary | ICD-10-CM

## 2015-04-29 DIAGNOSIS — K8020 Calculus of gallbladder without cholecystitis without obstruction: Secondary | ICD-10-CM

## 2015-04-29 DIAGNOSIS — C154 Malignant neoplasm of middle third of esophagus: Secondary | ICD-10-CM

## 2015-04-29 DIAGNOSIS — Z931 Gastrostomy status: Secondary | ICD-10-CM

## 2015-04-29 DIAGNOSIS — Z8551 Personal history of malignant neoplasm of bladder: Secondary | ICD-10-CM

## 2015-04-29 DIAGNOSIS — C770 Secondary and unspecified malignant neoplasm of lymph nodes of head, face and neck: Secondary | ICD-10-CM

## 2015-04-29 DIAGNOSIS — Z79899 Other long term (current) drug therapy: Secondary | ICD-10-CM

## 2015-04-29 DIAGNOSIS — I129 Hypertensive chronic kidney disease with stage 1 through stage 4 chronic kidney disease, or unspecified chronic kidney disease: Secondary | ICD-10-CM

## 2015-04-29 DIAGNOSIS — N183 Chronic kidney disease, stage 3 (moderate): Secondary | ICD-10-CM

## 2015-04-29 DIAGNOSIS — F17210 Nicotine dependence, cigarettes, uncomplicated: Secondary | ICD-10-CM

## 2015-04-29 DIAGNOSIS — E46 Unspecified protein-calorie malnutrition: Secondary | ICD-10-CM

## 2015-04-29 DIAGNOSIS — J439 Emphysema, unspecified: Secondary | ICD-10-CM

## 2015-04-29 DIAGNOSIS — D649 Anemia, unspecified: Secondary | ICD-10-CM

## 2015-04-29 DIAGNOSIS — Z906 Acquired absence of other parts of urinary tract: Secondary | ICD-10-CM

## 2015-04-29 DIAGNOSIS — G893 Neoplasm related pain (acute) (chronic): Secondary | ICD-10-CM

## 2015-04-29 LAB — CBC WITH DIFFERENTIAL/PLATELET
BASOS ABS: 0 10*3/uL (ref 0–0.1)
Basophils Relative: 1 %
Eosinophils Absolute: 0 10*3/uL (ref 0–0.7)
Eosinophils Relative: 1 %
HEMATOCRIT: 30.3 % — AB (ref 40.0–52.0)
Hemoglobin: 10.4 g/dL — ABNORMAL LOW (ref 13.0–18.0)
LYMPHS PCT: 13 %
Lymphs Abs: 0.7 10*3/uL — ABNORMAL LOW (ref 1.0–3.6)
MCH: 31.9 pg (ref 26.0–34.0)
MCHC: 34.2 g/dL (ref 32.0–36.0)
MCV: 93.1 fL (ref 80.0–100.0)
Monocytes Absolute: 0.7 10*3/uL (ref 0.2–1.0)
Monocytes Relative: 13 %
NEUTROS ABS: 3.8 10*3/uL (ref 1.4–6.5)
Neutrophils Relative %: 72 %
Platelets: 188 10*3/uL (ref 150–440)
RBC: 3.25 MIL/uL — AB (ref 4.40–5.90)
RDW: 23.7 % — ABNORMAL HIGH (ref 11.5–14.5)
WBC: 5.3 10*3/uL (ref 3.8–10.6)

## 2015-04-29 LAB — COMPREHENSIVE METABOLIC PANEL
ALK PHOS: 56 U/L (ref 38–126)
ALT: 8 U/L — AB (ref 17–63)
AST: 14 U/L — AB (ref 15–41)
Albumin: 4 g/dL (ref 3.5–5.0)
Anion gap: 5 (ref 5–15)
BILIRUBIN TOTAL: 0.6 mg/dL (ref 0.3–1.2)
BUN: 47 mg/dL — AB (ref 6–20)
CO2: 24 mmol/L (ref 22–32)
CREATININE: 2 mg/dL — AB (ref 0.61–1.24)
Calcium: 8.7 mg/dL — ABNORMAL LOW (ref 8.9–10.3)
Chloride: 107 mmol/L (ref 101–111)
GFR calc Af Amer: 39 mL/min — ABNORMAL LOW (ref >60)
GFR, EST NON AFRICAN AMERICAN: 34 mL/min — AB (ref >60)
Glucose, Bld: 143 mg/dL — ABNORMAL HIGH (ref 65–99)
Potassium: 4.4 mmol/L (ref 3.5–5.1)
Sodium: 136 mmol/L (ref 135–145)
TOTAL PROTEIN: 7.7 g/dL (ref 6.5–8.1)

## 2015-04-29 MED ORDER — SODIUM CHLORIDE 0.9% FLUSH
10.0000 mL | INTRAVENOUS | Status: DC | PRN
Start: 2015-04-29 — End: 2015-04-29
  Administered 2015-04-29: 10 mL via INTRAVENOUS
  Filled 2015-04-29: qty 10

## 2015-04-29 MED ORDER — SODIUM CHLORIDE 0.9 % IV SOLN
Freq: Once | INTRAVENOUS | Status: AC
  Administered 2015-04-29: 10:00:00 via INTRAVENOUS
  Filled 2015-04-29: qty 1000

## 2015-04-29 MED ORDER — HEPARIN SOD (PORK) LOCK FLUSH 100 UNIT/ML IV SOLN
500.0000 [IU] | Freq: Once | INTRAVENOUS | Status: AC
  Filled 2015-04-29: qty 5

## 2015-04-29 MED ORDER — HEPARIN SOD (PORK) LOCK FLUSH 100 UNIT/ML IV SOLN
500.0000 [IU] | Freq: Once | INTRAVENOUS | Status: AC | PRN
  Administered 2015-04-29: 500 [IU]

## 2015-04-29 MED ORDER — CARBOPLATIN CHEMO INJECTION 450 MG/45ML
113.0000 mg | Freq: Once | INTRAVENOUS | Status: AC
  Administered 2015-04-29: 110 mg via INTRAVENOUS
  Filled 2015-04-29: qty 11

## 2015-04-29 MED ORDER — SODIUM CHLORIDE 0.9 % IV SOLN
Freq: Once | INTRAVENOUS | Status: AC
  Administered 2015-04-29: 11:00:00 via INTRAVENOUS
  Filled 2015-04-29: qty 8

## 2015-04-29 MED ORDER — FAMOTIDINE IN NACL 20-0.9 MG/50ML-% IV SOLN
20.0000 mg | Freq: Once | INTRAVENOUS | Status: AC
  Administered 2015-04-29: 20 mg via INTRAVENOUS
  Filled 2015-04-29: qty 50

## 2015-04-29 MED ORDER — DIPHENHYDRAMINE HCL 50 MG/ML IJ SOLN
50.0000 mg | Freq: Once | INTRAMUSCULAR | Status: AC
  Administered 2015-04-29: 50 mg via INTRAVENOUS
  Filled 2015-04-29: qty 1

## 2015-04-29 NOTE — Progress Notes (Signed)
.[place] NOTE  Patient Care Team: Adin Hector, MD as PCP - General (Internal Medicine) Clent Jacks, RN as Registered Nurse  CHIEF COMPLAINTS/PURPOSE OF CONSULTATION:   # OCT 2016- SQUAMOUS CELL CA of middle esophagus; Stage IV [EGD bx]; PET- Esopahgeal uptake & 2 CM Left Cervical LN-MET SQUAM; Carbo-taxol [Nov 9th]with RT [Nov 14th];  Cont Weekly Carbo- RT; JAN 3rd- 2017- stable esophageal uptake/slight increase neck adenopathy- recommend local radiation to the neck with carboplatin  # TAXOL HELD sec to ACUTE INFUSION REACTIONS  # malnutrition s/p PEG tube  # CKD [stage III]; Hx of non-invasive bladder ca [s/p cystectomy with neo-bladder; [facility]]  HISTORY OF PRESENTING ILLNESS:  Jeffrey [name] 65 y.o. 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  Patient continues to be on weekly carbo platinum AUC 2; patient plans start radiation next week.  Patient's appetite is improving. Denies any nausea vomiting. Weight around 135 pounds today. His swallowing is improved; currently stable; he does still continue to have some difficulty swallowing. PEG tube site-on oxycodone liquid.   His wife has noticed slightly worsening swelling of his left neck lymph node.  ROS: A complete 10 point review of system is done which is negative except mentioned above in history of present illness  MEDICAL HISTORY:  Past Medical History  Diagnosis Date  . Hypertension   . Bladder cancer ([place])   . Emphysema of lung ([place])   . Lung cancer ([place])   . Chronic kidney disease     SURGICAL HISTORY: Past Surgical History  Procedure Laterality Date  . Bladder removal    . Esophagogastroduodenoscopy (egd) with propofol N/A 01/21/2015    Procedure: ESOPHAGOGASTRODUODENOSCOPY (EGD) WITH PROPOFOL-looking in the  esophagus stomach and upper small intestine to evaluate and treat;  Surgeon: [name], MD;  Location: [facility] ENDOSCOPY;  Service: Endoscopy;  Laterality: N/A;  . Gastrostomy w/ feeding tube Left     placed end of october 2016  . Portacath placement Right 02/04/2015    Procedure: INSERTION PORT-A-CATH;  Surgeon: [name], MD;  Location: [facility] ORS;  Service: General;  Laterality: Right;  . Lymph node biopsy N/A 02/04/2015    Procedure: LYMPH NODE BIOPSY;  Surgeon: [name], MD;  Location: [facility] ORS;  Service: General;  Laterality: N/A;    SOCIAL HISTORY: Social History   Social History  . Marital Status: Married    Spouse Name: N/A  . Number of Children: N/A  . Years of Education: N/A   Occupational History  . Not on file.   Social History Main Topics  . Smoking status: Current Every Day Smoker -- 0.50 packs/day    Types: Cigarettes  . Smokeless tobacco: Current User    Types: Chew  . Alcohol Use: No  . Drug Use: No  . Sexual Activity: Not on file   Other Topics Concern  . Not on file   Social History Narrative    FAMILY HISTORY: Family History  Problem Relation Age of Onset  . Hypertension Other     ALLERGIES:  is allergic to aspirin and taxol.  MEDICATIONS:  Current Outpatient Prescriptions  Medication Sig Dispense Refill  . lidocaine-prilocaine (EMLA) cream Apply cream 1 hour before chemotherapy treatment, place a small amount of saran wrap over the cream to protect your clothing 30 g 1  . neomycin-bacitracin-polymyxin (NEOSPORIN) OINT Apply 1  application topically daily. 30 g 1  . Nutritional Supplements (FEEDING SUPPLEMENT, JEVITY 1.5 CAL/FIBER,) LIQD Place 237 mLs into feeding tube 6 (six) times daily. 1000 mL 5  . oxyCODONE (ROXICODONE) 5 MG/5ML solution Place 5 mLs (5 mg total) into feeding tube every 8 (eight) hours as needed for severe pain. 100 mL 0   No current facility-administered medications for this visit.   Facility-Administered  Medications Ordered in Other Visits  Medication Dose Route Frequency Provider Last Rate Last Dose  . heparin lock flush 100 unit/mL  500 Units Intravenous Once Cammie Sickle, MD      . sodium chloride 0.9 % injection 10 mL  10 mL Intracatheter PRN Cammie Sickle, MD   10 mL at 03/04/15 0933  . sodium chloride flush (NS) 0.9 % injection 10 mL  10 mL Intravenous PRN Cammie Sickle, MD   10 mL at 04/29/15 0835      .  PHYSICAL EXAMINATION: ECOG PERFORMANCE STATUS: 1 - Symptomatic but completely ambulatory  Filed Vitals:   04/29/15 0851  BP: 119/73  Pulse: 66  Temp: 97.9 F (36.6 C)  Resp: 18   Filed Weights   04/29/15 0851  Weight: 135 lb 2.3 oz (61.3 kg)    GENERAL: Thin built; moderately nourished. Alert, no distress and comfortable.   Accompanied by his wife EYES: no pallor or icterus OROPHARYNX: no thrush or ulceration; good dentition  NECK: supple, no masses felt LYMPH:  ~3.5 CM palpable lymphadenopathy in the cervical on left side [slight increase] LUNGS: clear to auscultation and  No wheeze or crackles HEART/CVS: regular rate & rhythm and no murmurs; No lower extremity edema ABDOMEN: abdomen soft, non-tender and normal bowel sounds; PEG tube in place Musculoskeletal:no cyanosis of digits and no clubbing  PSYCH: alert & oriented x 3 with fluent speech NEURO: no focal motor/sensory deficits SKIN:  no rashes or significant lesions  LABORATORY DATA:  I have reviewed the data as listed Lab Results  Component Value Date   WBC 5.3 04/29/2015   HGB 10.4* 04/29/2015   HCT 30.3* 04/29/2015   MCV 93.1 04/29/2015   PLT 188 04/29/2015    Recent Labs  04/15/15 1326 04/22/15 0940 04/29/15 0835  NA 135 139 136  K 4.8 4.4 4.4  CL 104 108 107  CO2 _0 GLUCOSE 102* 109* 143*  BUN 27* 32* 47*  CREATININE 1.82* 2.10* 2.00*  CALCIUM 8.8* 8.7* 8.7*  GFRNONAA 38* 32* 34*  GFRAA 44* 37* 39*  PROT 7.8 7.5 7.7  ALBUMIN 4.2 3.9 4.0  AST 14* 13* 14*   ALT 7* 7* 8*  ALKPHOS 60 63 56  BILITOT 0.4 0.6 0.6    RADIOGRAPHIC STUDIES: I have personally reviewed the radiological images as listed and agreed with the findings in the report. Nm Pet Image Restag (ps) Skull Base To Thigh  04/07/2015  CLINICAL DATA:  Subsequent treatment strategy for esophageal cancer. Restaging examination. EXAM: NUCLEAR MEDICINE PET SKULL BASE TO THIGH TECHNIQUE: 12.6 mCi F-18 FDG was injected intravenously. Full-ring PET imaging was performed from the skull base to thigh after the radiotracer. CT data was obtained and used for attenuation correction and anatomic localization. FASTING BLOOD GLUCOSE:  Value: 78 mg/dl COMPARISON:  PET-CT 01/23/2015. FINDINGS: NECK Multiple hypermetabolic left cervical and left supraclavicular lymph nodes. The largest of these is a left level IIa lymph node which is difficult to discretely measure on today's noncontrast images, but is markedly hypermetabolic (SUVmax = 50.5). The  largest left supraclavicular lymph node measures 1 cm in short axis (image 90 of series 3) and is hypermetabolic (SUVmax = 5.7). CHEST Hypermetabolism (SUVmax = 9.0) in the midesophagus at the level of the carina where there is some focal soft tissue fullness, corresponding the patient's known esophageal neoplasm. 6 mm short axis left paraesophageal lymph node immediately anterior to the aorta (image 138 of series 3) is mildly hypermetabolic (SUVmax = 4.2), as are some left hilar lymph nodes (SUVmax = 3.6-3.9), which do not appear enlarged and are difficult to measure on today's noncontrast CT examination. No suspicious pulmonary nodules on the CT scan. Focal architectural distortion in the apex of the left lung is unchanged, most compatible with an area of post infectious or potentially treatment related scarring. No acute consolidative airspace disease. No pleural effusions. Right-sided internal jugular single-lumen porta cath with tip terminating in the right atrium. Heart  size is normal. There is no significant pericardial fluid, thickening or pericardial calcification. There is atherosclerosis of the thoracic aorta, the great vessels of the mediastinum and the coronary arteries, including calcified atherosclerotic plaque in the left main, left anterior descending, left circumflex and right coronary arteries. ABDOMEN/PELVIS No abnormal hypermetabolic activity within the liver, pancreas, adrenal glands, or spleen. No hypermetabolic lymph nodes in the abdomen or pelvis. Calcified gallstones in the gallbladder. Numerous calcified granulomas throughout the spleen. Percutaneous gastrostomy tube in position appears properly located. Extensive atherosclerosis throughout the abdominal and pelvic vasculature, without definite aneurysm. Postoperative changes of urinary diversion are again noted. SKELETON No focal hypermetabolic activity to suggest skeletal metastasis. IMPRESSION: 1. Persistent hypermetabolic mid esophageal mass with mediastinal, left hilar, left supraclavicular and left cervical lymphadenopathy, as detailed above. 2. Atherosclerosis, including left main and 3 vessel coronary artery disease. Please note that although the presence of coronary artery calcium documents the presence of coronary artery disease, the severity of this disease and any potential stenosis cannot be assessed on this non-gated CT examination. Assessment for potential risk factor modification, dietary therapy or pharmacologic therapy may be warranted, if clinically indicated. 3. Cholelithiasis without evidence of acute cholecystitis at this time. 4. Additional incidental findings, similar prior studies, as above. Electronically Signed   By: Vinnie Langton M.D.   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   # Oligometastatic disease in the left neck- I recommend irradiation of the neck along with carboplatin- for now continued weekly carboplatin. Patient plans to start radiation Jan 30th.  # Plan to start 5-FU continuous infusion single agent approximately 2 weeks after his radiation/carboplatin treatment. I again discussed the rationale for systemic therapy; reminded the patient and family that this is incurable/and all the treatmentare palliative.  # Chronic kidney disease- base and creatinine around 1.6; today creatinine is ~2.0;    # Regards to anemia- appears to be more from anemia of chronic kidney disease-hemoglobin is 10.   # Pain secondary to malignancy/ PEG tube- continue oxycodone.  # I see the patient back in 2 weeks; patient continue weekly carboplatin/weekly labs.   # 25 minutes face-to-face with the patient discussing the above plan of care; more than 50% of time spent on prognosis/ natural history; counseling and coordination.       Cammie Sickle, MD 04/29/2015 9:08 AM

## 2015-04-30 ENCOUNTER — Ambulatory Visit
Admission: RE | Admit: 2015-04-30 | Discharge: 2015-04-30 | Disposition: A | Discharge status: Home / Self Care | Payer: Self-pay | Source: Ambulatory Visit | Attending: Radiation Oncology | Admitting: Radiation Oncology

## 2015-05-04 ENCOUNTER — Ambulatory Visit
Admission: RE | Admit: 2015-05-04 | Discharge: 2015-05-04 | Disposition: A | Discharge status: Home / Self Care | Payer: Self-pay | Source: Ambulatory Visit | Attending: Radiation Oncology | Admitting: Radiation Oncology

## 2015-05-05 ENCOUNTER — Ambulatory Visit
Admission: RE | Admit: 2015-05-05 | Discharge: 2015-05-05 | Disposition: A | Discharge status: Home / Self Care | Payer: Self-pay | Source: Ambulatory Visit | Attending: Radiation Oncology | Admitting: Radiation Oncology

## 2015-05-06 ENCOUNTER — Inpatient Hospital Stay: Payer: Self-pay

## 2015-05-06 ENCOUNTER — Other Ambulatory Visit: Payer: Self-pay

## 2015-05-06 ENCOUNTER — Ambulatory Visit
Admission: RE | Admit: 2015-05-06 | Discharge: 2015-05-06 | Disposition: A | Discharge status: Home / Self Care | Payer: Self-pay | Source: Ambulatory Visit | Attending: Radiation Oncology | Admitting: Radiation Oncology

## 2015-05-06 ENCOUNTER — Inpatient Hospital Stay: Payer: Self-pay | Attending: Internal Medicine

## 2015-05-06 ENCOUNTER — Ambulatory Visit: Payer: Self-pay | Admitting: Radiation Oncology

## 2015-05-06 DIAGNOSIS — Z931 Gastrostomy status: Secondary | ICD-10-CM | POA: Insufficient documentation

## 2015-05-06 DIAGNOSIS — Z5111 Encounter for antineoplastic chemotherapy: Secondary | ICD-10-CM | POA: Insufficient documentation

## 2015-05-06 DIAGNOSIS — G893 Neoplasm related pain (acute) (chronic): Secondary | ICD-10-CM | POA: Insufficient documentation

## 2015-05-06 DIAGNOSIS — Z8551 Personal history of malignant neoplasm of bladder: Secondary | ICD-10-CM | POA: Insufficient documentation

## 2015-05-06 DIAGNOSIS — R1310 Dysphagia, unspecified: Secondary | ICD-10-CM | POA: Insufficient documentation

## 2015-05-06 DIAGNOSIS — Z85118 Personal history of other malignant neoplasm of bronchus and lung: Secondary | ICD-10-CM | POA: Insufficient documentation

## 2015-05-06 DIAGNOSIS — J439 Emphysema, unspecified: Secondary | ICD-10-CM | POA: Insufficient documentation

## 2015-05-06 DIAGNOSIS — F17210 Nicotine dependence, cigarettes, uncomplicated: Secondary | ICD-10-CM | POA: Insufficient documentation

## 2015-05-06 DIAGNOSIS — N183 Chronic kidney disease, stage 3 (moderate): Secondary | ICD-10-CM | POA: Insufficient documentation

## 2015-05-06 DIAGNOSIS — D649 Anemia, unspecified: Secondary | ICD-10-CM | POA: Insufficient documentation

## 2015-05-06 DIAGNOSIS — C154 Malignant neoplasm of middle third of esophagus: Secondary | ICD-10-CM | POA: Insufficient documentation

## 2015-05-06 DIAGNOSIS — E46 Unspecified protein-calorie malnutrition: Secondary | ICD-10-CM | POA: Insufficient documentation

## 2015-05-06 DIAGNOSIS — I129 Hypertensive chronic kidney disease with stage 1 through stage 4 chronic kidney disease, or unspecified chronic kidney disease: Secondary | ICD-10-CM | POA: Insufficient documentation

## 2015-05-06 DIAGNOSIS — R530 Neoplastic (malignant) related fatigue: Secondary | ICD-10-CM | POA: Insufficient documentation

## 2015-05-06 DIAGNOSIS — C770 Secondary and unspecified malignant neoplasm of lymph nodes of head, face and neck: Secondary | ICD-10-CM | POA: Insufficient documentation

## 2015-05-06 LAB — COMPREHENSIVE METABOLIC PANEL
ALK PHOS: 60 U/L (ref 38–126)
ALT: 9 U/L — AB (ref 17–63)
ANION GAP: 7 (ref 5–15)
AST: 17 U/L (ref 15–41)
Albumin: 3.9 g/dL (ref 3.5–5.0)
BILIRUBIN TOTAL: 0.3 mg/dL (ref 0.3–1.2)
BUN: 37 mg/dL — ABNORMAL HIGH (ref 6–20)
CALCIUM: 8.6 mg/dL — AB (ref 8.9–10.3)
CO2: 23 mmol/L (ref 22–32)
CREATININE: 1.71 mg/dL — AB (ref 0.61–1.24)
Chloride: 105 mmol/L (ref 101–111)
GFR, EST AFRICAN AMERICAN: 48 mL/min — AB (ref >60)
GFR, EST NON AFRICAN AMERICAN: 41 mL/min — AB (ref >60)
Glucose, Bld: 110 mg/dL — ABNORMAL HIGH (ref 65–99)
Potassium: 4.2 mmol/L (ref 3.5–5.1)
SODIUM: 135 mmol/L (ref 135–145)
TOTAL PROTEIN: 7.5 g/dL (ref 6.5–8.1)

## 2015-05-06 LAB — CBC WITH DIFFERENTIAL/PLATELET
Basophils Absolute: 0.1 10*3/uL (ref 0–0.1)
Basophils Relative: 1 %
Eosinophils Absolute: 0.1 10*3/uL (ref 0–0.7)
Eosinophils Relative: 1 %
HEMATOCRIT: 30.2 % — AB (ref 40.0–52.0)
HEMOGLOBIN: 10.6 g/dL — AB (ref 13.0–18.0)
LYMPHS ABS: 0.7 10*3/uL — AB (ref 1.0–3.6)
LYMPHS PCT: 9 %
MCH: 32.4 pg (ref 26.0–34.0)
MCHC: 35 g/dL (ref 32.0–36.0)
MCV: 92.7 fL (ref 80.0–100.0)
MONOS PCT: 10 %
Monocytes Absolute: 0.8 10*3/uL (ref 0.2–1.0)
NEUTROS ABS: 6.2 10*3/uL (ref 1.4–6.5)
NEUTROS PCT: 79 %
Platelets: 185 10*3/uL (ref 150–440)
RBC: 3.26 MIL/uL — AB (ref 4.40–5.90)
RDW: 23.1 % — ABNORMAL HIGH (ref 11.5–14.5)
WBC: 7.7 10*3/uL (ref 3.8–10.6)

## 2015-05-06 MED ORDER — SODIUM CHLORIDE 0.9 % IV SOLN
Freq: Once | INTRAVENOUS | Status: AC
  Administered 2015-05-06: 10:00:00 via INTRAVENOUS
  Filled 2015-05-06: qty 8

## 2015-05-06 MED ORDER — SODIUM CHLORIDE 0.9 % IV SOLN
Freq: Once | INTRAVENOUS | Status: AC
  Administered 2015-05-06: 10:00:00 via INTRAVENOUS
  Filled 2015-05-06: qty 1000

## 2015-05-06 MED ORDER — HEPARIN SOD (PORK) LOCK FLUSH 100 UNIT/ML IV SOLN: 500.0000 [IU] | Freq: Once | INTRAVENOUS | Status: AC | PRN

## 2015-05-06 MED ORDER — FAMOTIDINE IN NACL 20-0.9 MG/50ML-% IV SOLN
20.0000 mg | Freq: Once | INTRAVENOUS | Status: AC
  Administered 2015-05-06: 20 mg via INTRAVENOUS
  Filled 2015-05-06: qty 50

## 2015-05-06 MED ORDER — SODIUM CHLORIDE 0.9 % IV SOLN
110.0000 mg | Freq: Once | INTRAVENOUS | Status: AC
  Administered 2015-05-06: 110 mg via INTRAVENOUS
  Filled 2015-05-06: qty 11

## 2015-05-06 MED ORDER — SODIUM CHLORIDE 0.9% FLUSH
10.0000 mL | Freq: Once | INTRAVENOUS | Status: AC
  Administered 2015-05-06: 10 mL via INTRAVENOUS
  Filled 2015-05-06: qty 10

## 2015-05-06 MED ORDER — DIPHENHYDRAMINE HCL 50 MG/ML IJ SOLN
50.0000 mg | Freq: Once | INTRAMUSCULAR | Status: AC
  Administered 2015-05-06: 50 mg via INTRAVENOUS
  Filled 2015-05-06: qty 1

## 2015-05-06 MED ORDER — HEPARIN SOD (PORK) LOCK FLUSH 100 UNIT/ML IV SOLN
500.0000 [IU] | Freq: Once | INTRAVENOUS | Status: AC
  Administered 2015-05-06: 500 [IU] via INTRAVENOUS
  Filled 2015-05-06: qty 5

## 2015-05-07 ENCOUNTER — Ambulatory Visit
Admission: RE | Admit: 2015-05-07 | Discharge: 2015-05-07 | Disposition: A | Discharge status: Home / Self Care | Payer: Self-pay | Source: Ambulatory Visit | Attending: Radiation Oncology | Admitting: Radiation Oncology

## 2015-05-08 ENCOUNTER — Ambulatory Visit
Admission: RE | Admit: 2015-05-08 | Discharge: 2015-05-08 | Disposition: A | Discharge status: Home / Self Care | Payer: Self-pay | Source: Ambulatory Visit | Attending: Radiation Oncology | Admitting: Radiation Oncology

## 2015-05-08 ENCOUNTER — Telehealth: Payer: Self-pay

## 2015-05-08 ENCOUNTER — Other Ambulatory Visit: Payer: Self-pay | Admitting: Internal Medicine

## 2015-05-08 DIAGNOSIS — C154 Malignant neoplasm of middle third of esophagus: Secondary | ICD-10-CM

## 2015-05-08 DIAGNOSIS — C159 Malignant neoplasm of esophagus, unspecified: Secondary | ICD-10-CM

## 2015-05-08 MED ORDER — OXYCODONE HCL 5 MG/5ML PO SOLN: 5.0000 mg | Freq: Three times a day (TID) | ORAL | Status: DC | PRN

## 2015-05-08 NOTE — Telephone Encounter (Signed)
  Oncology Nurse Navigator Documentation  Navigator Location: [facility] (05/08/15 1000) Navigator Encounter Type: Telephone (05/08/15 1000)                                          Time Spent with Patient: 15 (05/08/15 1000)   AReceived call from Jeffrey [name] needing refill while he is here for his oxycodone liquid. Heather notified and will get script for him.

## 2015-05-11 ENCOUNTER — Ambulatory Visit
Admission: RE | Admit: 2015-05-11 | Discharge: 2015-05-11 | Disposition: A | Discharge status: Home / Self Care | Payer: Self-pay | Source: Ambulatory Visit | Attending: Radiation Oncology | Admitting: Radiation Oncology

## 2015-05-12 ENCOUNTER — Ambulatory Visit
Admission: RE | Admit: 2015-05-12 | Discharge: 2015-05-12 | Disposition: A | Discharge status: Home / Self Care | Payer: Self-pay | Source: Ambulatory Visit | Attending: Radiation Oncology | Admitting: Radiation Oncology

## 2015-05-13 ENCOUNTER — Other Ambulatory Visit: Payer: Self-pay | Admitting: Internal Medicine

## 2015-05-13 ENCOUNTER — Inpatient Hospital Stay: Payer: Self-pay

## 2015-05-13 ENCOUNTER — Inpatient Hospital Stay (HOSPITAL_BASED_OUTPATIENT_CLINIC_OR_DEPARTMENT_OTHER): Payer: Self-pay | Admitting: Internal Medicine

## 2015-05-13 ENCOUNTER — Other Ambulatory Visit: Payer: Self-pay

## 2015-05-13 ENCOUNTER — Ambulatory Visit
Admission: RE | Admit: 2015-05-13 | Discharge: 2015-05-13 | Disposition: A | Discharge status: Home / Self Care | Payer: Self-pay | Source: Ambulatory Visit | Attending: Radiation Oncology | Admitting: Radiation Oncology

## 2015-05-13 VITALS — BP 125/80 | HR 74 | Temp 98.4°F | Resp 18 | Ht 67.0 in | Wt 137.8 lb

## 2015-05-13 DIAGNOSIS — C154 Malignant neoplasm of middle third of esophagus: Secondary | ICD-10-CM

## 2015-05-13 DIAGNOSIS — J439 Emphysema, unspecified: Secondary | ICD-10-CM

## 2015-05-13 DIAGNOSIS — F17210 Nicotine dependence, cigarettes, uncomplicated: Secondary | ICD-10-CM

## 2015-05-13 DIAGNOSIS — G893 Neoplasm related pain (acute) (chronic): Secondary | ICD-10-CM

## 2015-05-13 DIAGNOSIS — R530 Neoplastic (malignant) related fatigue: Secondary | ICD-10-CM

## 2015-05-13 DIAGNOSIS — D649 Anemia, unspecified: Secondary | ICD-10-CM

## 2015-05-13 DIAGNOSIS — N183 Chronic kidney disease, stage 3 (moderate): Secondary | ICD-10-CM

## 2015-05-13 DIAGNOSIS — Z931 Gastrostomy status: Secondary | ICD-10-CM

## 2015-05-13 DIAGNOSIS — Z8551 Personal history of malignant neoplasm of bladder: Secondary | ICD-10-CM

## 2015-05-13 DIAGNOSIS — E46 Unspecified protein-calorie malnutrition: Secondary | ICD-10-CM

## 2015-05-13 DIAGNOSIS — Z85118 Personal history of other malignant neoplasm of bronchus and lung: Secondary | ICD-10-CM

## 2015-05-13 DIAGNOSIS — R1310 Dysphagia, unspecified: Secondary | ICD-10-CM

## 2015-05-13 DIAGNOSIS — C770 Secondary and unspecified malignant neoplasm of lymph nodes of head, face and neck: Secondary | ICD-10-CM

## 2015-05-13 DIAGNOSIS — I129 Hypertensive chronic kidney disease with stage 1 through stage 4 chronic kidney disease, or unspecified chronic kidney disease: Secondary | ICD-10-CM

## 2015-05-13 LAB — COMPREHENSIVE METABOLIC PANEL
ALBUMIN: 4 g/dL (ref 3.5–5.0)
ALK PHOS: 61 U/L (ref 38–126)
ALT: 9 U/L — AB (ref 17–63)
AST: 16 U/L (ref 15–41)
Anion gap: 9 (ref 5–15)
BILIRUBIN TOTAL: 0.7 mg/dL (ref 0.3–1.2)
BUN: 54 mg/dL — AB (ref 6–20)
CALCIUM: 8.7 mg/dL — AB (ref 8.9–10.3)
CO2: 22 mmol/L (ref 22–32)
CREATININE: 1.99 mg/dL — AB (ref 0.61–1.24)
Chloride: 106 mmol/L (ref 101–111)
GFR calc Af Amer: 40 mL/min — ABNORMAL LOW (ref >60)
GFR calc non Af Amer: 34 mL/min — ABNORMAL LOW (ref >60)
GLUCOSE: 130 mg/dL — AB (ref 65–99)
Potassium: 4.5 mmol/L (ref 3.5–5.1)
Sodium: 137 mmol/L (ref 135–145)
TOTAL PROTEIN: 7.7 g/dL (ref 6.5–8.1)

## 2015-05-13 LAB — CBC WITH DIFFERENTIAL/PLATELET
BASOS ABS: 0 10*3/uL (ref 0–0.1)
BASOS PCT: 1 %
Eosinophils Absolute: 0.1 10*3/uL (ref 0–0.7)
Eosinophils Relative: 1 %
HEMATOCRIT: 31.3 % — AB (ref 40.0–52.0)
HEMOGLOBIN: 10.7 g/dL — AB (ref 13.0–18.0)
Lymphocytes Relative: 10 %
Lymphs Abs: 0.7 10*3/uL — ABNORMAL LOW (ref 1.0–3.6)
MCH: 32.2 pg (ref 26.0–34.0)
MCHC: 34.2 g/dL (ref 32.0–36.0)
MCV: 94.1 fL (ref 80.0–100.0)
MONOS PCT: 12 %
Monocytes Absolute: 0.8 10*3/uL (ref 0.2–1.0)
NEUTROS ABS: 5.5 10*3/uL (ref 1.4–6.5)
NEUTROS PCT: 76 %
Platelets: 167 10*3/uL (ref 150–440)
RBC: 3.33 MIL/uL — ABNORMAL LOW (ref 4.40–5.90)
RDW: 22.2 % — ABNORMAL HIGH (ref 11.5–14.5)
WBC: 7.2 10*3/uL (ref 3.8–10.6)

## 2015-05-13 MED ORDER — FAMOTIDINE IN NACL 20-0.9 MG/50ML-% IV SOLN
20.0000 mg | Freq: Once | INTRAVENOUS | Status: AC
  Administered 2015-05-13: 20 mg via INTRAVENOUS
  Filled 2015-05-13: qty 50

## 2015-05-13 MED ORDER — SODIUM CHLORIDE 0.9 % IV SOLN
Freq: Once | INTRAVENOUS | Status: AC
  Administered 2015-05-13: 10:00:00 via INTRAVENOUS
  Filled 2015-05-13: qty 1000

## 2015-05-13 MED ORDER — HEPARIN SOD (PORK) LOCK FLUSH 100 UNIT/ML IV SOLN
500.0000 [IU] | Freq: Once | INTRAVENOUS | Status: AC | PRN
  Administered 2015-05-13: 500 [IU]
  Filled 2015-05-13: qty 5

## 2015-05-13 MED ORDER — DIPHENHYDRAMINE HCL 50 MG/ML IJ SOLN
50.0000 mg | Freq: Once | INTRAMUSCULAR | Status: AC
Start: 2015-05-13 — End: 2015-05-13
  Administered 2015-05-13: 50 mg via INTRAVENOUS
  Filled 2015-05-13: qty 1

## 2015-05-13 MED ORDER — SODIUM CHLORIDE 0.9 % IV SOLN
Freq: Once | INTRAVENOUS | Status: AC
  Administered 2015-05-13: 10:00:00 via INTRAVENOUS
  Filled 2015-05-13: qty 8

## 2015-05-13 MED ORDER — SODIUM CHLORIDE 0.9 % IV SOLN
113.2000 mg | Freq: Once | INTRAVENOUS | Status: AC
  Administered 2015-05-13: 110 mg via INTRAVENOUS
  Filled 2015-05-13: qty 11

## 2015-05-13 MED ORDER — SODIUM CHLORIDE 0.9 % IJ SOLN
10.0000 mL | INTRAMUSCULAR | Status: DC | PRN
  Administered 2015-05-13: 10 mL
  Filled 2015-05-13: qty 10

## 2015-05-13 NOTE — Progress Notes (Signed)
.[place] NOTE  Patient Care Team: Adin Hector, MD as PCP - General (Internal Medicine) Clent Jacks, RN as Registered Nurse  CHIEF COMPLAINTS/PURPOSE OF CONSULTATION:   # OCT 2016- SQUAMOUS CELL CA of middle esophagus; Stage IV [EGD bx]; PET- Esopahgeal uptake & 2 CM Left Cervical LN-MET SQUAM; Carbo-taxol [Nov 9th]with RT [Nov 14th];  Cont Weekly Carbo- RT;   # JAN 3rd- 2017-PET stable esophageal uptake/slight increase neck adenopathy- recommend local radiation to the neck with carboplatin  # TAXOL HELD sec to ACUTE INFUSION REACTIONS  # malnutrition s/p PEG tube  # CKD [stage III]; Hx of non-invasive bladder ca [s/p cystectomy with neo-bladder; [facility]]  HISTORY OF PRESENTING ILLNESS:  Jeffrey [name] 65 y.o. 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  Patient notes to have a slight increase in the size of the right neck lymph node; he has pain in that area. He is taking oxycodone liquid; also for the pain in the PEG tube site.  Patient's appetite isstudy. Denies any nausea vomiting. Weight around 135 pounds today. His swallowing is improved; currently stable; he does still continue to have some difficulty swallowing.   Denies any fever or chills nausea vomiting.   ROS: A complete 10 point review of system is done which is negative except mentioned above in history of present illness  MEDICAL HISTORY:  Past Medical History  Diagnosis Date  . Hypertension   . Bladder cancer ([place])   . Emphysema of lung ([place])   . Lung cancer ([place])   . Chronic kidney disease     SURGICAL HISTORY: Past Surgical History  Procedure Laterality Date  . Bladder removal    . Esophagogastroduodenoscopy (egd) with propofol N/A 01/21/2015    Procedure: ESOPHAGOGASTRODUODENOSCOPY (EGD) WITH PROPOFOL-looking in the esophagus stomach and  upper small intestine to evaluate and treat;  Surgeon: [name], MD;  Location: [facility] ENDOSCOPY;  Service: Endoscopy;  Laterality: N/A;  . Gastrostomy w/ feeding tube Left     placed end of october 2016  . Portacath placement Right 02/04/2015    Procedure: INSERTION PORT-A-CATH;  Surgeon: [name], MD;  Location: [facility] ORS;  Service: General;  Laterality: Right;  . Lymph node biopsy N/A 02/04/2015    Procedure: LYMPH NODE BIOPSY;  Surgeon: [name], MD;  Location: [facility] ORS;  Service: General;  Laterality: N/A;    SOCIAL HISTORY: Social History   Social History  . Marital Status: Married    Spouse Name: N/A  . Number of Children: N/A  . Years of Education: N/A   Occupational History  . Not on file.   Social History Main Topics  . Smoking status: Current Every Day Smoker -- 0.50 packs/day    Types: Cigarettes  . Smokeless tobacco: Current User    Types: Chew  . Alcohol Use: No  . Drug Use: No  . Sexual Activity: Not on file   Other Topics Concern  . Not on file   Social History Narrative    FAMILY HISTORY: Family History  Problem Relation Age of Onset  . Hypertension Other     ALLERGIES:  is allergic to aspirin and taxol.  MEDICATIONS:  Current Outpatient Prescriptions  Medication Sig Dispense Refill  . lidocaine-prilocaine (EMLA) cream Apply cream 1 hour before chemotherapy treatment, place a small amount of saran wrap over the cream to protect your clothing 30  g 1  . neomycin-bacitracin-polymyxin (NEOSPORIN) OINT Apply 1 application topically daily. 30 g 1  . Nutritional Supplements (FEEDING SUPPLEMENT, JEVITY 1.5 CAL/FIBER,) LIQD Place 237 mLs into feeding tube 6 (six) times daily. 1000 mL 5  . oxyCODONE (ROXICODONE) 5 MG/5ML solution Place 5 mLs (5 mg total) into feeding tube every 8 (eight) hours as needed for severe pain. 100 mL 0   No current facility-administered medications for this visit.   Facility-Administered Medications Ordered in Other  Visits  Medication Dose Route Frequency Provider Last Rate Last Dose  . 0.9 %  sodium chloride infusion   Intravenous Once Cammie Sickle, MD      . CARBOplatin (PARAPLATIN) 110 mg in sodium chloride 0.9 % 100 mL chemo infusion  110 mg Intravenous Once Cammie Sickle, MD      . diphenhydrAMINE (BENADRYL) injection 50 mg  50 mg Intravenous Once Cammie Sickle, MD      . famotidine (PEPCID) IVPB 20 mg premix  20 mg Intravenous Once Cammie Sickle, MD      . heparin lock flush 100 unit/mL  500 Units Intracatheter Once PRN Cammie Sickle, MD      . ondansetron (ZOFRAN) 16 mg, dexamethasone (DECADRON) 20 mg in sodium chloride 0.9 % 50 mL IVPB   Intravenous Once Cammie Sickle, MD      . sodium chloride 0.9 % injection 10 mL  10 mL Intracatheter PRN Cammie Sickle, MD   10 mL at 03/04/15 0933  . sodium chloride 0.9 % injection 10 mL  10 mL Intracatheter PRN Cammie Sickle, MD   10 mL at 05/13/15 0850      .  PHYSICAL EXAMINATION: ECOG PERFORMANCE STATUS: 1 - Symptomatic but completely ambulatory  Filed Vitals:   05/13/15 0908  BP: 125/80  Pulse: 74  Temp: 98.4 F (36.9 C)  Resp: 18   Filed Weights   05/13/15 0908  Weight: 137 lb 12.6 oz (62.5 kg)    GENERAL: Thin built; moderately nourished. Alert, no distress and comfortable.   Accompanied by his wife EYES: no pallor or icterus OROPHARYNX: no thrush or ulceration; good dentition  NECK: supple, no masses felt LYMPH:  3-4cm CM palpable lymphadenopathy in the cervical on left side [slight increase] LUNGS: clear to auscultation and  No wheeze or crackles HEART/CVS: regular rate & rhythm and no murmurs; No lower extremity edema ABDOMEN: abdomen soft, non-tender and normal bowel sounds; PEG tube in place Musculoskeletal:no cyanosis of digits and no clubbing  PSYCH: alert & oriented x 3 with fluent speech NEURO: no focal motor/sensory deficits SKIN:  no rashes or significant  lesions  LABORATORY DATA:  I have reviewed the data as listed Lab Results  Component Value Date   WBC 7.2 05/13/2015   HGB 10.7* 05/13/2015   HCT 31.3* 05/13/2015   MCV 94.1 05/13/2015   PLT 167 05/13/2015    Recent Labs  04/29/15 0835 05/06/15 0835 05/13/15 0850  NA 136 135 137  K 4.4 4.2 4.5  CL 107 105 106  CO2 '24 23 22  ' GLUCOSE 143* 110* 130*  BUN 47* 37* 54*  CREATININE 2.00* 1.71* 1.99*  CALCIUM 8.7* 8.6* 8.7*  GFRNONAA 34* 41* 34*  GFRAA 39* 48* 40*  PROT 7.7 7.5 7.7  ALBUMIN 4.0 3.9 4.0  AST 14* 17 16  ALT 8* 9* 9*  ALKPHOS 56 60 61  BILITOT 0.6 0.3 0.7    RADIOGRAPHIC STUDIES: I have personally reviewed the radiological  images as listed and agreed with the findings in the report. No results found.  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   # Oligometastatic disease in the left neck; significant improvement noted at this time. patient will finish radiation along with carboplatin AUC 2 on  on the 10th.  #  Plan to start 5-FU continuous infusion/ along with Irinotecan every 2 weeks- starting 20th. I reviewed the potential side effects of the chemotherapy including but not limited to diarrhea oral sores.   # Chronic kidney disease- base and creatinine around 1.6; today creatinine is ~2.0;    # Regards to anemia- appears to be more from anemia of chronic kidney disease-hemoglobin is 10.   # Pain secondary to malignancy/ PEG tube- continue oxycodone.  #  I will see the patient back on 20th/ CBC CMP at that time.  # 25 minutes face-to-face with the patient discussing the above plan of care; more than 50% of time spent on prognosis/ natural history; counseling and coordination.      Cammie Sickle, MD 05/13/2015 9:31 AM

## 2015-05-14 ENCOUNTER — Ambulatory Visit
Admission: RE | Admit: 2015-05-14 | Discharge: 2015-05-14 | Disposition: A | Discharge status: Home / Self Care | Payer: Self-pay | Source: Ambulatory Visit | Attending: Radiation Oncology | Admitting: Radiation Oncology

## 2015-05-14 ENCOUNTER — Telehealth: Payer: Self-pay | Admitting: *Deleted

## 2015-05-14 ENCOUNTER — Other Ambulatory Visit: Payer: Self-pay | Admitting: Internal Medicine

## 2015-05-14 DIAGNOSIS — C159 Malignant neoplasm of esophagus, unspecified: Secondary | ICD-10-CM | POA: Insufficient documentation

## 2015-05-14 MED ORDER — DIPHENOXYLATE-ATROPINE 2.5-0.025 MG PO TABS: 1.0000 | ORAL_TABLET | Freq: Four times a day (QID) | ORAL | Status: DC | PRN

## 2015-05-14 MED ORDER — ONDANSETRON HCL 8 MG PO TABS: 8.0000 mg | ORAL_TABLET | Freq: Three times a day (TID) | ORAL | Status: DC | PRN

## 2015-05-14 MED ORDER — PROCHLORPERAZINE MALEATE 10 MG PO TABS: 10.0000 mg | ORAL_TABLET | Freq: Four times a day (QID) | ORAL | Status: DC | PRN

## 2015-05-14 NOTE — Telephone Encounter (Signed)
Lomotil prescription faxed to [facility]. Attempted to call patient at 31. Phone rings. Unable to leave vm-mailbox full. I left vm on Brenda's Majeed's phone asking that patient be notified to contact Dr. Aletha Halim office for new medication instructions.

## 2015-05-14 NOTE — Telephone Encounter (Signed)
Attempted to call patient at [phone]. Unable to leave a vm as mail box is full.

## 2015-05-14 NOTE — Telephone Encounter (Signed)
-----   Message from Cammie Sickle, MD sent at 05/14/2015 11:41 AM EST ----- I ordered 2 prescriptions for the patient; Lomotil is to be printed. He needs to start Lomotil when he starts prior 30-60 mins prior to FOLFIRI chemotherapy on feb 20th. Thx

## 2015-05-15 ENCOUNTER — Ambulatory Visit
Admission: RE | Admit: 2015-05-15 | Discharge: 2015-05-15 | Disposition: A | Discharge status: Home / Self Care | Payer: Self-pay | Source: Ambulatory Visit | Attending: Radiation Oncology | Admitting: Radiation Oncology

## 2015-05-20 ENCOUNTER — Inpatient Hospital Stay
Admission: EM | Admit: 2015-05-20 | Discharge: 2015-05-23 | DRG: 690 | Disposition: A | Discharge status: Home Health | Payer: Self-pay | Attending: Internal Medicine | Admitting: Internal Medicine

## 2015-05-20 ENCOUNTER — Encounter: Payer: Self-pay | Admitting: *Deleted

## 2015-05-20 ENCOUNTER — Telehealth: Payer: Self-pay | Admitting: *Deleted

## 2015-05-20 DIAGNOSIS — Z79899 Other long term (current) drug therapy: Secondary | ICD-10-CM

## 2015-05-20 DIAGNOSIS — Z886 Allergy status to analgesic agent status: Secondary | ICD-10-CM

## 2015-05-20 DIAGNOSIS — R112 Nausea with vomiting, unspecified: Secondary | ICD-10-CM | POA: Diagnosis present

## 2015-05-20 DIAGNOSIS — Z906 Acquired absence of other parts of urinary tract: Secondary | ICD-10-CM

## 2015-05-20 DIAGNOSIS — N3090 Cystitis, unspecified without hematuria: Secondary | ICD-10-CM

## 2015-05-20 DIAGNOSIS — R1310 Dysphagia, unspecified: Secondary | ICD-10-CM | POA: Diagnosis present

## 2015-05-20 DIAGNOSIS — N183 Chronic kidney disease, stage 3 (moderate): Secondary | ICD-10-CM | POA: Diagnosis present

## 2015-05-20 DIAGNOSIS — Z9221 Personal history of antineoplastic chemotherapy: Secondary | ICD-10-CM

## 2015-05-20 DIAGNOSIS — N3000 Acute cystitis without hematuria: Principal | ICD-10-CM | POA: Diagnosis present

## 2015-05-20 DIAGNOSIS — Z6820 Body mass index (BMI) 20.0-20.9, adult: Secondary | ICD-10-CM

## 2015-05-20 DIAGNOSIS — Z8249 Family history of ischemic heart disease and other diseases of the circulatory system: Secondary | ICD-10-CM

## 2015-05-20 DIAGNOSIS — I129 Hypertensive chronic kidney disease with stage 1 through stage 4 chronic kidney disease, or unspecified chronic kidney disease: Secondary | ICD-10-CM | POA: Diagnosis present

## 2015-05-20 DIAGNOSIS — Z8551 Personal history of malignant neoplasm of bladder: Secondary | ICD-10-CM

## 2015-05-20 DIAGNOSIS — F17210 Nicotine dependence, cigarettes, uncomplicated: Secondary | ICD-10-CM | POA: Diagnosis present

## 2015-05-20 DIAGNOSIS — E46 Unspecified protein-calorie malnutrition: Secondary | ICD-10-CM | POA: Diagnosis present

## 2015-05-20 DIAGNOSIS — C770 Secondary and unspecified malignant neoplasm of lymph nodes of head, face and neck: Secondary | ICD-10-CM | POA: Diagnosis present

## 2015-05-20 DIAGNOSIS — K222 Esophageal obstruction: Secondary | ICD-10-CM | POA: Diagnosis present

## 2015-05-20 DIAGNOSIS — B9689 Other specified bacterial agents as the cause of diseases classified elsewhere: Secondary | ICD-10-CM | POA: Diagnosis present

## 2015-05-20 DIAGNOSIS — Z923 Personal history of irradiation: Secondary | ICD-10-CM

## 2015-05-20 DIAGNOSIS — E860 Dehydration: Secondary | ICD-10-CM | POA: Diagnosis present

## 2015-05-20 DIAGNOSIS — Z888 Allergy status to other drugs, medicaments and biological substances status: Secondary | ICD-10-CM

## 2015-05-20 DIAGNOSIS — N189 Chronic kidney disease, unspecified: Secondary | ICD-10-CM

## 2015-05-20 DIAGNOSIS — Z803 Family history of malignant neoplasm of breast: Secondary | ICD-10-CM

## 2015-05-20 DIAGNOSIS — Z79891 Long term (current) use of opiate analgesic: Secondary | ICD-10-CM

## 2015-05-20 DIAGNOSIS — N179 Acute kidney failure, unspecified: Secondary | ICD-10-CM | POA: Diagnosis present

## 2015-05-20 DIAGNOSIS — C159 Malignant neoplasm of esophagus, unspecified: Secondary | ICD-10-CM | POA: Diagnosis present

## 2015-05-20 DIAGNOSIS — Z931 Gastrostomy status: Secondary | ICD-10-CM

## 2015-05-20 DIAGNOSIS — J439 Emphysema, unspecified: Secondary | ICD-10-CM | POA: Diagnosis present

## 2015-05-20 HISTORY — DX: Malignant neoplasm of esophagus, unspecified: C15.9

## 2015-05-20 LAB — COMPREHENSIVE METABOLIC PANEL
ALBUMIN: 4.4 g/dL (ref 3.5–5.0)
ALT: 11 U/L — ABNORMAL LOW (ref 17–63)
AST: 23 U/L (ref 15–41)
Alkaline Phosphatase: 65 U/L (ref 38–126)
Anion gap: 12 (ref 5–15)
BILIRUBIN TOTAL: 0.9 mg/dL (ref 0.3–1.2)
BUN: 67 mg/dL — AB (ref 6–20)
CHLORIDE: 112 mmol/L — AB (ref 101–111)
CO2: 23 mmol/L (ref 22–32)
Calcium: 9.4 mg/dL (ref 8.9–10.3)
Creatinine, Ser: 2.28 mg/dL — ABNORMAL HIGH (ref 0.61–1.24)
GFR calc Af Amer: 34 mL/min — ABNORMAL LOW (ref >60)
GFR calc non Af Amer: 29 mL/min — ABNORMAL LOW (ref >60)
GLUCOSE: 113 mg/dL — AB (ref 65–99)
POTASSIUM: 4.6 mmol/L (ref 3.5–5.1)
SODIUM: 147 mmol/L — AB (ref 135–145)
TOTAL PROTEIN: 9.2 g/dL — AB (ref 6.5–8.1)

## 2015-05-20 LAB — CBC WITH DIFFERENTIAL/PLATELET
BASOS ABS: 0 10*3/uL (ref 0–0.1)
BASOS PCT: 0 %
EOS ABS: 0 10*3/uL (ref 0–0.7)
Eosinophils Relative: 0 %
HEMATOCRIT: 36.8 % — AB (ref 40.0–52.0)
Hemoglobin: 12.1 g/dL — ABNORMAL LOW (ref 13.0–18.0)
Lymphocytes Relative: 4 %
Lymphs Abs: 0.4 10*3/uL — ABNORMAL LOW (ref 1.0–3.6)
MCH: 31.3 pg (ref 26.0–34.0)
MCHC: 32.9 g/dL (ref 32.0–36.0)
MCV: 95.2 fL (ref 80.0–100.0)
MONO ABS: 0.4 10*3/uL (ref 0.2–1.0)
Monocytes Relative: 4 %
NEUTROS ABS: 8.9 10*3/uL — AB (ref 1.4–6.5)
NEUTROS PCT: 92 %
Platelets: 144 10*3/uL — ABNORMAL LOW (ref 150–440)
RBC: 3.86 MIL/uL — ABNORMAL LOW (ref 4.40–5.90)
RDW: 22 % — AB (ref 11.5–14.5)
WBC: 9.8 10*3/uL (ref 3.8–10.6)

## 2015-05-20 LAB — URINALYSIS COMPLETE WITH MICROSCOPIC (ARMC ONLY)
Bilirubin Urine: NEGATIVE
Glucose, UA: NEGATIVE mg/dL
KETONES UR: NEGATIVE mg/dL
NITRITE: NEGATIVE
PH: 8 (ref 5.0–8.0)
PROTEIN: 30 mg/dL — AB
SPECIFIC GRAVITY, URINE: 1.01 (ref 1.005–1.030)

## 2015-05-20 LAB — TROPONIN I: Troponin I: 0.03 ng/mL (ref <0.031)

## 2015-05-20 MED ORDER — ONDANSETRON HCL 4 MG PO TABS: 4.0000 mg | ORAL_TABLET | Freq: Four times a day (QID) | ORAL | Status: DC | PRN

## 2015-05-20 MED ORDER — ONDANSETRON HCL 4 MG/2ML IJ SOLN
4.0000 mg | Freq: Four times a day (QID) | INTRAMUSCULAR | Status: DC | PRN
  Administered 2015-05-20 – 2015-05-23 (×8): 4 mg via INTRAVENOUS
  Filled 2015-05-20 (×7): qty 2

## 2015-05-20 MED ORDER — DEXTROSE 5 % IV SOLN
1.0000 g | Freq: Once | INTRAVENOUS | Status: AC
  Administered 2015-05-20: 1 g via INTRAVENOUS
  Filled 2015-05-20: qty 10

## 2015-05-20 MED ORDER — DEXTROSE 5 % IV SOLN
1.0000 g | INTRAVENOUS | Status: DC
  Administered 2015-05-21 – 2015-05-22 (×2): 1 g via INTRAVENOUS
  Filled 2015-05-20 (×2): qty 10

## 2015-05-20 MED ORDER — JEVITY 1.5 CAL/FIBER PO LIQD
237.0000 mL | Freq: Every day | ORAL | Status: DC
  Administered 2015-05-20: 237 mL

## 2015-05-20 MED ORDER — ENOXAPARIN SODIUM 40 MG/0.4ML ~~LOC~~ SOLN: 40.0000 mg | SUBCUTANEOUS | Status: DC

## 2015-05-20 MED ORDER — ACETAMINOPHEN 650 MG RE SUPP: 650.0000 mg | Freq: Four times a day (QID) | RECTAL | Status: DC | PRN

## 2015-05-20 MED ORDER — AMLODIPINE BESYLATE 5 MG PO TABS
5.0000 mg | ORAL_TABLET | Freq: Every day | ORAL | Status: DC
  Administered 2015-05-20 – 2015-05-21 (×2): 5 mg via ORAL
  Filled 2015-05-20 (×2): qty 1

## 2015-05-20 MED ORDER — SODIUM CHLORIDE 0.9 % IV SOLN
INTRAVENOUS | Status: DC
  Administered 2015-05-20 – 2015-05-22 (×7): via INTRAVENOUS

## 2015-05-20 MED ORDER — MORPHINE SULFATE (PF) 4 MG/ML IV SOLN
INTRAVENOUS | Status: AC
  Filled 2015-05-20: qty 1

## 2015-05-20 MED ORDER — JEVITY 1.5 CAL/FIBER PO LIQD
237.0000 mL | Freq: Every day | ORAL | Status: DC
  Administered 2015-05-20 – 2015-05-22 (×8): 237 mL

## 2015-05-20 MED ORDER — DEXTROSE 5 % IV SOLN
Freq: Once | INTRAVENOUS | Status: AC
  Administered 2015-05-20: 1000 mL via INTRAVENOUS

## 2015-05-20 MED ORDER — ACETAMINOPHEN 325 MG PO TABS: 650.0000 mg | ORAL_TABLET | Freq: Four times a day (QID) | ORAL | Status: DC | PRN

## 2015-05-20 MED ORDER — MORPHINE SULFATE (PF) 4 MG/ML IV SOLN
4.0000 mg | Freq: Once | INTRAVENOUS | Status: AC
  Administered 2015-05-20: 4 mg via INTRAVENOUS

## 2015-05-20 MED ORDER — OXYCODONE HCL 5 MG/5ML PO SOLN
5.0000 mg | Freq: Three times a day (TID) | ORAL | Status: DC | PRN
  Administered 2015-05-20 – 2015-05-21 (×4): 5 mg
  Filled 2015-05-20 (×4): qty 5

## 2015-05-20 MED ORDER — SODIUM CHLORIDE 0.9 % IV SOLN
1000.0000 mL | Freq: Once | INTRAVENOUS | Status: AC
  Administered 2015-05-20: 1000 mL via INTRAVENOUS

## 2015-05-20 MED ORDER — ONDANSETRON HCL 4 MG/2ML IJ SOLN
4.0000 mg | Freq: Once | INTRAMUSCULAR | Status: AC
  Administered 2015-05-20: 4 mg via INTRAVENOUS
  Filled 2015-05-20: qty 2

## 2015-05-20 NOTE — ED Notes (Signed)
Pt resting, wife at bedside.  

## 2015-05-20 NOTE — H&P (Signed)
[place] [facility] [place] NAME: Jeffrey [name]    MR#:  [id number]  DATE OF BIRTH:  28-Sep-1950  DATE OF ADMISSION:  05/20/2015  PRIMARY CARE PHYSICIAN: Adin Hector, MD   REQUESTING/REFERRING PHYSICIAN: Dr. Lenise Arena  CHIEF COMPLAINT:   Chief Complaint  Patient presents with  . Emesis    HISTORY OF PRESENT ILLNESS:  Jeffrey [name]  is a 65 y.o. 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 Patient was diagnosed with esophageal cancer in October 2016 and had received radiation for esophagus and also just finished his radiation for the left neck lymph node last week. His been on carboplatin and Taxol for his cancer, Taxol was held due to infusion reaction. His last dose of chemotherapy was last week. He is being on Zofran and Compazine as needed for his vomiting as outpatient. But states he hasn't needed any of his medications until yesterday. He has a PEG tube since his radiation and cancer was diagnosed as he has significant dysphagia. He uses his PEG tube for tube feeds, bolus feeds. But he still is able to drink water by mouth. He says since yesterday what ever he is drinking has been coming out. He says his tube feeds he has been tolerating fine without any vomiting. He feels dehydrated and presented to the emergency room. He has CK D stage III at baseline however creatinine seems to be worsened now. Due to his history of bladder cancer in, he has a reconstructed bladder and sometimes self catheterizes himself. Denies any fevers or chills. The urine shows infection.  PAST MEDICAL HISTORY:   Past Medical History  Diagnosis Date  . Hypertension   . Bladder cancer [facility])      s/p surgery and reconstruction  . Emphysema of lung ([place])   . Esophageal cancer ([place])   . Chronic kidney disease     stage 3    PAST SURGICAL HISTORY:   Past Surgical History  Procedure Laterality Date  . Bladder removal    . Esophagogastroduodenoscopy (egd) with propofol N/A 01/21/2015    Procedure: ESOPHAGOGASTRODUODENOSCOPY (EGD) WITH PROPOFOL-looking in the esophagus stomach and upper small intestine to evaluate and treat;  Surgeon: [name], MD;  Location: [facility] ENDOSCOPY;  Service: Endoscopy;  Laterality: N/A;  . Gastrostomy w/ feeding tube Left     placed end of october 2016  . Portacath placement Right 02/04/2015    Procedure: INSERTION PORT-A-CATH;  Surgeon: [name], MD;  Location: [facility] ORS;  Service: General;  Laterality: Right;  . Lymph node biopsy N/A 02/04/2015    Procedure: LYMPH NODE BIOPSY;  Surgeon: [name], MD;  Location: [facility] ORS;  Service: General;  Laterality: N/A;    SOCIAL HISTORY:   Social History  Substance Use Topics  . Smoking status: Current Every Day Smoker -- 0.50 packs/day    Types: Cigarettes  . Smokeless tobacco: Current User    Types: Chew  . Alcohol Use: No     Comment: quit drinking 3 months ago    FAMILY HISTORY:   Family History  Problem Relation Age of Onset  . CAD Father   . Breast cancer Mother     DRUG ALLERGIES:   Allergies  Allergen Reactions  .  Aspirin Nausea Only  . Taxol [Paclitaxel] Other (See Comments)    Diaphoretic, flushing and tightness in chest    REVIEW OF SYSTEMS:   Review of Systems  Constitutional: Positive for weight loss and malaise/fatigue. Negative for fever and chills.  HENT: Negative for ear discharge, ear pain, hearing loss, nosebleeds and tinnitus.   Eyes: Negative for blurred vision, double vision and photophobia.  Respiratory: Negative for cough, hemoptysis, shortness of breath and wheezing.   Cardiovascular: Negative for chest pain, palpitations, orthopnea and leg  swelling.  Gastrointestinal: Positive for nausea and vomiting. Negative for heartburn, abdominal pain, diarrhea, constipation and melena.  Genitourinary: Negative for dysuria, urgency and frequency.       Retention requiring self catheterization as needed  Musculoskeletal: Negative for myalgias, back pain and neck pain.  Skin: Negative for rash.  Neurological: Negative for dizziness, tingling, tremors, speech change, focal weakness and headaches.       Some peripheral neuropathy  Endo/Heme/Allergies: Does not bruise/bleed easily.  Psychiatric/Behavioral: Negative for depression. The patient is not nervous/anxious.     MEDICATIONS AT HOME:   Prior to Admission medications   Medication Sig Start Date End Date Taking? Authorizing Provider  lidocaine-prilocaine (EMLA) cream Apply cream 1 hour before chemotherapy treatment, place a small amount of saran wrap over the cream to protect your clothing 02/10/15  Yes Cammie Sickle, MD  neomycin-bacitracin-polymyxin (NEOSPORIN) OINT Apply 1 application topically daily. 01/24/15  Yes Vishwanath Hande, MD  Nutritional Supplements (FEEDING SUPPLEMENT, JEVITY 1.5 CAL/FIBER,) LIQD Place 237 mLs into feeding tube 6 (six) times daily. 01/24/15  Yes Vishwanath Hande, MD  oxyCODONE (ROXICODONE) 5 MG/5ML solution Place 5 mLs (5 mg total) into feeding tube every 8 (eight) hours as needed for severe pain. 05/08/15  Yes Cammie Sickle, MD  diphenoxylate-atropine (LOMOTIL) 2.5-0.025 MG tablet Take 1 tablet by mouth 4 (four) times daily as needed for diarrhea or loose stools. Also take 1 pills 30-60 minutes prior to starting chemotherapy/week cycle. Patient not taking: Reported on 05/20/2015 05/14/15   Cammie Sickle, MD  ondansetron (ZOFRAN) 8 MG tablet Take 1 tablet (8 mg total) by mouth every 8 (eight) hours as needed for nausea or vomiting (start 3 days; after chemo). Patient not taking: Reported on 05/20/2015 05/14/15   Cammie Sickle, MD   prochlorperazine (COMPAZINE) 10 MG tablet Take 1 tablet (10 mg total) by mouth every 6 (six) hours as needed for nausea or vomiting. Patient not taking: Reported on 05/20/2015 05/14/15   Cammie Sickle, MD      VITAL SIGNS:  Blood pressure 169/102, pulse 77, temperature 98.3 F (36.8 C), temperature source Oral, resp. rate 17, height 5\' 7"  (1.702 m), weight 62.143 kg (137 lb), SpO2 100 %.  PHYSICAL EXAMINATION:   Physical Exam  GENERAL:  65 y.o.-year-old thin appearing, malnourished patient lying in the bed with no acute distress.  EYES: Pupils equal, round, reactive to light and accommodation. No scleral icterus. Extraocular muscles intact.  HEENT: Head atraumatic, normocephalic. Oropharynx and nasopharynx clear.  NECK:  Supple, no jugular venous distention. No thyroid enlargement, no tenderness.  LUNGS: Normal breath sounds bilaterally, no wheezing, rales,rhonchi or crepitation. No use of accessory muscles of respiration.  CARDIOVASCULAR: S1, S2 normal. No murmurs, rubs, or gallops.  ABDOMEN: Soft, nontender, nondistended. Bowel sounds present. No organomegaly or mass. PEG tube in place, no infection or bleeding noted around it. EXTREMITIES: No pedal edema, cyanosis, or clubbing.  NEUROLOGIC: Cranial nerves II through XII are intact. Muscle  strength 5/5 in all extremities. Sensation intact. Gait not checked.  PSYCHIATRIC: The patient is alert and oriented x 3.  SKIN: No obvious rash, lesion, or ulcer.   LABORATORY PANEL:   CBC  Recent Labs Lab 05/20/15 1237  WBC 9.8  HGB 12.1*  HCT 36.8*  PLT 144*   ------------------------------------------------------------------------------------------------------------------  Chemistries   Recent Labs Lab 05/20/15 1237  NA 147*  K 4.6  CL 112*  CO2 23  GLUCOSE 113*  BUN 67*  CREATININE 2.28*  CALCIUM 9.4  AST 23  ALT 11*  ALKPHOS 65  BILITOT 0.9    ------------------------------------------------------------------------------------------------------------------  Cardiac Enzymes  Recent Labs Lab 05/20/15 1237  TROPONINI 0.03   ------------------------------------------------------------------------------------------------------------------  RADIOLOGY:  No results found.  EKG:   Orders placed or performed during the hospital encounter of 05/20/15  . ED EKG  . ED EKG    IMPRESSION AND PLAN:   Jeffrey [name]  is a 65 y.o. 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  #1 intractable nausea vomiting- likely underlying infection versus from recent chemotherapy. -IV fluids. Continue tube feeds-dietitian consulted. As patient denies any vomiting after bolus feeds. -Increased free water flushes as needed. Patient also worried about potential esophageal stricture causing the nausea and vomiting. But due to the acute onset, underlying infection-we'll treat conservatively and monitor. If no improvement then might have to consult GI. - UTI-urine cultures ordered. Patient often self catheterizes at home. -Started on Rocephin for now.  #2 acute renal failure on CK D stage III-likely prerenal from dehydration. -IV fluids and continue to monitor. Baseline creatinine seems to be around 1.8 to 2.0  #3 stage IV esophageal cancer-with left neck lymph node positive and also middle third of the esophageal squamous cell carcinoma. Status post radiation. Currently on chemotherapy. -Oncology consulted  #4 hypertension-started on Norvasc orally as patient does not take any home medications.  #5 DVT prophylaxis-started on Lovenox  Discussed with patient. All his questions  answered.    All the records are reviewed and case discussed with ED provider. Management plans discussed with the patient, family and they are in agreement.  CODE STATUS: Full Code  TOTAL TIME TAKING CARE OF THIS PATIENT: 50  minutes.    Gladstone Lighter M.D on 05/20/2015 at 3:28 PM  Between 7am to 6pm - Pager - [phone]  After 6pm go to [email or web address] - password [email or web address] [facility]  [place]  Office  [phone]  CC: Primary care physician; Adin Hector, MD

## 2015-05-20 NOTE — ED Provider Notes (Addendum)
[facility] Emergency Department Provider Note     Time seen: ----------------------------------------- 11:58 AM on 05/20/2015 -----------------------------------------    I have reviewed the triage vital signs and the nursing notes.   HISTORY  Chief Complaint Emesis    HPI Jeffrey [name] is a 65 y.o. male who presents ER for intractable vomiting since yesterday. Patient states his been unable to keep anything down, is currently on chemotherapy and radiation for neck cancer. He denies fevers, chest pain, shortness of breath or diarrhea. Patient is feeling weak, nothing makes his symptoms better.   Past Medical History  Diagnosis Date  . Hypertension   . Bladder cancer (Jeffrey [name])   . Emphysema of lung (Jeffrey [name])   . Lung cancer (Jeffrey [name])   . Chronic kidney disease     Patient Active Problem List   Diagnosis Date Noted  . Squamous cell esophageal cancer (Jeffrey [name]) 05/14/2015  . 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 Esophageal cancer (Jeffrey [name]) 02/04/2015  . Esophageal mass 01/21/2015  . Dehydration 01/21/2015  . CKD (chronic kidney disease) stage 3, GFR 30-59 ml/min 01/21/2015  . Bladder cancer (Jeffrey [name]) 01/21/2015  . Protein-calorie malnutrition, severe 01/21/2015  . Dysphagia 01/20/2015    Past Surgical History  Procedure Laterality Date  . Bladder removal    . Esophagogastroduodenoscopy (egd) with propofol N/A 01/21/2015    Procedure: ESOPHAGOGASTRODUODENOSCOPY (EGD) WITH PROPOFOL-looking in the esophagus stomach and upper small intestine to evaluate and treat;  Surgeon: [name], MD;  Location: [facility] ENDOSCOPY;  Service: Endoscopy;  Laterality: N/A;  . Gastrostomy w/ feeding tube Left     placed end of october 2016  . Portacath placement Right 02/04/2015    Procedure: INSERTION PORT-A-CATH;  Surgeon: [name], MD;  Location: [facility] ORS;  Service: General;  Laterality: Right;  . Lymph node biopsy N/A 02/04/2015    Procedure: LYMPH NODE  BIOPSY;  Surgeon: [name], MD;  Location: [facility] ORS;  Service: General;  Laterality: N/A;    Allergies Aspirin and Taxol  Social History Social History  Substance Use Topics  . Smoking status: Current Every Day Smoker -- 0.50 packs/day    Types: Cigarettes  . Smokeless tobacco: Current User    Types: Chew  . Alcohol Use: No    Review of Systems Constitutional: Negative for fever. Eyes: Negative for visual changes. ENT: Negative for sore throat. Cardiovascular: Negative for chest pain. Respiratory: Negative for shortness of breath. Gastrointestinal: Negative for abdominal pain, positive for vomiting Genitourinary: Negative for dysuria. Musculoskeletal: Negative for back pain. Skin: Negative for rash. Neurological: Negative for headaches, focal weakness or numbness.  10-point ROS otherwise negative.  ____________________________________________   PHYSICAL EXAM:  VITAL SIGNS: ED Triage Vitals  Enc Vitals Group     BP 05/20/15 1147 141/85 mmHg     Pulse Rate 05/20/15 1147 82     Resp 05/20/15 1147 22     Temp 05/20/15 1147 98.3 F (36.8 C)     Temp Source 05/20/15 1147 Oral     SpO2 05/20/15 1147 99 %     Weight 05/20/15 1147 137 lb (62.143 kg)     Height 05/20/15 1147 5\' 7"  (1.702 m)     Head Cir --      Peak Flow --      Pain Score --      Pain Loc --      Pain Edu? --      Excl. in [place]? --     Constitutional: Alert  and oriented. Mild distress Eyes: Conjunctivae are normal. PERRL. Normal extraocular movements. ENT   Head: Normocephalic and atraumatic.   Nose: No congestion/rhinnorhea.   Mouth/Throat: Mucous membranes are moist.   Neck: No stridor. Cardiovascular: Normal rate, regular rhythm. Normal and symmetric distal pulses are present in all extremities. No murmurs, rubs, or gallops. Respiratory: Normal respiratory effort without tachypnea nor retractions. Breath sounds are clear and equal bilaterally. No  wheezes/rales/rhonchi. Gastrointestinal: Soft and nontender. No distention. No abdominal bruits.  Musculoskeletal: Nontender with normal range of motion in all extremities. No joint effusions.  No lower extremity tenderness nor edema. Neurologic:  Normal speech and language. No gross focal neurologic deficits are appreciated. Speech is normal. No gait instability. Skin:  Skin is warm, dry and intact. No rash noted. Psychiatric: Mood and affect are normal. Speech and behavior are normal. Patient exhibits appropriate insight and judgment. ____________________________________________  EKG: Interpreted by me. Normal sinus rhythm with a rate of 77 bpm, normal PR interval, normal QRS, normal QT interval. Nonspecific ST and T-wave changes.  ____________________________________________  ED COURSE:  Pertinent labs & imaging results that were available during my care of the patient were reviewed by me and considered in my medical decision making (see chart for details). Patient is acute on chronically ill. Will receive IV fluids antiemetics. ____________________________________________    LABS (pertinent positives/negatives)  Labs Reviewed  CBC WITH DIFFERENTIAL/PLATELET - Abnormal; Notable for the following:    RBC 3.86 (*)    Hemoglobin 12.1 (*)    HCT 36.8 (*)    RDW 22.0 (*)    Platelets 144 (*)    Neutro Abs 8.9 (*)    Lymphs Abs 0.4 (*)    All other components within normal limits  COMPREHENSIVE METABOLIC PANEL - Abnormal; Notable for the following:    Sodium 147 (*)    Chloride 112 (*)    Glucose, Bld 113 (*)    BUN 67 (*)    Creatinine, Ser 2.28 (*)    Total Protein 9.2 (*)    ALT 11 (*)    GFR calc non Af Amer 29 (*)    GFR calc Af Amer 34 (*)    All other components within normal limits  URINALYSIS COMPLETEWITH MICROSCOPIC ([facility] ONLY) - Abnormal; Notable for the following:    Color, Urine YELLOW (*)    APPearance CLOUDY (*)    Hgb urine dipstick 1+ (*)    Protein, ur 30  (*)    Leukocytes, UA 2+ (*)    Bacteria, UA RARE (*)    Squamous Epithelial / LPF 0-5 (*)    All other components within normal limits  TROPONIN I   ____________________________________________  FINAL ASSESSMENT AND PLAN  Vomiting, weakness, free water deficit, chronic kidney disease, cystitis  Plan: Patient with labs and imaging as dictated above. Patient has around 2 L free water deficit, I will change to D5 W and recommend observation. Patient also has a UTI and a urine culture has been sent. His been started on IV Rocephin as well. He is currently medically stable for admission.   Earleen Newport, MD   Earleen Newport, MD 05/20/15 [place], MD 05/20/15 [phone]

## 2015-05-20 NOTE — ED Notes (Signed)
Pt post void bladder scan; 132mL

## 2015-05-20 NOTE — ED Notes (Signed)
Pt is currently being treated for cancer of the neck, pt reports vomiting since yesterday; unable to keep any fluids down, pt is scheduled for chemotherapy Monday, mask placed on pt

## 2015-05-20 NOTE — Telephone Encounter (Signed)
Per VO Dr Rogue Bussing, advised to take pt to ER. She stated she would take him

## 2015-05-20 NOTE — Telephone Encounter (Signed)
"  Cannot keep anything down, vomiting, pacing the floor, weak, states he doesn't feel good. No pain at this time, completed chemo XRT last Friday" Also reports he has been coughing during night

## 2015-05-20 NOTE — Progress Notes (Signed)
Pharmacy Antibiotic Note  Jeffrey [name] is a 65 y.o. male admitted on 05/20/2015 with UTI.  Pharmacy has been consulted for ceftriaxone dosing.  Plan: Ceftriaxone 1 g IV daily  Height: 5\' 7"  (170.2 cm) Weight: 137 lb (62.143 kg) IBW/kg (Calculated) : 66.1  Temp (24hrs), Avg:98.3 F (36.8 C), Min:98.3 F (36.8 C), Max:98.3 F (36.8 C)   Recent Labs Lab 05/20/15 1237  WBC 9.8  CREATININE 2.28*    Estimated Creatinine Clearance: 29.5 mL/min (by C-G formula based on Cr of 2.28).    Allergies  Allergen Reactions  . Aspirin Nausea Only  . Taxol [Paclitaxel] Other (See Comments)    Diaphoretic, flushing and tightness in chest    Antimicrobials this admission: ceftriaxone 2/15 >>   Microbiology results: 2/15 UCx: Sent   Thank you for allowing pharmacy to be a part of this patient's care.  Lenis Noon, PharmD Clinical Pharmacist 05/20/2015 3:30 PM

## 2015-05-20 NOTE — Progress Notes (Signed)
Initial Nutrition Assessment      INTERVENTION:  EN: Recommend jevity .1.5, 5 cans per day with 53ml water flush before and after feeding. Will provide 1777 kcals, 76 g of protein, 931ml water from tube feeding. Recommend 95ml water flush before and after feeding (5 times per day) providing additional 669ml water. May need to adjust free water prior to discharge pending intake and renal labs. Discussed with Dr Tressia Miners.   NUTRITION DIAGNOSIS:   Swallowing difficulty related to cancer and cancer related treatments as evidenced by  (consult to start enteral nutrition via PEG).    GOAL:   Patient will meet greater than or equal to 90% of their needs    MONITOR:    (Energy intake, Digestive system, Electrolyte and renal profile)  REASON FOR ASSESSMENT:   Consult Enteral/tube feeding initiation and management  ASSESSMENT:      Pt admitted with nausea, vomiting when drinking water orally per MD.  No issues with tube feeding intolerance at this time.    Past Medical History  Diagnosis Date  . Hypertension   . Bladder cancer [facility])     s/p surgery and reconstruction  . Emphysema of lung ([place])   . Esophageal cancer ([place])   . Chronic kidney disease     stage 3    Current Nutrition: NPO, does take water orally per MD but having vomiting recently   Food/Nutrition-Related History: This Probation officer saw pt on 03/13/2015 as outpatient and eating soups, mashed potatoes, etc and taking tube feeding   Scheduled Medications:  . amLODipine  5 mg Oral Daily  . morphine        Continuous Medications:  . [START ON 05/21/2015] cefTRIAXone (ROCEPHIN)  IV       Electrolyte/Renal Profile and Glucose Profile:   Recent Labs Lab 05/20/15 1237  NA 147*  K 4.6  CL 112*  CO2 23  BUN 67*  CREATININE 2.28*  CALCIUM 9.4  GLUCOSE 113*   Protein Profile:  Recent Labs Lab 05/20/15 1237  ALBUMIN 4.4    Gastrointestinal Profile: Last BM: unknown at this time   Nutrition-Focused  Physical Exam Findings: deferred at this time   Weight Change: noted wt gain to 137pounds from 132 pounds on 03/13/2015 at outpt visit .  Diet Order:   NPO  Skin:   reviewed   Height:   Ht Readings from Last 1 Encounters:  05/20/15 5\' 7"  (1.702 m)    Weight:   Wt Readings from Last 1 Encounters:  05/20/15 137 lb (62.143 kg)    Ideal Body Weight:     BMI:  Body mass index is 21.45 kg/(m^2).  Estimated Nutritional Needs:   Kcal:  BEE 1378 kcals (IF 1.0-1.2, AF 1.3) [id number] kcals/d.   Protein:  (1.0-1.2 g/kg) 62-74 g/d  Fluid:  (30-70ml/kg) 1860-2147ml/d  EDUCATION NEEDS:   No education needs identified at this time  HIGH Care Level  Rush Salce B. Zenia Resides, [place], [place] (pager) Weekend/On-Call pager [phone])

## 2015-05-21 ENCOUNTER — Encounter: Payer: Self-pay | Admitting: Anesthesiology

## 2015-05-21 DIAGNOSIS — R1310 Dysphagia, unspecified: Secondary | ICD-10-CM

## 2015-05-21 DIAGNOSIS — Z8551 Personal history of malignant neoplasm of bladder: Secondary | ICD-10-CM

## 2015-05-21 DIAGNOSIS — Z923 Personal history of irradiation: Secondary | ICD-10-CM

## 2015-05-21 DIAGNOSIS — I129 Hypertensive chronic kidney disease with stage 1 through stage 4 chronic kidney disease, or unspecified chronic kidney disease: Secondary | ICD-10-CM

## 2015-05-21 DIAGNOSIS — F17210 Nicotine dependence, cigarettes, uncomplicated: Secondary | ICD-10-CM

## 2015-05-21 DIAGNOSIS — N390 Urinary tract infection, site not specified: Secondary | ICD-10-CM

## 2015-05-21 DIAGNOSIS — C770 Secondary and unspecified malignant neoplasm of lymph nodes of head, face and neck: Secondary | ICD-10-CM

## 2015-05-21 DIAGNOSIS — R112 Nausea with vomiting, unspecified: Secondary | ICD-10-CM

## 2015-05-21 DIAGNOSIS — Z931 Gastrostomy status: Secondary | ICD-10-CM

## 2015-05-21 DIAGNOSIS — R630 Anorexia: Secondary | ICD-10-CM

## 2015-05-21 DIAGNOSIS — C154 Malignant neoplasm of middle third of esophagus: Secondary | ICD-10-CM

## 2015-05-21 DIAGNOSIS — J439 Emphysema, unspecified: Secondary | ICD-10-CM

## 2015-05-21 DIAGNOSIS — I10 Essential (primary) hypertension: Secondary | ICD-10-CM

## 2015-05-21 DIAGNOSIS — Z79899 Other long term (current) drug therapy: Secondary | ICD-10-CM

## 2015-05-21 DIAGNOSIS — N183 Chronic kidney disease, stage 3 (moderate): Secondary | ICD-10-CM

## 2015-05-21 DIAGNOSIS — Z9221 Personal history of antineoplastic chemotherapy: Secondary | ICD-10-CM

## 2015-05-21 LAB — CBC
HCT: 33.3 % — ABNORMAL LOW (ref 40.0–52.0)
Hemoglobin: 11.1 g/dL — ABNORMAL LOW (ref 13.0–18.0)
MCH: 31.7 pg (ref 26.0–34.0)
MCHC: 33.4 g/dL (ref 32.0–36.0)
MCV: 95 fL (ref 80.0–100.0)
PLATELETS: 114 10*3/uL — AB (ref 150–440)
RBC: 3.5 MIL/uL — AB (ref 4.40–5.90)
RDW: 22.1 % — ABNORMAL HIGH (ref 11.5–14.5)
WBC: 8.4 10*3/uL (ref 3.8–10.6)

## 2015-05-21 LAB — BASIC METABOLIC PANEL
Anion gap: 6 (ref 5–15)
BUN: 50 mg/dL — ABNORMAL HIGH (ref 6–20)
CALCIUM: 8.8 mg/dL — AB (ref 8.9–10.3)
CHLORIDE: 118 mmol/L — AB (ref 101–111)
CO2: 24 mmol/L (ref 22–32)
CREATININE: 2.03 mg/dL — AB (ref 0.61–1.24)
GFR calc non Af Amer: 33 mL/min — ABNORMAL LOW (ref >60)
GFR, EST AFRICAN AMERICAN: 39 mL/min — AB (ref >60)
Glucose, Bld: 164 mg/dL — ABNORMAL HIGH (ref 65–99)
Potassium: 4.5 mmol/L (ref 3.5–5.1)
SODIUM: 148 mmol/L — AB (ref 135–145)

## 2015-05-21 MED ORDER — AMLODIPINE BESYLATE 5 MG PO TABS
5.0000 mg | ORAL_TABLET | Freq: Every day | ORAL | Status: DC
  Administered 2015-05-21: 5 mg
  Filled 2015-05-21: qty 1

## 2015-05-21 MED ORDER — ENOXAPARIN SODIUM 30 MG/0.3ML ~~LOC~~ SOLN: 30.0000 mg | SUBCUTANEOUS | Status: DC

## 2015-05-21 MED ORDER — ENOXAPARIN SODIUM 40 MG/0.4ML ~~LOC~~ SOLN
40.0000 mg | SUBCUTANEOUS | Status: DC
  Filled 2015-05-21: qty 0.4

## 2015-05-21 MED ORDER — MORPHINE SULFATE (PF) 2 MG/ML IV SOLN
2.0000 mg | Freq: Once | INTRAVENOUS | Status: AC
  Administered 2015-05-21: 2 mg via INTRAMUSCULAR

## 2015-05-21 MED ORDER — CETYLPYRIDINIUM CHLORIDE 0.05 % MT LIQD
7.0000 mL | Freq: Two times a day (BID) | OROMUCOSAL | Status: DC
  Administered 2015-05-21 (×2): 7 mL via OROMUCOSAL

## 2015-05-21 MED ORDER — MORPHINE SULFATE (PF) 2 MG/ML IV SOLN
INTRAVENOUS | Status: AC
  Administered 2015-05-21: 2 mg via INTRAMUSCULAR
  Filled 2015-05-21: qty 1

## 2015-05-21 MED ORDER — AMLODIPINE BESYLATE 10 MG PO TABS
10.0000 mg | ORAL_TABLET | Freq: Every day | ORAL | Status: DC
  Administered 2015-05-22 – 2015-05-23 (×2): 10 mg
  Filled 2015-05-21 (×2): qty 1

## 2015-05-21 MED ORDER — CHLORHEXIDINE GLUCONATE 0.12 % MT SOLN
15.0000 mL | Freq: Two times a day (BID) | OROMUCOSAL | Status: DC
  Administered 2015-05-22 – 2015-05-23 (×2): 15 mL via OROMUCOSAL
  Filled 2015-05-21 (×2): qty 15

## 2015-05-21 MED ORDER — AMLODIPINE BESYLATE 5 MG PO TABS: 5.0000 mg | ORAL_TABLET | Freq: Once | ORAL | Status: DC

## 2015-05-21 NOTE — Progress Notes (Signed)
Patient crcl has improved to >7ml/min, therefore per protocol, will increase lovenox dose to 40mg  daily.  Ramond Dial, Pharm.D Clinical Pharmacist

## 2015-05-21 NOTE — Care Management (Signed)
Admitted to this facility with the diagnosis of nausea/voitting (esophageal cancer). Lives with wife, Hassan Rowan [phone]). Diagnosed with esophageal cancr 01/18/15. PEG was placed. Jevity feedings established.  Followed by Dr. Rogue Bussing at the cancer center. Next chemotherapy treatment is scheduled for 05/25/15. Radiation in progress. Primary care physician is Dr. Ramonita Lab. Wife helps as needed with basic activities of daily living. Followed by [facility] [facility] for feeds. Oncology consult. Rocephin IV Shelbie Ammons RN MSN CCM Care Management [phone]

## 2015-05-21 NOTE — Progress Notes (Signed)
Order to wean pt from O2.  Pt at 94 % on 2 L and talking on phone.  Decreased O2 to 1 liter/min.  Pt on continuous O2 monitoring. Dorna Bloom RN

## 2015-05-21 NOTE — Progress Notes (Signed)
Pt with large, white, foamy emesis.  Gave prn zofran IVP and pt asking for pain medication.  It is too soon for Oxycodone 5 mg prn every 8 hours .  Called and spoke with Dr Doy Hutching with order for 2 mg IVP Morphine x 1. Dorna Bloom RN

## 2015-05-21 NOTE — Consult Note (Signed)
Patient scheduled for EGD tomorrow morning due to dysphagia worsening, known hx of esoph cancer with chemo and radiation. Discussed with him and his wife.

## 2015-05-21 NOTE — Progress Notes (Signed)
[facility] [place] NAME: Jeffrey [name]    MR#:  [id number]  DATE OF BIRTH:  03-Nov-1950  SUBJECTIVE:  CHIEF COMPLAINT:  Patient is reporting nausea but denies any vomiting. Last tube feed was set at 5 AM today as reported by the patient. Denies any abdominal pain no other complaints. No family members at bedside  REVIEW OF SYSTEMS:  CONSTITUTIONAL: No fever, fatigue or weakness.  EYES: No blurred or double vision.  EARS, NOSE, AND THROAT: No tinnitus or ear pain.  RESPIRATORY: No cough, shortness of breath, wheezing or hemoptysis.  CARDIOVASCULAR: No chest pain, orthopnea, edema.  GASTROINTESTINAL: Reports nausea, denies vomiting, diarrhea or abdominal pain.  GENITOURINARY: No dysuria, hematuria.  ENDOCRINE: No polyuria, nocturia,  HEMATOLOGY: No anemia, easy bruising or bleeding SKIN: No rash or lesion. MUSCULOSKELETAL: No joint pain or arthritis.   NEUROLOGIC: No tingling, numbness, weakness.  PSYCHIATRY: No anxiety or depression.   DRUG ALLERGIES:   Allergies  Allergen Reactions  . Aspirin Nausea Only  . Taxol [Paclitaxel] Other (See Comments)    Diaphoretic, flushing and tightness in chest    VITALS:  Blood pressure 159/79, pulse 74, temperature 98.6 F (37 C), temperature source Oral, resp. rate 18, height 5\' 7"  (1.702 m), weight 58.605 kg (129 lb 3.2 oz), SpO2 100 %.  PHYSICAL EXAMINATION:  GENERAL:  65 y.o.-year-old patient lying in the bed with no acute distress.  EYES: Pupils equal, round, reactive to light and accommodation. No scleral icterus. Extraocular muscles intact.  HEENT: Head atraumatic, normocephalic. Oropharynx and nasopharynx clear.  NECK:  Supple, no jugular venous distention. No thyroid enlargement, no tenderness.  LUNGS: Normal breath sounds bilaterally, no wheezing, rales,rhonchi or crepitation. No use of accessory muscles of respiration.  CARDIOVASCULAR: S1, S2 normal. No murmurs, rubs, or  gallops.  ABDOMEN: Soft, nontender, nondistended. Bowel sounds present. No rebound tenderness EXTREMITIES: No pedal edema, cyanosis, or clubbing.  NEUROLOGIC: Cranial nerves II through XII are intact. Muscle strength 5/5 in all extremities. Sensation intact. Gait not checked.  PSYCHIATRIC: The patient is alert and oriented x 3.  SKIN: No obvious rash, lesion, or ulcer.    LABORATORY PANEL:   CBC  Recent Labs Lab 05/21/15 0602  WBC 8.4  HGB 11.1*  HCT 33.3*  PLT 114*   ------------------------------------------------------------------------------------------------------------------  Chemistries   Recent Labs Lab 05/20/15 1237 05/21/15 0602  NA 147* 148*  K 4.6 4.5  CL 112* 118*  CO2 23 24  GLUCOSE 113* 164*  BUN 67* 50*  CREATININE 2.28* 2.03*  CALCIUM 9.4 8.8*  AST 23  --   ALT 11*  --   ALKPHOS 65  --   BILITOT 0.9  --    ------------------------------------------------------------------------------------------------------------------  Cardiac Enzymes  Recent Labs Lab 05/20/15 1237  TROPONINI 0.03   ------------------------------------------------------------------------------------------------------------------  RADIOLOGY:  No results found.  EKG:   Orders placed or performed during the hospital encounter of 05/20/15  . ED EKG  . ED EKG    ASSESSMENT AND PLAN:    Nathian Calahan is a 65 y.o. 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  #1 intractable nausea vomiting with dysphagia- likely underlying infection versus Unlikely from recent chemotherapy per Dr. Donella Stade ,per dr.Brahmandi's discussion Scheduled for EGD tomorrow  by Dr. Vira Agar , appreciate GI recommendations -Continue  nothing by mouth after midnight  -Resume tube feeds in the meanwhile. Patient takes ice chips by mouth at home will continue the same as tolerated - UTI-urine cultures ordered. Patient often self catheterizes at home. - on Rocephin for now.  #2 acute renal failure on CK D stage III-likely prerenal from dehydration improving withfluids and continue to monitor.  -Baseline creatinine seems to be around 1.8 to 2.0-- 2.03 today  #3 stage IV esophageal cancer-with left neck lymph node positive and also middle third of the esophageal squamous cell carcinoma. Status post radiation. Currently on chemotherapy. -Oncology consulted  #4 hypertension-on Norvasc orally as patient does not take any home medications.  #5 DVT prophylaxis-started on Lovenox    All the records are reviewed and case discussed with Care Management/Social Workerr. Management plans discussed with the patient, family and they are in agreement.  CODE STATUS: fc  TOTAL TIME TAKING CARE OF THIS PATIENT: 76minutes.   POSSIBLE D/C IN 2-3 DAYS, DEPENDING ON CLINICAL CONDITION.   [name] M.D on 05/21/2015 at 12:32 PM  Between 7am to 6pm - Pager - [phone] After 6pm go to [email or web address] - password [email or web address] [facility]  [facility]  Office  [phone]  CC: Primary care physician; Adin Hector, MD

## 2015-05-21 NOTE — Consult Note (Signed)
Consultation  Referring Provider: Dr.Brahmanday Primary Care Physician:  Adin Hector, MD Consulting  Gastroenterologist:  Dr. Gaylyn Cheers       Reason for Consultation: Esophageal cancer with worsening dysphagia and N/V            HPI:   Jeffrey [name] is a 65 y.o. male  with a known hx of metastatic stage IV SCC middle esophagus (dx 01/2015, Dr. Donnella Sham), completed chemo and radiation last Wednesday and about to start another chemo regimen on Monday, on PEG tube 2/2 dysphagia (for solids; liquids taken PO); bladder Ca, s/p neobladder in 2012; CKD stage III, and HTN.  He presented to [facility] ED on 05/20/15 after waking up with nausea and vomiting.  He has not needed to take any nausea medications throughout chemo and radiation. He had nausea and threw up all liquids he would try to ingest on 2/15.  Tube feedings were not a problem - he did not vomit after those.  He felt that the liquids made it down to his stomach without getting stuck, but they came right back up.  He has had dysphagia since before his dx in October, but after beginning txt for Ca, he was able to go from drinking tiny sips to being able to drink a fair amount of liquid at one time. He states that recently he has gotten back to only being able to take in small sips.     During this visit, he was dx with a UTI and was started on abx.  He was seen by oncology, who recommended GI consult with possible repeat of upper endoscopy to evaluate worsening dysphagia. Today (2/16) he is feeling less nauseas and he has been NPO for the day, but he is still having dry heaves.  Tube feeds continue to go fine.  His weight has been steady and even increasing until last week.  He says his hospital weight is down 10 lbs from his weight last week.  He has had more fatigue starting yesterday.  He denies GERD, abdominal pain, or hematemesis.  He has no hx of NSAID use. He also denies diarrhea, hematochezia, or melena.  He takes a laxative every  other day to prevent constipation; he cannot remember the name of it - possibly Miralax.  He is having 1 BM/day.    His last/only upper endoscopy was in 01/21/15, when he was dx with esophageal Ca (Dr. Donnella Sham).   Endoscopy (01/21/15, Dr. Donnella Sham): There was a large amount of barium and mucus with possible food debris. Multiple rinses were done , this material being very difficult to clear. Some pill and food debris was removed with a roth net, There is friable tissue beneath this material. Some of this material is likely eschar/debris also from the underlying mass. 3 passes were taken with a cold forceps to obtain tissue from the lesion. Each site was observed to assure/evaluate hemostasis, which was good. A very narrow area on one side of the mass was noted, possibly the narrow opening noted on imaging, however due to the nature of the lesion I did not attempt to pass this. Pathology: ESOPHAGUS MASS; COLD BIOPSY: INVASIVE SQUAMOUS CELL CARCINOMA, KERATINIZING TYPE   He denies tongue or throat burning. White coating on his tongue- but he has not brushed his teeth for 2 days. He had a tube feeding given at 09:30 today and finished about 11:00. He was seen by Dawson Bills NP and the PA student.  Chrys Racer  E. Howell-Methvin, MT (ASCP)CM ?PA-S M.S. Physician Assistant [place], 2017 [email or web address] Phone: [phone]  Past Medical History  Diagnosis Date  . Hypertension   . Bladder cancer [facility])     s/p surgery and reconstruction  . Emphysema of lung ([place])   . Esophageal cancer ([place])   . Chronic kidney disease     stage 3    Past Surgical History  Procedure Laterality Date  . Bladder removal    . Esophagogastroduodenoscopy (egd) with propofol N/A 01/21/2015    Procedure: ESOPHAGOGASTRODUODENOSCOPY (EGD) WITH PROPOFOL-looking in the esophagus stomach and upper small intestine to evaluate and treat;  Surgeon: [name], MD;  Location: [facility] ENDOSCOPY;  Service: Endoscopy;   Laterality: N/A;  . Gastrostomy w/ feeding tube Left     placed end of october 2016  . Portacath placement Right 02/04/2015    Procedure: INSERTION PORT-A-CATH;  Surgeon: [name], MD;  Location: [facility] ORS;  Service: General;  Laterality: Right;  . Lymph node biopsy N/A 02/04/2015    Procedure: LYMPH NODE BIOPSY;  Surgeon: [name], MD;  Location: [facility] ORS;  Service: General;  Laterality: N/A;    Family History  Problem Relation Age of Onset  . CAD Father   . Breast cancer Mother      Social History  Substance Use Topics  . Smoking status: Current Every Day Smoker -- 0.50 packs/day    Types: Cigarettes  . Smokeless tobacco: Current User    Types: Chew  . Alcohol Use: No     Comment: quit drinking 3 months ago    Prior to Admission medications   Medication Sig Start Date End Date Taking? Authorizing Provider  lidocaine-prilocaine (EMLA) cream Apply cream 1 hour before chemotherapy treatment, place a small amount of saran wrap over the cream to protect your clothing 02/10/15  Yes Cammie Sickle, MD  neomycin-bacitracin-polymyxin (NEOSPORIN) OINT Apply 1 application topically daily. 01/24/15  Yes Vishwanath Hande, MD  Nutritional Supplements (FEEDING SUPPLEMENT, JEVITY 1.5 CAL/FIBER,) LIQD Place 237 mLs into feeding tube 6 (six) times daily. 01/24/15  Yes Vishwanath Hande, MD  oxyCODONE (ROXICODONE) 5 MG/5ML solution Place 5 mLs (5 mg total) into feeding tube every 8 (eight) hours as needed for severe pain. 05/08/15  Yes Cammie Sickle, MD  diphenoxylate-atropine (LOMOTIL) 2.5-0.025 MG tablet Take 1 tablet by mouth 4 (four) times daily as needed for diarrhea or loose stools. Also take 1 pills 30-60 minutes prior to starting chemotherapy/week cycle. Patient not taking: Reported on 05/20/2015 05/14/15   Cammie Sickle, MD  ondansetron (ZOFRAN) 8 MG tablet Take 1 tablet (8 mg total) by mouth every 8 (eight) hours as needed for nausea or vomiting (start 3 days; after  chemo). Patient not taking: Reported on 05/20/2015 05/14/15   Cammie Sickle, MD  prochlorperazine (COMPAZINE) 10 MG tablet Take 1 tablet (10 mg total) by mouth every 6 (six) hours as needed for nausea or vomiting. Patient not taking: Reported on 05/20/2015 05/14/15   Cammie Sickle, MD    Current Facility-Administered Medications  Medication Dose Route Frequency Provider Last Rate Last Dose  . 0.9 %  sodium chloride infusion   Intravenous Continuous Gladstone Lighter, MD 100 mL/hr at 05/21/15 1059    . acetaminophen (TYLENOL) tablet 650 mg  650 mg Oral Q6H PRN Gladstone Lighter, MD       Or  . acetaminophen (TYLENOL) suppository 650 mg  650 mg Rectal Q6H PRN Gladstone Lighter, MD      . [  START ON 05/22/2015] amLODipine (NORVASC) tablet 10 mg  10 mg Per Tube Daily Aruna Gouru, MD      . amLODipine (NORVASC) tablet 5 mg  5 mg Oral Once [name], MD   5 mg at 05/21/15 1323  . antiseptic oral rinse (CPC / CETYLPYRIDINIUM CHLORIDE 0.05%) solution 7 mL  7 mL Mouth Rinse q12n4p [name], MD   7 mL at 05/21/15 1223  . cefTRIAXone (ROCEPHIN) 1 g in dextrose 5 % 50 mL IVPB  1 g Intravenous Q24H Lenis Noon, RPH   1 g at 05/21/15 1318  . chlorhexidine (PERIDEX) 0.12 % solution 15 mL  15 mL Mouth Rinse BID [name], MD   15 mL at 05/21/15 1230  . enoxaparin (LOVENOX) injection 40 mg  40 mg Subcutaneous Q24H Aruna Gouru, MD      . feeding supplement (JEVITY 1.5 CAL/FIBER) liquid 237 mL  237 mL Per Tube 6 X Daily Aruna Gouru, MD   237 mL at 05/21/15 1312  . ondansetron (ZOFRAN) tablet 4 mg  4 mg Oral Q6H PRN Gladstone Lighter, MD       Or  . ondansetron (ZOFRAN) injection 4 mg  4 mg Intravenous Q6H PRN Gladstone Lighter, MD   4 mg at 05/21/15 0900  . oxyCODONE (ROXICODONE) 5 MG/5ML solution 5 mg  5 mg Per Tube Q8H PRN Gladstone Lighter, MD   5 mg at 05/21/15 [id number]   Facility-Administered Medications Ordered in Other Encounters  Medication Dose Route Frequency Provider Last Rate Last Dose   . sodium chloride 0.9 % injection 10 mL  10 mL Intracatheter PRN Cammie Sickle, MD   10 mL at 03/04/15 0933    Allergies as of 05/20/2015 - Review Complete 05/20/2015  Allergen Reaction Noted  . Aspirin Nausea Only 01/20/2015  . Taxol [paclitaxel] Other (See Comments) 03/04/2015     Review of Systems:    A 12 system review was obtained and all negative except where noted in HPI.    Physical Exam:  Vital signs in last 24 hours: Temp:  [98.5 F (36.9 C)-98.8 F (37.1 C)] 98.5 F (36.9 C) (02/16 1310) Pulse Rate:  [67-79] 67 (02/16 1310) Resp:  [12-20] 18 (02/16 1310) BP: (132-170)/(78-116) 132/86 mmHg (02/16 1310) SpO2:  [100 %] 100 % (02/16 1310) Weight:  [58.605 kg (129 lb 3.2 oz)-65.817 kg (145 lb 1.6 oz)] 58.605 kg (129 lb 3.2 oz) (02/16 0933) Last BM Date: 05/20/15  General:  Thin and chronically ill looking male, an no acute distress Head:  Head without obvious abnormality, atraumatic  Eyes:   Conjunctiva pink, sclera anicteric   ENT:   Tongue white coated, poor dentition, could not see post pharynx  Neck:   Supple with adenopathy  Lungs: Clear to auscultation bilaterally, respirations unlabored Heart:     Normal S1S2, no rubs, murmurs, gallops. Abdomen: Soft, non-tender, no hepatosplenomegaly, hernia, or mass and BS norma. Tube feeding in place-site OK. Rectal: Deferred Lymph:  No cervical or supraclavicular adenopathy. Extremities:   No edema, cyanosis, or clubbing Skin  Skin color, texture, turgor normal, no rashes or lesions Neuro:  A&O x 3. CNII-XII intact, normal strength Psych:  Appropriate mood and affect.  Data Reviewed:  LAB RESULTS:  Recent Labs  05/20/15 1237 05/21/15 0602  WBC 9.8 8.4  HGB 12.1* 11.1*  HCT 36.8* 33.3*  PLT 144* 114*   BMET  Recent Labs  05/20/15 1237 05/21/15 0602  NA 147* 148*  K 4.6 4.5  CL  112* 118*  CO2 23 24  GLUCOSE 113* 164*  BUN 67* 50*  CREATININE 2.28* 2.03*  CALCIUM 9.4 8.8*   LFT  Recent  Labs  05/20/15 1237  PROT 9.2*  ALBUMIN 4.4  AST 23  ALT 11*  ALKPHOS 65  BILITOT 0.9   PT/INR No results for input(s): LABPROT, INR in the last 72 hours.  STUDIES: No results found.   Assessment:  Jeffrey [name] is a 65 y.o. with a known hx of metastatic stage IV SCC middle esophagus (dx 01/2015, Dr. Donnella Sham), completed chemo and radiation last Wednesday and about to start another chemo regimen on Monday, on PEG tube 2/2 dysphagia (for solids; liquids taken PO); bladder Ca, s/p neobladder in 2012; CKD stage III, and HTN.  He has a UTI on antibiotics.   Plan:  Etiology for acute on chronic dysphagia  to include candida esophagitis, radiation irritation/stricture, malignancy. Plan  for NPO after MN and EGD tomorrow morning since he had recent tube feeds today.  This case was discussed with Dr. Manya Silvas in collaboration of care. Thank you for the consultation.  These services provided by Denice Paradise RN, MSN, ANP-BC under collaborative practice agreement with Manya Silvas, MD.  05/21/2015, 1:25 PM

## 2015-05-21 NOTE — Progress Notes (Signed)
Nutrition Follow-up  DOCUMENTATION CODES:   Severe malnutrition in context of chronic illness  INTERVENTION:   EN: Per orders, MD ordered Jevity 1.5, 6 boluses a day to provide 2133kcals, 91g protein and with free water 80mL before and after each bolus a total of 1629mL of free water. Currently pt remains on IVF, will adjust as needed. Pt reports taking 4 cans of TF at home and 2-4 bottles of Ensure daily. RN Malka reports pt reports nausea but tolerating TF boluses; continue to monitor and assess with each administration as pt reports vomitting PTA.   NUTRITION DIAGNOSIS:   Swallowing difficulty related to cancer and cancer related treatments as evidenced by  (consult to start enteral nutrition via PEG)  GOAL:   Patient will meet greater than or equal to 90% of their needs; ongoing  MONITOR:    (Energy intake, Digestive system, Electrolyte and renal profile)  REASON FOR ASSESSMENT:   Consult Enteral/tube feeding initiation and management  ASSESSMENT:    Pt c/o nausea this am but reports tolerating TF bolus well this am.   Diet Order:  Diet NPO time specified Except for: Ice Chips    Current Nutrition: Pt had received one bolus this am and reports tolerating very well, however also c/o nausea.  Food/Nutrition-Related History: Pt reports thinking he took 4 cans of Jevity 1.5 yesterday. Pt reports vomitting started yesterday and reports he did not vomit TF. Pt reports usually taking 4 cans of TF and 2-4 bottles of Ensure Plus or Boost High Protein via PEG daily and had been tolerating well. Pt reports    Scheduled Medications:  . [START ON 05/22/2015] amLODipine  10 mg Per Tube Daily  . amLODipine  5 mg Per Tube Daily  . antiseptic oral rinse  7 mL Mouth Rinse q12n4p  . cefTRIAXone (ROCEPHIN)  IV  1 g Intravenous Q24H  . chlorhexidine  15 mL Mouth Rinse BID  . enoxaparin (LOVENOX) injection  40 mg Subcutaneous Q24H  . feeding supplement (JEVITY 1.5 CAL/FIBER)  237 mL Per  Tube 6 X Daily    Continuous Medications:  . sodium chloride 100 mL/hr at 05/21/15 1059     Electrolyte/Renal Profile and Glucose Profile:   Recent Labs Lab 05/20/15 1237 05/21/15 0602  NA 147* 148*  K 4.6 4.5  CL 112* 118*  CO2 23 24  BUN 67* 50*  CREATININE 2.28* 2.03*  CALCIUM 9.4 8.8*  GLUCOSE 113* 164*   Protein Profile:   Recent Labs Lab 05/20/15 1237  ALBUMIN 4.4    Gastrointestinal Profile: Last BM: 05/20/2015   Nutrition-Focused Physical Exam Findings: Nutrition-Focused physical exam completed. Findings are mild-severe fat depletion, moderate-severe muscle depletion, and no edema.     Weight Change: Pt reports weight last week or so of 139lbs at [facility]. Pt weight per [facility] encounters 137lbs one month ago (6% weight loss in one month)   Skin:  Reviewed, no issues    Height:   Ht Readings from Last 1 Encounters:  05/21/15 5\' 7"  (1.702 m)    Weight:   Wt Readings from Last 1 Encounters:  05/21/15 129 lb 3.2 oz (58.605 kg)   Wt Readings from Last 10 Encounters:  05/21/15 129 lb 3.2 oz (58.605 kg)  05/13/15 137 lb 12.6 oz (62.5 kg)  04/29/15 135 lb 2.3 oz (61.3 kg)  04/21/15 137 lb 14.4 oz (62.55 kg)  04/15/15 137 lb 2 oz (62.2 kg)  03/25/15 136 lb 0.4 oz (61.7 kg)  02/25/15 126 lb  8.7 oz (57.4 kg)  02/11/15 125 lb 12.4 oz (57.05 kg)  02/04/15 128 lb (58.06 kg)  02/02/15 128 lb 3.2 oz (58.15 kg)    BMI:  Body mass index is 20.23 kg/(m^2).  Estimated Nutritional Needs:   Kcal:  BEE 1378 kcals (IF 1.0-1.2, AF 1.3) [id number] kcals/d.   Protein:  (1.0-1.2 g/kg) 62-74 g/d  Fluid:  (30-51ml/kg) 1860-2158ml/d  EDUCATION NEEDS:   No education needs identified at this time   [place], RD, LDN Pager [phone] Weekend/On-Call Pager [phone]

## 2015-05-21 NOTE — Plan of Care (Signed)
Problem: Pain Managment: Goal: General experience of comfort will improve Outcome: Progressing Oxycodone given once for generalized pain with good effect.  Problem: Physical Regulation: Goal: Ability to maintain clinical measurements within normal limits will improve Outcome: Progressing VSS. NPO after midnight. Plan for EGD tomorrow in am.  Problem: Nutrition: Goal: Adequate nutrition will be maintained Outcome: Progressing Zofran given once for nausea with improvement. Bolus feeding via PEG continues. Tolerates well.

## 2015-05-21 NOTE — Anesthesia Preprocedure Evaluation (Addendum)
Anesthesia Evaluation  Patient identified by MRN, date of birth, ID band Patient awake    Reviewed: Allergy & Precautions, NPO status , Patient's Chart, lab work & pertinent test results, reviewed documented beta blocker date and time   Airway Mallampati: II  TM Distance: >3 FB     Dental  (+) Chipped   Pulmonary COPD, Current Smoker,    + rhonchi        Cardiovascular hypertension, Pt. on medications      Neuro/Psych    GI/Hepatic   Endo/Other    Renal/GU Renal disease     Musculoskeletal   Abdominal   Peds  Hematology   Anesthesia Other Findings Esophageal mass. Bladder Ca. COPD. CKD. Gastric feeding. K 4.5. Anemic, Hb 11.0.  Reproductive/Obstetrics                           Anesthesia Physical Anesthesia Plan  ASA: IV  Anesthesia Plan: General   Post-op Pain Management:    Induction: Intravenous  Airway Management Planned: Nasal Cannula and Natural Airway  Additional Equipment:   Intra-op Plan:   Post-operative Plan:   Informed Consent: I have reviewed the patients History and Physical, chart, labs and discussed the procedure including the risks, benefits and alternatives for the proposed anesthesia with the patient or authorized representative who has indicated his/her understanding and acceptance.     Plan Discussed with: CRNA  Anesthesia Plan Comments:        Anesthesia Quick Evaluation

## 2015-05-21 NOTE — Consult Note (Signed)
[place] NOTE  Patient Care Team: Adin Hector, MD as PCP - General (Internal Medicine) Clent Jacks, RN as Registered Nurse  CHIEF COMPLAINTS/PURPOSE OF CONSULTATION: Esophageal cancer with nausea vomiting  HISTORY OF PRESENTING ILLNESS:  Jeffrey [name] 65 y.o.  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 Restaging PET scan in middle of January 2017- showed stable esophageal lesion/ however worsening left neck adenopathy. Clinically patient noted to have improvement of his swallowing; however continues to use his PEG tube.  Patient had acute infusion reaction to Taxol hence discontinued.   Given the worsening neck adenopathy; patient most recently finished carboplatin AUC 2 along with concurrent radiation to the left neck approximately 5 days ago.  Patient notes to have worsening difficulty swallowing over the last few days. However, his symptoms got worse yesterday- and started to regurgitate his food.   Patient denies any fevers or chills. His poor appetite. He has poor by mouth intake. Denies any abdominal pain. Patient's straight cath himself- given the history of neobladder.   On admission the hospital patient was found to have sodium of 147; creatinine around 2.2 [baseline creatinine around 1.7-2]. Patient was started on antibiotics for possible UTI.   ROS: A complete 10 point review of system is done which is negative except mentioned above in history of present illness  MEDICAL HISTORY:  Past Medical History  Diagnosis Date  . Hypertension   . Bladder cancer [facility])     s/p surgery and reconstruction  . Emphysema of lung ([place])   . Esophageal cancer ([place])   . Chronic kidney disease     stage 3    SURGICAL HISTORY: Past Surgical History   Procedure Laterality Date  . Bladder removal    . Esophagogastroduodenoscopy (egd) with propofol N/A 01/21/2015    Procedure: ESOPHAGOGASTRODUODENOSCOPY (EGD) WITH PROPOFOL-looking in the esophagus stomach and upper small intestine to evaluate and treat;  Surgeon: [name], MD;  Location: [facility] ENDOSCOPY;  Service: Endoscopy;  Laterality: N/A;  . Gastrostomy w/ feeding tube Left     placed end of october 2016  . Portacath placement Right 02/04/2015    Procedure: INSERTION PORT-A-CATH;  Surgeon: [name], MD;  Location: [facility] ORS;  Service: General;  Laterality: Right;  . Lymph node biopsy N/A 02/04/2015    Procedure: LYMPH NODE BIOPSY;  Surgeon: [name], MD;  Location: [facility] ORS;  Service: General;  Laterality: N/A;    SOCIAL HISTORY: Social History   Social History  . Marital Status: Married    Spouse Name: N/A  . Number of Children: N/A  . Years of Education: N/A   Occupational History  . Not on file.   Social History Main Topics  . Smoking status: Current Every Day Smoker -- 0.50 packs/day    Types: Cigarettes  . Smokeless tobacco: Current User    Types: Chew  . Alcohol Use: No     Comment: quit drinking 3 months ago  . Drug Use: No  . Sexual Activity: Not on file   Other Topics Concern  . Not on file   Social History Narrative   Lives at home with wife. Independent at baseline.    FAMILY HISTORY: Family History  Problem Relation Age of Onset  . CAD Father   . Breast  cancer Mother     ALLERGIES:  is allergic to aspirin and taxol.  MEDICATIONS:  Current Facility-Administered Medications  Medication Dose Route Frequency Provider Last Rate Last Dose  . 0.9 %  sodium chloride infusion   Intravenous Continuous Gladstone Lighter, MD 100 mL/hr at 05/21/15 0120    . acetaminophen (TYLENOL) tablet 650 mg  650 mg Oral Q6H PRN Gladstone Lighter, MD       Or  . acetaminophen (TYLENOL) suppository 650 mg  650 mg Rectal Q6H PRN Gladstone Lighter, MD      .  amLODipine (NORVASC) tablet 5 mg  5 mg Oral Daily Gladstone Lighter, MD   5 mg at 05/20/15 1749  . cefTRIAXone (ROCEPHIN) 1 g in dextrose 5 % 50 mL IVPB  1 g Intravenous Q24H Darylene Price Swayne, RPH      . enoxaparin (LOVENOX) injection 40 mg  40 mg Subcutaneous Q24H Aruna Gouru, MD      . feeding supplement (JEVITY 1.5 CAL/FIBER) liquid 237 mL  237 mL Per Tube 6 X Daily Gladstone Lighter, MD   237 mL at 05/21/15 0516  . feeding supplement (JEVITY 1.5 CAL/FIBER) liquid 237 mL  237 mL Per Tube 5 X Daily Gladstone Lighter, MD   237 mL at 05/20/15 1749  . ondansetron (ZOFRAN) tablet 4 mg  4 mg Oral Q6H PRN Gladstone Lighter, MD       Or  . ondansetron (ZOFRAN) injection 4 mg  4 mg Intravenous Q6H PRN Gladstone Lighter, MD   4 mg at 05/21/15 0116  . oxyCODONE (ROXICODONE) 5 MG/5ML solution 5 mg  5 mg Per Tube Q8H PRN Gladstone Lighter, MD   5 mg at 05/21/15 0120   Facility-Administered Medications Ordered in Other Encounters  Medication Dose Route Frequency Provider Last Rate Last Dose  . sodium chloride 0.9 % injection 10 mL  10 mL Intracatheter PRN Cammie Sickle, MD   10 mL at 03/04/15 0933      .  PHYSICAL EXAMINATION:   Filed Vitals:   05/20/15 2024 05/21/15 0434  BP: 145/79 156/78  Pulse: 69 70  Temp:  98.6 F (37 C)  Resp:  18   Filed Weights   05/20/15 1147 05/21/15 0500  Weight: 137 lb (62.143 kg) 145 lb 1.6 oz (65.817 kg)    GENERAL: Thin built moderately nourished African-American male patient Alert, no distress; he is accompanied by his daughter. EYES: no pallor or icterus OROPHARYNX: no thrush or ulceration; poor dentition  NECK: Left neck approximately 3-4 cm masses felt [ no significant improvement] LYMPH:  no palpable lymphadenopathy axillary or inguinal regions LUNGS: clear to auscultation and  No wheeze or crackles HEART/CVS: regular rate & rhythm and no murmurs; No lower extremity edema ABDOMEN: abdomen soft, non-tender and normal bowel sounds; positive for  PEG tube. Musculoskeletal:no cyanosis of digits and no clubbing  PSYCH: alert & oriented x 3 with fluent speech NEURO: no focal motor/sensory deficits SKIN:  no rashes or significant lesions  LABORATORY DATA:  I have reviewed the data as listed Lab Results  Component Value Date   WBC 8.4 05/21/2015   HGB 11.1* 05/21/2015   HCT 33.3* 05/21/2015   MCV 95.0 05/21/2015   PLT 114* 05/21/2015    Recent Labs  05/06/15 0835 05/13/15 0850 05/20/15 1237 05/21/15 0602  NA 135 137 147* 148*  K 4.2 4.5 4.6 4.5  CL 105 106 112* 118*  CO2 23 22 23 24   GLUCOSE 110* 130* 113* 164*  BUN  37* 54* 67* 50*  CREATININE 1.71* 1.99* 2.28* 2.03*  CALCIUM 8.6* 8.7* 9.4 8.8*  GFRNONAA 41* 34* 29* 33*  GFRAA 48* 40* 34* 39*  PROT 7.5 7.7 9.2*  --   ALBUMIN 3.9 4.0 4.4  --   AST 17 16 23   --   ALT 9* 9* 11*  --   ALKPHOS 60 61 65  --   BILITOT 0.3 0.7 0.9  --     ASSESSMENT & PLAN:   65 year old male patient with a history of metastatic esophageal cancer currently on carboplatin-radiation therapy to the left neck currently admitted to hospital for nausea-vomiting/difficulty swallowing.  # Nausea-vomiting/regurgitation with difficult to swallowing- question related to progression of esophageal malignancy. Clinically less likely from chemotherapy [carboplatin AUC 2- given approximately 9 days ago] and radiation [spoke to Dr. Donella Stade; unlikely to cause radiation esophagitis]. Recommend GI evaluation- to evaluate for worsening dysphagia/possible endoscopic evaluation. I have consulted Dr. Gustavo Lah for further evaluation.   # Metastatic squamous cell carcinoma of the esophagus- finished carboplatin radiation therapy to the left neck approximately 5 days ago. No significant improvement of the left neck lymph node mass. Also discussed with the patient/daughter my above concerns in detail. Patient has limited chemotherapy options given his renal insufficiency/ acute Taxol infusion reactions.    # UTI-on  Rocephin; chronic kidney disease with mild worsening-on IV fluids.  Thank you Dr.Gouru for allowing me to participate in the care of your pleasant patient. Please do not hesitate to contact me with questions or concerns in the interim.       Cammie Sickle, MD 05/21/2015 7:57 AM

## 2015-05-22 ENCOUNTER — Inpatient Hospital Stay: Payer: MEDICAID | Admitting: Anesthesiology

## 2015-05-22 ENCOUNTER — Encounter
Admission: EM | Disposition: A | Discharge status: Home Health | Payer: Self-pay | Source: Home / Self Care | Attending: Internal Medicine

## 2015-05-22 ENCOUNTER — Encounter: Payer: Self-pay | Admitting: *Deleted

## 2015-05-22 DIAGNOSIS — K222 Esophageal obstruction: Secondary | ICD-10-CM

## 2015-05-22 DIAGNOSIS — C159 Malignant neoplasm of esophagus, unspecified: Secondary | ICD-10-CM

## 2015-05-22 DIAGNOSIS — N289 Disorder of kidney and ureter, unspecified: Secondary | ICD-10-CM

## 2015-05-22 HISTORY — PX: ESOPHAGOGASTRODUODENOSCOPY: SHX5428

## 2015-05-22 LAB — URINE CULTURE: SPECIAL REQUESTS: NORMAL

## 2015-05-22 SURGERY — ESOPHAGOGASTRODUODENOSCOPY (EGD) WITH PROPOFOL
Anesthesia: General

## 2015-05-22 SURGERY — EGD (ESOPHAGOGASTRODUODENOSCOPY)
Anesthesia: General

## 2015-05-22 MED ORDER — OXYCODONE HCL 5 MG/5ML PO SOLN
5.0000 mg | Freq: Three times a day (TID) | ORAL | Status: DC | PRN
  Administered 2015-05-23: 5 mg
  Filled 2015-05-22: qty 5

## 2015-05-22 MED ORDER — GLYCOPYRROLATE 0.2 MG/ML IJ SOLN
INTRAMUSCULAR | Status: DC | PRN
  Administered 2015-05-22: 0.2 mg via INTRAVENOUS

## 2015-05-22 MED ORDER — MIDAZOLAM HCL 2 MG/2ML IJ SOLN
INTRAMUSCULAR | Status: DC | PRN
Start: 2015-05-22 — End: 2015-05-22
  Administered 2015-05-22: 2 mg via INTRAVENOUS

## 2015-05-22 MED ORDER — CEPHALEXIN 250 MG/5ML PO SUSR
500.0000 mg | Freq: Two times a day (BID) | ORAL | Status: DC
  Administered 2015-05-22 – 2015-05-23 (×2): 500 mg
  Filled 2015-05-22 (×3): qty 10

## 2015-05-22 MED ORDER — PROPOFOL 10 MG/ML IV BOLUS
INTRAVENOUS | Status: DC | PRN
  Administered 2015-05-22: 20 mg via INTRAVENOUS
  Administered 2015-05-22: 80 mg via INTRAVENOUS
  Administered 2015-05-22 (×2): 20 mg via INTRAVENOUS

## 2015-05-22 NOTE — Transfer of Care (Signed)
Immediate Anesthesia Transfer of Care Note  Patient: Jeffrey [name]  Procedure(s) Performed: Procedure(s): ESOPHAGOGASTRODUODENOSCOPY (EGD) (N/A)  Patient Location: PACU  Anesthesia Type:General  Level of Consciousness: sedated  Airway & Oxygen Therapy: Patient Spontanous Breathing and Patient connected to nasal cannula oxygen  Post-op Assessment: Report given to RN and Post -op Vital signs reviewed and stable  Post vital signs: Reviewed and stable  Last Vitals:  Filed Vitals:   05/22/15 0955 05/22/15 1029  BP: 159/98 122/92  Pulse: 73 98  Temp: 37.2 C 36.1 C  Resp: 18 19    Complications: No apparent anesthesia complications

## 2015-05-22 NOTE — OR Nursing (Signed)
labetol @1140  and 1145 for elevated bp. Per dr Micah Flesher. bp down see notes . Report to floor nurse chris, jdanley rn

## 2015-05-22 NOTE — Anesthesia Postprocedure Evaluation (Signed)
Anesthesia Post Note  Patient: Jeffrey [name]  Procedure(s) Performed: Procedure(s) (LRB): ESOPHAGOGASTRODUODENOSCOPY (EGD) (N/A)  Patient location during evaluation: PACU Anesthesia Type: General Level of consciousness: awake and alert Pain management: satisfactory to patient Respiratory status: spontaneous breathing Cardiovascular status: stable Anesthetic complications: no    Last Vitals:  Filed Vitals:   05/22/15 0955 05/22/15 1029  BP: 159/98 122/92  Pulse: 73 98  Temp: 37.2 C 36.1 C  Resp: 18 19    Last Pain:  Filed Vitals:   05/22/15 1029  PainSc: 6                  VAN STAVEREN,Bernetta Sutley

## 2015-05-22 NOTE — Consult Note (Signed)
Patient EGD showed a very tight stricture about 1-69mm opening which I did pass a guide wire into the stomach and then attempted to pass the smallest Savary dilator a 34F and this would not pass.  No further plans for endoscopic treatment. Patient can continue to use ice chips and small sips of water.  All meds and nourishment should go through the feeding tube.

## 2015-05-22 NOTE — Op Note (Signed)
[facility] Gastroenterology Patient Name: Jeffrey [name] Procedure Date: 05/22/2015 10:10 AM MRN: [id number] Account #: [id number] Date of Birth: 1950-07-10 Admit Type: Inpatient Age: 66 Room: [facility] ENDO ROOM 1 Gender: Male Note Status: Finalized Procedure:            Upper GI endoscopy Indications:          Dysphagia, Malignant esophageal tumor Providers:            Manya Silvas, MD Medicines:            Propofol per Anesthesia Complications:        No immediate complications. Procedure:            Pre-Anesthesia Assessment:                       - After reviewing the risks and benefits, the patient                        was deemed in satisfactory condition to undergo the                        procedure.                       After obtaining informed consent, the endoscope was                        passed under direct vision. Throughout the procedure,                        the patient's blood pressure, pulse, and oxygen                        saturations were monitored continuously. The Endoscope                        was introduced through the mouth, and advanced to the                        lower third of esophagus. The upper GI endoscopy was                        accomplished without difficulty. The patient tolerated                        the procedure well. Findings:      One severe malignant-appearing, covered wiith mucosa and an intrinsic       stenosis was found 30 cm from the incisors. This measured 1-2 mm (inner       diameter) and was not traversed. A guidewire was easily passed and the       scope withdrawn and the Savary smallest dilator would not pass (21 F       Savary dilator) Therefore the dilator was withdrawn and procedure ended. Impression:           - Malignant-appearing esophageal stenosis.                       - No specimens collected. Recommendation:       - The findings and recommendations were discussed with         the patient. Gavin Pound  Vira Agar, MD 05/22/2015 10:31:06 AM This report has been signed electronically. Number of Addenda: 0 Note Initiated On: 05/22/2015 10:10 AM      [facility]

## 2015-05-22 NOTE — Progress Notes (Signed)
[place] [facility] [place] NAME: Jeffrey [name]    MR#:  [id number]  DATE OF BIRTH:  02/19/1951  SUBJECTIVE:  CHIEF COMPLAINT:  Patient had EGD today, nausea is better. Denies any abdominal pain no other complaints. No family members at bedside  REVIEW OF SYSTEMS:  CONSTITUTIONAL: No fever, fatigue or weakness.  EYES: No blurred or double vision.  EARS, NOSE, AND THROAT: No tinnitus or ear pain.  RESPIRATORY: No cough, shortness of breath, wheezing or hemoptysis.  CARDIOVASCULAR: No chest pain, orthopnea, edema.  GASTROINTESTINAL: Reports nausea, denies vomiting, diarrhea or abdominal pain.  GENITOURINARY: No dysuria, hematuria.  ENDOCRINE: No polyuria, nocturia,  HEMATOLOGY: No anemia, easy bruising or bleeding SKIN: No rash or lesion. MUSCULOSKELETAL: No joint pain or arthritis.   NEUROLOGIC: No tingling, numbness, weakness.  PSYCHIATRY: No anxiety or depression.   DRUG ALLERGIES:   Allergies  Allergen Reactions  . Aspirin Nausea Only  . Taxol [Paclitaxel] Other (See Comments)    Diaphoretic, flushing and tightness in chest    VITALS:  Blood pressure 160/90, pulse 72, temperature 98.7 F (37.1 C), temperature source Oral, resp. rate 20, height 5\' 7"  (1.702 m), weight 60.782 kg (134 lb), SpO2 100 %.  PHYSICAL EXAMINATION:  GENERAL:  65 y.o.-year-old patient lying in the bed with no acute distress.  EYES: Pupils equal, round, reactive to light and accommodation. No scleral icterus. Extraocular muscles intact.  HEENT: Head atraumatic, normocephalic. Oropharynx and nasopharynx clear.  NECK:  Supple, no jugular venous distention. No thyroid enlargement, no tenderness.  LUNGS: Normal breath sounds bilaterally, no wheezing, rales,rhonchi or crepitation. No use of accessory muscles of respiration.  CARDIOVASCULAR: S1, S2 normal. No murmurs, rubs, or gallops.  ABDOMEN: Soft, nontender, nondistended. Bowel sounds present. No rebound  tenderness EXTREMITIES: No pedal edema, cyanosis, or clubbing.  NEUROLOGIC: Cranial nerves II through XII are intact. Muscle strength 5/5 in all extremities. Sensation intact. Gait not checked.  PSYCHIATRIC: The patient is alert and oriented x 3.  SKIN: No obvious rash, lesion, or ulcer.    LABORATORY PANEL:   CBC  Recent Labs Lab 05/21/15 0602  WBC 8.4  HGB 11.1*  HCT 33.3*  PLT 114*   ------------------------------------------------------------------------------------------------------------------  Chemistries   Recent Labs Lab 05/20/15 1237 05/21/15 0602  NA 147* 148*  K 4.6 4.5  CL 112* 118*  CO2 23 24  GLUCOSE 113* 164*  BUN 67* 50*  CREATININE 2.28* 2.03*  CALCIUM 9.4 8.8*  AST 23  --   ALT 11*  --   ALKPHOS 65  --   BILITOT 0.9  --    ------------------------------------------------------------------------------------------------------------------  Cardiac Enzymes  Recent Labs Lab 05/20/15 1237  TROPONINI 0.03   ------------------------------------------------------------------------------------------------------------------  RADIOLOGY:  No results found.  EKG:   Orders placed or performed during the hospital encounter of 05/20/15  . ED EKG  . ED EKG    ASSESSMENT AND PLAN:    Jeffrey [name] is a 65 y.o. 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  #1 intractable nausea vomiting with dysphagia- likely from acute cystitis Unlikely from recent chemotherapy per Dr. Donella Stade ,per dr.Brahmandi's discussion Patient had EGD today by Dr. Vira Agar , appreciate GI recommendations noticed a stricture, about 1-26mm opening , passed  a  guide wire into the stomach and then attempted to pass the  smallest Savary dilator a 48F and this would not pass. No further plans for endoscopic treatment by GI -Resume tube feeds . Patient takes ice chips by mouth at home will continue the same as tolerated -   #UTI-urine cultures with klebsiella sensitive to Rocephin and Cipro and cefazolin.  Patient often self catheterizes at home. - on Rocephin , changed to by mouth antibiotic  # acute renal failure on CK D stage III-likely prerenal from dehydration improving withfluids and continue to monitor.  -Baseline creatinine seems to be around 1.8 to 2.0-- 2.03 , and BMP in a.m.  #3 stage IV esophageal cancer-with left neck lymph node positive and also middle third of the esophageal squamous cell carcinoma. Status post radiation. Currently on chemotherapy. -Appreciate dr.Brahmandi recommendations  #4 hypertension-on Norvasc orally as patient does not take any home medications.  #5 DVT prophylaxis-started on Lovenox    All the records are reviewed and case discussed with Care Management/Social Workerr. Management plans discussed with the patient, family and they are in agreement.  CODE STATUS: fc  TOTAL TIME TAKING CARE OF THIS PATIENT: 40minutes.   POSSIBLE D/C IN 1-2 DAYS, DEPENDING ON CLINICAL CONDITION.   Jeffrey [name] M.D on 05/22/2015 at 2:27 PM  Between 7am to 6pm - Pager - [phone] After 6pm go to [email or web address] - password [email or web address] [facility]  [facility]  Office  [phone]  CC: Primary care physician; Adin Hector, MD

## 2015-05-22 NOTE — Progress Notes (Signed)
Jeffrey [name]   DOB:Jan 14, 1951   [id number]    Subjective: Continued difficulty swallowing.   Patient had EGD this morning that showed malignant stricture- 3 Mm lumen; no dilatation possible. No biopsies done. He is able to keep down 6 of water/ice chips; he is unable to swallow any food.  Ros: no chest pain or shortness of breath or cough. No fevers.  Objective:  Filed Vitals:   05/22/15 1255 05/22/15 1430  BP: 160/90 161/93  Pulse: 72   Temp: 98.7 F (37.1 C)   Resp: 20      Intake/Output Summary (Last 24 hours) at 05/22/15 1736 Last data filed at 05/22/15 1424  Gross per 24 hour  Intake   1287 ml  Output    850 ml  Net    437 ml    GENERAL: Thin built moderately nourished African-American male patient Alert, no distress; he is accompanied by his wife. EYES: no pallor or icterus OROPHARYNX: no thrush or ulceration; poor dentition  NECK: Left neck approximately 3-4 cm masses felt [ no significant improvement] LYMPH: no palpable lymphadenopathy axillary or inguinal regions LUNGS: clear to auscultation and No wheeze or crackles HEART/CVS: regular rate & rhythm and no murmurs; No lower extremity edema ABDOMEN: abdomen soft, non-tender and normal bowel sounds; positive for PEG tube. Musculoskeletal:no cyanosis of digits and no clubbing  PSYCH: alert & oriented x 3 with fluent speech NEURO: no focal motor/sensory deficits SKIN: no rashes or significant lesions  Labs:  Lab Results  Component Value Date   WBC 8.4 05/21/2015   HGB 11.1* 05/21/2015   HCT 33.3* 05/21/2015   MCV 95.0 05/21/2015   PLT 114* 05/21/2015   NEUTROABS 8.9* 05/20/2015    Lab Results  Component Value Date   NA 148* 05/21/2015   K 4.5 05/21/2015   CL 118* 05/21/2015   CO2 24 05/21/2015    Studies:  No results found.  Assessment & Plan:   # metastatic esophageal cancer progressive dysphagia- based on the EGD findings- concerning for progression of disease in the esophagus; closing the  lumen causing regurgitation.  Patient had recently finished radiation therapy to the esophagus. I had a long discussion with the patient and his wife regarding the difficult situation; where the cancer has been progressively getting worse over a month or so since finishing treatment. Unfortunately his left neck adenopathy has not shown any significant response to the recent radiation therapy.  In terms of options- I would recommend systemic chemotherapy with 5-FU plus irinotecan as patient had no systemic chemotherapy so far.they do understand the response rates are in the range of 20-30% only. They do understand that all treatments are palliative; and eventually patient will die of his malignancy. I also discussed the the median survival of metastatic esophageal cancer is around 8-12 months; which the patient's situation might be lower- given his renal insufficiency/ fairly quickly progression in esophagus post radiation.   # patient was reminded to pick up a prescription for Lomotil/faxed to his pharmacy- and take it prior to his chemotherapy on Monday.  The patient could be discharged from oncology standpoint; he has an appointment to follow-up with me in the [place] on February 20th.  I left a message for Dr.Gouru with the above recommendations.   # 45 minutes face-to-face with the patient/wife discussing the above plan of care; more than 50% of time spent on prognosis/ natural history; counseling and coordination.    Cammie Sickle, MD 05/22/2015  5:36 PM

## 2015-05-22 NOTE — Care Management (Signed)
Patient was not in room when I rounded. Appears to be self-pay. PCP Dr. Ramonita Lab. List of [place] agencies left at bedside.

## 2015-05-22 NOTE — Progress Notes (Signed)
Nutrition Follow-up  DOCUMENTATION CODES:   Severe malnutrition in context of chronic illness  INTERVENTION:   Coordination of Care: continue enteral nutrition via PEG when medically able per GI MD s/p procedure this am. Current regimen of Jevity 1.5 of 6 cans per day, RD notes pt remains on IVF at this time. Will make adjustments as needed.    NUTRITION DIAGNOSIS:   Swallowing difficulty related to cancer and cancer related treatments as evidenced by  (consult to start enteral nutrition via PEG).  GOAL:   Patient will meet greater than or equal to 90% of their needs  MONITOR:    (Energy intake, Digestive system, Electrolyte and renal profile)  REASON FOR ASSESSMENT:   Consult Enteral/tube feeding initiation and management  ASSESSMENT:    Pt s/p EGD this am. TF held at midnight for procedure last night. Per MD note pt with severe stricture on EGD with recommendations to continue medications and nutrition via PEG. Ice chips and small sips of water po only.   Diet Order:  Diet NPO time specified   Current Nutrition: Per MAR pt received a total of 4 cans of Jevity 1.5 yesterday.    Gastrointestinal Profile: RD noted pt with white foamy emesis this am charted per Nsg Last BM: 05/20/2015   Scheduled Medications:  . amLODipine  10 mg Per Tube Daily  . antiseptic oral rinse  7 mL Mouth Rinse q12n4p  . cefTRIAXone (ROCEPHIN)  IV  1 g Intravenous Q24H  . chlorhexidine  15 mL Mouth Rinse BID  . enoxaparin (LOVENOX) injection  40 mg Subcutaneous Q24H  . feeding supplement (JEVITY 1.5 CAL/FIBER)  237 mL Per Tube 6 X Daily    Continuous Medications:  . sodium chloride 100 mL/hr at 05/22/15 1303     Electrolyte/Renal Profile and Glucose Profile:   Recent Labs Lab 05/20/15 1237 05/21/15 0602  NA 147* 148*  K 4.6 4.5  CL 112* 118*  CO2 23 24  BUN 67* 50*  CREATININE 2.28* 2.03*  CALCIUM 9.4 8.8*  GLUCOSE 113* 164*   Protein Profile:  Recent Labs Lab  05/20/15 1237  ALBUMIN 4.4     Weight Trend since Admission: Filed Weights   05/21/15 0500 05/21/15 0933 05/22/15 0500  Weight: 145 lb 1.6 oz (65.817 kg) 129 lb 3.2 oz (58.605 kg) 134 lb (60.782 kg)     Skin:  Reviewed, no issues   BMI:  Body mass index is 20.98 kg/(m^2).  Estimated Nutritional Needs:   Kcal:  BEE 1378 kcals (IF 1.0-1.2, AF 1.3) [id number] kcals/d.   Protein:  (1.0-1.2 g/kg) 62-74 g/d  Fluid:  (30-64ml/kg) 1860-2142ml/d  EDUCATION NEEDS:   No education needs identified at this time   [place], RD, LDN Pager [phone] Weekend/On-Call Pager [phone]

## 2015-05-23 LAB — BASIC METABOLIC PANEL
Anion gap: 5 (ref 5–15)
BUN: 32 mg/dL — ABNORMAL HIGH (ref 6–20)
CHLORIDE: 120 mmol/L — AB (ref 101–111)
CO2: 26 mmol/L (ref 22–32)
CREATININE: 1.85 mg/dL — AB (ref 0.61–1.24)
Calcium: 8.9 mg/dL (ref 8.9–10.3)
GFR calc non Af Amer: 37 mL/min — ABNORMAL LOW (ref >60)
GFR, EST AFRICAN AMERICAN: 43 mL/min — AB (ref >60)
Glucose, Bld: 106 mg/dL — ABNORMAL HIGH (ref 65–99)
POTASSIUM: 3.8 mmol/L (ref 3.5–5.1)
Sodium: 151 mmol/L — ABNORMAL HIGH (ref 135–145)

## 2015-05-23 LAB — CBC
HEMATOCRIT: 34.3 % — AB (ref 40.0–52.0)
HEMOGLOBIN: 11.4 g/dL — AB (ref 13.0–18.0)
MCH: 32.1 pg (ref 26.0–34.0)
MCHC: 33.2 g/dL (ref 32.0–36.0)
MCV: 96.6 fL (ref 80.0–100.0)
Platelets: 99 10*3/uL — ABNORMAL LOW (ref 150–440)
RBC: 3.55 MIL/uL — AB (ref 4.40–5.90)
RDW: 21.7 % — ABNORMAL HIGH (ref 11.5–14.5)
WBC: 8.3 10*3/uL (ref 3.8–10.6)

## 2015-05-23 MED ORDER — CEPHALEXIN 250 MG/5ML PO SUSR: 500.0000 mg | Freq: Two times a day (BID) | ORAL | Status: AC

## 2015-05-23 MED ORDER — AMLODIPINE BESYLATE 10 MG PO TABS: 10.0000 mg | ORAL_TABLET | Freq: Every day | ORAL | Status: DC

## 2015-05-23 NOTE — Progress Notes (Signed)
Patient discharged per MD order. Prescription given to patient. All discharge instructions given and all questions answered. Escorted by daughter and volunteers.

## 2015-05-23 NOTE — Discharge Summary (Signed)
[place] [facility] [place] NAME: Jeffrey [name]    MR#:  [id number]  DATE OF BIRTH:  09-20-1950  DATE OF ADMISSION:  05/20/2015 ADMITTING PHYSICIAN: Gladstone Lighter, MD  DATE OF DISCHARGE: 05/23/2015  PRIMARY CARE PHYSICIAN: Tama High III, MD    ADMISSION DIAGNOSIS:  Dehydration [E86.0] Acute on chronic renal failure (HCC) [N17.9, N18.9] Cystitis [N30.90] Non-intractable vomiting with nausea, vomiting of unspecified type [R11.2]  DISCHARGE DIAGNOSIS:  Active Problems:   Nausea & vomiting   UTI  SECONDARY DIAGNOSIS:   Past Medical History  Diagnosis Date  . Hypertension   . Bladder cancer [facility])     s/p surgery and reconstruction  . Emphysema of lung ([place])   . Esophageal cancer ([place])   . Chronic kidney disease     stage 3    HOSPITAL COURSE:   Jeffrey [name] is a 65 y.o. 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  #1 intractable nausea vomiting with dysphagia- likely from acute cystitis Unlikely from recent chemotherapy per Dr. Donella Stade ,per dr.Brahmandi's discussion Patient had EGD today by Dr. Vira Agar , appreciate GI recommendations noticed a stricture, about 1-26mm opening , passed a guide wire into the stomach and then attempted to pass the smallest Savary dilator a 96F and this would not pass. No further plans for endoscopic treatment by GI -Resume tube feeds . Patient takes ice chips by mouth at home will continue the same as tolerated   He does not have any more nausea now. Tolerating tube feed nicely.  #UTI-urine cultures with klebsiella sensitive to Rocephin and Cipro and cefazolin.  Patient often self catheterizes at home. - on Rocephin , changed  to antibiotic per G tube.  # acute renal failure on CK D stage III-likely prerenal from dehydration improving with fluids and continue to monitor.  -Baseline creatinine seems to be around 1.8 to 2.0.  #3 stage IV esophageal cancer-with left neck lymph node positive and also middle third of the esophageal squamous cell carcinoma. Status post radiation. Currently on chemotherapy. -Appreciate dr.Brahmandi recommendations - pt have appointment in cancer center on Monday.  #4 hypertension-on Norvasc orally as patient does not take any home medications.  #5 DVT prophylaxis-started on Lovenox   DISCHARGE CONDITIONS:   Stable.  CONSULTS OBTAINED:  Treatment Team:  [name], MD Manya Silvas, MD Cammie Sickle, MD  DRUG ALLERGIES:   Allergies  Allergen Reactions  . Aspirin Nausea Only  . Taxol [Paclitaxel] Other (See Comments)    Diaphoretic, flushing and tightness in chest    DISCHARGE MEDICATIONS:   Current Discharge Medication List    START taking these medications   Details  amLODipine (NORVASC) 10 MG tablet Place 1 tablet (10 mg total) into feeding tube daily. Qty: 30 tablet, Refills: 0    cephALEXin (KEFLEX) 250 MG/5ML suspension Place 10 mLs (500 mg total) into feeding tube every 12 (twelve) hours. Qty: 100 mL, Refills: 0      CONTINUE these medications which have NOT CHANGED   Details  lidocaine-prilocaine (EMLA) cream Apply cream 1 hour before chemotherapy treatment, place a small amount of saran wrap over the cream to protect your clothing Qty: 30 g, Refills: 1    neomycin-bacitracin-polymyxin (NEOSPORIN) OINT  Apply 1 application topically daily. Qty: 30 g, Refills: 1    Nutritional Supplements (FEEDING SUPPLEMENT, JEVITY 1.5 CAL/FIBER,) LIQD Place 237 mLs into feeding tube 6 (six) times daily. Qty: 1000 mL, Refills: 5    oxyCODONE (ROXICODONE) 5 MG/5ML solution Place 5 mLs (5 mg total) into feeding tube every 8 (eight) hours as needed for  severe pain. Qty: 100 mL, Refills: 0   Associated Diagnoses: Squamous cell esophageal cancer ([place]); Malignant neoplasm of middle third of esophagus (HCC)    diphenoxylate-atropine (LOMOTIL) 2.5-0.025 MG tablet Take 1 tablet by mouth 4 (four) times daily as needed for diarrhea or loose stools. Also take 1 pills 30-60 minutes prior to starting chemotherapy/week cycle. Qty: 40 tablet, Refills: 3   Associated Diagnoses: Squamous cell esophageal cancer (HCC)    ondansetron (ZOFRAN) 8 MG tablet Take 1 tablet (8 mg total) by mouth every 8 (eight) hours as needed for nausea or vomiting (start 3 days; after chemo). Qty: 40 tablet, Refills: 0   Associated Diagnoses: Squamous cell esophageal cancer (HCC)    prochlorperazine (COMPAZINE) 10 MG tablet Take 1 tablet (10 mg total) by mouth every 6 (six) hours as needed for nausea or vomiting. Qty: 40 tablet, Refills: 0   Associated Diagnoses: Squamous cell esophageal cancer ([place])         DISCHARGE INSTRUCTIONS:    Follow with cancer center.  If you experience worsening of your admission symptoms, develop shortness of breath, life threatening emergency, suicidal or homicidal thoughts you must seek medical attention immediately by calling 911 or calling your MD immediately  if symptoms less severe.  You Must read complete instructions/literature along with all the possible adverse reactions/side effects for all the Medicines you take and that have been prescribed to you. Take any new Medicines after you have completely understood and accept all the possible adverse reactions/side effects.   Please note  You were cared for by a hospitalist during your hospital stay. If you have any questions about your discharge medications or the care you received while you were in the hospital after you are discharged, you can call the unit and asked to speak with the hospitalist on call if the hospitalist that took care of you is not available. Once you are  discharged, your primary care physician will handle any further medical issues. Please note that NO REFILLS for any discharge medications will be authorized once you are discharged, as it is imperative that you return to your primary care physician (or establish a relationship with a primary care physician if you do not have one) for your aftercare needs so that they can reassess your need for medications and monitor your lab values.    Today   CHIEF COMPLAINT:   Chief Complaint  Patient presents with  . Emesis    HISTORY OF PRESENT ILLNESS:  Jeffrey [name]  is a 65 y.o. 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 Patient was diagnosed with esophageal cancer in October 2016 and had received radiation for esophagus and also just finished his radiation for the left neck lymph node last week. His been on carboplatin and Taxol for his cancer, Taxol was held due to infusion reaction. His last dose of chemotherapy was last week. He is being on Zofran and  Compazine as needed for his vomiting as outpatient. But states he hasn't needed any of his medications until yesterday. He has a PEG tube since his radiation and cancer was diagnosed as he has significant dysphagia. He uses his PEG tube for tube feeds, bolus feeds. But he still is able to drink water by mouth. He says since yesterday what ever he is drinking has been coming out. He says his tube feeds he has been tolerating fine without any vomiting. He feels dehydrated and presented to the emergency room. He has CK D stage III at baseline however creatinine seems to be worsened now. Due to his history of bladder cancer in, he has a reconstructed bladder and sometimes self catheterizes  himself. Denies any fevers or chills. The urine shows infection.   VITAL SIGNS:  Blood pressure 150/79, pulse 61, temperature 98.6 F (37 C), temperature source Oral, resp. rate 18, height 5\' 7"  (1.702 m), weight 59.603 kg (131 lb 6.4 oz), SpO2 100 %.  I/O:   Intake/Output Summary (Last 24 hours) at 05/23/15 0937 Last data filed at 05/23/15 0439  Gross per 24 hour  Intake   1400 ml  Output   2450 ml  Net  -1050 ml    PHYSICAL EXAMINATION:   GENERAL: 65 y.o.-year-old patient lying in the bed with no acute distress.  EYES: Pupils equal, round, reactive to light and accommodation. No scleral icterus. Extraocular muscles intact.  HEENT: Head atraumatic, normocephalic. Oropharynx and nasopharynx clear.  NECK: Supple, no jugular venous distention. No thyroid enlargement, no tenderness.  LUNGS: Normal breath sounds bilaterally, no wheezing, rales,rhonchi or crepitation. No use of accessory muscles of respiration.  CARDIOVASCULAR: S1, S2 normal. No murmurs, rubs, or gallops.  ABDOMEN: Soft, nontender, nondistended. Bowel sounds present. No rebound tenderness EXTREMITIES: No pedal edema, cyanosis, or clubbing.  NEUROLOGIC: Cranial nerves II through XII are intact. Muscle strength 5/5 in all extremities. Sensation intact. Gait not checked.  PSYCHIATRIC: The patient is alert and oriented x 3.  SKIN: No obvious rash, lesion, or ulcer.    DATA REVIEW:   CBC  Recent Labs Lab 05/23/15 0545  WBC 8.3  HGB 11.4*  HCT 34.3*  PLT 99*    Chemistries   Recent Labs Lab 05/20/15 1237  05/23/15 0545  NA 147*  < > 151*  K 4.6  < > 3.8  CL 112*  < > 120*  CO2 23  < > 26  GLUCOSE 113*  < > 106*  BUN 67*  < > 32*  CREATININE 2.28*  < > 1.85*  CALCIUM 9.4  < > 8.9  AST 23  --   --   ALT 11*  --   --   ALKPHOS 65  --   --   BILITOT 0.9  --   --   < > = values in this interval not displayed.  Cardiac Enzymes  Recent Labs Lab 05/20/15 1237  TROPONINI 0.03     Microbiology Results  Results for orders placed or performed during the hospital encounter of 05/20/15  Urine culture     Status: None   Collection Time: 05/20/15 12:37 PM  Result Value Ref Range Status   Specimen Description URINE, CATHETERIZED  Final   Special Requests Normal  Final   Culture >=100,000 COLONIES/mL KLEBSIELLA PNEUMONIAE  Final   Report Status 05/22/2015 FINAL  Final   Organism ID, Bacteria KLEBSIELLA PNEUMONIAE  Final      Susceptibility   Klebsiella pneumoniae - MIC*  AMPICILLIN 16 RESISTANT Resistant     CEFAZOLIN <=4 SENSITIVE Sensitive     CEFTRIAXONE <=1 SENSITIVE Sensitive     CIPROFLOXACIN <=0.25 SENSITIVE Sensitive     GENTAMICIN <=1 SENSITIVE Sensitive     IMIPENEM <=0.25 SENSITIVE Sensitive     NITROFURANTOIN 32 SENSITIVE Sensitive     TRIMETH/SULFA <=20 SENSITIVE Sensitive     AMPICILLIN/SULBACTAM 4 SENSITIVE Sensitive     PIP/TAZO 8 SENSITIVE Sensitive     Extended ESBL NEGATIVE Sensitive     * >=100,000 COLONIES/mL KLEBSIELLA PNEUMONIAE    RADIOLOGY:  No results found.  EKG:   Orders placed or performed during the hospital encounter of 05/20/15  . ED EKG  . ED EKG      Management plans discussed with the patient, family and they are in agreement.  CODE STATUS:     Code Status Orders        Start     Ordered   05/20/15 1707  Full code   Continuous     05/20/15 1706    Code Status History    Date Active Date Inactive Code Status Order ID Comments User Context   01/20/2015  2:35 PM 01/24/2015  5:16 PM Full Code [id number]  Fritzi Mandes, MD Inpatient      TOTAL TIME TAKING CARE OF THIS PATIENT: 35 minutes.    Vaughan Basta M.D on 05/23/2015 at 9:37 AM  Between 7am to 6pm - Pager - [phone]  After 6pm go to [email or web address] - password [email or web address] [facility]  Office  [phone]  CC: Primary care physician; Adin Hector, MD   Note: This dictation was prepared with Dragon dictation  along with smaller phrase technology. Any transcriptional errors that result from this process are unintentional.

## 2015-05-23 NOTE — Discharge Instructions (Signed)

## 2015-05-23 NOTE — Progress Notes (Signed)
Jeffrey [name]   DOB:06-Jul-1950   [id number]    Subjective: Continued difficulty swallowing.   Patient had EGD yesterday that showed malignant stricture- 3 Mm lumen; no dilatation possible. No biopsies done.  No acute events overnight He is able to keep down few sips of water/ice chips; he is unable to swallow any food.  Ros: no chest pain or shortness of breath or cough. No fevers.  Objective:  Filed Vitals:   05/22/15 2137 05/23/15 0541  BP: 155/84 150/79  Pulse: 74 61  Temp: 98.5 F (36.9 C) 98.6 F (37 C)  Resp: 18 18     Intake/Output Summary (Last 24 hours) at 05/23/15 1002 Last data filed at 05/23/15 0900  Gross per 24 hour  Intake   1400 ml  Output   3250 ml  Net  -1850 ml    GENERAL: Thin built moderately nourished African-American male patient Alert, no distress; he is accompanied by his daughter.  EYES: no pallor or icterus OROPHARYNX: no thrush or ulceration; poor dentition  NECK: Left neck approximately 3-4 cm masses felt [ no significant improvement] LYMPH: no palpable lymphadenopathy axillary or inguinal regions LUNGS: clear to auscultation and No wheeze or crackles HEART/CVS: regular rate & rhythm and no murmurs; No lower extremity edema ABDOMEN: abdomen soft, non-tender and normal bowel sounds; positive for PEG tube. Musculoskeletal:no cyanosis of digits and no clubbing  PSYCH: alert & oriented x 3 with fluent speech NEURO: no focal motor/sensory deficits SKIN: no rashes or significant lesions  Labs:  Lab Results  Component Value Date   WBC 8.3 05/23/2015   HGB 11.4* 05/23/2015   HCT 34.3* 05/23/2015   MCV 96.6 05/23/2015   PLT 99* 05/23/2015   NEUTROABS 8.9* 05/20/2015    Lab Results  Component Value Date   NA 151* 05/23/2015   K 3.8 05/23/2015   CL 120* 05/23/2015   CO2 26 05/23/2015    Studies:  No results found.  Assessment & Plan:   # metastatic esophageal cancer progressive dysphagia- based on the EGD findings- concerning  for progression of disease in the esophagus; closing the lumen causing regurgitation.   # Recommend systemic chemotherapy- starting February 20/Monday.   # patient was reminded to pick up a prescription for Lomotil/faxed to his pharmacy- and take it prior to his chemotherapy on Monday.  # Spoke to patient's daughter/patient in detail. Also spoke to Dr. Marthann Schiller. Patient will be discharged home today.    Cammie Sickle, MD 05/23/2015  10:02 AM

## 2015-05-23 NOTE — Care Management Note (Signed)
Case Management Note  Patient Details  Name: Jeffrey [name] MRN: [id number] Date of Birth: 1950-04-10  Subjective/Objective:       Spoke to Dr Judd Gaudier and Tieton RN about discharge planning. [name] has been managing his own PEG tube feedings and gets Jevity delivered by [facility]. [name] is self sufficient at home. No new home health orders. Fax to [facility] to resume [facility] to home.              Action/Plan:   Expected Discharge Date:                  Expected Discharge Plan:     In-House Referral:     Discharge planning Services     Post Acute Care Choice:    Choice offered to:     DME Arranged:    DME Agency:     HH Arranged:    [place] Agency:     Status of Service:     Medicare Important Message Given:    Date Medicare IM Given:    Medicare IM give by:    Date Additional Medicare IM Given:    Additional Medicare Important Message give by:     If discussed at [place] of Stay Meetings, dates discussed:    Additional Comments:  Regena Delucchi A, RN 05/23/2015, 10:17 AM

## 2015-05-23 NOTE — Plan of Care (Signed)
Problem: Bowel/Gastric: Goal: Will not experience complications related to bowel motility Outcome: Not Progressing Last BM 05/20/15

## 2015-05-25 ENCOUNTER — Other Ambulatory Visit: Payer: Self-pay | Admitting: Internal Medicine

## 2015-05-25 ENCOUNTER — Inpatient Hospital Stay: Payer: Self-pay

## 2015-05-25 ENCOUNTER — Inpatient Hospital Stay (HOSPITAL_BASED_OUTPATIENT_CLINIC_OR_DEPARTMENT_OTHER): Payer: Self-pay | Admitting: Internal Medicine

## 2015-05-25 ENCOUNTER — Encounter: Payer: Self-pay | Admitting: Unknown Physician Specialty

## 2015-05-25 ENCOUNTER — Telehealth: Payer: Self-pay | Admitting: Pharmacist

## 2015-05-25 VITALS — BP 134/80 | HR 82 | Temp 98.1°F | Resp 18 | Ht 67.0 in | Wt 132.5 lb

## 2015-05-25 DIAGNOSIS — D649 Anemia, unspecified: Secondary | ICD-10-CM

## 2015-05-25 DIAGNOSIS — J439 Emphysema, unspecified: Secondary | ICD-10-CM

## 2015-05-25 DIAGNOSIS — F17210 Nicotine dependence, cigarettes, uncomplicated: Secondary | ICD-10-CM

## 2015-05-25 DIAGNOSIS — Z931 Gastrostomy status: Secondary | ICD-10-CM

## 2015-05-25 DIAGNOSIS — C154 Malignant neoplasm of middle third of esophagus: Secondary | ICD-10-CM

## 2015-05-25 DIAGNOSIS — G893 Neoplasm related pain (acute) (chronic): Secondary | ICD-10-CM

## 2015-05-25 DIAGNOSIS — N183 Chronic kidney disease, stage 3 (moderate): Secondary | ICD-10-CM

## 2015-05-25 DIAGNOSIS — R530 Neoplastic (malignant) related fatigue: Secondary | ICD-10-CM

## 2015-05-25 DIAGNOSIS — E46 Unspecified protein-calorie malnutrition: Secondary | ICD-10-CM

## 2015-05-25 DIAGNOSIS — I129 Hypertensive chronic kidney disease with stage 1 through stage 4 chronic kidney disease, or unspecified chronic kidney disease: Secondary | ICD-10-CM

## 2015-05-25 DIAGNOSIS — Z8551 Personal history of malignant neoplasm of bladder: Secondary | ICD-10-CM

## 2015-05-25 DIAGNOSIS — R1310 Dysphagia, unspecified: Secondary | ICD-10-CM

## 2015-05-25 DIAGNOSIS — C770 Secondary and unspecified malignant neoplasm of lymph nodes of head, face and neck: Secondary | ICD-10-CM

## 2015-05-25 DIAGNOSIS — Z85118 Personal history of other malignant neoplasm of bronchus and lung: Secondary | ICD-10-CM

## 2015-05-25 DIAGNOSIS — C159 Malignant neoplasm of esophagus, unspecified: Secondary | ICD-10-CM

## 2015-05-25 LAB — CBC WITH DIFFERENTIAL/PLATELET
Basophils Absolute: 0 10*3/uL (ref 0–0.1)
Basophils Relative: 0 %
EOS ABS: 0.1 10*3/uL (ref 0–0.7)
EOS PCT: 2 %
HCT: 35.2 % — ABNORMAL LOW (ref 40.0–52.0)
Hemoglobin: 11.8 g/dL — ABNORMAL LOW (ref 13.0–18.0)
LYMPHS ABS: 0.7 10*3/uL — AB (ref 1.0–3.6)
LYMPHS PCT: 8 %
MCH: 32 pg (ref 26.0–34.0)
MCHC: 33.6 g/dL (ref 32.0–36.0)
MCV: 95.4 fL (ref 80.0–100.0)
MONO ABS: 0.7 10*3/uL (ref 0.2–1.0)
Monocytes Relative: 9 %
Neutro Abs: 6.6 10*3/uL — ABNORMAL HIGH (ref 1.4–6.5)
Neutrophils Relative %: 81 %
PLATELETS: 86 10*3/uL — AB (ref 150–440)
RBC: 3.69 MIL/uL — ABNORMAL LOW (ref 4.40–5.90)
RDW: 21.7 % — AB (ref 11.5–14.5)
WBC: 8.1 10*3/uL (ref 3.8–10.6)

## 2015-05-25 LAB — COMPREHENSIVE METABOLIC PANEL
ALT: 18 U/L (ref 17–63)
ANION GAP: 5 (ref 5–15)
AST: 17 U/L (ref 15–41)
Albumin: 4.1 g/dL (ref 3.5–5.0)
Alkaline Phosphatase: 66 U/L (ref 38–126)
BUN: 45 mg/dL — ABNORMAL HIGH (ref 6–20)
CHLORIDE: 114 mmol/L — AB (ref 101–111)
CO2: 24 mmol/L (ref 22–32)
CREATININE: 2.07 mg/dL — AB (ref 0.61–1.24)
Calcium: 9 mg/dL (ref 8.9–10.3)
GFR, EST AFRICAN AMERICAN: 38 mL/min — AB (ref >60)
GFR, EST NON AFRICAN AMERICAN: 33 mL/min — AB (ref >60)
Glucose, Bld: 95 mg/dL (ref 65–99)
POTASSIUM: 3.8 mmol/L (ref 3.5–5.1)
SODIUM: 143 mmol/L (ref 135–145)
Total Bilirubin: 0.7 mg/dL (ref 0.3–1.2)
Total Protein: 7.9 g/dL (ref 6.5–8.1)

## 2015-05-25 MED ORDER — FLUOROURACIL CHEMO INJECTION 2.5 GM/50ML
400.0000 mg/m2 | Freq: Once | INTRAVENOUS | Status: AC
  Administered 2015-05-25: 700 mg via INTRAVENOUS
  Filled 2015-05-25: qty 14

## 2015-05-25 MED ORDER — LEUCOVORIN CALCIUM INJECTION 350 MG
700.0000 mg | Freq: Once | INTRAVENOUS | Status: AC
  Administered 2015-05-25: 700 mg via INTRAVENOUS
  Filled 2015-05-25: qty 25

## 2015-05-25 MED ORDER — ATROPINE SULFATE 1 MG/ML IJ SOLN
0.5000 mg | Freq: Once | INTRAMUSCULAR | Status: AC | PRN
  Administered 2015-05-25: 0.5 mg via INTRAVENOUS
  Filled 2015-05-25: qty 1

## 2015-05-25 MED ORDER — HEPARIN SOD (PORK) LOCK FLUSH 100 UNIT/ML IV SOLN: 500.0000 [IU] | Freq: Once | INTRAVENOUS | Status: DC | PRN

## 2015-05-25 MED ORDER — SODIUM CHLORIDE 0.9 % IV SOLN
2400.0000 mg/m2 | INTRAVENOUS | Status: DC
  Administered 2015-05-25: 4150 mg via INTRAVENOUS
  Filled 2015-05-25: qty 83

## 2015-05-25 MED ORDER — IRINOTECAN HCL CHEMO INJECTION 100 MG/5ML: 180.0000 mg/m2 | Freq: Once | INTRAVENOUS | Status: DC

## 2015-05-25 MED ORDER — SODIUM CHLORIDE 0.9 % IV SOLN
10.0000 mg | Freq: Once | INTRAVENOUS | Status: AC
  Administered 2015-05-25: 10 mg via INTRAVENOUS
  Filled 2015-05-25: qty 1

## 2015-05-25 MED ORDER — PALONOSETRON HCL INJECTION 0.25 MG/5ML
0.2500 mg | Freq: Once | INTRAVENOUS | Status: AC
  Administered 2015-05-25: 0.25 mg via INTRAVENOUS
  Filled 2015-05-25: qty 5

## 2015-05-25 MED ORDER — OXYCODONE HCL 5 MG/5ML PO SOLN: 5.0000 mg | Freq: Three times a day (TID) | ORAL | Status: DC | PRN

## 2015-05-25 MED ORDER — IRINOTECAN HCL CHEMO INJECTION 100 MG/5ML
300.0000 mg | Freq: Once | INTRAVENOUS | Status: AC
  Administered 2015-05-25: 300 mg via INTRAVENOUS
  Filled 2015-05-25: qty 5

## 2015-05-25 MED ORDER — SODIUM CHLORIDE 0.9 % IV SOLN
Freq: Once | INTRAVENOUS | Status: AC
  Administered 2015-05-25: 10:00:00 via INTRAVENOUS
  Filled 2015-05-25: qty 1000

## 2015-05-25 NOTE — Progress Notes (Signed)
Per Dr. Burlene Arnt continue with treatment.  He is aware of low plt count and high creatine level.

## 2015-05-25 NOTE — Progress Notes (Signed)
.[place] NOTE  Patient Care Team: Adin Hector, MD as PCP - General (Internal Medicine) Clent Jacks, RN as Registered Nurse  CHIEF COMPLAINTS/PURPOSE OF CONSULTATION:   # OCT 2016- SQUAMOUS CELL CA of middle esophagus; Stage IV [EGD bx]; PET- Esopahgeal uptake & 2 CM Left Cervical LN-MET SQUAM; Carbo-taxol [Nov 9th]with RT [Nov 14th];  Cont Weekly Carbo- RT; JAN 3rd- 2017-PET stable esophageal uptake/slight increase neck adenopathy- s/p local radiation to the neck with carboplatin-   # FEB 20th- START FOLFIRI   # TAXOL HELD sec to ACUTE INFUSION REACTIONS  # malnutrition s/p PEG tube  # CKD [stage III]; Hx of non-invasive bladder ca [s/p cystectomy with neo-bladder; [facility]]  HISTORY OF PRESENTING ILLNESS:  Jeffrey [name] 65 y.o. 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  Patient is an admission discharge about 2 days ago for difficult swallowing regurgitation; on a repeat endoscopy noted to have progressive disease in the esophagus.   Patient has mostly been using his PEG tube for his nutrition; however he still able to swallow water.  He is taking oxycodone liquid-for his neck pain; also for the pain in the PEG tube site.  Weight around 135 pounds today. Denies any fever or chills nausea vomiting.   ROS: A complete 10 point review of system is done which is negative except mentioned above in history of present illness  MEDICAL HISTORY:  Past Medical History  Diagnosis Date  . Hypertension   . Bladder cancer [facility])     s/p surgery and reconstruction  . Emphysema of lung ([place])   . Esophageal cancer ([place])   . Chronic kidney disease     stage 3    SURGICAL HISTORY: Past Surgical History  Procedure Laterality Date  .  Bladder removal    . Esophagogastroduodenoscopy (egd) with propofol N/A 01/21/2015    Procedure: ESOPHAGOGASTRODUODENOSCOPY (EGD) WITH PROPOFOL-looking in the esophagus stomach and upper small intestine to evaluate and treat;  Surgeon: [name], MD;  Location: [facility] ENDOSCOPY;  Service: Endoscopy;  Laterality: N/A;  . Gastrostomy w/ feeding tube Left     placed end of october 2016  . Portacath placement Right 02/04/2015    Procedure: INSERTION PORT-A-CATH;  Surgeon: [name], MD;  Location: [facility] ORS;  Service: General;  Laterality: Right;  . Lymph node biopsy N/A 02/04/2015    Procedure: LYMPH NODE BIOPSY;  Surgeon: [name], MD;  Location: [facility] ORS;  Service: General;  Laterality: N/A;    SOCIAL HISTORY: Social History   Social History  . Marital Status: Married    Spouse Name: N/A  . Number of Children: N/A  . Years of Education: N/A   Occupational History  . Not on file.   Social History Main Topics  . Smoking status: Current Every Day Smoker -- 0.50 packs/day    Types: Cigarettes  . Smokeless tobacco: Current User    Types: Chew  . Alcohol Use: No     Comment: quit drinking 3 months ago  . Drug Use: No  . Sexual Activity: Not on file   Other Topics Concern  . Not on file   Social History Narrative   Lives at home with wife. Independent at baseline.    FAMILY HISTORY: Family History  Problem Relation Age of Onset  .  CAD Father   . Breast cancer Mother     ALLERGIES:  is allergic to aspirin and taxol.  MEDICATIONS:  Current Outpatient Prescriptions  Medication Sig Dispense Refill  . amLODipine (NORVASC) 10 MG tablet Place 1 tablet (10 mg total) into feeding tube daily. 30 tablet 0  . cephALEXin (KEFLEX) 250 MG/5ML suspension Place 10 mLs (500 mg total) into feeding tube every 12 (twelve) hours. 100 mL 0  . diphenoxylate-atropine (LOMOTIL) 2.5-0.025 MG tablet Take 1 tablet by mouth 4 (four) times daily as needed for diarrhea or loose stools. Also  take 1 pills 30-60 minutes prior to starting chemotherapy/week cycle. 40 tablet 3  . lidocaine-prilocaine (EMLA) cream Apply cream 1 hour before chemotherapy treatment, place a small amount of saran wrap over the cream to protect your clothing 30 g 1  . neomycin-bacitracin-polymyxin (NEOSPORIN) OINT Apply 1 application topically daily. 30 g 1  . Nutritional Supplements (FEEDING SUPPLEMENT, JEVITY 1.5 CAL/FIBER,) LIQD Place 237 mLs into feeding tube 6 (six) times daily. 1000 mL 5  . ondansetron (ZOFRAN) 8 MG tablet Take 1 tablet (8 mg total) by mouth every 8 (eight) hours as needed for nausea or vomiting (start 3 days; after chemo). 40 tablet 0  . oxyCODONE (ROXICODONE) 5 MG/5ML solution Place 5 mLs (5 mg total) into feeding tube every 8 (eight) hours as needed for severe pain. 100 mL 0  . prochlorperazine (COMPAZINE) 10 MG tablet Take 1 tablet (10 mg total) by mouth every 6 (six) hours as needed for nausea or vomiting. 40 tablet 0   No current facility-administered medications for this visit.   Facility-Administered Medications Ordered in Other Visits  Medication Dose Route Frequency Provider Last Rate Last Dose  . sodium chloride 0.9 % injection 10 mL  10 mL Intracatheter PRN Cammie Sickle, MD   10 mL at 03/04/15 0933      .  PHYSICAL EXAMINATION: ECOG PERFORMANCE STATUS: 1 - Symptomatic but completely ambulatory  Filed Vitals:   05/25/15 0923  BP: 134/80  Pulse: 82  Temp: 98.1 F (36.7 C)  Resp: 18   Filed Weights   05/25/15 0923  Weight: 132 lb 7.9 oz (60.1 kg)    GENERAL: Thin built; moderately nourished. Alert, no distress and comfortable.   Accompanied by his wife EYES: no pallor or icterus OROPHARYNX: no thrush or ulceration; good dentition  NECK: supple, no masses felt LYMPH:  3-4cm CM palpable lymphadenopathy in the cervical on left side [slight increase] LUNGS: clear to auscultation and  No wheeze or crackles HEART/CVS: regular rate & rhythm and no murmurs; No  lower extremity edema ABDOMEN: abdomen soft, non-tender and normal bowel sounds; PEG tube in place Musculoskeletal:no cyanosis of digits and no clubbing  PSYCH: alert & oriented x 3 with fluent speech NEURO: no focal motor/sensory deficits SKIN:  no rashes or significant lesions  LABORATORY DATA:  I have reviewed the data as listed Lab Results  Component Value Date   WBC 8.1 05/25/2015   HGB 11.8* 05/25/2015   HCT 35.2* 05/25/2015   MCV 95.4 05/25/2015   PLT 86* 05/25/2015    Recent Labs  05/13/15 0850 05/20/15 1237 05/21/15 0602 05/23/15 0545 05/25/15 0903  NA 137 147* 148* 151* 143  K 4.5 4.6 4.5 3.8 3.8  CL 106 112* 118* 120* 114*  CO2 '22 23 24 26 24  ' GLUCOSE 130* 113* 164* 106* 95  BUN 54* 67* 50* 32* 45*  CREATININE 1.99* 2.28* 2.03* 1.85* 2.07*  CALCIUM 8.7* 9.4  8.8* 8.9 9.0  GFRNONAA 34* 29* 33* 37* 33*  GFRAA 40* 34* 39* 43* 38*  PROT 7.7 9.2*  --   --  7.9  ALBUMIN 4.0 4.4  --   --  4.1  AST 16 23  --   --  17  ALT 9* 11*  --   --  18  ALKPHOS 61 65  --   --  66  BILITOT 0.7 0.9  --   --  0.7    RADIOGRAPHIC STUDIES: I have personally reviewed the radiological images as listed and agreed with the findings in the report. No results found.  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 Patient has progressive disease in the esophagus/left neck. Patient and family understands the treatments are palliative/not curative.  # We plan to start 5-FU continuous infusion/ along with Irinotecan every 2 weeks- starting  Today. I reviewed the potential side effects of the chemotherapy including but not limited to diarrhea oral sores. Patient has taken Lomotil prior to starting Treatment. Also I have discussed at length regarding using Imodium for diarrhea. Written instructions were given.  Platelets slightly low at 88 hemoglobin stable around 11.  # Chronic kidney disease- base and creatinine around 1.6; today creatinine  is ~2.0;    # Regards to anemia- appears to be more from anemia of chronic kidney disease-hemoglobin is 10.   # Pain secondary to malignancy/ PEG tube- continue oxycodone. New pain Prescription given.  # Patient will have CBC BMP in 1 week/follow-up with me in In 2 weeks with infusion again.   # 25 minutes face-to-face with the patient discussing the above plan of care; more than 50% of time spent on prognosis/ natural history; counseling and coordination.      Cammie Sickle, MD 05/25/2015 9:39 AM

## 2015-05-25 NOTE — Telephone Encounter (Signed)
Platelets= 86. Call to MD. He wants to precede with treatment as ordered.

## 2015-05-27 ENCOUNTER — Inpatient Hospital Stay: Payer: Self-pay

## 2015-05-27 VITALS — BP 119/73 | HR 66 | Temp 98.1°F | Resp 18

## 2015-05-27 DIAGNOSIS — C154 Malignant neoplasm of middle third of esophagus: Secondary | ICD-10-CM

## 2015-05-27 MED ORDER — SODIUM CHLORIDE 0.9% FLUSH
10.0000 mL | INTRAVENOUS | Status: DC | PRN
  Administered 2015-05-27: 10 mL
  Filled 2015-05-27: qty 10

## 2015-05-27 MED ORDER — HEPARIN SOD (PORK) LOCK FLUSH 100 UNIT/ML IV SOLN
INTRAVENOUS | Status: AC
  Filled 2015-05-27: qty 5

## 2015-05-27 MED ORDER — HEPARIN SOD (PORK) LOCK FLUSH 100 UNIT/ML IV SOLN
500.0000 [IU] | Freq: Once | INTRAVENOUS | Status: AC | PRN
  Administered 2015-05-27: 500 [IU]

## 2015-06-01 ENCOUNTER — Inpatient Hospital Stay: Payer: Self-pay

## 2015-06-01 DIAGNOSIS — C154 Malignant neoplasm of middle third of esophagus: Secondary | ICD-10-CM

## 2015-06-01 DIAGNOSIS — C159 Malignant neoplasm of esophagus, unspecified: Secondary | ICD-10-CM

## 2015-06-01 LAB — BASIC METABOLIC PANEL
ANION GAP: 5 (ref 5–15)
BUN: 36 mg/dL — ABNORMAL HIGH (ref 6–20)
CALCIUM: 8.5 mg/dL — AB (ref 8.9–10.3)
CO2: 26 mmol/L (ref 22–32)
Chloride: 99 mmol/L — ABNORMAL LOW (ref 101–111)
Creatinine, Ser: 2.06 mg/dL — ABNORMAL HIGH (ref 0.61–1.24)
GFR, EST AFRICAN AMERICAN: 38 mL/min — AB (ref >60)
GFR, EST NON AFRICAN AMERICAN: 33 mL/min — AB (ref >60)
Glucose, Bld: 113 mg/dL — ABNORMAL HIGH (ref 65–99)
Potassium: 4 mmol/L (ref 3.5–5.1)
Sodium: 130 mmol/L — ABNORMAL LOW (ref 135–145)

## 2015-06-01 LAB — CBC WITH DIFFERENTIAL/PLATELET
BASOS ABS: 0 10*3/uL (ref 0–0.1)
Eosinophils Absolute: 0.1 10*3/uL (ref 0–0.7)
Eosinophils Relative: 2 %
HEMATOCRIT: 32 % — AB (ref 40.0–52.0)
Hemoglobin: 10.8 g/dL — ABNORMAL LOW (ref 13.0–18.0)
Lymphocytes Relative: 16 %
Lymphs Abs: 0.7 10*3/uL — ABNORMAL LOW (ref 1.0–3.6)
MCH: 31.9 pg (ref 26.0–34.0)
MCHC: 33.8 g/dL (ref 32.0–36.0)
MCV: 94.4 fL (ref 80.0–100.0)
MONO ABS: 0.2 10*3/uL (ref 0.2–1.0)
Monocytes Relative: 5 %
NEUTROS ABS: 3.2 10*3/uL (ref 1.4–6.5)
Platelets: 35 10*3/uL — ABNORMAL LOW (ref 150–440)
RBC: 3.39 MIL/uL — ABNORMAL LOW (ref 4.40–5.90)
RDW: 19.7 % — AB (ref 11.5–14.5)
WBC: 4.2 10*3/uL (ref 3.8–10.6)

## 2015-06-08 ENCOUNTER — Inpatient Hospital Stay: Payer: Self-pay

## 2015-06-08 ENCOUNTER — Inpatient Hospital Stay: Payer: Self-pay | Attending: Internal Medicine | Admitting: Internal Medicine

## 2015-06-08 ENCOUNTER — Other Ambulatory Visit: Payer: Self-pay | Admitting: Internal Medicine

## 2015-06-08 VITALS — BP 108/70 | HR 78 | Temp 98.7°F | Ht 68.0 in | Wt 135.6 lb

## 2015-06-08 DIAGNOSIS — Z9221 Personal history of antineoplastic chemotherapy: Secondary | ICD-10-CM | POA: Insufficient documentation

## 2015-06-08 DIAGNOSIS — C154 Malignant neoplasm of middle third of esophagus: Secondary | ICD-10-CM

## 2015-06-08 DIAGNOSIS — I129 Hypertensive chronic kidney disease with stage 1 through stage 4 chronic kidney disease, or unspecified chronic kidney disease: Secondary | ICD-10-CM | POA: Insufficient documentation

## 2015-06-08 DIAGNOSIS — F17210 Nicotine dependence, cigarettes, uncomplicated: Secondary | ICD-10-CM | POA: Insufficient documentation

## 2015-06-08 DIAGNOSIS — N183 Chronic kidney disease, stage 3 (moderate): Secondary | ICD-10-CM | POA: Insufficient documentation

## 2015-06-08 DIAGNOSIS — E46 Unspecified protein-calorie malnutrition: Secondary | ICD-10-CM | POA: Insufficient documentation

## 2015-06-08 DIAGNOSIS — R1310 Dysphagia, unspecified: Secondary | ICD-10-CM | POA: Insufficient documentation

## 2015-06-08 DIAGNOSIS — C159 Malignant neoplasm of esophagus, unspecified: Secondary | ICD-10-CM

## 2015-06-08 DIAGNOSIS — Z8551 Personal history of malignant neoplasm of bladder: Secondary | ICD-10-CM | POA: Insufficient documentation

## 2015-06-08 DIAGNOSIS — K9423 Gastrostomy malfunction: Secondary | ICD-10-CM | POA: Insufficient documentation

## 2015-06-08 DIAGNOSIS — J439 Emphysema, unspecified: Secondary | ICD-10-CM | POA: Insufficient documentation

## 2015-06-08 DIAGNOSIS — Z923 Personal history of irradiation: Secondary | ICD-10-CM | POA: Insufficient documentation

## 2015-06-08 DIAGNOSIS — R197 Diarrhea, unspecified: Secondary | ICD-10-CM | POA: Insufficient documentation

## 2015-06-08 DIAGNOSIS — Z79899 Other long term (current) drug therapy: Secondary | ICD-10-CM | POA: Insufficient documentation

## 2015-06-08 DIAGNOSIS — Z931 Gastrostomy status: Secondary | ICD-10-CM | POA: Insufficient documentation

## 2015-06-08 DIAGNOSIS — C7989 Secondary malignant neoplasm of other specified sites: Secondary | ICD-10-CM | POA: Insufficient documentation

## 2015-06-08 DIAGNOSIS — E871 Hypo-osmolality and hyponatremia: Secondary | ICD-10-CM | POA: Insufficient documentation

## 2015-06-08 DIAGNOSIS — Z803 Family history of malignant neoplasm of breast: Secondary | ICD-10-CM | POA: Insufficient documentation

## 2015-06-08 LAB — CBC WITH DIFFERENTIAL/PLATELET
BASOS PCT: 0 %
Basophils Absolute: 0 10*3/uL (ref 0–0.1)
EOS ABS: 0.1 10*3/uL (ref 0–0.7)
Eosinophils Relative: 4 %
HCT: 28 % — ABNORMAL LOW (ref 40.0–52.0)
HEMOGLOBIN: 9.6 g/dL — AB (ref 13.0–18.0)
Lymphocytes Relative: 20 %
Lymphs Abs: 0.4 10*3/uL — ABNORMAL LOW (ref 1.0–3.6)
MCH: 32.7 pg (ref 26.0–34.0)
MCHC: 34.2 g/dL (ref 32.0–36.0)
MCV: 95.5 fL (ref 80.0–100.0)
Monocytes Absolute: 0.2 10*3/uL (ref 0.2–1.0)
Monocytes Relative: 10 %
NEUTROS PCT: 66 %
Neutro Abs: 1.2 10*3/uL — ABNORMAL LOW (ref 1.4–6.5)
Platelets: 39 10*3/uL — ABNORMAL LOW (ref 150–440)
RBC: 2.93 MIL/uL — AB (ref 4.40–5.90)
RDW: 19 % — ABNORMAL HIGH (ref 11.5–14.5)
WBC: 1.8 10*3/uL — AB (ref 3.8–10.6)

## 2015-06-08 LAB — COMPREHENSIVE METABOLIC PANEL
ALBUMIN: 3.8 g/dL (ref 3.5–5.0)
ALT: 9 U/L — AB (ref 17–63)
AST: 15 U/L (ref 15–41)
Alkaline Phosphatase: 57 U/L (ref 38–126)
Anion gap: 4 — ABNORMAL LOW (ref 5–15)
BILIRUBIN TOTAL: 0.6 mg/dL (ref 0.3–1.2)
BUN: 27 mg/dL — AB (ref 6–20)
CALCIUM: 8.4 mg/dL — AB (ref 8.9–10.3)
CHLORIDE: 98 mmol/L — AB (ref 101–111)
CO2: 27 mmol/L (ref 22–32)
CREATININE: 1.86 mg/dL — AB (ref 0.61–1.24)
GFR, EST AFRICAN AMERICAN: 43 mL/min — AB (ref >60)
GFR, EST NON AFRICAN AMERICAN: 37 mL/min — AB (ref >60)
Glucose, Bld: 112 mg/dL — ABNORMAL HIGH (ref 65–99)
POTASSIUM: 3.8 mmol/L (ref 3.5–5.1)
SODIUM: 129 mmol/L — AB (ref 135–145)
TOTAL PROTEIN: 7.3 g/dL (ref 6.5–8.1)

## 2015-06-08 MED ORDER — SODIUM CHLORIDE 0.9% FLUSH
10.0000 mL | INTRAVENOUS | Status: DC | PRN
  Administered 2015-06-08: 10 mL via INTRAVENOUS
  Filled 2015-06-08: qty 10

## 2015-06-08 MED ORDER — HEPARIN SOD (PORK) LOCK FLUSH 100 UNIT/ML IV SOLN
500.0000 [IU] | Freq: Once | INTRAVENOUS | Status: AC
  Administered 2015-06-08: 500 [IU] via INTRAVENOUS
  Filled 2015-06-08: qty 5

## 2015-06-08 MED ORDER — OXYCODONE HCL 5 MG/5ML PO SOLN: 5.0000 mg | Freq: Three times a day (TID) | ORAL | Status: DC | PRN

## 2015-06-08 NOTE — Progress Notes (Signed)
.[place] NOTE  Patient Care Team: Adin Hector, MD as PCP - General (Internal Medicine) Clent Jacks, RN as Registered Nurse  CHIEF COMPLAINTS/PURPOSE OF CONSULTATION:   # OCT 2016- SQUAMOUS CELL CA of middle esophagus; Stage IV [EGD bx]; PET- Esopahgeal uptake & 2 CM Left Cervical LN-MET SQUAM; Carbo-taxol [Nov 9th]with RT [Nov 14th];  Cont Weekly Carbo- RT; JAN 3rd- 2017-PET stable esophageal uptake/slight increase neck adenopathy- s/p local radiation to the neck with carboplatin-   # FEB 20th- START FOLFIRI   # TAXOL HELD sec to ACUTE INFUSION REACTIONS  # malnutrition s/p PEG tube  # CKD [stage III]; Hx of non-invasive bladder ca [s/p cystectomy with neo-bladder; [facility]]  HISTORY OF PRESENTING ILLNESS:  Jeffrey [name] 65 y.o. 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 Patient currently on first line systemic chemotherapy with 4 field. Status post cycle #1.  Patient denies any diarrhea. Denies any infusion reaction with the chemotherapy. No nausea no vomiting. No diarrhea. Appetite is fair.; Still continues to have difficulty swallowing hence using the PEG tube. Patient's weight is steady around 135 pounds.  ROS: A complete 10 point review of system is done which is negative except mentioned above in history of present illness  MEDICAL HISTORY:  Past Medical History  Diagnosis Date  . Hypertension   . Bladder cancer [facility])     s/p surgery and reconstruction  . Emphysema of lung ([place])   . Esophageal cancer ([place])   . Chronic kidney disease     stage 3    SURGICAL HISTORY: Past Surgical History  Procedure Laterality Date  . Bladder removal    . Esophagogastroduodenoscopy (egd) with propofol N/A 01/21/2015    Procedure:  ESOPHAGOGASTRODUODENOSCOPY (EGD) WITH PROPOFOL-looking in the esophagus stomach and upper small intestine to evaluate and treat;  Surgeon: [name], MD;  Location: [facility] ENDOSCOPY;  Service: Endoscopy;  Laterality: N/A;  . Gastrostomy w/ feeding tube Left     placed end of october 2016  . Portacath placement Right 02/04/2015    Procedure: INSERTION PORT-A-CATH;  Surgeon: [name], MD;  Location: [facility] ORS;  Service: General;  Laterality: Right;  . Lymph node biopsy N/A 02/04/2015    Procedure: LYMPH NODE BIOPSY;  Surgeon: [name], MD;  Location: [facility] ORS;  Service: General;  Laterality: N/A;  . Esophagogastroduodenoscopy N/A 05/22/2015    Procedure: ESOPHAGOGASTRODUODENOSCOPY (EGD);  Surgeon: Manya Silvas, MD;  Location: [facility] ENDOSCOPY;  Service: Endoscopy;  Laterality: N/A;    SOCIAL HISTORY: Social History   Social History  . Marital Status: Married    Spouse Name: N/A  . Number of Children: N/A  . Years of Education: N/A   Occupational History  . Not on file.   Social History Main Topics  . Smoking status: Current Every Day Smoker -- 0.50 packs/day    Types: Cigarettes  . Smokeless tobacco: Current User    Types: Chew  . Alcohol Use: No     Comment: quit drinking 3 months ago  . Drug Use: No  . Sexual Activity: Not on file   Other Topics Concern  . Not on file   Social History Narrative   Lives at home with wife. Independent at baseline.    FAMILY HISTORY: Family History  Problem Relation Age  of Onset  . CAD Father   . Breast cancer Mother     ALLERGIES:  is allergic to aspirin and taxol.  MEDICATIONS:  Current Outpatient Prescriptions  Medication Sig Dispense Refill  . amLODipine (NORVASC) 10 MG tablet Place 1 tablet (10 mg total) into feeding tube daily. 30 tablet 0  . diphenoxylate-atropine (LOMOTIL) 2.5-0.025 MG tablet Take 1 tablet by mouth 4 (four) times daily as needed for diarrhea or loose stools. Also take 1 pills 30-60 minutes prior  to starting chemotherapy/week cycle. 40 tablet 3  . lidocaine-prilocaine (EMLA) cream Apply cream 1 hour before chemotherapy treatment, place a small amount of saran wrap over the cream to protect your clothing 30 g 1  . neomycin-bacitracin-polymyxin (NEOSPORIN) OINT Apply 1 application topically daily. 30 g 1  . Nutritional Supplements (FEEDING SUPPLEMENT, JEVITY 1.5 CAL/FIBER,) LIQD Place 237 mLs into feeding tube 6 (six) times daily. 1000 mL 5  . ondansetron (ZOFRAN) 8 MG tablet Take 1 tablet (8 mg total) by mouth every 8 (eight) hours as needed for nausea or vomiting (start 3 days; after chemo). 40 tablet 0  . oxyCODONE (ROXICODONE) 5 MG/5ML solution Place 5 mLs (5 mg total) into feeding tube every 8 (eight) hours as needed for severe pain. 100 mL 0  . prochlorperazine (COMPAZINE) 10 MG tablet Take 1 tablet (10 mg total) by mouth every 6 (six) hours as needed for nausea or vomiting. 40 tablet 0   No current facility-administered medications for this visit.   Facility-Administered Medications Ordered in Other Visits  Medication Dose Route Frequency Provider Last Rate Last Dose  . heparin lock flush 100 unit/mL  500 Units Intravenous Once Cammie Sickle, MD      . sodium chloride 0.9 % injection 10 mL  10 mL Intracatheter PRN Cammie Sickle, MD   10 mL at 03/04/15 0933  . sodium chloride flush (NS) 0.9 % injection 10 mL  10 mL Intravenous PRN Cammie Sickle, MD          .  PHYSICAL EXAMINATION: ECOG PERFORMANCE STATUS: 1 - Symptomatic but completely ambulatory  Filed Vitals:   06/08/15 0902  BP: 108/70  Pulse: 78  Temp: 98.7 F (37.1 C)   Filed Weights   06/08/15 0902  Weight: 135 lb 9.3 oz (61.5 kg)    GENERAL: Thin built; moderately nourished. Alert, no distress and comfortable.   Accompanied by his daughter.  EYES: no pallor or icterus OROPHARYNX: no thrush or ulceration; good dentition  NECK: supple, no masses felt LYMPH:  3-4cm CM palpable  lymphadenopathy in the cervical on left side [stable] LUNGS: clear to auscultation and  No wheeze or crackles HEART/CVS: regular rate & rhythm and no murmurs; No lower extremity edema ABDOMEN: abdomen soft, non-tender and normal bowel sounds; PEG tube in place Musculoskeletal:no cyanosis of digits and no clubbing  PSYCH: alert & oriented x 3 with fluent speech NEURO: no focal motor/sensory deficits SKIN:  no rashes or significant lesions  LABORATORY DATA:  I have reviewed the data as listed Lab Results  Component Value Date   WBC 1.8* 06/08/2015   HGB 9.6* 06/08/2015   HCT 28.0* 06/08/2015   MCV 95.5 06/08/2015   PLT 39* 06/08/2015    Recent Labs  05/20/15 1237  05/25/15 0903 06/01/15 0945 06/08/15 0832  NA 147*  < > 143 130* 129*  K 4.6  < > 3.8 4.0 3.8  CL 112*  < > 114* 99* 98*  CO2 23  < >  '24 26 27  ' GLUCOSE 113*  < > 95 113* 112*  BUN 67*  < > 45* 36* 27*  CREATININE 2.28*  < > 2.07* 2.06* 1.86*  CALCIUM 9.4  < > 9.0 8.5* 8.4*  GFRNONAA 29*  < > 33* 33* 37*  GFRAA 34*  < > 38* 38* 43*  PROT 9.2*  --  7.9  --  7.3  ALBUMIN 4.4  --  4.1  --  3.8  AST 23  --  17  --  15  ALT 11*  --  18  --  9*  ALKPHOS 65  --  66  --  57  BILITOT 0.9  --  0.7  --  0.6  < > = values in this interval not displayed.   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 Patient has progressive disease in the esophagus/left neck on EGD. Patient and family understands the treatments are palliative/not curative. Patient tolerated cycle #1 very well.  # HOLD FOLFIRI cycle #2 today; platelets- low at 39.  White count slightly low at 1.8/but ANC 1.2; hemoglobin slightly low at 9.8 platelets normal. CMP- mild hyponatremia/chronic kidney disease 1.8 study. Pt will need to have a PET scan after 4 treatments.  # I recommend holding chemotherapy today; reevaluating in one week with CBC; and then proceed with chemotherapy if platelets are greater than  60. I'll also discontinue bolus 5-FU; which might be attributing patient's thrombocytopenia.  # Follow-up MD/ labs in 1 week/chemotherapy in 1 week  # 25 minutes face-to-face with the patient discussing the above plan of care; more than 50% of time spent on prognosis/ natural history; counseling and coordination.     Cammie Sickle, MD 06/08/2015 9:24 AM

## 2015-06-08 NOTE — Progress Notes (Signed)
Patient ambulates independently without assistance, brought to exam room 6, accompanied by his daughter.  Patient denies pain or discomfort, vitals stable and documented.  Medication rec updated, information provided by patient.

## 2015-06-08 NOTE — Progress Notes (Signed)
Plts 39. Treatment held on 3/6 due to platelets.

## 2015-06-10 ENCOUNTER — Inpatient Hospital Stay: Payer: Self-pay

## 2015-06-11 ENCOUNTER — Telehealth: Payer: Self-pay

## 2015-06-11 ENCOUNTER — Telehealth: Payer: Self-pay | Admitting: *Deleted

## 2015-06-11 ENCOUNTER — Other Ambulatory Visit: Payer: Self-pay | Admitting: *Deleted

## 2015-06-11 ENCOUNTER — Other Ambulatory Visit: Payer: Self-pay | Admitting: Urology

## 2015-06-11 DIAGNOSIS — K9423 Gastrostomy malfunction: Secondary | ICD-10-CM

## 2015-06-11 DIAGNOSIS — C154 Malignant neoplasm of middle third of esophagus: Secondary | ICD-10-CM

## 2015-06-11 NOTE — Telephone Encounter (Signed)
  Oncology Nurse Navigator Documentation  Navigator Location: [facility] (06/11/15 1000) Navigator Encounter Type: Telephone (06/11/15 1000)                                          Time Spent with Patient: 30 (06/11/15 1000)   Received call from Jeffrey [name]. He states his feeding tube is working slower than usual and is draining around insertion site while inserting supplement. PEG was inserted in 2/16 in IR. Lanetta Inch notified and will coordinate intervention

## 2015-06-11 NOTE — Telephone Encounter (Signed)
Got a call from [place] stating that pt having g tube feedings issues.  He does his feedings from gravity and it is taking a lot longer to get it in.  He also notices that when he is doing the tube feeding he sees tube feeding coming out around the insertion site of the tube.  I called and left message for Al[place] covering specials today and then I was told to call IR MD on call and I did call and speak to Dr. Laural Roes and he states that pt had the tube feeding and it was not the one that is preferred by the IR doctors and they now have the preferred tube and it sounds like from his sx he needs a new one put in.  He will speak to [facility] and have her call me and he has a spot for tom.  Ebony Hail called back and said I need to call scheduling and does it have to be tom. And I said with his sx. /dr. Laural Roes said it cna be done tom.  I asked her for a time and she states that is not her job and i should call scheduling and I did.  I called scheduling and i have to fill out a form and they faxed it to me and I filled it out and faxed it back and entered order in computer for tube insertion.  I got a call back from [place] and it can be done tom. And pt needs to be there at 8:30 and procedure at 9:30. I called pt and spoke to his wife who passes message to him while I am on the phone. He already got a call from specials about what time. I asked about him drinking fluids and  Pt drinks some water by mouth but TF is main nutrition.  I asked him not to drink anything in am. I asked him if he had a ride over tom.  He said he will drive himself and call wife if needed to get him and she is agreeable to that.

## 2015-06-12 ENCOUNTER — Ambulatory Visit
Admission: RE | Admit: 2015-06-12 | Discharge: 2015-06-12 | Disposition: A | Discharge status: Home / Self Care | Payer: Self-pay | Source: Ambulatory Visit | Attending: Internal Medicine | Admitting: Internal Medicine

## 2015-06-12 DIAGNOSIS — T859XXA Unspecified complication of internal prosthetic device, implant and graft, initial encounter: Secondary | ICD-10-CM | POA: Insufficient documentation

## 2015-06-12 DIAGNOSIS — K9423 Gastrostomy malfunction: Secondary | ICD-10-CM

## 2015-06-12 DIAGNOSIS — C154 Malignant neoplasm of middle third of esophagus: Secondary | ICD-10-CM

## 2015-06-12 MED ORDER — IOHEXOL 300 MG/ML  SOLN
30.0000 mL | Freq: Once | INTRAMUSCULAR | Status: AC | PRN
  Administered 2015-06-12: 15 mL

## 2015-06-12 MED ORDER — LIDOCAINE HCL (PF) 1 % IJ SOLN
INTRAMUSCULAR | Status: DC | PRN
  Administered 2015-06-12: 5 mL

## 2015-06-12 NOTE — Sedation Documentation (Signed)
No IV; no sedation. Tolerated well.  14 Fr gastrostomy tube inserted.

## 2015-06-12 NOTE — Procedures (Signed)
Gastrostomy replacement without difficulty  Complications:  None  Blood Loss: none  See dictation in canopy pacs

## 2015-06-15 ENCOUNTER — Inpatient Hospital Stay: Payer: Self-pay

## 2015-06-15 ENCOUNTER — Encounter: Payer: Self-pay | Admitting: Internal Medicine

## 2015-06-15 ENCOUNTER — Inpatient Hospital Stay (HOSPITAL_BASED_OUTPATIENT_CLINIC_OR_DEPARTMENT_OTHER): Payer: Self-pay | Admitting: Internal Medicine

## 2015-06-15 VITALS — BP 101/59 | HR 85 | Temp 98.8°F | Resp 18 | Ht 68.0 in | Wt 133.6 lb

## 2015-06-15 DIAGNOSIS — E46 Unspecified protein-calorie malnutrition: Secondary | ICD-10-CM

## 2015-06-15 DIAGNOSIS — Z8551 Personal history of malignant neoplasm of bladder: Secondary | ICD-10-CM

## 2015-06-15 DIAGNOSIS — C159 Malignant neoplasm of esophagus, unspecified: Secondary | ICD-10-CM

## 2015-06-15 DIAGNOSIS — I129 Hypertensive chronic kidney disease with stage 1 through stage 4 chronic kidney disease, or unspecified chronic kidney disease: Secondary | ICD-10-CM

## 2015-06-15 DIAGNOSIS — C154 Malignant neoplasm of middle third of esophagus: Secondary | ICD-10-CM

## 2015-06-15 DIAGNOSIS — J439 Emphysema, unspecified: Secondary | ICD-10-CM

## 2015-06-15 DIAGNOSIS — E871 Hypo-osmolality and hyponatremia: Secondary | ICD-10-CM

## 2015-06-15 DIAGNOSIS — F17210 Nicotine dependence, cigarettes, uncomplicated: Secondary | ICD-10-CM

## 2015-06-15 DIAGNOSIS — C7989 Secondary malignant neoplasm of other specified sites: Secondary | ICD-10-CM

## 2015-06-15 DIAGNOSIS — N183 Chronic kidney disease, stage 3 (moderate): Secondary | ICD-10-CM

## 2015-06-15 DIAGNOSIS — Z923 Personal history of irradiation: Secondary | ICD-10-CM

## 2015-06-15 DIAGNOSIS — Z803 Family history of malignant neoplasm of breast: Secondary | ICD-10-CM

## 2015-06-15 DIAGNOSIS — Z79899 Other long term (current) drug therapy: Secondary | ICD-10-CM

## 2015-06-15 DIAGNOSIS — Z931 Gastrostomy status: Secondary | ICD-10-CM

## 2015-06-15 DIAGNOSIS — R1310 Dysphagia, unspecified: Secondary | ICD-10-CM

## 2015-06-15 DIAGNOSIS — Z9221 Personal history of antineoplastic chemotherapy: Secondary | ICD-10-CM

## 2015-06-15 LAB — CBC WITH DIFFERENTIAL/PLATELET
BASOS ABS: 0 10*3/uL (ref 0–0.1)
BASOS PCT: 0 %
EOS PCT: 1 %
Eosinophils Absolute: 0 10*3/uL (ref 0–0.7)
HCT: 26.9 % — ABNORMAL LOW (ref 40.0–52.0)
Hemoglobin: 9.3 g/dL — ABNORMAL LOW (ref 13.0–18.0)
LYMPHS PCT: 25 %
Lymphs Abs: 0.7 10*3/uL — ABNORMAL LOW (ref 1.0–3.6)
MCH: 33.6 pg (ref 26.0–34.0)
MCHC: 34.6 g/dL (ref 32.0–36.0)
MCV: 97.3 fL (ref 80.0–100.0)
Monocytes Absolute: 0.4 10*3/uL (ref 0.2–1.0)
Monocytes Relative: 14 %
Neutro Abs: 1.6 10*3/uL (ref 1.4–6.5)
Neutrophils Relative %: 60 %
PLATELETS: 100 10*3/uL — AB (ref 150–440)
RBC: 2.76 MIL/uL — AB (ref 4.40–5.90)
RDW: 18.6 % — ABNORMAL HIGH (ref 11.5–14.5)
WBC: 2.7 10*3/uL — AB (ref 3.8–10.6)

## 2015-06-15 LAB — COMPREHENSIVE METABOLIC PANEL
ALBUMIN: 3.6 g/dL (ref 3.5–5.0)
ALT: 8 U/L — AB (ref 17–63)
AST: 14 U/L — AB (ref 15–41)
Alkaline Phosphatase: 59 U/L (ref 38–126)
Anion gap: 7 (ref 5–15)
BUN: 31 mg/dL — AB (ref 6–20)
CHLORIDE: 98 mmol/L — AB (ref 101–111)
CO2: 27 mmol/L (ref 22–32)
Calcium: 8.4 mg/dL — ABNORMAL LOW (ref 8.9–10.3)
Creatinine, Ser: 2.06 mg/dL — ABNORMAL HIGH (ref 0.61–1.24)
GFR calc Af Amer: 38 mL/min — ABNORMAL LOW (ref >60)
GFR calc non Af Amer: 33 mL/min — ABNORMAL LOW (ref >60)
GLUCOSE: 149 mg/dL — AB (ref 65–99)
POTASSIUM: 4.1 mmol/L (ref 3.5–5.1)
SODIUM: 132 mmol/L — AB (ref 135–145)
TOTAL PROTEIN: 7.4 g/dL (ref 6.5–8.1)
Total Bilirubin: 0.4 mg/dL (ref 0.3–1.2)

## 2015-06-15 MED ORDER — SODIUM CHLORIDE 0.9% FLUSH
10.0000 mL | Freq: Once | INTRAVENOUS | Status: AC
Start: 2015-06-15 — End: 2015-06-15
  Administered 2015-06-15: 10 mL via INTRAVENOUS
  Filled 2015-06-15: qty 10

## 2015-06-15 MED ORDER — SODIUM CHLORIDE 0.9 % IV SOLN
Freq: Once | INTRAVENOUS | Status: AC
  Administered 2015-06-15: 11:00:00 via INTRAVENOUS
  Filled 2015-06-15: qty 1000

## 2015-06-15 MED ORDER — ATROPINE SULFATE 1 MG/ML IJ SOLN
0.5000 mg | Freq: Once | INTRAMUSCULAR | Status: AC | PRN
  Administered 2015-06-15: 0.5 mg via INTRAVENOUS
  Filled 2015-06-15: qty 1

## 2015-06-15 MED ORDER — SODIUM CHLORIDE 0.9 % IV SOLN
10.0000 mg | Freq: Once | INTRAVENOUS | Status: AC
  Administered 2015-06-15: 10 mg via INTRAVENOUS
  Filled 2015-06-15: qty 1

## 2015-06-15 MED ORDER — PALONOSETRON HCL INJECTION 0.25 MG/5ML
0.2500 mg | Freq: Once | INTRAVENOUS | Status: AC
  Administered 2015-06-15: 0.25 mg via INTRAVENOUS
  Filled 2015-06-15: qty 5

## 2015-06-15 MED ORDER — HEPARIN SOD (PORK) LOCK FLUSH 100 UNIT/ML IV SOLN
500.0000 [IU] | Freq: Once | INTRAVENOUS | Status: DC
Start: 2015-06-15 — End: 2015-06-15

## 2015-06-15 MED ORDER — LEUCOVORIN CALCIUM INJECTION 350 MG
700.0000 mg | Freq: Once | INTRAVENOUS | Status: AC
  Administered 2015-06-15: 700 mg via INTRAVENOUS
  Filled 2015-06-15: qty 35

## 2015-06-15 MED ORDER — IRINOTECAN HCL CHEMO INJECTION 100 MG/5ML
300.0000 mg | Freq: Once | INTRAVENOUS | Status: AC
  Administered 2015-06-15: 300 mg via INTRAVENOUS
  Filled 2015-06-15: qty 5

## 2015-06-15 MED ORDER — SODIUM CHLORIDE 0.9 % IV SOLN
2400.0000 mg/m2 | INTRAVENOUS | Status: DC
  Administered 2015-06-15: 4150 mg via INTRAVENOUS
  Filled 2015-06-15: qty 83

## 2015-06-15 NOTE — Patient Instructions (Signed)

## 2015-06-15 NOTE — Progress Notes (Signed)
.[place] NOTE  Patient Care Team: Adin Hector, MD as PCP - General (Internal Medicine) Clent Jacks, RN as Registered Nurse  CHIEF COMPLAINTS/PURPOSE OF CONSULTATION:   # OCT 2016- SQUAMOUS CELL CA of middle esophagus; Stage IV [EGD bx]; PET- Esopahgeal uptake & 2 CM Left Cervical LN-MET SQUAM; Carbo-taxol [Nov 9th]with RT [Nov 14th];  Cont Weekly Carbo- RT; JAN 3rd- 2017-PET stable esophageal uptake/slight increase neck adenopathy- s/p local radiation to the neck with carboplatin-   # FEB 20th- START FOLFIRI   # TAXOL HELD sec to ACUTE INFUSION REACTIONS  # malnutrition s/p PEG tube  # CKD [stage III]; Hx of non-invasive bladder ca [s/p cystectomy with neo-bladder; [facility]]  HISTORY OF PRESENTING ILLNESS:  Jeffrey [name] 65 y.o. 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 Status post cycle #1  Approximately 3 weeks ago. A week ago cycle #2 was held because of thrombocytoepnia.    in the interim patient had this PEG tube replaced Because of malfunction.  Patient has noted improvement of his swallowing  Only take liquids after the first cycle.  He thinks his left neck lymph node is slightly improved.    Patient denies any diarrhea. Denies any infusion reaction with the chemotherapy. No nausea no vomiting. No diarrhea. Appetite is fair.;  Patient's weight is steady around 135 pounds.  ROS: A complete 10 point review of system is done which is negative except mentioned above in history of present illness  MEDICAL HISTORY:  Past Medical History  Diagnosis Date  . Hypertension   . Bladder cancer [facility])     s/p surgery and reconstruction  . Emphysema of lung ([place])   . Esophageal cancer ([place])   . Chronic kidney disease     stage 3    SURGICAL HISTORY: Past Surgical History  Procedure Laterality Date  . Bladder  removal    . Esophagogastroduodenoscopy (egd) with propofol N/A 01/21/2015    Procedure: ESOPHAGOGASTRODUODENOSCOPY (EGD) WITH PROPOFOL-looking in the esophagus stomach and upper small intestine to evaluate and treat;  Surgeon: [name], MD;  Location: [facility] ENDOSCOPY;  Service: Endoscopy;  Laterality: N/A;  . Gastrostomy w/ feeding tube Left     placed end of october 2016  . Portacath placement Right 02/04/2015    Procedure: INSERTION PORT-A-CATH;  Surgeon: [name], MD;  Location: [facility] ORS;  Service: General;  Laterality: Right;  . Lymph node biopsy N/A 02/04/2015    Procedure: LYMPH NODE BIOPSY;  Surgeon: [name], MD;  Location: [facility] ORS;  Service: General;  Laterality: N/A;  . Esophagogastroduodenoscopy N/A 05/22/2015    Procedure: ESOPHAGOGASTRODUODENOSCOPY (EGD);  Surgeon: Manya Silvas, MD;  Location: [facility] ENDOSCOPY;  Service: Endoscopy;  Laterality: N/A;    SOCIAL HISTORY: Social History   Social History  . Marital Status: Married    Spouse Name: N/A  . Number of Children: N/A  . Years of Education: N/A   Occupational History  . Not on file.   Social History Main Topics  . Smoking status: Current Every Day Smoker -- 0.50 packs/day    Types: Cigarettes  . Smokeless tobacco: Current User    Types: Chew  . Alcohol Use: No     Comment: quit drinking 3 months ago  . Drug Use: No  . Sexual Activity: Not on file   Other Topics Concern  . Not on file  Social History Narrative   Lives at home with wife. Independent at baseline.    FAMILY HISTORY: Family History  Problem Relation Age of Onset  . CAD Father   . Breast cancer Mother     ALLERGIES:  is allergic to aspirin and taxol.  MEDICATIONS:  Current Outpatient Prescriptions  Medication Sig Dispense Refill  . amLODipine (NORVASC) 10 MG tablet Place 1 tablet (10 mg total) into feeding tube daily. 30 tablet 0  . diphenoxylate-atropine (LOMOTIL) 2.5-0.025 MG tablet Take 1 tablet by mouth 4 (four)  times daily as needed for diarrhea or loose stools. Also take 1 pills 30-60 minutes prior to starting chemotherapy/week cycle. 40 tablet 3  . lidocaine-prilocaine (EMLA) cream Apply cream 1 hour before chemotherapy treatment, place a small amount of saran wrap over the cream to protect your clothing 30 g 1  . neomycin-bacitracin-polymyxin (NEOSPORIN) OINT Apply 1 application topically daily. 30 g 1  . Nutritional Supplements (FEEDING SUPPLEMENT, JEVITY 1.5 CAL/FIBER,) LIQD Place 237 mLs into feeding tube 6 (six) times daily. 1000 mL 5  . ondansetron (ZOFRAN) 8 MG tablet Take 1 tablet (8 mg total) by mouth every 8 (eight) hours as needed for nausea or vomiting (start 3 days; after chemo). 40 tablet 0  . oxyCODONE (ROXICODONE) 5 MG/5ML solution Place 5 mLs (5 mg total) into feeding tube every 8 (eight) hours as needed for severe pain. 100 mL 0  . prochlorperazine (COMPAZINE) 10 MG tablet Take 1 tablet (10 mg total) by mouth every 6 (six) hours as needed for nausea or vomiting. 40 tablet 0   No current facility-administered medications for this visit.   Facility-Administered Medications Ordered in Other Visits  Medication Dose Route Frequency Provider Last Rate Last Dose  . heparin lock flush 100 unit/mL  500 Units Intravenous Once Cammie Sickle, MD      . sodium chloride 0.9 % injection 10 mL  10 mL Intracatheter PRN Cammie Sickle, MD   10 mL at 03/04/15 0933      .  PHYSICAL EXAMINATION: ECOG PERFORMANCE STATUS: 1 - Symptomatic but completely ambulatory  Filed Vitals:   06/15/15 1029  BP: 101/59  Pulse: 85  Temp: 98.8 F (37.1 C)  Resp: 18   Filed Weights   06/15/15 1029  Weight: 133 lb 9.6 oz (60.6 kg)    GENERAL: Thin built; moderately nourished. Alert, no distress and comfortable.   Alone EYES: no pallor or icterus OROPHARYNX: no thrush or ulceration; good dentition  NECK: supple, no masses felt LYMPH:  3-4cm CM palpable lymphadenopathy in the cervical on left  side [stable] LUNGS: clear to auscultation and  No wheeze or crackles HEART/CVS: regular rate & rhythm and no murmurs; No lower extremity edema ABDOMEN: abdomen soft, non-tender and normal bowel sounds; PEG tube in place Musculoskeletal:no cyanosis of digits and no clubbing  PSYCH: alert & oriented x 3 with fluent speech NEURO: no focal motor/sensory deficits SKIN:  no rashes or significant lesions  LABORATORY DATA:  I have reviewed the data as listed Lab Results  Component Value Date   WBC 2.7* 06/15/2015   HGB 9.3* 06/15/2015   HCT 26.9* 06/15/2015   MCV 97.3 06/15/2015   PLT 100* 06/15/2015    Recent Labs  05/25/15 0903 06/01/15 0945 06/08/15 0832 06/15/15 0949  NA 143 130* 129* 132*  K 3.8 4.0 3.8 4.1  CL 114* 99* 98* 98*  CO2 _0 GLUCOSE 95 113* 112* 149*  BUN 45* 36*  27* 31*  CREATININE 2.07* 2.06* 1.86* 2.06*  CALCIUM 9.0 8.5* 8.4* 8.4*  GFRNONAA 33* 33* 37* 33*  GFRAA 38* 38* 43* 38*  PROT 7.9  --  7.3 7.4  ALBUMIN 4.1  --  3.8 3.6  AST 17  --  15 14*  ALT 18  --  9* 8*  ALKPHOS 66  --  57 59  BILITOT 0.7  --  0.6 0.4     ASSESSMENT & PLAN:   # 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 Patient has progressive disease in the esophagus/left neck on EGD. Patient and family understands the treatments are palliative/not curative. Patient tolerated cycle #1 very well;  However cycle #2 had to be held last week because of low platelets of 39.  #  3 weeks later  From his first cycle-  Today the platelets are 100;  White count is 2.7;  Hemoglobin stable around 10.  CMP chronic renal insufficiency creatinine around 2.  #  Proceed with cycle #2 today.  I have discontinued bolus 5-FU.  If bone marrow suppression is a continued problem-  Recommend  Cutting down the 5-FU  Infusion by 20% versus  Doing treatments every 3 weeks.  #  Patient follow-up with me in 2 weeks for cycle #3/ CBC CMP.  # PEG   malfunction. Status post  reinsertion.  # 25 minutes face-to-face with the patient discussing the above plan of care; more than 50% of time spent on prognosis/ natural history; counseling and coordination.     Cammie Sickle, MD 06/15/2015 11:03 AM

## 2015-06-15 NOTE — Progress Notes (Signed)
Pt here for chemo. States neg tube working great. Does feeding 5 times a day. No diarrhea. Bowels are good. He is only drinking fluids orally. No eating by mouth just tube feedings. No c/o. He did take nausea pill that md told him to take before chemo. He is not nauseated.

## 2015-06-17 ENCOUNTER — Inpatient Hospital Stay: Payer: Self-pay

## 2015-06-17 VITALS — BP 103/63 | HR 83 | Resp 20

## 2015-06-17 DIAGNOSIS — C154 Malignant neoplasm of middle third of esophagus: Secondary | ICD-10-CM

## 2015-06-17 MED ORDER — HEPARIN SOD (PORK) LOCK FLUSH 100 UNIT/ML IV SOLN
500.0000 [IU] | Freq: Once | INTRAVENOUS | Status: AC | PRN
  Administered 2015-06-17: 500 [IU]

## 2015-06-17 MED ORDER — SODIUM CHLORIDE 0.9% FLUSH
10.0000 mL | INTRAVENOUS | Status: DC | PRN
  Administered 2015-06-17: 10 mL
  Filled 2015-06-17: qty 10

## 2015-06-17 MED ORDER — HEPARIN SOD (PORK) LOCK FLUSH 100 UNIT/ML IV SOLN
INTRAVENOUS | Status: AC
  Filled 2015-06-17: qty 5

## 2015-06-19 ENCOUNTER — Ambulatory Visit
Admission: RE | Admit: 2015-06-19 | Discharge: 2015-06-19 | Disposition: A | Discharge status: Home / Self Care | Payer: Self-pay | Source: Ambulatory Visit | Attending: Radiation Oncology | Admitting: Radiation Oncology

## 2015-06-19 ENCOUNTER — Encounter: Payer: Self-pay | Admitting: Radiation Oncology

## 2015-06-19 VITALS — BP 120/78 | HR 105 | Temp 98.0°F | Resp 18 | Wt 127.5 lb

## 2015-06-19 DIAGNOSIS — C154 Malignant neoplasm of middle third of esophagus: Secondary | ICD-10-CM

## 2015-06-19 NOTE — Progress Notes (Signed)
Radiation Oncology Follow up Note  Name: Jeffrey [name]   Date:   06/19/2015 MRN:  [id number] DOB: [birth year]    This 65 y.o. male presents to the clinic today for follow-up for stage IV midesophagus squamous cell carcinoma status post palliative treatment to both esophagus and left neck.  REFERRING PROVIDER: Adin Hector, MD  HPI: Patient is a 65 year old male now one month out having completed palliative radiation therapy to his left neck for metastatic focus of squamous cell carcinoma originating from the midesophagus. He is seen today in routine follow-up and is doing well. There is still some residual firmness in the left neck although it's decreased in size by about 75%. He still having some dysphasia although it is markedly improved since prior to radiation therapy and chemotherapy.. I believe he is still on carboplatinum therapy and is scheduled for follow-up appointment with medical oncology.  COMPLICATIONS OF TREATMENT: none  FOLLOW UP COMPLIANCE: keeps appointments   PHYSICAL EXAM:  BP 120/78 mmHg  Pulse 105  Temp(Src) 98 F (36.7 C)  Resp 18  Wt 127 lb 8.6 oz (57.85 kg) Thin cachectic male in NAD. Left neck node is markedly shrunken size about 75%. No other evidence of adenopathy in the neck region is noted. Patient is an active PEG placed and a Port-A-Cath in her right anterior chest Well-developed well-nourished patient in NAD. HEENT reveals PERLA, EOMI, discs not visualized.  Oral cavity is clear. No oral mucosal lesions are identified. Neck is clear without evidence of cervical or supraclavicular adenopathy. Lungs are clear to A&P. Cardiac examination is essentially unremarkable with regular rate and rhythm without murmur rub or thrill. Abdomen is benign with no organomegaly or masses noted. Motor sensory and DTR levels are equal and symmetric in the upper and lower extremities. Cranial nerves II through XII are grossly intact. Proprioception is intact. No peripheral  adenopathy or edema is identified. No motor or sensory levels are noted. Crude visual fields are within normal range.  RADIOLOGY RESULTS: No current films for review  PLAN: Present time patient is achieved good palliation both his esophagus and left neck. I'm turning follow-up care over to medical oncology. We'll be happy to reevaluate patient at any time should further palliative treatment be indicated.  I would like to take this opportunity for allowing me to participate in the care of your patient.Armstead Peaks., MD

## 2015-06-22 ENCOUNTER — Other Ambulatory Visit: Payer: Self-pay

## 2015-06-22 ENCOUNTER — Ambulatory Visit: Payer: Self-pay | Admitting: Internal Medicine

## 2015-06-22 ENCOUNTER — Ambulatory Visit: Payer: Self-pay

## 2015-06-29 ENCOUNTER — Inpatient Hospital Stay: Payer: Self-pay

## 2015-06-29 ENCOUNTER — Inpatient Hospital Stay (HOSPITAL_BASED_OUTPATIENT_CLINIC_OR_DEPARTMENT_OTHER): Payer: Self-pay | Admitting: Internal Medicine

## 2015-06-29 VITALS — BP 113/70 | HR 87 | Temp 98.1°F | Resp 16 | Wt 129.6 lb

## 2015-06-29 DIAGNOSIS — Z9221 Personal history of antineoplastic chemotherapy: Secondary | ICD-10-CM

## 2015-06-29 DIAGNOSIS — N183 Chronic kidney disease, stage 3 (moderate): Secondary | ICD-10-CM

## 2015-06-29 DIAGNOSIS — Z8551 Personal history of malignant neoplasm of bladder: Secondary | ICD-10-CM

## 2015-06-29 DIAGNOSIS — E46 Unspecified protein-calorie malnutrition: Secondary | ICD-10-CM

## 2015-06-29 DIAGNOSIS — E871 Hypo-osmolality and hyponatremia: Secondary | ICD-10-CM

## 2015-06-29 DIAGNOSIS — C159 Malignant neoplasm of esophagus, unspecified: Secondary | ICD-10-CM

## 2015-06-29 DIAGNOSIS — R1310 Dysphagia, unspecified: Secondary | ICD-10-CM

## 2015-06-29 DIAGNOSIS — K9423 Gastrostomy malfunction: Secondary | ICD-10-CM

## 2015-06-29 DIAGNOSIS — C7989 Secondary malignant neoplasm of other specified sites: Secondary | ICD-10-CM

## 2015-06-29 DIAGNOSIS — I129 Hypertensive chronic kidney disease with stage 1 through stage 4 chronic kidney disease, or unspecified chronic kidney disease: Secondary | ICD-10-CM

## 2015-06-29 DIAGNOSIS — Z923 Personal history of irradiation: Secondary | ICD-10-CM

## 2015-06-29 DIAGNOSIS — R197 Diarrhea, unspecified: Secondary | ICD-10-CM

## 2015-06-29 DIAGNOSIS — Z79899 Other long term (current) drug therapy: Secondary | ICD-10-CM

## 2015-06-29 DIAGNOSIS — Z803 Family history of malignant neoplasm of breast: Secondary | ICD-10-CM

## 2015-06-29 DIAGNOSIS — Z931 Gastrostomy status: Secondary | ICD-10-CM

## 2015-06-29 DIAGNOSIS — C154 Malignant neoplasm of middle third of esophagus: Secondary | ICD-10-CM

## 2015-06-29 DIAGNOSIS — J439 Emphysema, unspecified: Secondary | ICD-10-CM

## 2015-06-29 DIAGNOSIS — F17210 Nicotine dependence, cigarettes, uncomplicated: Secondary | ICD-10-CM

## 2015-06-29 LAB — CBC WITH DIFFERENTIAL/PLATELET
BASOS ABS: 0 10*3/uL (ref 0–0.1)
BASOS PCT: 1 %
EOS PCT: 1 %
Eosinophils Absolute: 0.1 10*3/uL (ref 0–0.7)
HCT: 29.4 % — ABNORMAL LOW (ref 40.0–52.0)
Hemoglobin: 10 g/dL — ABNORMAL LOW (ref 13.0–18.0)
LYMPHS PCT: 20 %
Lymphs Abs: 0.9 10*3/uL — ABNORMAL LOW (ref 1.0–3.6)
MCH: 33.4 pg (ref 26.0–34.0)
MCHC: 34.1 g/dL (ref 32.0–36.0)
MCV: 98 fL (ref 80.0–100.0)
Monocytes Absolute: 0.8 10*3/uL (ref 0.2–1.0)
Monocytes Relative: 18 %
NEUTROS ABS: 2.7 10*3/uL (ref 1.4–6.5)
Neutrophils Relative %: 60 %
PLATELETS: 138 10*3/uL — AB (ref 150–440)
RBC: 3 MIL/uL — AB (ref 4.40–5.90)
RDW: 18.8 % — ABNORMAL HIGH (ref 11.5–14.5)
WBC: 4.5 10*3/uL (ref 3.8–10.6)

## 2015-06-29 LAB — COMPREHENSIVE METABOLIC PANEL
ALBUMIN: 4.2 g/dL (ref 3.5–5.0)
ALT: 8 U/L — AB (ref 17–63)
AST: 18 U/L (ref 15–41)
Alkaline Phosphatase: 62 U/L (ref 38–126)
Anion gap: 7 (ref 5–15)
BUN: 37 mg/dL — AB (ref 6–20)
CHLORIDE: 102 mmol/L (ref 101–111)
CO2: 26 mmol/L (ref 22–32)
CREATININE: 1.84 mg/dL — AB (ref 0.61–1.24)
Calcium: 8.4 mg/dL — ABNORMAL LOW (ref 8.9–10.3)
GFR calc Af Amer: 43 mL/min — ABNORMAL LOW (ref >60)
GFR, EST NON AFRICAN AMERICAN: 37 mL/min — AB (ref >60)
GLUCOSE: 120 mg/dL — AB (ref 65–99)
Potassium: 3.9 mmol/L (ref 3.5–5.1)
SODIUM: 135 mmol/L (ref 135–145)
Total Bilirubin: 0.6 mg/dL (ref 0.3–1.2)
Total Protein: 7.8 g/dL (ref 6.5–8.1)

## 2015-06-29 MED ORDER — SODIUM CHLORIDE 0.9 % IV SOLN
10.0000 mg | Freq: Once | INTRAVENOUS | Status: AC
  Administered 2015-06-29: 10 mg via INTRAVENOUS
  Filled 2015-06-29: qty 1

## 2015-06-29 MED ORDER — SODIUM CHLORIDE 0.9 % IV SOLN
2400.0000 mg/m2 | INTRAVENOUS | Status: DC
  Administered 2015-06-29: 4150 mg via INTRAVENOUS
  Filled 2015-06-29: qty 83

## 2015-06-29 MED ORDER — ATROPINE SULFATE 1 MG/ML IJ SOLN
0.5000 mg | Freq: Once | INTRAMUSCULAR | Status: AC | PRN
  Administered 2015-06-29: 0.5 mg via INTRAVENOUS
  Filled 2015-06-29: qty 1

## 2015-06-29 MED ORDER — PALONOSETRON HCL INJECTION 0.25 MG/5ML
0.2500 mg | Freq: Once | INTRAVENOUS | Status: AC
  Administered 2015-06-29: 0.25 mg via INTRAVENOUS
  Filled 2015-06-29: qty 5

## 2015-06-29 MED ORDER — LEUCOVORIN CALCIUM INJECTION 350 MG
700.0000 mg | Freq: Once | INTRAVENOUS | Status: AC
  Administered 2015-06-29: 700 mg via INTRAVENOUS
  Filled 2015-06-29: qty 35

## 2015-06-29 MED ORDER — IRINOTECAN HCL CHEMO INJECTION 100 MG/5ML
300.0000 mg | Freq: Once | INTRAVENOUS | Status: AC
  Administered 2015-06-29: 300 mg via INTRAVENOUS
  Filled 2015-06-29: qty 5

## 2015-06-29 MED ORDER — SODIUM CHLORIDE 0.9 % IV SOLN
Freq: Once | INTRAVENOUS | Status: AC
  Administered 2015-06-29: 12:00:00 via INTRAVENOUS
  Filled 2015-06-29: qty 1000

## 2015-06-29 MED ORDER — SODIUM CHLORIDE 0.9% FLUSH
10.0000 mL | INTRAVENOUS | Status: DC | PRN
  Administered 2015-06-29: 10 mL via INTRAVENOUS
  Filled 2015-06-29: qty 10

## 2015-06-29 NOTE — Progress Notes (Signed)
.[place] NOTE  Patient Care Team: Adin Hector, MD as PCP - General (Internal Medicine) Clent Jacks, RN as Registered Nurse  CHIEF COMPLAINTS/PURPOSE OF CONSULTATION:   # OCT 2016- SQUAMOUS CELL CA of middle esophagus; Stage IV [EGD bx]; PET- Esopahgeal uptake & 2 CM Left Cervical LN-MET SQUAM; Carbo-taxol [Nov 9th]with RT [Nov 14th];  Cont Weekly Carbo- RT; JAN 3rd- 2017-PET stable esophageal uptake/slight increase neck adenopathy- s/p local radiation to the neck with carboplatin-   # FEB 20th- START FOLFIRI   # TAXOL HELD sec to ACUTE INFUSION REACTIONS  # malnutrition s/p PEG tube  # CKD [stage III]; Hx of non-invasive bladder ca [s/p cystectomy with neo-bladder; [facility]]  HISTORY OF PRESENTING ILLNESS:  Jeffrey [name] 65 y.o. 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 Status post cycle #2.  Patient was fishing last week and with his family. He continues to have difficulty swallowing food; but is able to swallow water. He has 1-2 loose stools a day for which he takes Lomotil on an as needed.  Otherwise in general appetite is good. No nausea no vomiting. No skin rash or sores in the mouth. He feels his left neck adenopathy is improving.   ROS: A complete 10 point review of system is done which is negative except mentioned above in history of present illness  MEDICAL HISTORY:  Past Medical History  Diagnosis Date  . Hypertension   . Bladder cancer [facility])     s/p surgery and reconstruction  . Emphysema of lung ([place])   . Esophageal cancer ([place])   . Chronic kidney disease     stage 3    SURGICAL HISTORY: Past Surgical History  Procedure Laterality Date  . Bladder removal    . Esophagogastroduodenoscopy (egd) with propofol N/A 01/21/2015    Procedure: ESOPHAGOGASTRODUODENOSCOPY (EGD) WITH PROPOFOL-looking in  the esophagus stomach and upper small intestine to evaluate and treat;  Surgeon: [name], MD;  Location: [facility] ENDOSCOPY;  Service: Endoscopy;  Laterality: N/A;  . Gastrostomy w/ feeding tube Left     placed end of october 2016  . Portacath placement Right 02/04/2015    Procedure: INSERTION PORT-A-CATH;  Surgeon: [name], MD;  Location: [facility] ORS;  Service: General;  Laterality: Right;  . Lymph node biopsy N/A 02/04/2015    Procedure: LYMPH NODE BIOPSY;  Surgeon: [name], MD;  Location: [facility] ORS;  Service: General;  Laterality: N/A;  . Esophagogastroduodenoscopy N/A 05/22/2015    Procedure: ESOPHAGOGASTRODUODENOSCOPY (EGD);  Surgeon: Manya Silvas, MD;  Location: [facility] ENDOSCOPY;  Service: Endoscopy;  Laterality: N/A;    SOCIAL HISTORY: Social History   Social History  . Marital Status: Married    Spouse Name: N/A  . Number of Children: N/A  . Years of Education: N/A   Occupational History  . Not on file.   Social History Main Topics  . Smoking status: Current Every Day Smoker -- 0.50 packs/day    Types: Cigarettes  . Smokeless tobacco: Current User    Types: Chew  . Alcohol Use: No     Comment: quit drinking 3 months ago  . Drug Use: No  . Sexual Activity: Not on file   Other Topics Concern  . Not on file   Social History Narrative   Lives at home with wife. Independent at baseline.    FAMILY HISTORY: Family History  Problem Relation Age of Onset  . CAD Father   . Breast cancer Mother     ALLERGIES:  is allergic to aspirin and taxol.  MEDICATIONS:  Current Outpatient Prescriptions  Medication Sig Dispense Refill  . amLODipine (NORVASC) 10 MG tablet Place 1 tablet (10 mg total) into feeding tube daily. 30 tablet 0  . diphenoxylate-atropine (LOMOTIL) 2.5-0.025 MG tablet Take 1 tablet by mouth 4 (four) times daily as needed for diarrhea or loose stools. Also take 1 pills 30-60 minutes prior to starting chemotherapy/week cycle. 40 tablet 3  .  lidocaine-prilocaine (EMLA) cream Apply cream 1 hour before chemotherapy treatment, place a small amount of saran wrap over the cream to protect your clothing 30 g 1  . neomycin-bacitracin-polymyxin (NEOSPORIN) OINT Apply 1 application topically daily. 30 g 1  . Nutritional Supplements (FEEDING SUPPLEMENT, JEVITY 1.5 CAL/FIBER,) LIQD Place 237 mLs into feeding tube 6 (six) times daily. 1000 mL 5  . ondansetron (ZOFRAN) 8 MG tablet Take 1 tablet (8 mg total) by mouth every 8 (eight) hours as needed for nausea or vomiting (start 3 days; after chemo). 40 tablet 0  . oxyCODONE (ROXICODONE) 5 MG/5ML solution Place 5 mLs (5 mg total) into feeding tube every 8 (eight) hours as needed for severe pain. 100 mL 0  . prochlorperazine (COMPAZINE) 10 MG tablet Take 1 tablet (10 mg total) by mouth every 6 (six) hours as needed for nausea or vomiting. 40 tablet 0   No current facility-administered medications for this visit.   Facility-Administered Medications Ordered in Other Visits  Medication Dose Route Frequency Provider Last Rate Last Dose  . sodium chloride 0.9 % injection 10 mL  10 mL Intracatheter PRN Cammie Sickle, MD   10 mL at 03/04/15 0933  . sodium chloride flush (NS) 0.9 % injection 10 mL  10 mL Intravenous PRN Cammie Sickle, MD   10 mL at 06/29/15 1102      .  PHYSICAL EXAMINATION: ECOG PERFORMANCE STATUS: 1 - Symptomatic but completely ambulatory  Filed Vitals:   06/29/15 1119  BP: 113/70  Pulse: 87  Temp: 98.1 F (36.7 C)  Resp: 16   Filed Weights   06/29/15 1119  Weight: 129 lb 10.1 oz (58.8 kg)    GENERAL: Thin built; moderately nourished. Alert, no distress and comfortable.   Alone EYES: no pallor or icterus OROPHARYNX: no thrush or ulceration; good dentition  NECK: supple, no masses felt LYMPH:  ~2cm  CM palpable lymphadenopathy in the cervical on left side [improved].  LUNGS: clear to auscultation and  No wheeze or crackles HEART/CVS: regular rate & rhythm  and no murmurs; No lower extremity edema ABDOMEN: abdomen soft, non-tender and normal bowel sounds; PEG tube in place Musculoskeletal:no cyanosis of digits and no clubbing  PSYCH: alert & oriented x 3 with fluent speech NEURO: no focal motor/sensory deficits SKIN:  no rashes or significant lesions  LABORATORY DATA:  I have reviewed the data as listed Lab Results  Component Value Date   WBC 4.5 06/29/2015   HGB 10.0* 06/29/2015   HCT 29.4* 06/29/2015   MCV 98.0 06/29/2015   PLT 138* 06/29/2015    Recent Labs  06/08/15 0832 06/15/15 0949 06/29/15 1100  NA 129* 132* 135  K 3.8 4.1 3.9  CL 98* 98* 102  CO2 '27 27 26  ' GLUCOSE 112* 149* 120*  BUN 27* 31* 37*  CREATININE 1.86* 2.06* 1.84*  CALCIUM 8.4* 8.4* 8.4*  GFRNONAA 37* 33* 37*  GFRAA 43*  38* 43*  PROT 7.3 7.4 7.8  ALBUMIN 3.8 3.6 4.2  AST 15 14* 18  ALT 9* 8* 8*  ALKPHOS 57 59 62  BILITOT 0.6 0.4 0.6     ASSESSMENT & PLAN:   # 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 Patient had progressive disease in the esophagus/left neck on EGD in feb 2017. Patient and family understands the treatments are palliative/not curative. Patient tolerated cycle #2 very well. Clinically patient is noted to have response in the left neck.  # Today white count is 4.5 hemoglobin 10 platelets 138- platelets are improved after discontinuation of 5-FU bolus. We will continue FOLFIRI without the bolus every 2 weeks. We'll plan to do a repeat imaging after 4-6 cycles.  #  Patient follow-up with me in 2 weeks for cycle #4/ CBC CMP.  # Diarrhea grade 1 secondary to  Irinotecan; using Lomotil as needed.  # PEG   malfunction. Status post reinsertion. No weight loss noted.  # 25 minutes face-to-face with the patient discussing the above plan of care; more than 50% of time spent on prognosis/ natural history; counseling and coordination.     Cammie Sickle, MD 06/29/2015 11:45 AM

## 2015-06-29 NOTE — Progress Notes (Signed)
Patient still has 2 loose bowels a day and is taking Lomtil with relief.

## 2015-07-01 ENCOUNTER — Inpatient Hospital Stay: Payer: Self-pay

## 2015-07-01 VITALS — BP 97/64 | HR 88 | Resp 18

## 2015-07-01 DIAGNOSIS — C154 Malignant neoplasm of middle third of esophagus: Secondary | ICD-10-CM

## 2015-07-01 MED ORDER — SODIUM CHLORIDE 0.9% FLUSH
10.0000 mL | INTRAVENOUS | Status: DC | PRN
  Administered 2015-07-01: 10 mL
  Filled 2015-07-01: qty 10

## 2015-07-01 MED ORDER — HEPARIN SOD (PORK) LOCK FLUSH 100 UNIT/ML IV SOLN
500.0000 [IU] | Freq: Once | INTRAVENOUS | Status: AC | PRN
  Administered 2015-07-01: 500 [IU]
  Filled 2015-07-01: qty 5

## 2015-07-13 ENCOUNTER — Inpatient Hospital Stay: Payer: Self-pay

## 2015-07-13 ENCOUNTER — Inpatient Hospital Stay: Payer: Self-pay | Attending: Internal Medicine | Admitting: Internal Medicine

## 2015-07-13 VITALS — BP 125/77 | HR 91 | Temp 98.5°F | Resp 18 | Wt 124.6 lb

## 2015-07-13 DIAGNOSIS — C154 Malignant neoplasm of middle third of esophagus: Secondary | ICD-10-CM

## 2015-07-13 DIAGNOSIS — C159 Malignant neoplasm of esophagus, unspecified: Secondary | ICD-10-CM

## 2015-07-13 DIAGNOSIS — J439 Emphysema, unspecified: Secondary | ICD-10-CM | POA: Insufficient documentation

## 2015-07-13 DIAGNOSIS — Z8551 Personal history of malignant neoplasm of bladder: Secondary | ICD-10-CM | POA: Insufficient documentation

## 2015-07-13 DIAGNOSIS — Z5111 Encounter for antineoplastic chemotherapy: Secondary | ICD-10-CM | POA: Insufficient documentation

## 2015-07-13 DIAGNOSIS — N183 Chronic kidney disease, stage 3 (moderate): Secondary | ICD-10-CM | POA: Insufficient documentation

## 2015-07-13 DIAGNOSIS — Z79899 Other long term (current) drug therapy: Secondary | ICD-10-CM | POA: Insufficient documentation

## 2015-07-13 DIAGNOSIS — I129 Hypertensive chronic kidney disease with stage 1 through stage 4 chronic kidney disease, or unspecified chronic kidney disease: Secondary | ICD-10-CM | POA: Insufficient documentation

## 2015-07-13 DIAGNOSIS — Z931 Gastrostomy status: Secondary | ICD-10-CM | POA: Insufficient documentation

## 2015-07-13 DIAGNOSIS — Z808 Family history of malignant neoplasm of other organs or systems: Secondary | ICD-10-CM | POA: Insufficient documentation

## 2015-07-13 DIAGNOSIS — E46 Unspecified protein-calorie malnutrition: Secondary | ICD-10-CM | POA: Insufficient documentation

## 2015-07-13 DIAGNOSIS — Z923 Personal history of irradiation: Secondary | ICD-10-CM | POA: Insufficient documentation

## 2015-07-13 DIAGNOSIS — R634 Abnormal weight loss: Secondary | ICD-10-CM | POA: Insufficient documentation

## 2015-07-13 DIAGNOSIS — R197 Diarrhea, unspecified: Secondary | ICD-10-CM | POA: Insufficient documentation

## 2015-07-13 LAB — COMPREHENSIVE METABOLIC PANEL
ALBUMIN: 4.1 g/dL (ref 3.5–5.0)
ALK PHOS: 57 U/L (ref 38–126)
ALT: 12 U/L — ABNORMAL LOW (ref 17–63)
ANION GAP: 5 (ref 5–15)
AST: 17 U/L (ref 15–41)
BILIRUBIN TOTAL: 0.8 mg/dL (ref 0.3–1.2)
BUN: 42 mg/dL — ABNORMAL HIGH (ref 6–20)
CALCIUM: 8.4 mg/dL — AB (ref 8.9–10.3)
CO2: 25 mmol/L (ref 22–32)
Chloride: 100 mmol/L — ABNORMAL LOW (ref 101–111)
Creatinine, Ser: 2.01 mg/dL — ABNORMAL HIGH (ref 0.61–1.24)
GFR calc non Af Amer: 34 mL/min — ABNORMAL LOW (ref >60)
GFR, EST AFRICAN AMERICAN: 39 mL/min — AB (ref >60)
Glucose, Bld: 127 mg/dL — ABNORMAL HIGH (ref 65–99)
POTASSIUM: 3.7 mmol/L (ref 3.5–5.1)
SODIUM: 130 mmol/L — AB (ref 135–145)
TOTAL PROTEIN: 7.4 g/dL (ref 6.5–8.1)

## 2015-07-13 LAB — CBC WITH DIFFERENTIAL/PLATELET
BASOS PCT: 1 %
Basophils Absolute: 0 10*3/uL (ref 0–0.1)
EOS ABS: 0.2 10*3/uL (ref 0–0.7)
Eosinophils Relative: 4 %
HCT: 28.9 % — ABNORMAL LOW (ref 40.0–52.0)
HEMOGLOBIN: 9.9 g/dL — AB (ref 13.0–18.0)
LYMPHS ABS: 0.7 10*3/uL — AB (ref 1.0–3.6)
Lymphocytes Relative: 14 %
MCH: 33.3 pg (ref 26.0–34.0)
MCHC: 34.2 g/dL (ref 32.0–36.0)
MCV: 97.3 fL (ref 80.0–100.0)
MONO ABS: 0.9 10*3/uL (ref 0.2–1.0)
MONOS PCT: 16 %
NEUTROS PCT: 65 %
Neutro Abs: 3.6 10*3/uL (ref 1.4–6.5)
Platelets: 132 10*3/uL — ABNORMAL LOW (ref 150–440)
RBC: 2.97 MIL/uL — ABNORMAL LOW (ref 4.40–5.90)
RDW: 18.8 % — AB (ref 11.5–14.5)
WBC: 5.5 10*3/uL (ref 3.8–10.6)

## 2015-07-13 MED ORDER — IRINOTECAN HCL CHEMO INJECTION 100 MG/5ML
300.0000 mg | Freq: Once | INTRAVENOUS | Status: AC
  Administered 2015-07-13: 300 mg via INTRAVENOUS
  Filled 2015-07-13: qty 5

## 2015-07-13 MED ORDER — HEPARIN SOD (PORK) LOCK FLUSH 100 UNIT/ML IV SOLN: 500.0000 [IU] | Freq: Once | INTRAVENOUS | Status: DC | PRN

## 2015-07-13 MED ORDER — SODIUM CHLORIDE 0.9 % IV SOLN
Freq: Once | INTRAVENOUS | Status: AC
  Administered 2015-07-13: 11:00:00 via INTRAVENOUS
  Filled 2015-07-13: qty 1000

## 2015-07-13 MED ORDER — LEUCOVORIN CALCIUM INJECTION 350 MG
700.0000 mg | Freq: Once | INTRAMUSCULAR | Status: AC
  Administered 2015-07-13: 700 mg via INTRAVENOUS
  Filled 2015-07-13: qty 35

## 2015-07-13 MED ORDER — ATROPINE SULFATE 1 MG/ML IJ SOLN
0.5000 mg | Freq: Once | INTRAMUSCULAR | Status: AC | PRN
  Administered 2015-07-13: 0.5 mg via INTRAVENOUS
  Filled 2015-07-13: qty 1

## 2015-07-13 MED ORDER — PALONOSETRON HCL INJECTION 0.25 MG/5ML
0.2500 mg | Freq: Once | INTRAVENOUS | Status: AC
  Administered 2015-07-13: 0.25 mg via INTRAVENOUS
  Filled 2015-07-13: qty 5

## 2015-07-13 MED ORDER — DEXAMETHASONE SODIUM PHOSPHATE 100 MG/10ML IJ SOLN
10.0000 mg | Freq: Once | INTRAMUSCULAR | Status: AC
  Administered 2015-07-13: 10 mg via INTRAVENOUS
  Filled 2015-07-13: qty 1

## 2015-07-13 MED ORDER — FLUOROURACIL CHEMO INJECTION 5 GM/100ML
2400.0000 mg/m2 | INTRAVENOUS | Status: DC
  Administered 2015-07-13: 4150 mg via INTRAVENOUS
  Filled 2015-07-13: qty 83

## 2015-07-13 NOTE — Progress Notes (Signed)
.[name]  Patient Care Team: Adin Hector, MD as PCP - General (Internal Medicine) Clent Jacks, RN as Registered Nurse  CHIEF COMPLAINTS/PURPOSE OF CONSULTATION:   # OCT 2016- SQUAMOUS CELL CA of middle esophagus; Stage IV [EGD bx]; PET- Esopahgeal uptake & 2 CM Left Cervical LN-MET SQUAM; Carbo-taxol [Nov 9th]with RT [Nov 14th];  Cont Weekly Carbo- RT; JAN 3rd- 2017-PET stable esophageal uptake/slight increase neck adenopathy- s/p local radiation to the neck with carboplatin-   # FEB 20th- START FOLFIRI   # TAXOL HELD sec to ACUTE INFUSION REACTIONS  # malnutrition s/p PEG tube  # CKD [stage III]; Hx of non-invasive bladder ca [s/p cystectomy with neo-bladder; [facility]]  HISTORY OF PRESENTING ILLNESS:  Jeffrey [name] 65 y.o. 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 Status post cycle #3.  Patient states his neck lymph nodes continue to improve. He continues to have difficulty swallowing food; but is able to swallow water better. He has 1-2 loose stools a day; however he has not been taking antidiarrheal as recommended.  Otherwise in general appetite is good. No nausea no vomiting. No skin rash or sores in the mouth.  He is using PEG tube for his feeding. Otherwise denies any skin rash.   ROS: A complete 10 point review of system is done which is negative except mentioned above in history of present illness  MEDICAL HISTORY:  Past Medical History  Diagnosis Date  . Hypertension   . Bladder cancer [facility])     s/p surgery and reconstruction  . Emphysema of lung ([place])   . Esophageal cancer ([place])   . Chronic kidney disease     stage 3    SURGICAL HISTORY: Past Surgical History  Procedure Laterality Date  . Bladder removal    . Esophagogastroduodenoscopy (egd) with propofol N/A 01/21/2015    Procedure:  ESOPHAGOGASTRODUODENOSCOPY (EGD) WITH PROPOFOL-looking in the esophagus stomach and upper small intestine to evaluate and treat;  Surgeon: [name], MD;  Location: [facility] ENDOSCOPY;  Service: Endoscopy;  Laterality: N/A;  . Gastrostomy w/ feeding tube Left     placed end of october 2016  . Portacath placement Right 02/04/2015    Procedure: INSERTION PORT-A-CATH;  Surgeon: [name], MD;  Location: [facility] ORS;  Service: General;  Laterality: Right;  . Lymph node biopsy N/A 02/04/2015    Procedure: LYMPH NODE BIOPSY;  Surgeon: [name], MD;  Location: [facility] ORS;  Service: General;  Laterality: N/A;  . Esophagogastroduodenoscopy N/A 05/22/2015    Procedure: ESOPHAGOGASTRODUODENOSCOPY (EGD);  Surgeon: Manya Silvas, MD;  Location: [facility] ENDOSCOPY;  Service: Endoscopy;  Laterality: N/A;    SOCIAL HISTORY: Social History   Social History  . Marital Status: Married    Spouse Name: N/A  . Number of Children: N/A  . Years of Education: N/A   Occupational History  . Not on file.   Social History Main Topics  . Smoking status: Current Every Day Smoker -- 0.50 packs/day    Types: Cigarettes  . Smokeless tobacco: Current User    Types: Chew  . Alcohol Use: No     Comment: quit drinking 3 months ago  . Drug Use: No  . Sexual Activity: Not on file   Other Topics Concern  . Not on file   Social History Narrative   Lives at home with wife. Independent at baseline.  FAMILY HISTORY: Family History  Problem Relation Age of Onset  . CAD Father   . Breast cancer Mother     ALLERGIES:  is allergic to aspirin and taxol.  MEDICATIONS:  Current Outpatient Prescriptions  Medication Sig Dispense Refill  . amLODipine (NORVASC) 10 MG tablet Place 1 tablet (10 mg total) into feeding tube daily. 30 tablet 0  . diphenoxylate-atropine (LOMOTIL) 2.5-0.025 MG tablet Take 1 tablet by mouth 4 (four) times daily as needed for diarrhea or loose stools. Also take 1 pills 30-60 minutes prior  to starting chemotherapy/week cycle. 40 tablet 3  . lidocaine-prilocaine (EMLA) cream Apply cream 1 hour before chemotherapy treatment, place a small amount of saran wrap over the cream to protect your clothing 30 g 1  . neomycin-bacitracin-polymyxin (NEOSPORIN) OINT Apply 1 application topically daily. 30 g 1  . Nutritional Supplements (FEEDING SUPPLEMENT, JEVITY 1.5 CAL/FIBER,) LIQD Place 237 mLs into feeding tube 6 (six) times daily. 1000 mL 5  . ondansetron (ZOFRAN) 8 MG tablet Take 1 tablet (8 mg total) by mouth every 8 (eight) hours as needed for nausea or vomiting (start 3 days; after chemo). 40 tablet 0  . prochlorperazine (COMPAZINE) 10 MG tablet Take 1 tablet (10 mg total) by mouth every 6 (six) hours as needed for nausea or vomiting. 40 tablet 0  . oxyCODONE (ROXICODONE) 5 MG/5ML solution Place 5 mLs (5 mg total) into feeding tube every 8 (eight) hours as needed for severe pain. 100 mL 0   No current facility-administered medications for this visit.   Facility-Administered Medications Ordered in Other Visits  Medication Dose Route Frequency Provider Last Rate Last Dose  . sodium chloride 0.9 % injection 10 mL  10 mL Intracatheter PRN Cammie Sickle, MD   10 mL at 03/04/15 0933      .  PHYSICAL EXAMINATION: ECOG PERFORMANCE STATUS: 1 - Symptomatic but completely ambulatory  Filed Vitals:   07/13/15 0949  BP: 125/77  Pulse: 91  Temp: 98.5 F (36.9 C)  Resp: 18   Filed Weights   07/13/15 0949  Weight: 124 lb 9 oz (56.5 kg)    GENERAL: Thin built; moderately nourished. Alert, no distress and comfortable.   Alone EYES: no pallor or icterus OROPHARYNX: no thrush or ulceration; good dentition  NECK: supple, no masses felt LYMPH:  ~1-2cm  CM palpable lymphadenopathy in the cervical on left side [improved].  LUNGS: clear to auscultation and  No wheeze or crackles HEART/CVS: regular rate & rhythm and no murmurs; No lower extremity edema ABDOMEN: abdomen soft,  non-tender and normal bowel sounds; PEG tube in place Musculoskeletal:no cyanosis of digits and no clubbing  PSYCH: alert & oriented x 3 with fluent speech NEURO: no focal motor/sensory deficits SKIN:  no rashes or significant lesions  LABORATORY DATA:  I have reviewed the data as listed Lab Results  Component Value Date   WBC 5.5 07/13/2015   HGB 9.9* 07/13/2015   HCT 28.9* 07/13/2015   MCV 97.3 07/13/2015   PLT 132* 07/13/2015    Recent Labs  06/15/15 0949 06/29/15 1100 07/13/15 0930  NA 132* 135 130*  K 4.1 3.9 3.7  CL 98* 102 100*  CO2 _0 GLUCOSE 149* 120* 127*  BUN 31* 37* 42*  CREATININE 2.06* 1.84* 2.01*  CALCIUM 8.4* 8.4* 8.4*  GFRNONAA 33* 37* 34*  GFRAA 38* 43* 39*  PROT 7.4 7.8 7.4  ALBUMIN 3.6 4.2 4.1  AST 14* 18 17  ALT 8* 8* 12*  ALKPHOS 59 62 57  BILITOT 0.4 0.6 0.8     ASSESSMENT & PLAN:   # 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 Patient had progressive disease in the esophagus/left neck on EGD in feb 2017. Patient and family understands the treatments are palliative/not curative. Patient tolerated cycle #3 very well. Clinically patient is noted to have response in the left neck. Proceed with cycle #4 today. We will get a PET scan prior to cycle #5.  # Today white count is 5.5 hemoglobin  9.9 platelets 132- platelets are improved after discontinuation of 5-FU bolus. We will continue FOLFIRI without the bolus every 2 weeks. Proceed with cycle #4 today.  # Diarrhea grade 1 secondary to  Irinotecan; patient not using Lomotil. Recommended compliance  # Weight loss of 5 pounds question related to diarrhea./C above. Also await about PET scan.  # Continue PEG tube feeding.  # CKD- creatinine around 2.0. Fairly stable.  #  Patient follow-up with me in 2 weeks for cycle #5/ CBC CMP/PET scan.     Cammie Sickle, MD 07/13/2015 10:24 AM

## 2015-07-15 ENCOUNTER — Inpatient Hospital Stay: Payer: Self-pay

## 2015-07-15 VITALS — BP 111/74 | HR 76 | Temp 97.4°F | Resp 18

## 2015-07-15 DIAGNOSIS — C154 Malignant neoplasm of middle third of esophagus: Secondary | ICD-10-CM

## 2015-07-15 MED ORDER — HEPARIN SOD (PORK) LOCK FLUSH 100 UNIT/ML IV SOLN
500.0000 [IU] | Freq: Once | INTRAVENOUS | Status: AC | PRN
  Administered 2015-07-15: 500 [IU]
  Filled 2015-07-15: qty 5

## 2015-07-15 MED ORDER — SODIUM CHLORIDE 0.9% FLUSH
10.0000 mL | INTRAVENOUS | Status: DC | PRN
  Administered 2015-07-15: 10 mL
  Filled 2015-07-15: qty 10

## 2015-07-27 ENCOUNTER — Inpatient Hospital Stay: Payer: Self-pay

## 2015-07-27 ENCOUNTER — Inpatient Hospital Stay (HOSPITAL_BASED_OUTPATIENT_CLINIC_OR_DEPARTMENT_OTHER): Payer: Self-pay | Admitting: Internal Medicine

## 2015-07-27 VITALS — BP 102/68 | HR 81 | Temp 97.8°F | Resp 18 | Wt 126.3 lb

## 2015-07-27 DIAGNOSIS — Z808 Family history of malignant neoplasm of other organs or systems: Secondary | ICD-10-CM

## 2015-07-27 DIAGNOSIS — E46 Unspecified protein-calorie malnutrition: Secondary | ICD-10-CM

## 2015-07-27 DIAGNOSIS — R634 Abnormal weight loss: Secondary | ICD-10-CM

## 2015-07-27 DIAGNOSIS — Z79899 Other long term (current) drug therapy: Secondary | ICD-10-CM

## 2015-07-27 DIAGNOSIS — C159 Malignant neoplasm of esophagus, unspecified: Secondary | ICD-10-CM

## 2015-07-27 DIAGNOSIS — Z923 Personal history of irradiation: Secondary | ICD-10-CM

## 2015-07-27 DIAGNOSIS — Z8551 Personal history of malignant neoplasm of bladder: Secondary | ICD-10-CM

## 2015-07-27 DIAGNOSIS — C154 Malignant neoplasm of middle third of esophagus: Secondary | ICD-10-CM

## 2015-07-27 DIAGNOSIS — N183 Chronic kidney disease, stage 3 (moderate): Secondary | ICD-10-CM

## 2015-07-27 DIAGNOSIS — R197 Diarrhea, unspecified: Secondary | ICD-10-CM

## 2015-07-27 DIAGNOSIS — I129 Hypertensive chronic kidney disease with stage 1 through stage 4 chronic kidney disease, or unspecified chronic kidney disease: Secondary | ICD-10-CM

## 2015-07-27 DIAGNOSIS — Z931 Gastrostomy status: Secondary | ICD-10-CM

## 2015-07-27 DIAGNOSIS — J439 Emphysema, unspecified: Secondary | ICD-10-CM

## 2015-07-27 LAB — CBC WITH DIFFERENTIAL/PLATELET
BASOS PCT: 0 %
Basophils Absolute: 0 10*3/uL (ref 0–0.1)
EOS ABS: 0.2 10*3/uL (ref 0–0.7)
Eosinophils Relative: 3 %
HEMATOCRIT: 29.2 % — AB (ref 40.0–52.0)
HEMOGLOBIN: 10 g/dL — AB (ref 13.0–18.0)
LYMPHS ABS: 1.1 10*3/uL (ref 1.0–3.6)
Lymphocytes Relative: 21 %
MCH: 33.4 pg (ref 26.0–34.0)
MCHC: 34.3 g/dL (ref 32.0–36.0)
MCV: 97.6 fL (ref 80.0–100.0)
MONO ABS: 0.9 10*3/uL (ref 0.2–1.0)
MONOS PCT: 17 %
NEUTROS ABS: 3.1 10*3/uL (ref 1.4–6.5)
NEUTROS PCT: 59 %
Platelets: 138 10*3/uL — ABNORMAL LOW (ref 150–440)
RBC: 2.99 MIL/uL — ABNORMAL LOW (ref 4.40–5.90)
RDW: 19 % — AB (ref 11.5–14.5)
WBC: 5.3 10*3/uL (ref 3.8–10.6)

## 2015-07-27 LAB — COMPREHENSIVE METABOLIC PANEL
ALBUMIN: 4.2 g/dL (ref 3.5–5.0)
ALK PHOS: 85 U/L (ref 38–126)
ALT: 34 U/L (ref 17–63)
ANION GAP: 6 (ref 5–15)
AST: 19 U/L (ref 15–41)
BILIRUBIN TOTAL: 1.2 mg/dL (ref 0.3–1.2)
BUN: 34 mg/dL — ABNORMAL HIGH (ref 6–20)
CALCIUM: 8.7 mg/dL — AB (ref 8.9–10.3)
CO2: 25 mmol/L (ref 22–32)
Chloride: 101 mmol/L (ref 101–111)
Creatinine, Ser: 2.05 mg/dL — ABNORMAL HIGH (ref 0.61–1.24)
GFR calc non Af Amer: 33 mL/min — ABNORMAL LOW (ref >60)
GFR, EST AFRICAN AMERICAN: 38 mL/min — AB (ref >60)
Glucose, Bld: 105 mg/dL — ABNORMAL HIGH (ref 65–99)
POTASSIUM: 4 mmol/L (ref 3.5–5.1)
Sodium: 132 mmol/L — ABNORMAL LOW (ref 135–145)
TOTAL PROTEIN: 7.6 g/dL (ref 6.5–8.1)

## 2015-07-27 MED ORDER — ATROPINE SULFATE 1 MG/ML IJ SOLN
0.5000 mg | Freq: Once | INTRAMUSCULAR | Status: AC | PRN
  Administered 2015-07-27: 0.5 mg via INTRAVENOUS
  Filled 2015-07-27: qty 1

## 2015-07-27 MED ORDER — SODIUM CHLORIDE 0.9 % IV SOLN
10.0000 mg | Freq: Once | INTRAVENOUS | Status: AC
  Administered 2015-07-27: 10 mg via INTRAVENOUS
  Filled 2015-07-27: qty 1

## 2015-07-27 MED ORDER — PALONOSETRON HCL INJECTION 0.25 MG/5ML
0.2500 mg | Freq: Once | INTRAVENOUS | Status: AC
  Administered 2015-07-27: 0.25 mg via INTRAVENOUS
  Filled 2015-07-27: qty 5

## 2015-07-27 MED ORDER — IRINOTECAN HCL CHEMO INJECTION 100 MG/5ML
300.0000 mg | Freq: Once | INTRAVENOUS | Status: AC
  Administered 2015-07-27: 300 mg via INTRAVENOUS
  Filled 2015-07-27: qty 5

## 2015-07-27 MED ORDER — OXYCODONE HCL 5 MG/5ML PO SOLN: 5.0000 mg | Freq: Three times a day (TID) | ORAL | Status: DC | PRN

## 2015-07-27 MED ORDER — LEUCOVORIN CALCIUM INJECTION 350 MG
700.0000 mg | Freq: Once | INTRAMUSCULAR | Status: AC
  Administered 2015-07-27: 700 mg via INTRAVENOUS
  Filled 2015-07-27: qty 35

## 2015-07-27 MED ORDER — HEPARIN SOD (PORK) LOCK FLUSH 100 UNIT/ML IV SOLN: 500.0000 [IU] | Freq: Once | INTRAVENOUS | Status: DC

## 2015-07-27 MED ORDER — SODIUM CHLORIDE 0.9 % IV SOLN
2400.0000 mg/m2 | INTRAVENOUS | Status: DC
  Administered 2015-07-27: 4150 mg via INTRAVENOUS
  Filled 2015-07-27: qty 83

## 2015-07-27 MED ORDER — SODIUM CHLORIDE 0.9 % IV SOLN
Freq: Once | INTRAVENOUS | Status: AC
  Administered 2015-07-27: 11:00:00 via INTRAVENOUS
  Filled 2015-07-27: qty 1000

## 2015-07-27 MED ORDER — SODIUM CHLORIDE 0.9% FLUSH
10.0000 mL | Freq: Once | INTRAVENOUS | Status: AC
  Administered 2015-07-27: 10 mL via INTRAVENOUS
  Filled 2015-07-27: qty 10

## 2015-07-27 MED ORDER — HEPARIN SOD (PORK) LOCK FLUSH 100 UNIT/ML IV SOLN: 500.0000 [IU] | Freq: Once | INTRAVENOUS | Status: DC | PRN

## 2015-07-27 MED ORDER — SODIUM CHLORIDE 0.9% FLUSH
10.0000 mL | INTRAVENOUS | Status: DC | PRN
  Administered 2015-07-27: 10 mL
  Filled 2015-07-27: qty 10

## 2015-07-27 NOTE — Progress Notes (Signed)
Patient requesting refill for Roxicodone 5 ml Solution.

## 2015-07-27 NOTE — Progress Notes (Signed)
.[place] NOTE  Patient Care Team: Adin Hector, MD as PCP - General (Internal Medicine) Clent Jacks, RN as Registered Nurse  CHIEF COMPLAINTS/PURPOSE OF CONSULTATION:   # OCT 2016- SQUAMOUS CELL CA of middle esophagus; Stage IV [EGD bx]; PET- Esopahgeal uptake & 2 CM Left Cervical LN-MET SQUAM; Carbo-taxol [Nov 9th]with RT [Nov 14th];  Cont Weekly Carbo- RT; JAN 3rd- 2017-PET stable esophageal uptake/slight increase neck adenopathy- s/p local radiation to the neck with carboplatin-   # FEB 20th- START FOLFIRI   # TAXOL HELD sec to ACUTE INFUSION REACTIONS  # malnutrition s/p PEG tube  # CKD [stage III]; Hx of non-invasive bladder ca [s/p cystectomy with neo-bladder; [facility]]  HISTORY OF PRESENTING ILLNESS:  Jeffrey [name] 65 y.o. 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 Status post cycle #4.   Patient got mixed up with his appointments for the treatment and his PET scan. He did not have his PET scan is planned. This has been rescheduled for the 26th. He is here for cycle #5 today.  He continues to feel his left neck lymph nodes are improving.   Patient's appetite is fair. He continues to use his PEG tube feedings.   He continues to have 1-2 loose stools a day. His weight is stable. Still having difficulty swallowing solid food; able to swallow liquids.   No nausea no vomiting. No skin rash or sores in the mouth. Otherwise denies any skin rash. Continues to have pain at the PEG tube site needing to use oxycodone as needed  ROS: A complete 10 point review of system is done which is negative except mentioned above in history of present illness  MEDICAL HISTORY:  Past Medical History  Diagnosis Date  . Hypertension   . Bladder cancer [facility])     s/p surgery and reconstruction  . Emphysema of lung ([place])   .  Esophageal cancer ([place])   . Chronic kidney disease     stage 3    SURGICAL HISTORY: Past Surgical History  Procedure Laterality Date  . Bladder removal    . Esophagogastroduodenoscopy (egd) with propofol N/A 01/21/2015    Procedure: ESOPHAGOGASTRODUODENOSCOPY (EGD) WITH PROPOFOL-looking in the esophagus stomach and upper small intestine to evaluate and treat;  Surgeon: [name], MD;  Location: [facility] ENDOSCOPY;  Service: Endoscopy;  Laterality: N/A;  . Gastrostomy w/ feeding tube Left     placed end of october 2016  . Portacath placement Right 02/04/2015    Procedure: INSERTION PORT-A-CATH;  Surgeon: [name], MD;  Location: [facility] ORS;  Service: General;  Laterality: Right;  . Lymph node biopsy N/A 02/04/2015    Procedure: LYMPH NODE BIOPSY;  Surgeon: [name], MD;  Location: [facility] ORS;  Service: General;  Laterality: N/A;  . Esophagogastroduodenoscopy N/A 05/22/2015    Procedure: ESOPHAGOGASTRODUODENOSCOPY (EGD);  Surgeon: Manya Silvas, MD;  Location[facility] ENDOSCOPY;  Service: Endoscopy;  Laterality: N/A;    SOCIAL HISTORY: Social History   Social History  . Marital Status: Married    Spouse Name: N/A  . Number of Children: N/A  . Years of Education: N/A   Occupational History  . Not on file.   Social History Main Topics  . Smoking status: Current Every Day Smoker -- 0.50 packs/day    Types: Cigarettes  . Smokeless tobacco: Current User    Types: Chew  . Alcohol  Use: No     Comment: quit drinking 3 months ago  . Drug Use: No  . Sexual Activity: Not on file   Other Topics Concern  . Not on file   Social History Narrative   Lives at home with wife. Independent at baseline.    FAMILY HISTORY: Family History  Problem Relation Age of Onset  . CAD Father   . Breast cancer Mother     ALLERGIES:  is allergic to aspirin and taxol.  MEDICATIONS:  Current Outpatient Prescriptions  Medication Sig Dispense Refill  . amLODipine (NORVASC) 10 MG tablet  Place 1 tablet (10 mg total) into feeding tube daily. 30 tablet 0  . diphenoxylate-atropine (LOMOTIL) 2.5-0.025 MG tablet Take 1 tablet by mouth 4 (four) times daily as needed for diarrhea or loose stools. Also take 1 pills 30-60 minutes prior to starting chemotherapy/week cycle. 40 tablet 3  . lidocaine-prilocaine (EMLA) cream Apply cream 1 hour before chemotherapy treatment, place a small amount of saran wrap over the cream to protect your clothing 30 g 1  . neomycin-bacitracin-polymyxin (NEOSPORIN) OINT Apply 1 application topically daily. 30 g 1  . Nutritional Supplements (FEEDING SUPPLEMENT, JEVITY 1.5 CAL/FIBER,) LIQD Place 237 mLs into feeding tube 6 (six) times daily. 1000 mL 5  . ondansetron (ZOFRAN) 8 MG tablet Take 1 tablet (8 mg total) by mouth every 8 (eight) hours as needed for nausea or vomiting (start 3 days; after chemo). 40 tablet 0  . oxyCODONE (ROXICODONE) 5 MG/5ML solution Place 5 mLs (5 mg total) into feeding tube every 8 (eight) hours as needed for severe pain. 100 mL 0  . prochlorperazine (COMPAZINE) 10 MG tablet Take 1 tablet (10 mg total) by mouth every 6 (six) hours as needed for nausea or vomiting. 40 tablet 0   No current facility-administered medications for this visit.   Facility-Administered Medications Ordered in Other Visits  Medication Dose Route Frequency Provider Last Rate Last Dose  . heparin lock flush 100 unit/mL  500 Units Intravenous Once Cammie Sickle, MD      . sodium chloride 0.9 % injection 10 mL  10 mL Intracatheter PRN Cammie Sickle, MD   10 mL at 03/04/15 0933      .  PHYSICAL EXAMINATION: ECOG PERFORMANCE STATUS: 1 - Symptomatic but completely ambulatory  Filed Vitals:   07/27/15 0952  BP: 102/68  Pulse: 81  Temp: 97.8 F (36.6 C)  Resp: 18   Filed Weights   07/27/15 0952  Weight: 126 lb 5.2 oz (57.3 kg)    GENERAL: Thin built; moderately nourished. Alert, no distress and comfortable.   Alone EYES: no pallor or  icterus OROPHARYNX: no thrush or ulceration; good dentition  NECK: supple, no masses felt LYMPH:  ~1-2cm  CM palpable lymphadenopathy in the cervical on left side [improved].  LUNGS: clear to auscultation and  No wheeze or crackles HEART/CVS: regular rate & rhythm and no murmurs; No lower extremity edema ABDOMEN: abdomen soft, non-tender and normal bowel sounds; PEG tube in place Musculoskeletal:no cyanosis of digits and no clubbing  PSYCH: alert & oriented x 3 with fluent speech NEURO: no focal motor/sensory deficits SKIN:  no rashes or significant lesions  LABORATORY DATA:  I have reviewed the data as listed Lab Results  Component Value Date   WBC 5.3 07/27/2015   HGB 10.0* 07/27/2015   HCT 29.2* 07/27/2015   MCV 97.6 07/27/2015   PLT 138* 07/27/2015    Recent Labs  06/29/15 1100 07/13/15 0930  07/27/15 0935  NA 135 130* 132*  K 3.9 3.7 4.0  CL 102 100* 101  CO2 '26 25 25  ' GLUCOSE 120* 127* 105*  BUN 37* 42* 34*  CREATININE 1.84* 2.01* 2.05*  CALCIUM 8.4* 8.4* 8.7*  GFRNONAA 37* 34* 33*  GFRAA 43* 39* 38*  PROT 7.8 7.4 7.6  ALBUMIN 4.2 4.1 4.2  AST '18 17 19  ' ALT 8* 12* 34  ALKPHOS 62 57 85  BILITOT 0.6 0.8 1.2     ASSESSMENT & PLAN:   # 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 Patient had progressive disease in the esophagus/left neck on EGD in feb 2017. Patient and family understands the treatments are palliative/not curative. Patient tolerated cycle #4 very well. Clinically patient is noted to have response in the left neck. We will get a PET In the next 2 DAYS.   # Today white count is 5.5 hemoglobin  10 platelets 138- platelets are improved after discontinuation of 5-FU bolus. We will continue FOLFIRI without the bolus every 2 weeks. Proceed with cycle #5 today.  # Diarrhea grade 1 secondary to  Irinotecan;   # weight loss- Stable ~126 pounds./C above. Also await about PET scan.  # Pain- on oxycodone liq as needed.   #  Continue PEG tube feeding.  # CKD- creatinine around 2.0. Fairly stable.  #  Patient follow-up with me in 2 weeks for cycle #6/ CBC CMP/PET in 2 days from now.      Cammie Sickle, MD 07/27/2015 10:18 AM

## 2015-07-29 ENCOUNTER — Inpatient Hospital Stay: Payer: Self-pay

## 2015-07-29 ENCOUNTER — Ambulatory Visit
Admission: RE | Admit: 2015-07-29 | Discharge: 2015-07-29 | Disposition: A | Discharge status: Home / Self Care | Payer: Self-pay | Source: Ambulatory Visit | Attending: Internal Medicine | Admitting: Internal Medicine

## 2015-07-29 VITALS — BP 107/68 | HR 80 | Temp 96.6°F | Resp 20

## 2015-07-29 DIAGNOSIS — R590 Localized enlarged lymph nodes: Secondary | ICD-10-CM | POA: Insufficient documentation

## 2015-07-29 DIAGNOSIS — C159 Malignant neoplasm of esophagus, unspecified: Secondary | ICD-10-CM | POA: Insufficient documentation

## 2015-07-29 DIAGNOSIS — C154 Malignant neoplasm of middle third of esophagus: Secondary | ICD-10-CM

## 2015-07-29 LAB — GLUCOSE, CAPILLARY: GLUCOSE-CAPILLARY: 91 mg/dL (ref 65–99)

## 2015-07-29 MED ORDER — HEPARIN SOD (PORK) LOCK FLUSH 100 UNIT/ML IV SOLN
500.0000 [IU] | Freq: Once | INTRAVENOUS | Status: AC | PRN
  Administered 2015-07-29: 500 [IU]

## 2015-07-29 MED ORDER — FLUDEOXYGLUCOSE F - 18 (FDG) INJECTION
12.5410 | Freq: Once | INTRAVENOUS | Status: AC | PRN
  Administered 2015-07-29: 12.541 via INTRAVENOUS

## 2015-07-29 MED ORDER — SODIUM CHLORIDE 0.9% FLUSH
10.0000 mL | INTRAVENOUS | Status: DC | PRN
  Administered 2015-07-29: 10 mL
  Filled 2015-07-29: qty 10

## 2015-07-29 MED ORDER — HEPARIN SOD (PORK) LOCK FLUSH 100 UNIT/ML IV SOLN
INTRAVENOUS | Status: AC
  Filled 2015-07-29: qty 5

## 2015-08-10 ENCOUNTER — Inpatient Hospital Stay: Payer: Self-pay

## 2015-08-10 ENCOUNTER — Inpatient Hospital Stay (HOSPITAL_BASED_OUTPATIENT_CLINIC_OR_DEPARTMENT_OTHER): Payer: Self-pay | Admitting: Internal Medicine

## 2015-08-10 ENCOUNTER — Inpatient Hospital Stay: Payer: Self-pay | Attending: Internal Medicine

## 2015-08-10 VITALS — BP 107/68 | HR 84 | Temp 98.1°F | Resp 18 | Wt 121.0 lb

## 2015-08-10 DIAGNOSIS — N183 Chronic kidney disease, stage 3 (moderate): Secondary | ICD-10-CM | POA: Insufficient documentation

## 2015-08-10 DIAGNOSIS — F17210 Nicotine dependence, cigarettes, uncomplicated: Secondary | ICD-10-CM | POA: Insufficient documentation

## 2015-08-10 DIAGNOSIS — Z931 Gastrostomy status: Secondary | ICD-10-CM

## 2015-08-10 DIAGNOSIS — T451X5S Adverse effect of antineoplastic and immunosuppressive drugs, sequela: Secondary | ICD-10-CM

## 2015-08-10 DIAGNOSIS — C159 Malignant neoplasm of esophagus, unspecified: Secondary | ICD-10-CM

## 2015-08-10 DIAGNOSIS — C770 Secondary and unspecified malignant neoplasm of lymph nodes of head, face and neck: Secondary | ICD-10-CM | POA: Insufficient documentation

## 2015-08-10 DIAGNOSIS — K521 Toxic gastroenteritis and colitis: Secondary | ICD-10-CM

## 2015-08-10 DIAGNOSIS — Z79899 Other long term (current) drug therapy: Secondary | ICD-10-CM | POA: Insufficient documentation

## 2015-08-10 DIAGNOSIS — J439 Emphysema, unspecified: Secondary | ICD-10-CM

## 2015-08-10 DIAGNOSIS — I129 Hypertensive chronic kidney disease with stage 1 through stage 4 chronic kidney disease, or unspecified chronic kidney disease: Secondary | ICD-10-CM

## 2015-08-10 DIAGNOSIS — C154 Malignant neoplasm of middle third of esophagus: Secondary | ICD-10-CM | POA: Insufficient documentation

## 2015-08-10 DIAGNOSIS — Z8551 Personal history of malignant neoplasm of bladder: Secondary | ICD-10-CM | POA: Insufficient documentation

## 2015-08-10 DIAGNOSIS — C801 Malignant (primary) neoplasm, unspecified: Secondary | ICD-10-CM

## 2015-08-10 DIAGNOSIS — R634 Abnormal weight loss: Secondary | ICD-10-CM

## 2015-08-10 DIAGNOSIS — Z923 Personal history of irradiation: Secondary | ICD-10-CM | POA: Insufficient documentation

## 2015-08-10 DIAGNOSIS — Z5111 Encounter for antineoplastic chemotherapy: Secondary | ICD-10-CM | POA: Insufficient documentation

## 2015-08-10 LAB — COMPREHENSIVE METABOLIC PANEL
ALT: 23 U/L (ref 17–63)
AST: 24 U/L (ref 15–41)
Albumin: 4 g/dL (ref 3.5–5.0)
Alkaline Phosphatase: 81 U/L (ref 38–126)
Anion gap: 11 (ref 5–15)
BILIRUBIN TOTAL: 0.8 mg/dL (ref 0.3–1.2)
BUN: 37 mg/dL — AB (ref 6–20)
CHLORIDE: 94 mmol/L — AB (ref 101–111)
CO2: 28 mmol/L (ref 22–32)
CREATININE: 1.97 mg/dL — AB (ref 0.61–1.24)
Calcium: 8.8 mg/dL — ABNORMAL LOW (ref 8.9–10.3)
GFR, EST AFRICAN AMERICAN: 40 mL/min — AB (ref >60)
GFR, EST NON AFRICAN AMERICAN: 34 mL/min — AB (ref >60)
Glucose, Bld: 133 mg/dL — ABNORMAL HIGH (ref 65–99)
Potassium: 3.4 mmol/L — ABNORMAL LOW (ref 3.5–5.1)
Sodium: 133 mmol/L — ABNORMAL LOW (ref 135–145)
TOTAL PROTEIN: 7.2 g/dL (ref 6.5–8.1)

## 2015-08-10 LAB — CBC WITH DIFFERENTIAL/PLATELET
Basophils Absolute: 0 10*3/uL (ref 0–0.1)
Basophils Relative: 1 %
Eosinophils Absolute: 0 10*3/uL (ref 0–0.7)
HEMATOCRIT: 28.1 % — AB (ref 40.0–52.0)
Hemoglobin: 9.6 g/dL — ABNORMAL LOW (ref 13.0–18.0)
LYMPHS ABS: 0.5 10*3/uL — AB (ref 1.0–3.6)
MCH: 33 pg (ref 26.0–34.0)
MCHC: 34.3 g/dL (ref 32.0–36.0)
MCV: 95.9 fL (ref 80.0–100.0)
MONO ABS: 0.4 10*3/uL (ref 0.2–1.0)
Monocytes Relative: 14 %
NEUTROS ABS: 2 10*3/uL (ref 1.4–6.5)
PLATELETS: 96 10*3/uL — AB (ref 150–440)
RBC: 2.93 MIL/uL — AB (ref 4.40–5.90)
RDW: 19 % — AB (ref 11.5–14.5)
WBC: 3 10*3/uL — ABNORMAL LOW (ref 3.8–10.6)

## 2015-08-10 MED ORDER — PALONOSETRON HCL INJECTION 0.25 MG/5ML
0.2500 mg | Freq: Once | INTRAVENOUS | Status: AC
  Administered 2015-08-10: 0.25 mg via INTRAVENOUS
  Filled 2015-08-10: qty 5

## 2015-08-10 MED ORDER — IRINOTECAN HCL CHEMO INJECTION 100 MG/5ML
300.0000 mg | Freq: Once | INTRAVENOUS | Status: AC
  Administered 2015-08-10: 300 mg via INTRAVENOUS
  Filled 2015-08-10: qty 5

## 2015-08-10 MED ORDER — SODIUM CHLORIDE 0.9% FLUSH
10.0000 mL | INTRAVENOUS | Status: DC | PRN
  Administered 2015-08-10: 10 mL via INTRAVENOUS
  Filled 2015-08-10: qty 10

## 2015-08-10 MED ORDER — ATROPINE SULFATE 1 MG/ML IJ SOLN
0.5000 mg | Freq: Once | INTRAMUSCULAR | Status: AC | PRN
  Administered 2015-08-10: 0.5 mg via INTRAVENOUS
  Filled 2015-08-10: qty 1

## 2015-08-10 MED ORDER — LEUCOVORIN CALCIUM INJECTION 350 MG
700.0000 mg | Freq: Once | INTRAVENOUS | Status: AC
  Administered 2015-08-10: 700 mg via INTRAVENOUS
  Filled 2015-08-10: qty 35

## 2015-08-10 MED ORDER — SODIUM CHLORIDE 0.9 % IV SOLN
2400.0000 mg/m2 | INTRAVENOUS | Status: DC
  Administered 2015-08-10: 4150 mg via INTRAVENOUS
  Filled 2015-08-10: qty 83

## 2015-08-10 MED ORDER — SODIUM CHLORIDE 0.9 % IV SOLN
Freq: Once | INTRAVENOUS | Status: AC
  Administered 2015-08-10: 11:00:00 via INTRAVENOUS
  Filled 2015-08-10: qty 1000

## 2015-08-10 MED ORDER — SODIUM CHLORIDE 0.9 % IV SOLN
10.0000 mg | Freq: Once | INTRAVENOUS | Status: AC
  Administered 2015-08-10: 10 mg via INTRAVENOUS
  Filled 2015-08-10: qty 1

## 2015-08-10 NOTE — Progress Notes (Signed)
#   Today white count is 3.0 hemoglobin 9.5 platelets 98. Platelets are improved after discontinuation of 5-FU bolus.We will continue FOLFIRI without the bolus every 2 weeks. Proceed with cycle #6 today.

## 2015-08-10 NOTE — Progress Notes (Signed)
Patient states he is needing refills on several of his medications.  Does not know which ones.  Instructed patient to call back to Triage for refills.

## 2015-08-10 NOTE — Progress Notes (Signed)
.[place] NOTE  Patient Care Team: Adin Hector, MD as PCP - General (Internal Medicine) Clent Jacks, RN as Registered Nurse  CHIEF COMPLAINTS/PURPOSE OF CONSULTATION:   # OCT 2016- SQUAMOUS CELL CA of middle esophagus; Stage IV [EGD bx]; PET- Esopahgeal uptake & 2 CM Left Cervical LN-MET SQUAM; Carbo-taxol [Nov 9th]with RT [Nov 14th];  Cont Weekly Carbo- RT; JAN 3rd- 2017-PET stable esophageal uptake/slight increase neck adenopathy- s/p local radiation to the neck with carboplatin-   # FEB 20th- START FOLFIRI s/p 5 cycles-May 2017- PET- Stable-esophageal uptake mediastinal LN/ Improved Left neck LN; cont FOLFIRI.Marland Kitchen   # TAXOL HELD sec to ACUTE INFUSION REACTIONS  # malnutrition s/p PEG tube  # CKD [stage III]; Hx of non-invasive bladder ca [s/p cystectomy with neo-bladder; [facility]]  HISTORY OF PRESENTING ILLNESS:  Jeffrey [name] 65 y.o. 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 Status post cycle #5/review the results of his restaging PET scan.  Patient continues to notice improvement of his left neck adenopathy. He states that his swallowing is improved on oral fluids. His not able to swallow any solids. He continues to use his PEG tube.  He continues to have 1-2 loose stools a day.  No nausea no vomiting. No skin rash or sores in the mouth. Otherwise denies any skin rash. Continues to have pain at the PEG tube site needing to use oxycodone as needed  ROS: A complete 10 point review of system is done which is negative except mentioned above in history of present illness  MEDICAL HISTORY:  Past Medical History  Diagnosis Date  . Hypertension   . Bladder cancer [facility])     s/p surgery and reconstruction  . Emphysema of lung ([place])   . Esophageal cancer ([place])   . Chronic kidney disease     stage 3    SURGICAL  HISTORY: Past Surgical History  Procedure Laterality Date  . Bladder removal    . Esophagogastroduodenoscopy (egd) with propofol N/A 01/21/2015    Procedure: ESOPHAGOGASTRODUODENOSCOPY (EGD) WITH PROPOFOL-looking in the esophagus stomach and upper small intestine to evaluate and treat;  Surgeon: [name], MD;  Location: [facility] ENDOSCOPY;  Service: Endoscopy;  Laterality: N/A;  . Gastrostomy w/ feeding tube Left     placed end of october 2016  . Portacath placement Right 02/04/2015    Procedure: INSERTION PORT-A-CATH;  Surgeon: [name], MD;  Location: [facility] ORS;  Service: General;  Laterality: Right;  . Lymph node biopsy N/A 02/04/2015    Procedure: LYMPH NODE BIOPSY;  Surgeon: [name], MD;  Location: [facility] ORS;  Service: General;  Laterality: N/A;  . Esophagogastroduodenoscopy N/A 05/22/2015    Procedure: ESOPHAGOGASTRODUODENOSCOPY (EGD);  Surgeon: Manya Silvas, MD;  Location: [facility] ENDOSCOPY;  Service: Endoscopy;  Laterality: N/A;    SOCIAL HISTORY: Social History   Social History  . Marital Status: Married    Spouse Name: N/A  . Number of Children: N/A  . Years of Education: N/A   Occupational History  . Not on file.   Social History Main Topics  . Smoking status: Current Every Day Smoker -- 0.50 packs/day    Types: Cigarettes  . Smokeless tobacco: Current User    Types: Chew  . Alcohol Use: No     Comment: quit drinking 3 months ago  . Drug Use: No  . Sexual Activity: Not on file  Other Topics Concern  . Not on file   Social History Narrative   Lives at home with wife. Independent at baseline.    FAMILY HISTORY: Family History  Problem Relation Age of Onset  . CAD Father   . Breast cancer Mother     ALLERGIES:  is allergic to aspirin and taxol.  MEDICATIONS:  Current Outpatient Prescriptions  Medication Sig Dispense Refill  . amLODipine (NORVASC) 10 MG tablet Place 1 tablet (10 mg total) into feeding tube daily. 30 tablet 0  .  diphenoxylate-atropine (LOMOTIL) 2.5-0.025 MG tablet Take 1 tablet by mouth 4 (four) times daily as needed for diarrhea or loose stools. Also take 1 pills 30-60 minutes prior to starting chemotherapy/week cycle. 40 tablet 3  . lidocaine-prilocaine (EMLA) cream Apply cream 1 hour before chemotherapy treatment, place a small amount of saran wrap over the cream to protect your clothing 30 g 1  . neomycin-bacitracin-polymyxin (NEOSPORIN) OINT Apply 1 application topically daily. 30 g 1  . Nutritional Supplements (FEEDING SUPPLEMENT, JEVITY 1.5 CAL/FIBER,) LIQD Place 237 mLs into feeding tube 6 (six) times daily. 1000 mL 5  . ondansetron (ZOFRAN) 8 MG tablet Take 1 tablet (8 mg total) by mouth every 8 (eight) hours as needed for nausea or vomiting (start 3 days; after chemo). 40 tablet 0  . oxyCODONE (ROXICODONE) 5 MG/5ML solution Place 5 mLs (5 mg total) into feeding tube every 8 (eight) hours as needed for severe pain. 100 mL 0  . prochlorperazine (COMPAZINE) 10 MG tablet Take 1 tablet (10 mg total) by mouth every 6 (six) hours as needed for nausea or vomiting. 40 tablet 0   No current facility-administered medications for this visit.   Facility-Administered Medications Ordered in Other Visits  Medication Dose Route Frequency Provider Last Rate Last Dose  . sodium chloride 0.9 % injection 10 mL  10 mL Intracatheter PRN Cammie Sickle, MD   10 mL at 03/04/15 0933  . sodium chloride flush (NS) 0.9 % injection 10 mL  10 mL Intravenous PRN Cammie Sickle, MD   10 mL at 08/10/15 0928      .  PHYSICAL EXAMINATION: ECOG PERFORMANCE STATUS: 1 - Symptomatic but completely ambulatory  Filed Vitals:   08/10/15 0954 08/10/15 0955  BP: 107/68   Pulse: 84   Temp: 98.1 F (36.7 C)   Resp:  18   Filed Weights   08/10/15 0954  Weight: 121 lb 0.5 oz (54.9 kg)    GENERAL: Thin built; moderately nourished. Alert, no distress and comfortable.   Alone EYES: no pallor or icterus OROPHARYNX: no  thrush or ulceration; good dentition  NECK: supple, no masses felt LYMPH:  ~1-2cm  CM palpable lymphadenopathy in the cervical on left side [improved].  LUNGS: clear to auscultation and  No wheeze or crackles HEART/CVS: regular rate & rhythm and no murmurs; No lower extremity edema ABDOMEN: abdomen soft, non-tender and normal bowel sounds; PEG tube in place Musculoskeletal:no cyanosis of digits and no clubbing  PSYCH: alert & oriented x 3 with fluent speech NEURO: no focal motor/sensory deficits SKIN:  no rashes or significant lesions  LABORATORY DATA:  I have reviewed the data as listed Lab Results  Component Value Date   WBC 3.0* 08/10/2015   HGB 9.6* 08/10/2015   HCT 28.1* 08/10/2015   MCV 95.9 08/10/2015   PLT 96* 08/10/2015    Recent Labs  07/13/15 0930 07/27/15 0935 08/10/15 0923  NA 130* 132* 133*  K 3.7 4.0 3.4*  CL 100* 101 94*  CO2 _0 GLUCOSE 127* 105* 133*  BUN 42* 34* 37*  CREATININE 2.01* 2.05* 1.97*  CALCIUM 8.4* 8.7* 8.8*  GFRNONAA 34* 33* 34*  GFRAA 39* 38* 40*  PROT 7.4 7.6 7.2  ALBUMIN 4.1 4.2 4.0  AST _1 ALT 12* 34 23  ALKPHOS 57 85 81  BILITOT 0.8 1.2 0.8     ASSESSMENT & PLAN:   # 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 Patient had progressive disease in the esophagus/left neck on EGD in feb 2017. Patient and family understands the treatments are palliative/not curative. Patient tolerated cycle #5 very well. Clinically patient is noted to have response in the left neck; And also improvement in swallowing. PET scan shows stable esophageal mass/mediastinal adenopathy. Improved left neck adenopathy.  # Today white count is 3.0 hemoglobin  9.5  platelets 98. Platelets are improved after discontinuation of 5-FU bolus.We will continue FOLFIRI without the bolus every 2 weeks. Proceed with cycle #6  today.  # Diarrhea grade 1 secondary to  Irinotecan; Stable.  # weight loss- Stable ~121 pounds/ recommend  increasing calories through PEG tube.  # Pain- on oxycodone liq as needed.   # Continue PEG tube feeding.  # CKD- creatinine around 2.0. Fairly stable.  #  Patient follow-up with me in 2 weeks for cycle #7 / CBC CMP.      Cammie Sickle, MD 08/10/2015 10:20 AM

## 2015-08-10 NOTE — Progress Notes (Signed)
Dr. Burlene Arnt gave order to continue with treatment today based on creatine of 1.97.

## 2015-08-12 ENCOUNTER — Inpatient Hospital Stay: Payer: Self-pay

## 2015-08-12 DIAGNOSIS — C154 Malignant neoplasm of middle third of esophagus: Secondary | ICD-10-CM

## 2015-08-12 MED ORDER — HEPARIN SOD (PORK) LOCK FLUSH 100 UNIT/ML IV SOLN
INTRAVENOUS | Status: AC
  Filled 2015-08-12: qty 5

## 2015-08-12 MED ORDER — HEPARIN SOD (PORK) LOCK FLUSH 100 UNIT/ML IV SOLN
500.0000 [IU] | Freq: Once | INTRAVENOUS | Status: AC | PRN
  Administered 2015-08-12: 500 [IU]

## 2015-08-12 MED ORDER — SODIUM CHLORIDE 0.9% FLUSH
10.0000 mL | INTRAVENOUS | Status: DC | PRN
  Administered 2015-08-12: 10 mL
  Filled 2015-08-12: qty 10

## 2015-08-24 ENCOUNTER — Inpatient Hospital Stay (HOSPITAL_BASED_OUTPATIENT_CLINIC_OR_DEPARTMENT_OTHER): Payer: Self-pay | Admitting: Internal Medicine

## 2015-08-24 ENCOUNTER — Inpatient Hospital Stay: Payer: Self-pay

## 2015-08-24 VITALS — BP 103/72 | HR 93 | Temp 97.2°F | Resp 18 | Wt 118.4 lb

## 2015-08-24 DIAGNOSIS — Z923 Personal history of irradiation: Secondary | ICD-10-CM

## 2015-08-24 DIAGNOSIS — N183 Chronic kidney disease, stage 3 (moderate): Secondary | ICD-10-CM

## 2015-08-24 DIAGNOSIS — Z79899 Other long term (current) drug therapy: Secondary | ICD-10-CM

## 2015-08-24 DIAGNOSIS — C154 Malignant neoplasm of middle third of esophagus: Secondary | ICD-10-CM

## 2015-08-24 DIAGNOSIS — T451X5S Adverse effect of antineoplastic and immunosuppressive drugs, sequela: Secondary | ICD-10-CM

## 2015-08-24 DIAGNOSIS — Z8551 Personal history of malignant neoplasm of bladder: Secondary | ICD-10-CM

## 2015-08-24 DIAGNOSIS — F17210 Nicotine dependence, cigarettes, uncomplicated: Secondary | ICD-10-CM

## 2015-08-24 DIAGNOSIS — C770 Secondary and unspecified malignant neoplasm of lymph nodes of head, face and neck: Secondary | ICD-10-CM

## 2015-08-24 DIAGNOSIS — Z931 Gastrostomy status: Secondary | ICD-10-CM

## 2015-08-24 DIAGNOSIS — J439 Emphysema, unspecified: Secondary | ICD-10-CM

## 2015-08-24 DIAGNOSIS — R634 Abnormal weight loss: Secondary | ICD-10-CM

## 2015-08-24 DIAGNOSIS — K521 Toxic gastroenteritis and colitis: Secondary | ICD-10-CM

## 2015-08-24 DIAGNOSIS — C159 Malignant neoplasm of esophagus, unspecified: Secondary | ICD-10-CM

## 2015-08-24 DIAGNOSIS — I129 Hypertensive chronic kidney disease with stage 1 through stage 4 chronic kidney disease, or unspecified chronic kidney disease: Secondary | ICD-10-CM

## 2015-08-24 LAB — CBC WITH DIFFERENTIAL/PLATELET
BASOS ABS: 0 10*3/uL (ref 0–0.1)
BASOS PCT: 1 %
EOS ABS: 0 10*3/uL (ref 0–0.7)
Eosinophils Relative: 0 %
HEMATOCRIT: 28.5 % — AB (ref 40.0–52.0)
HEMOGLOBIN: 9.9 g/dL — AB (ref 13.0–18.0)
Lymphocytes Relative: 22 %
Lymphs Abs: 0.7 10*3/uL — ABNORMAL LOW (ref 1.0–3.6)
MCH: 33.3 pg (ref 26.0–34.0)
MCHC: 34.7 g/dL (ref 32.0–36.0)
MCV: 95.9 fL (ref 80.0–100.0)
Monocytes Absolute: 0.6 10*3/uL (ref 0.2–1.0)
Monocytes Relative: 19 %
NEUTROS ABS: 1.8 10*3/uL (ref 1.4–6.5)
NEUTROS PCT: 58 %
Platelets: 106 10*3/uL — ABNORMAL LOW (ref 150–440)
RBC: 2.97 MIL/uL — AB (ref 4.40–5.90)
RDW: 18.4 % — ABNORMAL HIGH (ref 11.5–14.5)
WBC: 3.1 10*3/uL — AB (ref 3.8–10.6)

## 2015-08-24 LAB — COMPREHENSIVE METABOLIC PANEL
ALBUMIN: 4.1 g/dL (ref 3.5–5.0)
ALK PHOS: 64 U/L (ref 38–126)
ALT: 10 U/L — AB (ref 17–63)
AST: 22 U/L (ref 15–41)
Anion gap: 8 (ref 5–15)
BILIRUBIN TOTAL: 0.7 mg/dL (ref 0.3–1.2)
BUN: 44 mg/dL — ABNORMAL HIGH (ref 6–20)
CALCIUM: 8.8 mg/dL — AB (ref 8.9–10.3)
CO2: 30 mmol/L (ref 22–32)
CREATININE: 2.09 mg/dL — AB (ref 0.61–1.24)
Chloride: 91 mmol/L — ABNORMAL LOW (ref 101–111)
GFR calc non Af Amer: 32 mL/min — ABNORMAL LOW (ref >60)
GFR, EST AFRICAN AMERICAN: 37 mL/min — AB (ref >60)
GLUCOSE: 146 mg/dL — AB (ref 65–99)
Potassium: 3.8 mmol/L (ref 3.5–5.1)
SODIUM: 129 mmol/L — AB (ref 135–145)
TOTAL PROTEIN: 7.6 g/dL (ref 6.5–8.1)

## 2015-08-24 MED ORDER — PALONOSETRON HCL INJECTION 0.25 MG/5ML
0.2500 mg | Freq: Once | INTRAVENOUS | Status: AC
  Administered 2015-08-24: 0.25 mg via INTRAVENOUS
  Filled 2015-08-24: qty 5

## 2015-08-24 MED ORDER — SODIUM CHLORIDE 0.9 % IV SOLN
2400.0000 mg/m2 | INTRAVENOUS | Status: DC
  Administered 2015-08-24: 4150 mg via INTRAVENOUS
  Filled 2015-08-24: qty 83

## 2015-08-24 MED ORDER — HEPARIN SOD (PORK) LOCK FLUSH 100 UNIT/ML IV SOLN: 500.0000 [IU] | Freq: Once | INTRAVENOUS | Status: DC

## 2015-08-24 MED ORDER — SODIUM CHLORIDE 0.9% FLUSH
10.0000 mL | Freq: Once | INTRAVENOUS | Status: AC
  Administered 2015-08-24: 10 mL via INTRAVENOUS
  Filled 2015-08-24: qty 10

## 2015-08-24 MED ORDER — LEUCOVORIN CALCIUM INJECTION 350 MG
700.0000 mg | Freq: Once | INTRAMUSCULAR | Status: AC
  Administered 2015-08-24: 700 mg via INTRAVENOUS
  Filled 2015-08-24: qty 25

## 2015-08-24 MED ORDER — SODIUM CHLORIDE 0.9 % IV SOLN
10.0000 mg | Freq: Once | INTRAVENOUS | Status: AC
  Administered 2015-08-24: 10 mg via INTRAVENOUS
  Filled 2015-08-24: qty 1

## 2015-08-24 MED ORDER — DEXTROSE 5 % IV SOLN
300.0000 mg | Freq: Once | INTRAVENOUS | Status: AC
  Administered 2015-08-24: 300 mg via INTRAVENOUS
  Filled 2015-08-24: qty 5

## 2015-08-24 MED ORDER — ATROPINE SULFATE 1 MG/ML IJ SOLN
0.5000 mg | Freq: Once | INTRAMUSCULAR | Status: AC | PRN
  Administered 2015-08-24: 0.5 mg via INTRAVENOUS
  Filled 2015-08-24: qty 1

## 2015-08-24 MED ORDER — SODIUM CHLORIDE 0.9 % IV SOLN
Freq: Once | INTRAVENOUS | Status: AC
  Administered 2015-08-24: 11:00:00 via INTRAVENOUS
  Filled 2015-08-24: qty 1000

## 2015-08-24 NOTE — Progress Notes (Signed)
.[place] NOTE  Patient Care Team: Adin Hector, MD as PCP - General (Internal Medicine) Clent Jacks, RN as Registered Nurse  CHIEF COMPLAINTS/PURPOSE OF CONSULTATION:   # OCT 2016- SQUAMOUS CELL CA of middle esophagus; Stage IV [EGD bx]; PET- Esopahgeal uptake & 2 CM Left Cervical LN-MET SQUAM; Carbo-taxol [Nov 9th]with RT [Nov 14th];  Cont Weekly Carbo- RT; JAN 3rd- 2017-PET stable esophageal uptake/slight increase neck adenopathy- s/p local radiation to the neck with carboplatin-   # FEB 20th- START FOLFIRI s/p 5 cycles-May 2017- PET- Stable-esophageal uptake mediastinal LN/ Improved Left neck LN; cont FOLFIRI.Marland Kitchen   # TAXOL HELD sec to ACUTE INFUSION REACTIONS  # malnutrition s/p PEG tube  # CKD [stage III]; Hx of non-invasive bladder ca [s/p cystectomy with neo-bladder; [facility]]  HISTORY OF PRESENTING ILLNESS:  Jeffrey [name] 65 y.o. 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 Currently status post cycle #6 approximately 2 weeks ago.  Patient has maybe 1-2 loose stools a day; he continues to take Imodium as needed. He has lost 3 pounds since her last visit. Patient continues to notice improvement of his left neck adenopathy. He states that his swallowing is improved on oral fluids. His not able to swallow any solids. He continues to use his PEG tube. Continues to have pain at the PEG tube site needing to use oxycodone as needed  ROS: A complete 10 point review of system is done which is negative except mentioned above in history of present illness  MEDICAL HISTORY:  Past Medical History  Diagnosis Date  . Hypertension   . Bladder cancer [facility])     s/p surgery and reconstruction  . Emphysema of lung ([place])   . Esophageal cancer ([place])   . Chronic kidney disease     stage 3    SURGICAL HISTORY: Past Surgical History   Procedure Laterality Date  . Bladder removal    . Esophagogastroduodenoscopy (egd) with propofol N/A 01/21/2015    Procedure: ESOPHAGOGASTRODUODENOSCOPY (EGD) WITH PROPOFOL-looking in the esophagus stomach and upper small intestine to evaluate and treat;  Surgeon: [name], MD;  Location: [facility] ENDOSCOPY;  Service: Endoscopy;  Laterality: N/A;  . Gastrostomy w/ feeding tube Left     placed end of october 2016  . Portacath placement Right 02/04/2015    Procedure: INSERTION PORT-A-CATH;  Surgeon: [name], MD;  Location: [facility] ORS;  Service: General;  Laterality: Right;  . Lymph node biopsy N/A 02/04/2015    Procedure: LYMPH NODE BIOPSY;  Surgeon: [name], MD;  Location: [facility] ORS;  Service: General;  Laterality: N/A;  . Esophagogastroduodenoscopy N/A 05/22/2015    Procedure: ESOPHAGOGASTRODUODENOSCOPY (EGD);  Surgeon: Manya Silvas, MD;  Location: [facility] ENDOSCOPY;  Service: Endoscopy;  Laterality: N/A;    SOCIAL HISTORY: Social History   Social History  . Marital Status: Married    Spouse Name: N/A  . Number of Children: N/A  . Years of Education: N/A   Occupational History  . Not on file.   Social History Main Topics  . Smoking status: Current Every Day Smoker -- 0.50 packs/day    Types: Cigarettes  . Smokeless tobacco: Current User    Types: Chew  . Alcohol Use: No     Comment: quit drinking 3 months ago  . Drug Use: No  . Sexual Activity: Not on file   Other Topics Concern  .  Not on file   Social History Narrative   Lives at home with wife. Independent at baseline.    FAMILY HISTORY: Family History  Problem Relation Age of Onset  . CAD Father   . Breast cancer Mother     ALLERGIES:  is allergic to aspirin and taxol.  MEDICATIONS:  Current Outpatient Prescriptions  Medication Sig Dispense Refill  . amLODipine (NORVASC) 10 MG tablet Place 1 tablet (10 mg total) into feeding tube daily. 30 tablet 0  . diphenoxylate-atropine (LOMOTIL) 2.5-0.025 MG  tablet Take 1 tablet by mouth 4 (four) times daily as needed for diarrhea or loose stools. Also take 1 pills 30-60 minutes prior to starting chemotherapy/week cycle. 40 tablet 3  . lidocaine-prilocaine (EMLA) cream Apply cream 1 hour before chemotherapy treatment, place a small amount of saran wrap over the cream to protect your clothing 30 g 1  . neomycin-bacitracin-polymyxin (NEOSPORIN) OINT Apply 1 application topically daily. 30 g 1  . Nutritional Supplements (FEEDING SUPPLEMENT, JEVITY 1.5 CAL/FIBER,) LIQD Place 237 mLs into feeding tube 6 (six) times daily. 1000 mL 5  . ondansetron (ZOFRAN) 8 MG tablet Take 1 tablet (8 mg total) by mouth every 8 (eight) hours as needed for nausea or vomiting (start 3 days; after chemo). 40 tablet 0  . oxyCODONE (ROXICODONE) 5 MG/5ML solution Place 5 mLs (5 mg total) into feeding tube every 8 (eight) hours as needed for severe pain. 100 mL 0  . prochlorperazine (COMPAZINE) 10 MG tablet Take 1 tablet (10 mg total) by mouth every 6 (six) hours as needed for nausea or vomiting. 40 tablet 0   No current facility-administered medications for this visit.   Facility-Administered Medications Ordered in Other Visits  Medication Dose Route Frequency Provider Last Rate Last Dose  . heparin lock flush 100 unit/mL  500 Units Intravenous Once Cammie Sickle, MD      . sodium chloride 0.9 % injection 10 mL  10 mL Intracatheter PRN Cammie Sickle, MD   10 mL at 03/04/15 0933      .  PHYSICAL EXAMINATION: ECOG PERFORMANCE STATUS: 1 - Symptomatic but completely ambulatory  Filed Vitals:   08/24/15 1026  BP: 103/72  Pulse: 93  Temp: 97.2 F (36.2 C)  Resp: 18   Filed Weights   08/24/15 1026  Weight: 118 lb 6.2 oz (53.7 kg)    GENERAL: Thin built; moderately nourished. Alert, no distress and comfortable.   Alone EYES: no pallor or icterus OROPHARYNX: no thrush or ulceration; good dentition  NECK: supple, no masses felt LYMPH:  ~1-2cm  CM palpable  lymphadenopathy in the cervical on left side [improved].  LUNGS: clear to auscultation and  No wheeze or crackles HEART/CVS: regular rate & rhythm and no murmurs; No lower extremity edema ABDOMEN: abdomen soft, non-tender and normal bowel sounds; PEG tube in place Musculoskeletal:no cyanosis of digits and no clubbing  PSYCH: alert & oriented x 3 with fluent speech NEURO: no focal motor/sensory deficits SKIN:  no rashes or significant lesions  LABORATORY DATA:  I have reviewed the data as listed Lab Results  Component Value Date   WBC 3.1* 08/24/2015   HGB 9.9* 08/24/2015   HCT 28.5* 08/24/2015   MCV 95.9 08/24/2015   PLT 106* 08/24/2015    Recent Labs  07/27/15 0935 08/10/15 0923 08/24/15 1006  NA 132* 133* 129*  K 4.0 3.4* 3.8  CL 101 94* 91*  CO2 '25 28 30  ' GLUCOSE 105* 133* 146*  BUN 34* 37*  44*  CREATININE 2.05* 1.97* 2.09*  CALCIUM 8.7* 8.8* 8.8*  GFRNONAA 33* 34* 32*  GFRAA 38* 40* 37*  PROT 7.6 7.2 7.6  ALBUMIN 4.2 4.0 4.1  AST '19 24 22  ' ALT 34 23 10*  ALKPHOS 85 81 64  BILITOT 1.2 0.8 0.7     ASSESSMENT & PLAN:   # 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  # Today white count is 3.1 hemoglobin  9.9  platelets 106. Platelets are improved after discontinuation of 5-FU bolus.We will continue FOLFIRI without the bolus every 2 weeks. Creatinine 2. Steady.  Proceed with cycle #7  today.  # Diarrhea grade 1 secondary to  Irinotecan; Stable.  # weight loss- Stable ~121 pounds/ recommend increasing calories through PEG tube.  # Pain- on oxycodone liq as needed.   # Continue PEG tube feeding.  # CKD- creatinine around 2.0. Fairly stable.  #  Patient follow-up with me in 2 weeks for cycle #8 / CBC CMP.      Cammie Sickle, MD 08/24/2015 10:56 AM

## 2015-08-26 ENCOUNTER — Inpatient Hospital Stay: Payer: Self-pay

## 2015-08-26 DIAGNOSIS — C154 Malignant neoplasm of middle third of esophagus: Secondary | ICD-10-CM

## 2015-08-26 MED ORDER — HEPARIN SOD (PORK) LOCK FLUSH 100 UNIT/ML IV SOLN
INTRAVENOUS | Status: AC
  Filled 2015-08-26: qty 5

## 2015-08-26 MED ORDER — HEPARIN SOD (PORK) LOCK FLUSH 100 UNIT/ML IV SOLN
500.0000 [IU] | Freq: Once | INTRAVENOUS | Status: AC | PRN
  Administered 2015-08-26: 500 [IU]

## 2015-08-26 MED ORDER — SODIUM CHLORIDE 0.9% FLUSH
10.0000 mL | INTRAVENOUS | Status: DC | PRN
  Administered 2015-08-26: 10 mL
  Filled 2015-08-26: qty 10

## 2015-09-07 ENCOUNTER — Inpatient Hospital Stay: Payer: Self-pay | Attending: Internal Medicine

## 2015-09-07 ENCOUNTER — Inpatient Hospital Stay (HOSPITAL_BASED_OUTPATIENT_CLINIC_OR_DEPARTMENT_OTHER): Payer: Self-pay | Admitting: Internal Medicine

## 2015-09-07 ENCOUNTER — Inpatient Hospital Stay: Payer: Self-pay

## 2015-09-07 VITALS — BP 106/69 | HR 88 | Temp 98.4°F | Resp 18 | Wt 116.6 lb

## 2015-09-07 DIAGNOSIS — N183 Chronic kidney disease, stage 3 (moderate): Secondary | ICD-10-CM | POA: Insufficient documentation

## 2015-09-07 DIAGNOSIS — C801 Malignant (primary) neoplasm, unspecified: Secondary | ICD-10-CM

## 2015-09-07 DIAGNOSIS — C154 Malignant neoplasm of middle third of esophagus: Secondary | ICD-10-CM

## 2015-09-07 DIAGNOSIS — C159 Malignant neoplasm of esophagus, unspecified: Secondary | ICD-10-CM

## 2015-09-07 DIAGNOSIS — Z5111 Encounter for antineoplastic chemotherapy: Secondary | ICD-10-CM | POA: Insufficient documentation

## 2015-09-07 DIAGNOSIS — D6481 Anemia due to antineoplastic chemotherapy: Secondary | ICD-10-CM | POA: Insufficient documentation

## 2015-09-07 DIAGNOSIS — I129 Hypertensive chronic kidney disease with stage 1 through stage 4 chronic kidney disease, or unspecified chronic kidney disease: Secondary | ICD-10-CM | POA: Insufficient documentation

## 2015-09-07 DIAGNOSIS — J439 Emphysema, unspecified: Secondary | ICD-10-CM

## 2015-09-07 DIAGNOSIS — R634 Abnormal weight loss: Secondary | ICD-10-CM | POA: Insufficient documentation

## 2015-09-07 DIAGNOSIS — R110 Nausea: Secondary | ICD-10-CM | POA: Insufficient documentation

## 2015-09-07 DIAGNOSIS — C770 Secondary and unspecified malignant neoplasm of lymph nodes of head, face and neck: Secondary | ICD-10-CM

## 2015-09-07 DIAGNOSIS — R109 Unspecified abdominal pain: Secondary | ICD-10-CM

## 2015-09-07 DIAGNOSIS — E871 Hypo-osmolality and hyponatremia: Secondary | ICD-10-CM | POA: Insufficient documentation

## 2015-09-07 DIAGNOSIS — E46 Unspecified protein-calorie malnutrition: Secondary | ICD-10-CM | POA: Insufficient documentation

## 2015-09-07 DIAGNOSIS — Z8551 Personal history of malignant neoplasm of bladder: Secondary | ICD-10-CM | POA: Insufficient documentation

## 2015-09-07 DIAGNOSIS — R197 Diarrhea, unspecified: Secondary | ICD-10-CM

## 2015-09-07 DIAGNOSIS — Z931 Gastrostomy status: Secondary | ICD-10-CM | POA: Insufficient documentation

## 2015-09-07 DIAGNOSIS — Z923 Personal history of irradiation: Secondary | ICD-10-CM

## 2015-09-07 DIAGNOSIS — T451X5S Adverse effect of antineoplastic and immunosuppressive drugs, sequela: Secondary | ICD-10-CM | POA: Insufficient documentation

## 2015-09-07 DIAGNOSIS — Z79899 Other long term (current) drug therapy: Secondary | ICD-10-CM

## 2015-09-07 DIAGNOSIS — F17210 Nicotine dependence, cigarettes, uncomplicated: Secondary | ICD-10-CM | POA: Insufficient documentation

## 2015-09-07 LAB — COMPREHENSIVE METABOLIC PANEL
ALK PHOS: 54 U/L (ref 38–126)
ALT: 9 U/L — AB (ref 17–63)
AST: 21 U/L (ref 15–41)
Albumin: 3.7 g/dL (ref 3.5–5.0)
Anion gap: 8 (ref 5–15)
BUN: 41 mg/dL — ABNORMAL HIGH (ref 6–20)
CALCIUM: 8.5 mg/dL — AB (ref 8.9–10.3)
CO2: 29 mmol/L (ref 22–32)
CREATININE: 1.9 mg/dL — AB (ref 0.61–1.24)
Chloride: 90 mmol/L — ABNORMAL LOW (ref 101–111)
GFR, EST AFRICAN AMERICAN: 42 mL/min — AB (ref >60)
GFR, EST NON AFRICAN AMERICAN: 36 mL/min — AB (ref >60)
Glucose, Bld: 145 mg/dL — ABNORMAL HIGH (ref 65–99)
Potassium: 3.5 mmol/L (ref 3.5–5.1)
Sodium: 127 mmol/L — ABNORMAL LOW (ref 135–145)
Total Bilirubin: 0.6 mg/dL (ref 0.3–1.2)
Total Protein: 6.9 g/dL (ref 6.5–8.1)

## 2015-09-07 LAB — CBC WITH DIFFERENTIAL/PLATELET
Basophils Absolute: 0 10*3/uL (ref 0–0.1)
Basophils Relative: 0 %
Eosinophils Absolute: 0 10*3/uL (ref 0–0.7)
Eosinophils Relative: 0 %
HCT: 24.7 % — ABNORMAL LOW (ref 40.0–52.0)
HEMOGLOBIN: 8.6 g/dL — AB (ref 13.0–18.0)
LYMPHS ABS: 0.4 10*3/uL — AB (ref 1.0–3.6)
LYMPHS PCT: 21 %
MCH: 33.6 pg (ref 26.0–34.0)
MCHC: 34.7 g/dL (ref 32.0–36.0)
MCV: 96.9 fL (ref 80.0–100.0)
Monocytes Absolute: 0.5 10*3/uL (ref 0.2–1.0)
Monocytes Relative: 22 %
NEUTROS PCT: 57 %
Neutro Abs: 1.2 10*3/uL — ABNORMAL LOW (ref 1.4–6.5)
Platelets: 80 10*3/uL — ABNORMAL LOW (ref 150–440)
RBC: 2.55 MIL/uL — AB (ref 4.40–5.90)
RDW: 18.9 % — ABNORMAL HIGH (ref 11.5–14.5)
WBC: 2.1 10*3/uL — AB (ref 3.8–10.6)

## 2015-09-07 MED ORDER — SODIUM CHLORIDE 0.9 % IV SOLN
Freq: Once | INTRAVENOUS | Status: AC
  Administered 2015-09-07: 12:00:00 via INTRAVENOUS
  Filled 2015-09-07: qty 1000

## 2015-09-07 MED ORDER — ATROPINE SULFATE 1 MG/ML IJ SOLN
0.5000 mg | Freq: Once | INTRAMUSCULAR | Status: AC | PRN
  Administered 2015-09-07: 0.5 mg via INTRAVENOUS
  Filled 2015-09-07: qty 1

## 2015-09-07 MED ORDER — PALONOSETRON HCL INJECTION 0.25 MG/5ML
0.2500 mg | Freq: Once | INTRAVENOUS | Status: AC
  Administered 2015-09-07: 0.25 mg via INTRAVENOUS
  Filled 2015-09-07: qty 5

## 2015-09-07 MED ORDER — SODIUM CHLORIDE 0.9 % IV SOLN
10.0000 mg | Freq: Once | INTRAVENOUS | Status: AC
  Administered 2015-09-07: 10 mg via INTRAVENOUS
  Filled 2015-09-07: qty 1

## 2015-09-07 MED ORDER — SODIUM CHLORIDE 0.9% FLUSH
10.0000 mL | INTRAVENOUS | Status: DC | PRN
  Administered 2015-09-07: 10 mL via INTRAVENOUS
  Filled 2015-09-07: qty 10

## 2015-09-07 MED ORDER — PROCHLORPERAZINE MALEATE 10 MG PO TABS: 10.0000 mg | ORAL_TABLET | Freq: Four times a day (QID) | ORAL | Status: DC | PRN

## 2015-09-07 MED ORDER — IRINOTECAN HCL CHEMO INJECTION 100 MG/5ML
300.0000 mg | Freq: Once | INTRAVENOUS | Status: AC
  Administered 2015-09-07: 300 mg via INTRAVENOUS
  Filled 2015-09-07: qty 5

## 2015-09-07 MED ORDER — ONDANSETRON HCL 8 MG PO TABS: 8.0000 mg | ORAL_TABLET | Freq: Three times a day (TID) | ORAL | Status: DC | PRN

## 2015-09-07 MED ORDER — SODIUM CHLORIDE 0.9 % IV SOLN
2400.0000 mg/m2 | INTRAVENOUS | Status: DC
  Administered 2015-09-07: 4150 mg via INTRAVENOUS
  Filled 2015-09-07: qty 83

## 2015-09-07 MED ORDER — DIPHENOXYLATE-ATROPINE 2.5-0.025 MG PO TABS: 1.0000 | ORAL_TABLET | Freq: Four times a day (QID) | ORAL | Status: DC | PRN

## 2015-09-07 MED ORDER — LEUCOVORIN CALCIUM INJECTION 350 MG
700.0000 mg | Freq: Once | INTRAMUSCULAR | Status: AC
  Administered 2015-09-07: 700 mg via INTRAVENOUS
  Filled 2015-09-07: qty 35

## 2015-09-07 NOTE — Progress Notes (Signed)
MD notes:   Today white count is 2.3; ANC-1.1. hemoglobin 8.6 platelets 80. We will continue FOLFIRI without the bolus every 2 weeks. Creatinine 2. Steady. Proceed with cycle #8 Today. Recommend a PET scan after this cycle especially in the context of his continued weight loss

## 2015-09-07 NOTE — Progress Notes (Signed)
Patient c/o fatigue.  Patient requesting refills for diarrhea and nausea meds.

## 2015-09-07 NOTE — Progress Notes (Signed)
.[place] NOTE  Patient Care Team: Adin Hector, MD as PCP - General (Internal Medicine) Clent Jacks, RN as Registered Nurse  CHIEF COMPLAINTS/PURPOSE OF CONSULTATION:   # OCT 2016- SQUAMOUS CELL CA of middle esophagus; Stage IV [EGD bx]; PET- Esopahgeal uptake & 2 CM Left Cervical LN-MET SQUAM; Carbo-taxol [Nov 9th]with RT [Nov 14th];  Cont Weekly Carbo- RT; JAN 3rd- 2017-PET stable esophageal uptake/slight increase neck adenopathy- s/p local radiation to the neck with carboplatin-   # FEB 20th- START FOLFIRI s/p 5 cycles-May 2017- PET- Stable-esophageal uptake mediastinal LN/ Improved Left neck LN; cont FOLFIRI.  # TAXOL HELD sec to ACUTE INFUSION REACTIONS  # malnutrition s/p PEG tube  # CKD [stage III]; Hx of non-invasive bladder ca [s/p cystectomy with neo-bladder; [facility]]  HISTORY OF PRESENTING ILLNESS:  Jeffrey [name] 65 y.o. 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 Currently status post cycle #7  approximately 2 weeks ago.   Patient continues to lose weight currently 116 pounds.   He has 1-2 loose stools takes Imodium as needed.  No worsening of the neck lymphadenopathy. He is able to swallow fluids unable to swallow any solids. This is not any worse. Continue To use PEG tube.  Continues to have pain at the PEG tube site needing to use oxycodone as needed.  As per the wife patient is having frequent nausea 8 take nausea medications.  ROS: A complete 10 point review of system is done which is negative except mentioned above in history of present illness  MEDICAL HISTORY:  Past Medical History  Diagnosis Date  . Hypertension   . Bladder cancer [facility])     s/p surgery and reconstruction  . Emphysema of lung ([place])   . Esophageal cancer ([place])   . Chronic kidney disease     stage 3    SURGICAL HISTORY: Past  Surgical History  Procedure Laterality Date  . Bladder removal    . Esophagogastroduodenoscopy (egd) with propofol N/A 01/21/2015    Procedure: ESOPHAGOGASTRODUODENOSCOPY (EGD) WITH PROPOFOL-looking in the esophagus stomach and upper small intestine to evaluate and treat;  Surgeon: [name], MD;  Location: [facility] ENDOSCOPY;  Service: Endoscopy;  Laterality: N/A;  . Gastrostomy w/ feeding tube Left     placed end of october 2016  . Portacath placement Right 02/04/2015    Procedure: INSERTION PORT-A-CATH;  Surgeon: [name], MD;  Location: [facility] ORS;  Service: General;  Laterality: Right;  . Lymph node biopsy N/A 02/04/2015    Procedure: LYMPH NODE BIOPSY;  Surgeon: [name], MD;  Location: [facility] ORS;  Service: General;  Laterality: N/A;  . Esophagogastroduodenoscopy N/A 05/22/2015    Procedure: ESOPHAGOGASTRODUODENOSCOPY (EGD);  Surgeon: Manya Silvas, MD;  Location: [facility] ENDOSCOPY;  Service: Endoscopy;  Laterality: N/A;    SOCIAL HISTORY: Social History   Social History  . Marital Status: Married    Spouse Name: N/A  . Number of Children: N/A  . Years of Education: N/A   Occupational History  . Not on file.   Social History Main Topics  . Smoking status: Current Every Day Smoker -- 0.50 packs/day    Types: Cigarettes  . Smokeless tobacco: Current User    Types: Chew  . Alcohol Use: No     Comment: quit drinking 3 months ago  . Drug Use: No  . Sexual Activity: Not on file  Other Topics Concern  . Not on file   Social History Narrative   Lives at home with wife. Independent at baseline.    FAMILY HISTORY: Family History  Problem Relation Age of Onset  . CAD Father   . Breast cancer Mother     ALLERGIES:  is allergic to aspirin and taxol.  MEDICATIONS:  Current Outpatient Prescriptions  Medication Sig Dispense Refill  . amLODipine (NORVASC) 10 MG tablet Place 1 tablet (10 mg total) into feeding tube daily. 30 tablet 0  . diphenoxylate-atropine  (LOMOTIL) 2.5-0.025 MG tablet Take 1 tablet by mouth 4 (four) times daily as needed for diarrhea or loose stools. Also take 1 pills 30-60 minutes prior to starting chemotherapy/week cycle. 40 tablet 3  . lidocaine-prilocaine (EMLA) cream Apply cream 1 hour before chemotherapy treatment, place a small amount of saran wrap over the cream to protect your clothing 30 g 1  . neomycin-bacitracin-polymyxin (NEOSPORIN) OINT Apply 1 application topically daily. 30 g 1  . Nutritional Supplements (FEEDING SUPPLEMENT, JEVITY 1.5 CAL/FIBER,) LIQD Place 237 mLs into feeding tube 6 (six) times daily. 1000 mL 5  . ondansetron (ZOFRAN) 8 MG tablet Take 1 tablet (8 mg total) by mouth every 8 (eight) hours as needed for nausea or vomiting (start 3 days; after chemo). 40 tablet 3  . oxyCODONE (ROXICODONE) 5 MG/5ML solution Place 5 mLs (5 mg total) into feeding tube every 8 (eight) hours as needed for severe pain. 100 mL 0  . prochlorperazine (COMPAZINE) 10 MG tablet Take 1 tablet (10 mg total) by mouth every 6 (six) hours as needed for nausea or vomiting. 40 tablet 0   No current facility-administered medications for this visit.   Facility-Administered Medications Ordered in Other Visits  Medication Dose Route Frequency Provider Last Rate Last Dose  . sodium chloride 0.9 % injection 10 mL  10 mL Intracatheter PRN Cammie Sickle, MD   10 mL at 03/04/15 0933  . sodium chloride flush (NS) 0.9 % injection 10 mL  10 mL Intravenous PRN Cammie Sickle, MD   10 mL at 09/07/15 0954      .  PHYSICAL EXAMINATION: ECOG PERFORMANCE STATUS: 1 - Symptomatic but completely ambulatory  Filed Vitals:   09/07/15 1021  BP: 106/69  Pulse: 88  Temp: 98.4 F (36.9 C)  Resp: 18   Filed Weights   09/07/15 1021  Weight: 116 lb 10 oz (52.9 kg)    GENERAL: Thin built; moderately nourished. Alert, no distress and comfortable. Accompanied by his wife. EYES: no pallor or icterus OROPHARYNX: no thrush or ulceration;  good dentition  NECK: supple, no masses felt LYMPH:  ~1-2cm  CM palpable lymphadenopathy in the cervical on left side [improved].  LUNGS: clear to auscultation and  No wheeze or crackles HEART/CVS: regular rate & rhythm and no murmurs; No lower extremity edema ABDOMEN: abdomen soft, non-tender and normal bowel sounds; PEG tube in place Musculoskeletal:no cyanosis of digits and no clubbing  PSYCH: alert & oriented x 3 with fluent speech NEURO: no focal motor/sensory deficits SKIN:  no rashes or significant lesions  LABORATORY DATA:  I have reviewed the data as listed Lab Results  Component Value Date   WBC 2.1* 09/07/2015   HGB 8.6* 09/07/2015   HCT 24.7* 09/07/2015   MCV 96.9 09/07/2015   PLT 80* 09/07/2015    Recent Labs  08/10/15 0923 08/24/15 1006 09/07/15 0948  NA 133* 129* 127*  K 3.4* 3.8 3.5  CL 94* 91* 90*  CO2 '28 30 29  ' GLUCOSE 133* 146* 145*  BUN 37* 44* 41*  CREATININE 1.97* 2.09* 1.90*  CALCIUM 8.8* 8.8* 8.5*  GFRNONAA 34* 32* 36*  GFRAA 40* 37* 42*  PROT 7.2 7.6 6.9  ALBUMIN 4.0 4.1 3.7  AST '24 22 21  ' ALT 23 10* 9*  ALKPHOS 81 64 54  BILITOT 0.8 0.7 0.6     ASSESSMENT & PLAN:   # 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 April 26th 2017- PET scan shows stable esophageal mass/mediastinal adenopathy.  # Today white count is 2.3; ANC-1.1. hemoglobin  8.6  platelets 80. We will continue FOLFIRI without the bolus every 2 weeks. Creatinine 2. Steady.  Proceed with cycle #8   Today. Recommend a PET scan after this cycle especially in the context of his continued weight loss.  # Diarrhea grade 1 secondary to  Irinotecan; Stable.  # weight loss- continued weight loss; 116 pounds/ recommend increasing calories through PEG tube. Dietician. Question progression  # Pain- on oxycodone liq as  needed.   # CKD- creatinine around 2.0. Fairly stable.  # Continued nausea recommend Zofran and Compazine. New prescription given.  # Hyponatremia sodium 127; recommend increased fluid intake with sodium.   #  Patient follow-up with me in 2 weeks for cycle #9/ CBC CMP.   # 25 minutes face-to-face with the patient discussing the above plan of care; more than 50% of time spent on prognosis/ natural history; counseling and coordination.      Cammie Sickle, MD 09/07/2015 10:47 AM

## 2015-09-09 ENCOUNTER — Inpatient Hospital Stay: Payer: Self-pay

## 2015-09-09 DIAGNOSIS — C154 Malignant neoplasm of middle third of esophagus: Secondary | ICD-10-CM

## 2015-09-09 MED ORDER — HEPARIN SOD (PORK) LOCK FLUSH 100 UNIT/ML IV SOLN
500.0000 [IU] | Freq: Once | INTRAVENOUS | Status: AC | PRN
  Administered 2015-09-09: 500 [IU]
  Filled 2015-09-09: qty 5

## 2015-09-09 MED ORDER — SODIUM CHLORIDE 0.9% FLUSH
10.0000 mL | INTRAVENOUS | Status: DC | PRN
Start: 2015-09-09 — End: 2015-09-09
  Administered 2015-09-09: 10 mL
  Filled 2015-09-09: qty 10

## 2015-09-15 ENCOUNTER — Encounter: Admission: RE | Admit: 2015-09-15 | Payer: Self-pay | Source: Ambulatory Visit

## 2015-09-16 ENCOUNTER — Encounter: Payer: Self-pay | Attending: Internal Medicine | Admitting: Dietician

## 2015-09-16 NOTE — Patient Instructions (Addendum)
Jevity 1.5 6 cans per day  8am Jevity 1.5   1 1/2 cans 12 Jevity 1.5   1 1/2 cans  4pm Jevity 1.5   1 1/2 cans  8pm Jevity 1.5   1 1/2 cans  Oral nutrition: Try higher sodium liquids (ie gatorade, broths, etc)

## 2015-09-16 NOTE — Progress Notes (Signed)
Nutrition Assessment:  Visit Time: 11:10-11:40am  Reason for Visit: MD referral for wt loss with PEG tube. Unable to tolerate solid foods, liquids only  Assessment: Pt with history of esophageal cancer stage IV currently receiving chemotherapy.  PMHx: HTN, Bladder cancer, CKD stage 3, emphysema  Current Nutrition: Pt reports taking jevity 1.5 3-4 cans since tube placed (10/16) up until about 1 week ago increased to 5 cans of jevity 1.5.  Has tried 1 1/2 cans at first feeding in am and tolerated well.  Typically has taken 1 can at a time with 11ml free water flush before and after feeding.  Does not complain of nausea on visit today.  Does report 1-2 loose stool (watery) per day typically but reports no BM yesterday and none today so far.    Reports only taking water at this time orally (about 3 16 oz bottles per day) Has tried gatorade but sometimes it comes back up per pt.     Medications:  Reports taking nausea medication and lomotil daily  Labs: (6/5)  Na 127, glucose 145, BUN 41, creatinine 1.90, WBC 2.1, Hgb 8.6   Weight Change: 10% wt loss in the last 2 1/2 months Noted wt 3/27 129 pounds, 6/5 116 pounds Wt taken today 114 pounds Reports ht of 68 inches  Estimated Needs: Kcals: 1560-1820 kcals/d (52 kg) Protein: 78-104 g/d (1.5-2.0 g/kg) Fluid needs: 1560-1857ml/d  Intervention: -Recommend jevity 1.5 increased to 6 cans per day (1 1/2 cans 4 times per day). Will provide 2133 kcals, 90 gm of protein and 1080 ml free water.  Continue water flush of 60 ml before and after feeding (additional 467ml water) Written instructions given to pt. Pt reports dtr has been sending pt jevity 1.5 to his house as she lives in [place]. Has not had problem getting jevity 1.5 at this time.  -Discussed decreasing water intake and replacing with high sodium liquids.  Pt reports Dr. B had suggested to add more sodium to diet.  Discussed trying gatorade, broths, etc with increased Na  content.  Hadasah Brugger B. Zenia Resides, [place], [place] [phone] (pager) Weekend/On-Call pager [phone])  Will plan follow-up within 1.month.

## 2015-09-18 NOTE — Progress Notes (Signed)
Jeffrey [name], RD met with patient for medical nutrition therapy regarding his tube feeding and plans to follow-up in one month.

## 2015-09-21 ENCOUNTER — Inpatient Hospital Stay: Payer: Self-pay

## 2015-09-21 ENCOUNTER — Inpatient Hospital Stay (HOSPITAL_BASED_OUTPATIENT_CLINIC_OR_DEPARTMENT_OTHER): Payer: Self-pay | Admitting: Internal Medicine

## 2015-09-21 VITALS — BP 102/68 | HR 93 | Temp 97.8°F | Resp 18 | Wt 120.6 lb

## 2015-09-21 DIAGNOSIS — C159 Malignant neoplasm of esophagus, unspecified: Secondary | ICD-10-CM

## 2015-09-21 DIAGNOSIS — C154 Malignant neoplasm of middle third of esophagus: Secondary | ICD-10-CM

## 2015-09-21 DIAGNOSIS — E46 Unspecified protein-calorie malnutrition: Secondary | ICD-10-CM

## 2015-09-21 DIAGNOSIS — T451X5A Adverse effect of antineoplastic and immunosuppressive drugs, initial encounter: Secondary | ICD-10-CM

## 2015-09-21 DIAGNOSIS — Z923 Personal history of irradiation: Secondary | ICD-10-CM

## 2015-09-21 DIAGNOSIS — R634 Abnormal weight loss: Secondary | ICD-10-CM | POA: Insufficient documentation

## 2015-09-21 DIAGNOSIS — D6481 Anemia due to antineoplastic chemotherapy: Secondary | ICD-10-CM | POA: Insufficient documentation

## 2015-09-21 DIAGNOSIS — Z5111 Encounter for antineoplastic chemotherapy: Secondary | ICD-10-CM | POA: Insufficient documentation

## 2015-09-21 DIAGNOSIS — T451X5S Adverse effect of antineoplastic and immunosuppressive drugs, sequela: Secondary | ICD-10-CM

## 2015-09-21 DIAGNOSIS — C678 Malignant neoplasm of overlapping sites of bladder: Secondary | ICD-10-CM

## 2015-09-21 DIAGNOSIS — R109 Unspecified abdominal pain: Secondary | ICD-10-CM

## 2015-09-21 DIAGNOSIS — C770 Secondary and unspecified malignant neoplasm of lymph nodes of head, face and neck: Secondary | ICD-10-CM

## 2015-09-21 DIAGNOSIS — R110 Nausea: Secondary | ICD-10-CM

## 2015-09-21 DIAGNOSIS — Z79899 Other long term (current) drug therapy: Secondary | ICD-10-CM

## 2015-09-21 DIAGNOSIS — R197 Diarrhea, unspecified: Secondary | ICD-10-CM | POA: Insufficient documentation

## 2015-09-21 DIAGNOSIS — Z931 Gastrostomy status: Secondary | ICD-10-CM | POA: Insufficient documentation

## 2015-09-21 DIAGNOSIS — Z8551 Personal history of malignant neoplasm of bladder: Secondary | ICD-10-CM

## 2015-09-21 DIAGNOSIS — I129 Hypertensive chronic kidney disease with stage 1 through stage 4 chronic kidney disease, or unspecified chronic kidney disease: Secondary | ICD-10-CM

## 2015-09-21 DIAGNOSIS — N183 Chronic kidney disease, stage 3 (moderate): Secondary | ICD-10-CM

## 2015-09-21 DIAGNOSIS — E871 Hypo-osmolality and hyponatremia: Secondary | ICD-10-CM

## 2015-09-21 DIAGNOSIS — J439 Emphysema, unspecified: Secondary | ICD-10-CM

## 2015-09-21 DIAGNOSIS — F17210 Nicotine dependence, cigarettes, uncomplicated: Secondary | ICD-10-CM

## 2015-09-21 LAB — CBC WITH DIFFERENTIAL/PLATELET
Basophils Absolute: 0 10*3/uL (ref 0–0.1)
Basophils Relative: 1 %
EOS ABS: 0 10*3/uL (ref 0–0.7)
EOS PCT: 1 %
HCT: 24.1 % — ABNORMAL LOW (ref 40.0–52.0)
Hemoglobin: 8.1 g/dL — ABNORMAL LOW (ref 13.0–18.0)
LYMPHS ABS: 1 10*3/uL (ref 1.0–3.6)
Lymphocytes Relative: 32 %
MCH: 33.1 pg (ref 26.0–34.0)
MCHC: 33.8 g/dL (ref 32.0–36.0)
MCV: 98 fL (ref 80.0–100.0)
MONOS PCT: 23 %
Monocytes Absolute: 0.7 10*3/uL (ref 0.2–1.0)
Neutro Abs: 1.3 10*3/uL — ABNORMAL LOW (ref 1.4–6.5)
Neutrophils Relative %: 43 %
PLATELETS: 80 10*3/uL — AB (ref 150–440)
RBC: 2.45 MIL/uL — ABNORMAL LOW (ref 4.40–5.90)
RDW: 19.5 % — AB (ref 11.5–14.5)
WBC: 2.9 10*3/uL — ABNORMAL LOW (ref 3.8–10.6)

## 2015-09-21 LAB — COMPREHENSIVE METABOLIC PANEL
ALK PHOS: 58 U/L (ref 38–126)
ALT: 10 U/L — ABNORMAL LOW (ref 17–63)
ANION GAP: 10 (ref 5–15)
AST: 25 U/L (ref 15–41)
Albumin: 3.5 g/dL (ref 3.5–5.0)
BUN: 42 mg/dL — ABNORMAL HIGH (ref 6–20)
CALCIUM: 8.6 mg/dL — AB (ref 8.9–10.3)
CHLORIDE: 94 mmol/L — AB (ref 101–111)
CO2: 28 mmol/L (ref 22–32)
Creatinine, Ser: 1.56 mg/dL — ABNORMAL HIGH (ref 0.61–1.24)
GFR calc non Af Amer: 46 mL/min — ABNORMAL LOW (ref >60)
GFR, EST AFRICAN AMERICAN: 53 mL/min — AB (ref >60)
Glucose, Bld: 155 mg/dL — ABNORMAL HIGH (ref 65–99)
POTASSIUM: 3.6 mmol/L (ref 3.5–5.1)
SODIUM: 132 mmol/L — AB (ref 135–145)
Total Bilirubin: 0.5 mg/dL (ref 0.3–1.2)
Total Protein: 7.1 g/dL (ref 6.5–8.1)

## 2015-09-21 LAB — IRON AND TIBC
IRON: 34 ug/dL — AB (ref 45–182)
Saturation Ratios: 12 % — ABNORMAL LOW (ref 17.9–39.5)
TIBC: 291 ug/dL (ref 250–450)
UIBC: 257 ug/dL

## 2015-09-21 LAB — FERRITIN: FERRITIN: 429 ng/mL — AB (ref 24–336)

## 2015-09-21 MED ORDER — DEXAMETHASONE SODIUM PHOSPHATE 100 MG/10ML IJ SOLN
10.0000 mg | Freq: Once | INTRAMUSCULAR | Status: AC
  Administered 2015-09-21: 10 mg via INTRAVENOUS
  Filled 2015-09-21: qty 1

## 2015-09-21 MED ORDER — IRINOTECAN HCL CHEMO INJECTION 100 MG/5ML
300.0000 mg | Freq: Once | INTRAVENOUS | Status: AC
  Administered 2015-09-21: 300 mg via INTRAVENOUS
  Filled 2015-09-21: qty 5

## 2015-09-21 MED ORDER — LEUCOVORIN CALCIUM INJECTION 350 MG
700.0000 mg | Freq: Once | INTRAVENOUS | Status: AC
  Administered 2015-09-21: 700 mg via INTRAVENOUS
  Filled 2015-09-21: qty 35

## 2015-09-21 MED ORDER — PALONOSETRON HCL INJECTION 0.25 MG/5ML
0.2500 mg | Freq: Once | INTRAVENOUS | Status: AC
  Administered 2015-09-21: 0.25 mg via INTRAVENOUS
  Filled 2015-09-21: qty 5

## 2015-09-21 MED ORDER — SODIUM CHLORIDE 0.9 % IV SOLN
2400.0000 mg/m2 | INTRAVENOUS | Status: DC
  Administered 2015-09-21: 4150 mg via INTRAVENOUS
  Filled 2015-09-21: qty 83

## 2015-09-21 MED ORDER — HEPARIN SOD (PORK) LOCK FLUSH 100 UNIT/ML IV SOLN: 500.0000 [IU] | Freq: Once | INTRAVENOUS | Status: DC

## 2015-09-21 MED ORDER — ATROPINE SULFATE 1 MG/ML IJ SOLN
0.5000 mg | Freq: Once | INTRAMUSCULAR | Status: AC | PRN
Start: 2015-09-21 — End: 2015-09-21
  Administered 2015-09-21: 0.5 mg via INTRAVENOUS
  Filled 2015-09-21: qty 1

## 2015-09-21 MED ORDER — SODIUM CHLORIDE 0.9% FLUSH
10.0000 mL | Freq: Once | INTRAVENOUS | Status: AC
  Administered 2015-09-21: 10 mL via INTRAVENOUS
  Filled 2015-09-21: qty 10

## 2015-09-21 MED ORDER — SODIUM CHLORIDE 0.9 % IV SOLN
Freq: Once | INTRAVENOUS | Status: AC
  Administered 2015-09-21: 11:00:00 via INTRAVENOUS
  Filled 2015-09-21: qty 1000

## 2015-09-21 NOTE — Assessment & Plan Note (Signed)
Proceed with FOLFIRI chemotherapy today cycle #9 today. CBC shows absolute neutrophil count of 1.3; hemoglobin 8.1 platelets 80. Chemistries- sodium 132 creatinine 1.57/improved from baseline around 2.

## 2015-09-21 NOTE — Progress Notes (Signed)
.[place] NOTE  Patient Care Team: Adin Hector, MD as PCP - General (Internal Medicine) Clent Jacks, RN as Registered Nurse  Oncology History   # OCT 2016- SQUAMOUS CELL CA of middle esophagus; Stage IV [EGD bx]; PET- Esopahgeal uptake & 2 CM Left Cervical LN-MET SQUAM; Carbo-taxol [Nov 9th]with RT [Nov 14th];  Cont Weekly Carbo- RT; JAN 3rd- 2017-PET stable esophageal uptake/slight increase neck adenopathy- s/p local radiation to the neck with carboplatin-   # FEB 20th- START FOLFIRI s/p 5 cycles-May 2017- PET- Stable-esophageal uptake mediastinal LN/ Improved Left neck LN; cont FOLFIRI.  # TAXOL HELD sec to ACUTE INFUSION REACTIONS  # malnutrition s/p PEG tube  # CKD [stage III]; Hx of non-invasive bladder ca [s/p cystectomy with neo-bladder; [facility]]     Squamous cell esophageal cancer ([place])   05/14/2015 Initial Diagnosis Squamous cell esophageal cancer ([facility])     HISTORY OF PRESENTING ILLNESS:  Jeffrey [name] 65 y.o. 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 Currently status post cycle #8  approximately 2 weeks ago.  Patient forgot to get his PET scan done as ordered at last visit.  However he has gained about 4 pounds since her last visit. He has been using 6 cans of tube feeds every day. He is able to swallow water. Has not tried solids.    He has 1-2 loose stools takes Imodium as needed.  No worsening of the neck lymphadenopathy. Continues to have pain at the PEG tube site needing to use oxycodone as needed.   ROS: A complete 10 point review of system is done which is negative except mentioned above in history of present illness  MEDICAL HISTORY:  Past Medical History  Diagnosis Date  . Hypertension   . Bladder cancer [facility])     s/p surgery and reconstruction  . Emphysema of lung ([place])   . Esophageal  cancer ([place])   . Chronic kidney disease     stage 3    SURGICAL HISTORY: Past Surgical History  Procedure Laterality Date  . Bladder removal    . Esophagogastroduodenoscopy (egd) with propofol N/A 01/21/2015    Procedure: ESOPHAGOGASTRODUODENOSCOPY (EGD) WITH PROPOFOL-looking in the esophagus stomach and upper small intestine to evaluate and treat;  Surgeon: [name], MD;  Location: [facility] ENDOSCOPY;  Service: Endoscopy;  Laterality: N/A;  . Gastrostomy w/ feeding tube Left     placed end of october 2016  . Portacath placement Right 02/04/2015    Procedure: INSERTION PORT-A-CATH;  Surgeon: [name], MD;  Location: [facility] ORS;  Service: General;  Laterality: Right;  . Lymph node biopsy N/A 02/04/2015    Procedure: LYMPH NODE BIOPSY;  Surgeon: [name], MD;  Location: [facility] ORS;  Service: General;  Laterality: N/A;  . Esophagogastroduodenoscopy N/A 05/22/2015    Procedure: ESOPHAGOGASTRODUODENOSCOPY (EGD);  Surgeon: Manya Silvas, MD;  Location: [facility] ENDOSCOPY;  Service: Endoscopy;  Laterality: N/A;    SOCIAL HISTORY: Social History   Social History  . Marital Status: Married    Spouse Name: N/A  . Number of Children: N/A  . Years of Education: N/A   Occupational History  . Not on file.   Social History Main Topics  . Smoking status: Current Every Day Smoker -- 0.50 packs/day    Types: Cigarettes  . Smokeless tobacco: Current User    Types: Chew  . Alcohol Use: No  Comment: quit drinking 3 months ago  . Drug Use: No  . Sexual Activity: Not on file   Other Topics Concern  . Not on file   Social History Narrative   Lives at home with wife. Independent at baseline.    FAMILY HISTORY: Family History  Problem Relation Age of Onset  . CAD Father   . Breast cancer Mother     ALLERGIES:  is allergic to aspirin and taxol.  MEDICATIONS:  Current Outpatient Prescriptions  Medication Sig Dispense Refill  . amLODipine (NORVASC) 10 MG tablet Place 1 tablet  (10 mg total) into feeding tube daily. 30 tablet 0  . diphenoxylate-atropine (LOMOTIL) 2.5-0.025 MG tablet Take 1 tablet by mouth 4 (four) times daily as needed for diarrhea or loose stools. Also take 1 pills 30-60 minutes prior to starting chemotherapy/week cycle. 40 tablet 3  . lidocaine-prilocaine (EMLA) cream Apply cream 1 hour before chemotherapy treatment, place a small amount of saran wrap over the cream to protect your clothing 30 g 1  . neomycin-bacitracin-polymyxin (NEOSPORIN) OINT Apply 1 application topically daily. 30 g 1  . Nutritional Supplements (FEEDING SUPPLEMENT, JEVITY 1.5 CAL/FIBER,) LIQD Place 237 mLs into feeding tube 6 (six) times daily. 1000 mL 5  . ondansetron (ZOFRAN) 8 MG tablet Take 1 tablet (8 mg total) by mouth every 8 (eight) hours as needed for nausea or vomiting (start 3 days; after chemo). 40 tablet 3  . oxyCODONE (ROXICODONE) 5 MG/5ML solution Place 5 mLs (5 mg total) into feeding tube every 8 (eight) hours as needed for severe pain. 100 mL 0  . prochlorperazine (COMPAZINE) 10 MG tablet Take 1 tablet (10 mg total) by mouth every 6 (six) hours as needed for nausea or vomiting. 40 tablet 0   No current facility-administered medications for this visit.   Facility-Administered Medications Ordered in Other Visits  Medication Dose Route Frequency Provider Last Rate Last Dose  . fluorouracil (ADRUCIL) 4,150 mg in sodium chloride 0.9 % 67 mL chemo infusion  2,400 mg/m2 (Treatment Plan Actual) Intravenous 1 day or 1 dose Cammie Sickle, MD   4,150 mg at 09/21/15 1328  . heparin lock flush 100 unit/mL  500 Units Intravenous Once Cammie Sickle, MD   500 Units at 09/21/15 1316  . sodium chloride 0.9 % injection 10 mL  10 mL Intracatheter PRN Cammie Sickle, MD   10 mL at 03/04/15 0933      .  PHYSICAL EXAMINATION: ECOG PERFORMANCE STATUS: 1 - Symptomatic but completely ambulatory  Filed Vitals:   09/21/15 1002  BP: 102/68  Pulse: 93  Temp: 97.8  F (36.6 C)  Resp: 18   Filed Weights   09/21/15 1002  Weight: 120 lb 9.5 oz (54.7 kg)    GENERAL: Thin built; moderately nourished. Alert, no distress and comfortable.he is alone. EYES: no pallor or icterus OROPHARYNX: no thrush or ulceration; good dentition  NECK: supple, no masses felt LYMPH:  ~1-2cm  CM palpable lymphadenopathy in the cervical on left side [improved].  LUNGS: clear to auscultation and  No wheeze or crackles HEART/CVS: regular rate & rhythm and no murmurs; No lower extremity edema ABDOMEN: abdomen soft, non-tender and normal bowel sounds; PEG tube in place Musculoskeletal:no cyanosis of digits and no clubbing  PSYCH: alert & oriented x 3 with fluent speech NEURO: no focal motor/sensory deficits SKIN:  no rashes or significant lesions  LABORATORY DATA:  I have reviewed the data as listed Lab Results  Component Value Date  WBC 2.9* 09/21/2015   HGB 8.1* 09/21/2015   HCT 24.1* 09/21/2015   MCV 98.0 09/21/2015   PLT 80* 09/21/2015    Recent Labs  08/24/15 1006 09/07/15 0948 09/21/15 0952  NA 129* 127* 132*  K 3.8 3.5 3.6  CL 91* 90* 94*  CO2 '30 29 28  ' GLUCOSE 146* 145* 155*  BUN 44* 41* 42*  CREATININE 2.09* 1.90* 1.56*  CALCIUM 8.8* 8.5* 8.6*  GFRNONAA 32* 36* 46*  GFRAA 37* 42* 53*  PROT 7.6 6.9 7.1  ALBUMIN 4.1 3.7 3.5  AST '22 21 25  ' ALT 10* 9* 10*  ALKPHOS 64 54 58  BILITOT 0.7 0.6 0.5     ASSESSMENT & PLAN:   Squamous cell esophageal cancer ([facility]) # 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 April 26th 2017- PET scan shows stable esophageal mass/mediastinal adenopathy.   #Currently status post 8 treatments; restaging PET scan was supposed to be done patient forgot. We'll plan to get it next week. If patient has progression- recommend switching to Abraxane.    Encounter for antineoplastic chemotherapy Proceed with FOLFIRI chemotherapy today cycle #9 today. CBC shows absolute neutrophil count of 1.3; hemoglobin 8.1 platelets 80. Chemistries- sodium 132 creatinine 1.57/improved from baseline around 2.  Anemia due to chemotherapy Hemoglobin 8.1 multifactorial-chronic kidney disease chemotherapy/question iron deficiency anemia. Check iron studies/ferritin. Plan hold chemotherapy in 2 weeks in case patient needs transfusion.   Bladder cancer ([place]) Noninvasive bladder cancer status post cystectomy/neobladder.   Weight loss Improved; gained 4 pounds. Continue PEG tube/met nutrition.  Diarrhea Likely secondary to chemotherapy grade 1. Continue Imodium.   Status post insertion of percutaneous endoscopic gastrostomy (PEG) tube (HCC) No signs of infection; intermittent pain at the site of insertion on oxycodone liquid as needed.     #  Patient follow-up with me in 2 weeks for cycle #10/ CBC CMP. PET prior.      Cammie Sickle, MD 09/21/2015 1:38 PM

## 2015-09-21 NOTE — Assessment & Plan Note (Signed)
Likely secondary to chemotherapy grade 1. Continue Imodium.

## 2015-09-21 NOTE — Assessment & Plan Note (Signed)
Improved; gained 4 pounds. Continue PEG tube/met nutrition.

## 2015-09-21 NOTE — Assessment & Plan Note (Addendum)
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 April 26th 2017- PET scan shows stable esophageal mass/mediastinal adenopathy.   #Currently status post 8 treatments; restaging PET scan was supposed to be done patient forgot. We'll plan to get it next week. If patient has progression- recommend switching to Abraxane.

## 2015-09-21 NOTE — Assessment & Plan Note (Signed)
Noninvasive bladder cancer status post cystectomy/neobladder.

## 2015-09-21 NOTE — Progress Notes (Signed)
Patient states he is fatigued.  Also states he is up to 6 cans of Jevity per day. Patient has had 4# weight gain since last appointment.

## 2015-09-21 NOTE — Assessment & Plan Note (Signed)
No signs of infection; intermittent pain at the site of insertion on oxycodone liquid as needed.

## 2015-09-21 NOTE — Assessment & Plan Note (Signed)
Hemoglobin 8.1 multifactorial-chronic kidney disease chemotherapy/question iron deficiency anemia. Check iron studies/ferritin. Plan hold chemotherapy in 2 weeks in case patient needs transfusion.

## 2015-09-23 ENCOUNTER — Inpatient Hospital Stay: Payer: Self-pay

## 2015-09-23 DIAGNOSIS — C154 Malignant neoplasm of middle third of esophagus: Secondary | ICD-10-CM

## 2015-09-23 MED ORDER — HEPARIN SOD (PORK) LOCK FLUSH 100 UNIT/ML IV SOLN
500.0000 [IU] | Freq: Once | INTRAVENOUS | Status: AC | PRN
  Administered 2015-09-23: 500 [IU]

## 2015-09-23 MED ORDER — SODIUM CHLORIDE 0.9% FLUSH
3.0000 mL | INTRAVENOUS | Status: DC | PRN
  Filled 2015-09-23: qty 3

## 2015-09-23 MED ORDER — HEPARIN SOD (PORK) LOCK FLUSH 100 UNIT/ML IV SOLN
INTRAVENOUS | Status: AC
  Filled 2015-09-23: qty 5

## 2015-09-24 ENCOUNTER — Telehealth: Payer: Self-pay | Admitting: *Deleted

## 2015-09-24 NOTE — Telephone Encounter (Signed)
Attempted to contact patient on behalf of dietary. Voice mail msg came on; however, RN unable to leave vm.

## 2015-09-24 NOTE — Telephone Encounter (Signed)
-----   Message from Sharlyn Bologna sent at 09/24/2015  8:45 AM EDT ----- Regarding: not able to reach pt Hey. Joli wants to do a follow up with Mr. Boat. When I have tried calling him, no one answers and his mailbox is full so I cannot leave a message. When you see him at the next appointment. Ask him to call our office or see if he can meet with her on July 11  or July 12th between 9am and 3pm and let me know. Thank you for your help.   Severiano Gilbert [phone]

## 2015-09-25 ENCOUNTER — Other Ambulatory Visit: Payer: Self-pay | Admitting: Internal Medicine

## 2015-09-25 ENCOUNTER — Encounter
Admission: RE | Admit: 2015-09-25 | Discharge: 2015-09-25 | Disposition: A | Discharge status: Home / Self Care | Payer: Self-pay | Source: Ambulatory Visit | Attending: Internal Medicine | Admitting: Internal Medicine

## 2015-09-25 DIAGNOSIS — R634 Abnormal weight loss: Secondary | ICD-10-CM | POA: Insufficient documentation

## 2015-09-25 DIAGNOSIS — T451X5A Adverse effect of antineoplastic and immunosuppressive drugs, initial encounter: Secondary | ICD-10-CM | POA: Insufficient documentation

## 2015-09-25 DIAGNOSIS — C159 Malignant neoplasm of esophagus, unspecified: Secondary | ICD-10-CM | POA: Insufficient documentation

## 2015-09-25 DIAGNOSIS — E46 Unspecified protein-calorie malnutrition: Secondary | ICD-10-CM | POA: Insufficient documentation

## 2015-09-25 DIAGNOSIS — T451X5S Adverse effect of antineoplastic and immunosuppressive drugs, sequela: Secondary | ICD-10-CM

## 2015-09-25 LAB — GLUCOSE, CAPILLARY: GLUCOSE-CAPILLARY: 103 mg/dL — AB (ref 65–99)

## 2015-09-25 MED ORDER — FLUDEOXYGLUCOSE F - 18 (FDG) INJECTION
12.0700 | Freq: Once | INTRAVENOUS | Status: AC | PRN
  Administered 2015-09-25: 12.07 via INTRAVENOUS

## 2015-10-01 NOTE — Progress Notes (Signed)
Openned in error

## 2015-10-01 NOTE — Progress Notes (Signed)
Chart openned in error.

## 2015-10-05 ENCOUNTER — Inpatient Hospital Stay: Payer: Self-pay | Attending: Internal Medicine

## 2015-10-05 ENCOUNTER — Inpatient Hospital Stay (HOSPITAL_BASED_OUTPATIENT_CLINIC_OR_DEPARTMENT_OTHER): Payer: Self-pay | Admitting: Internal Medicine

## 2015-10-05 ENCOUNTER — Inpatient Hospital Stay: Payer: Self-pay

## 2015-10-05 VITALS — BP 124/73 | HR 109 | Temp 98.2°F | Resp 18 | Wt 118.2 lb

## 2015-10-05 VITALS — BP 106/69 | HR 97

## 2015-10-05 DIAGNOSIS — J439 Emphysema, unspecified: Secondary | ICD-10-CM | POA: Insufficient documentation

## 2015-10-05 DIAGNOSIS — E46 Unspecified protein-calorie malnutrition: Secondary | ICD-10-CM | POA: Insufficient documentation

## 2015-10-05 DIAGNOSIS — Z803 Family history of malignant neoplasm of breast: Secondary | ICD-10-CM

## 2015-10-05 DIAGNOSIS — Z8551 Personal history of malignant neoplasm of bladder: Secondary | ICD-10-CM

## 2015-10-05 DIAGNOSIS — G4700 Insomnia, unspecified: Secondary | ICD-10-CM | POA: Insufficient documentation

## 2015-10-05 DIAGNOSIS — C154 Malignant neoplasm of middle third of esophagus: Secondary | ICD-10-CM

## 2015-10-05 DIAGNOSIS — G8929 Other chronic pain: Secondary | ICD-10-CM | POA: Insufficient documentation

## 2015-10-05 DIAGNOSIS — C159 Malignant neoplasm of esophagus, unspecified: Secondary | ICD-10-CM

## 2015-10-05 DIAGNOSIS — Z79899 Other long term (current) drug therapy: Secondary | ICD-10-CM | POA: Insufficient documentation

## 2015-10-05 DIAGNOSIS — R634 Abnormal weight loss: Secondary | ICD-10-CM

## 2015-10-05 DIAGNOSIS — F17210 Nicotine dependence, cigarettes, uncomplicated: Secondary | ICD-10-CM | POA: Insufficient documentation

## 2015-10-05 DIAGNOSIS — C678 Malignant neoplasm of overlapping sites of bladder: Secondary | ICD-10-CM

## 2015-10-05 DIAGNOSIS — Z5111 Encounter for antineoplastic chemotherapy: Secondary | ICD-10-CM | POA: Insufficient documentation

## 2015-10-05 DIAGNOSIS — T451X5A Adverse effect of antineoplastic and immunosuppressive drugs, initial encounter: Secondary | ICD-10-CM

## 2015-10-05 DIAGNOSIS — I129 Hypertensive chronic kidney disease with stage 1 through stage 4 chronic kidney disease, or unspecified chronic kidney disease: Secondary | ICD-10-CM | POA: Insufficient documentation

## 2015-10-05 DIAGNOSIS — D508 Other iron deficiency anemias: Secondary | ICD-10-CM

## 2015-10-05 DIAGNOSIS — N183 Chronic kidney disease, stage 3 (moderate): Secondary | ICD-10-CM

## 2015-10-05 DIAGNOSIS — D509 Iron deficiency anemia, unspecified: Secondary | ICD-10-CM

## 2015-10-05 DIAGNOSIS — R197 Diarrhea, unspecified: Secondary | ICD-10-CM | POA: Insufficient documentation

## 2015-10-05 DIAGNOSIS — D6481 Anemia due to antineoplastic chemotherapy: Secondary | ICD-10-CM

## 2015-10-05 DIAGNOSIS — R590 Localized enlarged lymph nodes: Secondary | ICD-10-CM

## 2015-10-05 DIAGNOSIS — Z931 Gastrostomy status: Secondary | ICD-10-CM | POA: Insufficient documentation

## 2015-10-05 LAB — CBC WITH DIFFERENTIAL/PLATELET
Basophils Absolute: 0 10*3/uL (ref 0–0.1)
Eosinophils Absolute: 0 10*3/uL (ref 0–0.7)
Eosinophils Relative: 0 %
HEMATOCRIT: 23.8 % — AB (ref 40.0–52.0)
Hemoglobin: 8 g/dL — ABNORMAL LOW (ref 13.0–18.0)
LYMPHS ABS: 0.6 10*3/uL — AB (ref 1.0–3.6)
Lymphocytes Relative: 24 %
MCH: 32.6 pg (ref 26.0–34.0)
MCHC: 33.7 g/dL (ref 32.0–36.0)
MCV: 96.9 fL (ref 80.0–100.0)
MONO ABS: 0.5 10*3/uL (ref 0.2–1.0)
NEUTROS ABS: 1.4 10*3/uL (ref 1.4–6.5)
Neutrophils Relative %: 55 %
Platelets: 85 10*3/uL — ABNORMAL LOW (ref 150–440)
RBC: 2.45 MIL/uL — ABNORMAL LOW (ref 4.40–5.90)
RDW: 20.8 % — AB (ref 11.5–14.5)
WBC: 2.5 10*3/uL — ABNORMAL LOW (ref 3.8–10.6)

## 2015-10-05 LAB — COMPREHENSIVE METABOLIC PANEL
ALBUMIN: 3.5 g/dL (ref 3.5–5.0)
ALT: 9 U/L — ABNORMAL LOW (ref 17–63)
ANION GAP: 10 (ref 5–15)
AST: 21 U/L (ref 15–41)
Alkaline Phosphatase: 47 U/L (ref 38–126)
BILIRUBIN TOTAL: 0.5 mg/dL (ref 0.3–1.2)
BUN: 37 mg/dL — ABNORMAL HIGH (ref 6–20)
CALCIUM: 8.8 mg/dL — AB (ref 8.9–10.3)
CO2: 26 mmol/L (ref 22–32)
Chloride: 97 mmol/L — ABNORMAL LOW (ref 101–111)
Creatinine, Ser: 1.76 mg/dL — ABNORMAL HIGH (ref 0.61–1.24)
GFR, EST AFRICAN AMERICAN: 46 mL/min — AB (ref >60)
GFR, EST NON AFRICAN AMERICAN: 39 mL/min — AB (ref >60)
GLUCOSE: 175 mg/dL — AB (ref 65–99)
POTASSIUM: 4 mmol/L (ref 3.5–5.1)
Sodium: 133 mmol/L — ABNORMAL LOW (ref 135–145)
TOTAL PROTEIN: 7 g/dL (ref 6.5–8.1)

## 2015-10-05 LAB — SAMPLE TO BLOOD BANK

## 2015-10-05 MED ORDER — HEPARIN SOD (PORK) LOCK FLUSH 100 UNIT/ML IV SOLN
500.0000 [IU] | Freq: Once | INTRAVENOUS | Status: AC
  Administered 2015-10-05: 500 [IU] via INTRAVENOUS

## 2015-10-05 MED ORDER — IRON SUCROSE 20 MG/ML IV SOLN
200.0000 mg | Freq: Once | INTRAVENOUS | Status: AC
  Administered 2015-10-05: 200 mg via INTRAVENOUS
  Filled 2015-10-05: qty 10

## 2015-10-05 MED ORDER — OXYCODONE HCL 5 MG/5ML PO SOLN: 5.0000 mg | Freq: Three times a day (TID) | ORAL | Status: DC | PRN

## 2015-10-05 MED ORDER — SODIUM CHLORIDE 0.9% FLUSH
10.0000 mL | Freq: Once | INTRAVENOUS | Status: AC
  Administered 2015-10-05: 10 mL via INTRAVENOUS
  Filled 2015-10-05: qty 10

## 2015-10-05 MED ORDER — SODIUM CHLORIDE 0.9 % IV SOLN
Freq: Once | INTRAVENOUS | Status: AC
  Administered 2015-10-05: 11:00:00 via INTRAVENOUS
  Filled 2015-10-05: qty 1000

## 2015-10-05 MED ORDER — HEPARIN SOD (PORK) LOCK FLUSH 100 UNIT/ML IV SOLN: 500.0000 [IU] | Freq: Once | INTRAVENOUS | Status: DC | PRN

## 2015-10-05 MED ORDER — PROCHLORPERAZINE MALEATE 10 MG PO TABS: 10.0000 mg | ORAL_TABLET | Freq: Four times a day (QID) | ORAL | Status: DC | PRN

## 2015-10-05 NOTE — Assessment & Plan Note (Addendum)
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   #  Recommend Start Abraxane weekly day 1 and 8 every 21 days. The patient had acute Taxol reaction- and switched over to abraxane.   # Diarrhea- Likely secondary to chemotherapy grade 1. Continue Imodium.   # Hemoglobin 8.1 multifactorial-chronic kidney disease chemotherapy/IDA; iron saturations and low- recommend IV venofer q wx4.   # pain - continue oxycodone liquid.   # start abraxane next week/ plan to see me in 2 weeks/ day #8/cycle-1; cbc/cmp.   I reviewed the images myself and with the patient and family in detail. A copy of this report was given.

## 2015-10-05 NOTE — Progress Notes (Signed)
.[place] NOTE  Patient Care Team: Adin Hector, MD as PCP - General (Internal Medicine) Clent Jacks, RN as Registered Nurse  Oncology History   # OCT 2016- SQUAMOUS CELL CA of middle esophagus; Stage IV [EGD bx]; PET- Esopahgeal uptake & 2 CM Left Cervical LN-MET SQUAM; Carbo-taxol [Nov 9th]with RT [Nov 14th];  Cont Weekly Carbo- RT; JAN 3rd- 2017-PET stable esophageal uptake/slight increase neck adenopathy- s/p local radiation to the neck with carboplatin-   # FEB 20th- START FOLFIRI s/p 5 cycles-May 2017- PET- Stable-esophageal uptake mediastinal LN/ Improved Left neck LN; cont FOLFIRI.  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 Currently status post cycle #9  approximately 2 weeks ago.He is  here to review the results of his PET scan  Patient noted to have mild swelling of his left neck lymph node. Denies any worsening pain- except for chronic pain site of the PEG tube; he also noted mild pain this intrascapular region.  He continues to use his PEG tube ; weight stable.  He has been using 6 cans of tube feeds every day. He is able to swallow water. Has not tried solids.   He has 1-2 loose stools takes Imodium as needed.    ROS: A complete 10 point review of system is done which is negative except mentioned above in history of present illness  MEDICAL HISTORY:  Past Medical History  Diagnosis Date  . Hypertension   . Bladder cancer [facility])     s/p surgery and reconstruction  .  Emphysema of lung ([place])   . Esophageal cancer ([place])   . Chronic kidney disease     stage 3    SURGICAL HISTORY: Past Surgical History  Procedure Laterality Date  . Bladder removal    . Esophagogastroduodenoscopy (egd) with propofol N/A 01/21/2015    Procedure: ESOPHAGOGASTRODUODENOSCOPY (EGD) WITH PROPOFOL-looking in the esophagus stomach and upper small intestine to evaluate and treat;  Surgeon: [name], MD;  Location: [facility] ENDOSCOPY;  Service: Endoscopy;  Laterality: N/A;  . Gastrostomy w/ feeding tube Left     placed end of october 2016  . Portacath placement Right 02/04/2015    Procedure: INSERTION PORT-A-CATH;  Surgeon: [name], MD;  Location: [facility] ORS;  Service: General;  Laterality: Right;  . Lymph node biopsy N/A 02/04/2015    Procedure: LYMPH NODE BIOPSY;  Surgeon: [name], MD;  Location: [facility] ORS;  Service: General;  Laterality: N/A;  . Esophagogastroduodenoscopy N/A 05/22/2015    Procedure: ESOPHAGOGASTRODUODENOSCOPY (EGD);  Surgeon: Manya Silvas, MD;  Location: [facility] ENDOSCOPY;  Service: Endoscopy;  Laterality: N/A;    SOCIAL HISTORY: Social History   Social History  . Marital Status: Married    Spouse Name: N/A  . Number of Children: N/A  . Years of Education: N/A   Occupational History  . Not on file.   Social History Main Topics  . Smoking status: Current Every Day Smoker -- 0.50 packs/day    Types: Cigarettes  . Smokeless tobacco: Current User  Types: Chew  . Alcohol Use: No     Comment: quit drinking 3 months ago  . Drug Use: No  . Sexual Activity: Not on file   Other Topics Concern  . Not on file   Social History Narrative   Lives at home with wife. Independent at baseline.    FAMILY HISTORY: Family History  Problem Relation Age of Onset  . CAD Father   . Breast cancer Mother     ALLERGIES:  is allergic to aspirin and taxol.  MEDICATIONS:  Current Outpatient Prescriptions  Medication Sig Dispense Refill  .  amLODipine (NORVASC) 10 MG tablet Place 1 tablet (10 mg total) into feeding tube daily. 30 tablet 0  . diphenoxylate-atropine (LOMOTIL) 2.5-0.025 MG tablet Take 1 tablet by mouth 4 (four) times daily as needed for diarrhea or loose stools. Also take 1 pills 30-60 minutes prior to starting chemotherapy/week cycle. 40 tablet 3  . lidocaine-prilocaine (EMLA) cream Apply cream 1 hour before chemotherapy treatment, place a small amount of saran wrap over the cream to protect your clothing 30 g 1  . neomycin-bacitracin-polymyxin (NEOSPORIN) OINT Apply 1 application topically daily. 30 g 1  . Nutritional Supplements (FEEDING SUPPLEMENT, JEVITY 1.5 CAL/FIBER,) LIQD Place 237 mLs into feeding tube 6 (six) times daily. 1000 mL 5  . ondansetron (ZOFRAN) 8 MG tablet Take 1 tablet (8 mg total) by mouth every 8 (eight) hours as needed for nausea or vomiting (start 3 days; after chemo). 40 tablet 3  . oxyCODONE (ROXICODONE) 5 MG/5ML solution Place 5 mLs (5 mg total) into feeding tube every 8 (eight) hours as needed for severe pain. 100 mL 0  . prochlorperazine (COMPAZINE) 10 MG tablet Take 1 tablet (10 mg total) by mouth every 6 (six) hours as needed for nausea or vomiting. 40 tablet 3   No current facility-administered medications for this visit.   Facility-Administered Medications Ordered in Other Visits  Medication Dose Route Frequency Provider Last Rate Last Dose  . sodium chloride 0.9 % injection 10 mL  10 mL Intracatheter PRN Cammie Sickle, MD   10 mL at 03/04/15 0933      .  PHYSICAL EXAMINATION: ECOG PERFORMANCE STATUS: 1 - Symptomatic but completely ambulatory  Filed Vitals:   10/05/15 0943  BP: 124/73  Pulse: 109  Temp: 98.2 F (36.8 C)  Resp: 18   Filed Weights   10/05/15 0943  Weight: 118 lb 2.7 oz (53.6 kg)    GENERAL: Thin built; moderately nourished. Alert, no distress and comfortable.he is Accompanied by his wife EYES: no pallor or icterus OROPHARYNX: no thrush or  ulceration; good dentition  NECK: supple, no masses felt LYMPH:  2cm  CM palpable lymphadenopathy in the cervical on left side  LUNGS: clear to auscultation and  No wheeze or crackles HEART/CVS: regular rate & rhythm and no murmurs; No lower extremity edema ABDOMEN: abdomen soft, non-tender and normal bowel sounds; PEG tube in place Musculoskeletal:no cyanosis of digits and no clubbing  PSYCH: alert & oriented x 3 with fluent speech NEURO: no focal motor/sensory deficits SKIN:  no rashes or significant lesions  LABORATORY DATA:  I have reviewed the data as listed Lab Results  Component Value Date   WBC 2.5* 10/05/2015   HGB 8.0* 10/05/2015   HCT 23.8* 10/05/2015   MCV 96.9 10/05/2015   PLT 85* 10/05/2015    Recent Labs  09/07/15 0948 09/21/15 0952 10/05/15 0919  NA 127* 132* 133*  K 3.5 3.6 4.0  CL  90* 94* 97*  CO2 _0 GLUCOSE 145* 155* 175*  BUN 41* 42* 37*  CREATININE 1.90* 1.56* 1.76*  CALCIUM 8.5* 8.6* 8.8*  GFRNONAA 36* 46* 39*  GFRAA 42* 53* 46*  PROT 6.9 7.1 7.0  ALBUMIN 3.7 3.5 3.5  AST _1 ALT 9* 10* 9*  ALKPHOS 54 58 47  BILITOT 0.6 0.5 0.5     ASSESSMENT & PLAN:   Squamous cell esophageal cancer (HCC) # 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   #  Recommend Start Abraxane weekly day 1 and 8 every 21 days. The patient had acute Taxol reaction- and switched over to abraxane.   # Diarrhea- Likely secondary to chemotherapy grade 1. Continue Imodium.   # Hemoglobin 8.1 multifactorial-chronic kidney disease chemotherapy/IDA; iron saturations and low- recommend IV venofer q wx4.   # pain - continue oxycodone liquid.   # start abraxane next week/ plan to see me in 2 weeks/ day #8/cycle-1; cbc/cmp.   I reviewed the images myself and with the patient and family in detail. A copy of this report was given.         Cammie Sickle, MD 10/05/2015  6:12 PM

## 2015-10-05 NOTE — Progress Notes (Signed)
rf request provided by md for compazine and oxycodone today.

## 2015-10-07 ENCOUNTER — Inpatient Hospital Stay: Payer: Self-pay

## 2015-10-09 ENCOUNTER — Other Ambulatory Visit: Payer: Self-pay | Admitting: Internal Medicine

## 2015-10-12 ENCOUNTER — Other Ambulatory Visit: Payer: Self-pay | Admitting: Internal Medicine

## 2015-10-12 ENCOUNTER — Inpatient Hospital Stay: Payer: Self-pay

## 2015-10-12 ENCOUNTER — Other Ambulatory Visit: Payer: Self-pay | Admitting: *Deleted

## 2015-10-12 VITALS — BP 100/60 | HR 100 | Temp 97.8°F | Resp 18

## 2015-10-12 DIAGNOSIS — D631 Anemia in chronic kidney disease: Secondary | ICD-10-CM

## 2015-10-12 DIAGNOSIS — D6481 Anemia due to antineoplastic chemotherapy: Secondary | ICD-10-CM

## 2015-10-12 DIAGNOSIS — T451X5A Adverse effect of antineoplastic and immunosuppressive drugs, initial encounter: Secondary | ICD-10-CM

## 2015-10-12 DIAGNOSIS — C154 Malignant neoplasm of middle third of esophagus: Secondary | ICD-10-CM

## 2015-10-12 DIAGNOSIS — D649 Anemia, unspecified: Secondary | ICD-10-CM

## 2015-10-12 DIAGNOSIS — C159 Malignant neoplasm of esophagus, unspecified: Secondary | ICD-10-CM

## 2015-10-12 DIAGNOSIS — N189 Chronic kidney disease, unspecified: Principal | ICD-10-CM

## 2015-10-12 LAB — CBC WITH DIFFERENTIAL/PLATELET
BASOS PCT: 0 %
Basophils Absolute: 0 10*3/uL (ref 0–0.1)
EOS PCT: 0 %
Eosinophils Absolute: 0 10*3/uL (ref 0–0.7)
HEMATOCRIT: 22.8 % — AB (ref 40.0–52.0)
Hemoglobin: 7.7 g/dL — ABNORMAL LOW (ref 13.0–18.0)
Lymphocytes Relative: 5 %
Lymphs Abs: 0.5 10*3/uL — ABNORMAL LOW (ref 1.0–3.6)
MCH: 32.2 pg (ref 26.0–34.0)
MCHC: 33.7 g/dL (ref 32.0–36.0)
MCV: 95.3 fL (ref 80.0–100.0)
MONO ABS: 1.2 10*3/uL — AB (ref 0.2–1.0)
MONOS PCT: 10 %
NEUTROS ABS: 9.7 10*3/uL — AB (ref 1.4–6.5)
Neutrophils Relative %: 85 %
PLATELETS: 184 10*3/uL (ref 150–440)
RBC: 2.39 MIL/uL — ABNORMAL LOW (ref 4.40–5.90)
RDW: 20.4 % — AB (ref 11.5–14.5)
WBC: 11.4 10*3/uL — ABNORMAL HIGH (ref 3.8–10.6)

## 2015-10-12 LAB — COMPREHENSIVE METABOLIC PANEL
ALBUMIN: 2.9 g/dL — AB (ref 3.5–5.0)
ALT: 21 U/L (ref 17–63)
AST: 19 U/L (ref 15–41)
Alkaline Phosphatase: 96 U/L (ref 38–126)
Anion gap: 10 (ref 5–15)
BUN: 38 mg/dL — AB (ref 6–20)
CHLORIDE: 93 mmol/L — AB (ref 101–111)
CO2: 26 mmol/L (ref 22–32)
CREATININE: 1.7 mg/dL — AB (ref 0.61–1.24)
Calcium: 8.4 mg/dL — ABNORMAL LOW (ref 8.9–10.3)
GFR calc Af Amer: 48 mL/min — ABNORMAL LOW (ref >60)
GFR, EST NON AFRICAN AMERICAN: 41 mL/min — AB (ref >60)
GLUCOSE: 199 mg/dL — AB (ref 65–99)
Potassium: 3.9 mmol/L (ref 3.5–5.1)
SODIUM: 129 mmol/L — AB (ref 135–145)
Total Bilirubin: 0.3 mg/dL (ref 0.3–1.2)
Total Protein: 7.1 g/dL (ref 6.5–8.1)

## 2015-10-12 LAB — SAMPLE TO BLOOD BANK

## 2015-10-12 LAB — PREPARE RBC (CROSSMATCH)

## 2015-10-12 LAB — ABO/RH: ABO/RH(D): A POS

## 2015-10-12 MED ORDER — PROCHLORPERAZINE EDISYLATE 5 MG/ML IJ SOLN
10.0000 mg | Freq: Once | INTRAMUSCULAR | Status: AC
  Administered 2015-10-12: 10 mg via INTRAVENOUS
  Filled 2015-10-12: qty 2

## 2015-10-12 MED ORDER — SODIUM CHLORIDE 0.9% FLUSH
10.0000 mL | Freq: Once | INTRAVENOUS | Status: AC
  Administered 2015-10-12: 10 mL via INTRAVENOUS
  Filled 2015-10-12: qty 10

## 2015-10-12 MED ORDER — PROCHLORPERAZINE MALEATE 10 MG PO TABS
10.0000 mg | ORAL_TABLET | Freq: Once | ORAL | Status: DC
  Filled 2015-10-12: qty 1

## 2015-10-12 MED ORDER — PACLITAXEL PROTEIN-BOUND CHEMO INJECTION 100 MG
100.0000 mg/m2 | Freq: Once | INTRAVENOUS | Status: AC
  Administered 2015-10-12: 150 mg via INTRAVENOUS
  Filled 2015-10-12: qty 30

## 2015-10-12 MED ORDER — HEPARIN SOD (PORK) LOCK FLUSH 100 UNIT/ML IV SOLN
500.0000 [IU] | Freq: Once | INTRAVENOUS | Status: AC
  Administered 2015-10-12: 500 [IU] via INTRAVENOUS
  Filled 2015-10-12: qty 5

## 2015-10-12 MED ORDER — SODIUM CHLORIDE 0.9 % IV SOLN
Freq: Once | INTRAVENOUS | Status: AC
  Administered 2015-10-12: 11:00:00 via INTRAVENOUS
  Filled 2015-10-12: qty 1000

## 2015-10-12 NOTE — Progress Notes (Signed)
Blood bank orders released. Penny in blood bank notified. msg sent to cancer ctr scheduling to arrange for 2 units of blood on 10/13/15

## 2015-10-13 ENCOUNTER — Inpatient Hospital Stay: Payer: Self-pay

## 2015-10-13 VITALS — BP 101/74 | HR 81 | Temp 98.0°F | Resp 18

## 2015-10-13 DIAGNOSIS — N189 Chronic kidney disease, unspecified: Principal | ICD-10-CM

## 2015-10-13 DIAGNOSIS — D631 Anemia in chronic kidney disease: Secondary | ICD-10-CM

## 2015-10-13 MED ORDER — SODIUM CHLORIDE 0.9 % IV SOLN
INTRAVENOUS | Status: DC
  Administered 2015-10-13: 10:00:00 via INTRAVENOUS
  Filled 2015-10-13: qty 1000

## 2015-10-13 MED ORDER — DIPHENHYDRAMINE HCL 50 MG/ML IJ SOLN
25.0000 mg | Freq: Once | INTRAMUSCULAR | Status: AC
  Administered 2015-10-13: 25 mg via INTRAVENOUS
  Filled 2015-10-13: qty 1

## 2015-10-13 MED ORDER — SODIUM CHLORIDE 0.9% FLUSH
10.0000 mL | INTRAVENOUS | Status: DC | PRN
  Administered 2015-10-13: 10 mL via INTRAVENOUS
  Filled 2015-10-13: qty 10

## 2015-10-13 MED ORDER — DIPHENHYDRAMINE HCL 25 MG PO CAPS
25.0000 mg | ORAL_CAPSULE | Freq: Once | ORAL | Status: DC
  Filled 2015-10-13: qty 1

## 2015-10-13 MED ORDER — FUROSEMIDE 10 MG/ML IJ SOLN
20.0000 mg | Freq: Once | INTRAMUSCULAR | Status: AC
  Administered 2015-10-13: 20 mg via INTRAVENOUS
  Filled 2015-10-13: qty 2

## 2015-10-13 MED ORDER — ACETAMINOPHEN 160 MG/5ML PO SOLN
650.0000 mg | Freq: Once | ORAL | Status: AC
  Administered 2015-10-13: 650 mg via ORAL
  Filled 2015-10-13: qty 20.3

## 2015-10-13 MED ORDER — ACETAMINOPHEN 325 MG PO TABS
650.0000 mg | ORAL_TABLET | Freq: Once | ORAL | Status: DC
  Filled 2015-10-13: qty 2

## 2015-10-13 MED ORDER — HEPARIN SOD (PORK) LOCK FLUSH 100 UNIT/ML IV SOLN
500.0000 [IU] | Freq: Once | INTRAVENOUS | Status: AC
  Administered 2015-10-13: 500 [IU] via INTRAVENOUS
  Filled 2015-10-13: qty 5

## 2015-10-14 LAB — TYPE AND SCREEN
ABO/RH(D): A POS
ANTIBODY SCREEN: NEGATIVE
Unit division: 0
Unit division: 0

## 2015-10-19 ENCOUNTER — Inpatient Hospital Stay: Payer: Self-pay

## 2015-10-19 ENCOUNTER — Inpatient Hospital Stay (HOSPITAL_BASED_OUTPATIENT_CLINIC_OR_DEPARTMENT_OTHER): Payer: Self-pay | Admitting: Internal Medicine

## 2015-10-19 ENCOUNTER — Other Ambulatory Visit: Payer: Self-pay | Admitting: *Deleted

## 2015-10-19 VITALS — BP 100/68 | HR 99 | Temp 99.4°F | Resp 18 | Wt 118.1 lb

## 2015-10-19 DIAGNOSIS — Z803 Family history of malignant neoplasm of breast: Secondary | ICD-10-CM

## 2015-10-19 DIAGNOSIS — C159 Malignant neoplasm of esophagus, unspecified: Secondary | ICD-10-CM

## 2015-10-19 DIAGNOSIS — T451X5A Adverse effect of antineoplastic and immunosuppressive drugs, initial encounter: Secondary | ICD-10-CM

## 2015-10-19 DIAGNOSIS — Z5111 Encounter for antineoplastic chemotherapy: Secondary | ICD-10-CM

## 2015-10-19 DIAGNOSIS — Z8551 Personal history of malignant neoplasm of bladder: Secondary | ICD-10-CM

## 2015-10-19 DIAGNOSIS — N183 Chronic kidney disease, stage 3 (moderate): Secondary | ICD-10-CM

## 2015-10-19 DIAGNOSIS — Z931 Gastrostomy status: Secondary | ICD-10-CM

## 2015-10-19 DIAGNOSIS — R197 Diarrhea, unspecified: Secondary | ICD-10-CM

## 2015-10-19 DIAGNOSIS — F17210 Nicotine dependence, cigarettes, uncomplicated: Secondary | ICD-10-CM

## 2015-10-19 DIAGNOSIS — C678 Malignant neoplasm of overlapping sites of bladder: Secondary | ICD-10-CM

## 2015-10-19 DIAGNOSIS — R590 Localized enlarged lymph nodes: Secondary | ICD-10-CM

## 2015-10-19 DIAGNOSIS — D6481 Anemia due to antineoplastic chemotherapy: Secondary | ICD-10-CM

## 2015-10-19 DIAGNOSIS — I129 Hypertensive chronic kidney disease with stage 1 through stage 4 chronic kidney disease, or unspecified chronic kidney disease: Secondary | ICD-10-CM

## 2015-10-19 DIAGNOSIS — C154 Malignant neoplasm of middle third of esophagus: Secondary | ICD-10-CM | POA: Insufficient documentation

## 2015-10-19 DIAGNOSIS — Z79899 Other long term (current) drug therapy: Secondary | ICD-10-CM

## 2015-10-19 DIAGNOSIS — J439 Emphysema, unspecified: Secondary | ICD-10-CM

## 2015-10-19 DIAGNOSIS — D509 Iron deficiency anemia, unspecified: Secondary | ICD-10-CM

## 2015-10-19 DIAGNOSIS — E46 Unspecified protein-calorie malnutrition: Secondary | ICD-10-CM

## 2015-10-19 LAB — CBC WITH DIFFERENTIAL/PLATELET
BASOS ABS: 0.1 10*3/uL (ref 0–0.1)
Basophils Relative: 1 %
Eosinophils Absolute: 0 10*3/uL (ref 0–0.7)
Eosinophils Relative: 0 %
HEMATOCRIT: 30.2 % — AB (ref 40.0–52.0)
HEMOGLOBIN: 10.3 g/dL — AB (ref 13.0–18.0)
LYMPHS PCT: 5 %
Lymphs Abs: 0.6 10*3/uL — ABNORMAL LOW (ref 1.0–3.6)
MCH: 32 pg (ref 26.0–34.0)
MCHC: 34.3 g/dL (ref 32.0–36.0)
MCV: 93.3 fL (ref 80.0–100.0)
MONO ABS: 1.3 10*3/uL — AB (ref 0.2–1.0)
Monocytes Relative: 12 %
NEUTROS ABS: 9.3 10*3/uL — AB (ref 1.4–6.5)
NEUTROS PCT: 82 %
Platelets: 217 10*3/uL (ref 150–440)
RBC: 3.23 MIL/uL — ABNORMAL LOW (ref 4.40–5.90)
RDW: 19.1 % — AB (ref 11.5–14.5)
WBC: 11.2 10*3/uL — ABNORMAL HIGH (ref 3.8–10.6)

## 2015-10-19 LAB — COMPREHENSIVE METABOLIC PANEL
ALK PHOS: 67 U/L (ref 38–126)
ALT: 10 U/L — AB (ref 17–63)
AST: 15 U/L (ref 15–41)
Albumin: 3.3 g/dL — ABNORMAL LOW (ref 3.5–5.0)
Anion gap: 9 (ref 5–15)
BILIRUBIN TOTAL: 0.5 mg/dL (ref 0.3–1.2)
BUN: 42 mg/dL — ABNORMAL HIGH (ref 6–20)
CALCIUM: 8.8 mg/dL — AB (ref 8.9–10.3)
CO2: 27 mmol/L (ref 22–32)
Chloride: 96 mmol/L — ABNORMAL LOW (ref 101–111)
Creatinine, Ser: 1.52 mg/dL — ABNORMAL HIGH (ref 0.61–1.24)
GFR calc Af Amer: 55 mL/min — ABNORMAL LOW (ref >60)
GFR, EST NON AFRICAN AMERICAN: 47 mL/min — AB (ref >60)
Glucose, Bld: 110 mg/dL — ABNORMAL HIGH (ref 65–99)
Potassium: 4.5 mmol/L (ref 3.5–5.1)
Sodium: 132 mmol/L — ABNORMAL LOW (ref 135–145)
Total Protein: 8.1 g/dL (ref 6.5–8.1)

## 2015-10-19 LAB — SAMPLE TO BLOOD BANK

## 2015-10-19 MED ORDER — SODIUM CHLORIDE 0.9% FLUSH
10.0000 mL | INTRAVENOUS | Status: DC | PRN
  Administered 2015-10-19: 10 mL
  Filled 2015-10-19: qty 10

## 2015-10-19 MED ORDER — PROCHLORPERAZINE EDISYLATE 5 MG/ML IJ SOLN
10.0000 mg | Freq: Once | INTRAMUSCULAR | Status: AC
Start: 2015-10-19 — End: 2015-10-19
  Administered 2015-10-19: 10 mg via INTRAVENOUS
  Filled 2015-10-19: qty 2

## 2015-10-19 MED ORDER — PROCHLORPERAZINE MALEATE 10 MG PO TABS: 10.0000 mg | ORAL_TABLET | Freq: Once | ORAL | Status: DC

## 2015-10-19 MED ORDER — HEPARIN SOD (PORK) LOCK FLUSH 100 UNIT/ML IV SOLN
500.0000 [IU] | Freq: Once | INTRAVENOUS | Status: DC | PRN
  Filled 2015-10-19: qty 5

## 2015-10-19 MED ORDER — HEPARIN SOD (PORK) LOCK FLUSH 100 UNIT/ML IV SOLN
500.0000 [IU] | Freq: Once | INTRAVENOUS | Status: AC
  Administered 2015-10-19: 500 [IU] via INTRAVENOUS

## 2015-10-19 MED ORDER — SODIUM CHLORIDE 0.9% FLUSH
10.0000 mL | Freq: Once | INTRAVENOUS | Status: AC
  Administered 2015-10-19: 10 mL via INTRAVENOUS
  Filled 2015-10-19: qty 10

## 2015-10-19 MED ORDER — SODIUM CHLORIDE 0.9 % IV SOLN
Freq: Once | INTRAVENOUS | Status: AC
  Administered 2015-10-19: 15:00:00 via INTRAVENOUS
  Filled 2015-10-19: qty 1000

## 2015-10-19 MED ORDER — PACLITAXEL PROTEIN-BOUND CHEMO INJECTION 100 MG
100.0000 mg/m2 | Freq: Once | INTRAVENOUS | Status: AC
  Administered 2015-10-19: 150 mg via INTRAVENOUS
  Filled 2015-10-19: qty 30

## 2015-10-19 NOTE — Assessment & Plan Note (Addendum)
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 Status post cycle #1 day 1; he is due for day 8 today. Proceed with chemotherapy today.  # Hemoglobin today 10.2 status post PRBC transfusion last week multifactorial-chronic kidney disease chemotherapy/IDA; iron saturations and low- recommend IV venofer q wx4.   # pain - continue oxycodone liquid.   # Follow-up with me in 2 weeks; cycle #2 day 1

## 2015-10-19 NOTE — Progress Notes (Signed)
Creatinine 1.52. Dr Rogue Bussing aware. Okay to proceed with treatment

## 2015-10-19 NOTE — Progress Notes (Signed)
.[place] NOTE  Patient Care Team: Adin Hector, MD as PCP - General (Internal Medicine) Clent Jacks, RN as Registered Nurse  Oncology History   # OCT 2016- SQUAMOUS CELL CA of middle esophagus; Stage IV [EGD bx]; PET- Esopahgeal uptake & 2 CM Left Cervical LN-MET SQUAM; Carbo-taxol [Nov 9th]with RT [Nov 14th];  Cont Weekly Carbo- RT; JAN 3rd- 2017-PET stable esophageal uptake/slight increase neck adenopathy- s/p local radiation to the neck with carboplatin-   # FEB 20th- START FOLFIRI s/p 5 cycles-May 2017- PET- Stable-esophageal uptake mediastinal LN/ Improved Left neck LN;   # June 26 PET- PROGRESSION; START ABRAXANE  # TAXOL HELD sec to ACUTE INFUSION REACTIONS  # malnutrition s/p PEG tube  # CKD [stage III]; Hx of non-invasive bladder ca [s/p cystectomy with neo-bladder; [facility]]     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   Patient denies any worsening swelling of his left neck.  Patient noted to have mild swelling of his left neck lymph node. Denies any worsening pain- except for chronic pain site of the PEG tube; he also noted mild pain this intrascapular region.  He continues to use his PEG tube ; weight stable.  He has been using 6 cans of tube feeds every day. He is able to swallow water. Has not tried solids.   No significant diarrhea. No tingling or numbness.  ROS: A complete 10 point review of system is done which is negative except mentioned above in history of present illness. One of his daughters is getting married in the next 4  days  MEDICAL HISTORY:  Past Medical History  Diagnosis Date  . Hypertension   . Bladder cancer [facility])     s/p surgery and reconstruction  . Emphysema of lung ([place])   . Esophageal cancer ([place])   . Chronic kidney disease     stage 3    SURGICAL HISTORY: Past Surgical History  Procedure Laterality Date  . Bladder removal    . Esophagogastroduodenoscopy (egd) with propofol N/A 01/21/2015    Procedure: ESOPHAGOGASTRODUODENOSCOPY (EGD) WITH PROPOFOL-looking in the esophagus stomach and upper small intestine to evaluate and treat;  Surgeon: [name], MD;  Location: [facility] ENDOSCOPY;  Service: Endoscopy;  Laterality: N/A;  . Gastrostomy w/ feeding tube Left     placed end of october 2016  . Portacath placement Right 02/04/2015    Procedure: INSERTION PORT-A-CATH;  Surgeon: [name], MD;  Location: [facility] ORS;  Service: General;  Laterality: Right;  . Lymph node biopsy N/A 02/04/2015    Procedure: LYMPH NODE BIOPSY;  Surgeon: [name], MD;  Location: [facility] ORS;  Service: General;  Laterality: N/A;  . Esophagogastroduodenoscopy N/A 05/22/2015    Procedure: ESOPHAGOGASTRODUODENOSCOPY (EGD);  Surgeon: Manya Silvas, MD;  Location: [facility] ENDOSCOPY;  Service: Endoscopy;  Laterality: N/A;    SOCIAL HISTORY: Social History   Social History  . Marital Status: Married    Spouse Name: N/A  . Number of Children: N/A  . Years of Education: N/A   Occupational History  . Not on file.   Social  History Main Topics  . Smoking status: Current Every Day Smoker -- 0.50 packs/day    Types: Cigarettes  . Smokeless tobacco: Current User    Types: Chew  . Alcohol Use: No     Comment: quit drinking 3 months ago  . Drug Use: No  . Sexual Activity: Not on file   Other Topics Concern  . Not on file   Social History Narrative   Lives at home with wife. Independent at baseline.    FAMILY HISTORY: Family History  Problem Relation Age of Onset  . CAD Father   . Breast cancer Mother      ALLERGIES:  is allergic to aspirin and taxol.  MEDICATIONS:  Current Outpatient Prescriptions  Medication Sig Dispense Refill  . amLODipine (NORVASC) 10 MG tablet Place 1 tablet (10 mg total) into feeding tube daily. 30 tablet 0  . diphenoxylate-atropine (LOMOTIL) 2.5-0.025 MG tablet Take 1 tablet by mouth 4 (four) times daily as needed for diarrhea or loose stools. Also take 1 pills 30-60 minutes prior to starting chemotherapy/week cycle. 40 tablet 3  . lidocaine-prilocaine (EMLA) cream Apply cream 1 hour before chemotherapy treatment, place a small amount of saran wrap over the cream to protect your clothing 30 g 1  . neomycin-bacitracin-polymyxin (NEOSPORIN) OINT Apply 1 application topically daily. 30 g 1  . Nutritional Supplements (FEEDING SUPPLEMENT, JEVITY 1.5 CAL/FIBER,) LIQD Place 237 mLs into feeding tube 6 (six) times daily. 1000 mL 5  . ondansetron (ZOFRAN) 8 MG tablet Take 1 tablet (8 mg total) by mouth every 8 (eight) hours as needed for nausea or vomiting (start 3 days; after chemo). 40 tablet 3  . oxyCODONE (ROXICODONE) 5 MG/5ML solution Place 5 mLs (5 mg total) into feeding tube every 8 (eight) hours as needed for severe pain. 100 mL 0  . prochlorperazine (COMPAZINE) 10 MG tablet Take 1 tablet (10 mg total) by mouth every 6 (six) hours as needed for nausea or vomiting. 40 tablet 3   No current facility-administered medications for this visit.   Facility-Administered Medications Ordered in Other Visits  Medication Dose Route Frequency Provider Last Rate Last Dose  . heparin lock flush 100 unit/mL  500 Units Intracatheter Once PRN Cammie Sickle, MD      . sodium chloride 0.9 % injection 10 mL  10 mL Intracatheter PRN Cammie Sickle, MD   10 mL at 03/04/15 0933  . sodium chloride flush (NS) 0.9 % injection 10 mL  10 mL Intracatheter PRN Cammie Sickle, MD   10 mL at 10/19/15 1400      .  PHYSICAL EXAMINATION: ECOG PERFORMANCE STATUS: 1 - Symptomatic  but completely ambulatory  Filed Vitals:   10/19/15 1437  BP: 100/68  Pulse: 99  Temp: 99.4 F (37.4 C)  Resp: 18   Filed Weights   10/19/15 1437  Weight: 118 lb 2 oz (53.581 kg)    GENERAL: Thin built; moderately nourished. Alert, no distress and comfortable.he is alone.  EYES: no pallor or icterus OROPHARYNX: no thrush or ulceration; good dentition  NECK: supple, no masses felt LYMPH:  2cm  CM palpable lymphadenopathy in the cervical on left side  LUNGS: clear to auscultation and  No wheeze or crackles HEART/CVS: regular rate & rhythm and no murmurs; No lower extremity edema ABDOMEN: abdomen soft, non-tender and normal bowel sounds; PEG tube in place Musculoskeletal:no cyanosis of digits and no clubbing  PSYCH: alert & oriented x 3 with fluent speech NEURO: no focal motor/sensory  deficits SKIN:  no rashes or significant lesions  LABORATORY DATA:  I have reviewed the data as listed Lab Results  Component Value Date   WBC 11.2* 10/19/2015   HGB 10.3* 10/19/2015   HCT 30.2* 10/19/2015   MCV 93.3 10/19/2015   PLT 217 10/19/2015    Recent Labs  10/05/15 0919 10/12/15 0925 10/19/15 1414  NA 133* 129* 132*  K 4.0 3.9 4.5  CL 97* 93* 96*  CO2 '26 26 27  ' GLUCOSE 175* 199* 110*  BUN 37* 38* 42*  CREATININE 1.76* 1.70* 1.52*  CALCIUM 8.8* 8.4* 8.8*  GFRNONAA 39* 41* 47*  GFRAA 46* 48* 55*  PROT 7.0 7.1 8.1  ALBUMIN 3.5 2.9* 3.3*  AST '21 19 15  ' ALT 9* 21 10*  ALKPHOS 47 96 67  BILITOT 0.5 0.3 0.5     ASSESSMENT & PLAN:   Cancer of middle third of esophagus (HCC) # 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 Status post cycle #1 day 1; he is due for day 8 today. Proceed with chemotherapy today.  # Hemoglobin today 10.2 status post PRBC transfusion last week multifactorial-chronic kidney disease chemotherapy/IDA; iron saturations and low- recommend IV venofer q wx4.   # pain - continue oxycodone liquid.   #  Follow-up with me in 2 weeks; cycle #2 day 1     Cammie Sickle, MD 10/19/2015 4:52 PM

## 2015-10-19 NOTE — Progress Notes (Signed)
Patient complains of being weak.  States he has a cough and is not sleeping well.  Temp 99.4

## 2015-10-22 ENCOUNTER — Telehealth: Payer: Self-pay

## 2015-10-22 NOTE — Telephone Encounter (Signed)
Nutrition Follow-up:  Attempted to call pt but no answer and mail box full and unable to accept messages.  Multiple attempts have been made to reach pt prior to today's call by [facility].    Reason for Follow-up: PEG tube feeding and loosing wt. Chart reviewed and noted on 7/17 received day 8 of abraxane. Noted pt with weakness and cough at that time  Food/Nutrition History: Noted per MD note pt taking 6 cans of jevity 1.5 daily, and drinking water. Not taking any solid foods  Medications: lomotil  Labs: 7/17 Na 132, BUN 42, creatinine 1.52, glucose 110, WBC 11.2, Hgb 10.3  Gastrointestional profile: noted per MD noted on 7/17 no diarrhea  Wt: 118 pounds 2 oz on 7/17 Wt on nutrition visit 6/14 of 114 pounds (wt gain of 4 pounds 2 oz)   Estimated Energy Needs  Kcals: [id number] kcals/d Protein: 78-104 g/d Fluid: >/= 1541ml/d   GOAL: Pt to meet at least 90% of estimated nutritional needs.  MONITOR: Tube feeding tolerance, wt trends  INTERVENTION: - Continue current tube feeding regimen of jevity 1.5, 6 cans per day as wt continues to trend up.    NEXT VISIT: follow-up in 1 month  Nghia Mcentee B. Zenia Resides, [place], [place] (pager) Weekend/On-Call pager [phone])

## 2015-10-26 NOTE — Telephone Encounter (Signed)
Attempted to call patient.  No answer and no voice mail box.

## 2015-10-30 ENCOUNTER — Telehealth: Payer: Self-pay | Admitting: *Deleted

## 2015-10-30 ENCOUNTER — Other Ambulatory Visit: Payer: Self-pay | Admitting: Internal Medicine

## 2015-10-30 ENCOUNTER — Ambulatory Visit
Admission: RE | Admit: 2015-10-30 | Discharge: 2015-10-30 | Disposition: A | Discharge status: Home / Self Care | Payer: Self-pay | Source: Ambulatory Visit | Attending: Internal Medicine | Admitting: Internal Medicine

## 2015-10-30 DIAGNOSIS — I6523 Occlusion and stenosis of bilateral carotid arteries: Secondary | ICD-10-CM | POA: Insufficient documentation

## 2015-10-30 DIAGNOSIS — R05 Cough: Secondary | ICD-10-CM | POA: Insufficient documentation

## 2015-10-30 DIAGNOSIS — R059 Cough, unspecified: Secondary | ICD-10-CM

## 2015-10-30 DIAGNOSIS — R938 Abnormal findings on diagnostic imaging of other specified body structures: Secondary | ICD-10-CM | POA: Insufficient documentation

## 2015-10-30 MED ORDER — CEPHALEXIN 500 MG PO CAPS: 500.0000 mg | ORAL_CAPSULE | Freq: Three times a day (TID) | ORAL | 0 refills | Status: DC

## 2015-10-30 NOTE — Telephone Encounter (Signed)
md spoke with patient this morning. Pt has had a productive cough and shortness of breath. md ordered a stat cxr. As of 938, pt has not yet arrive in xray dept. I contacted pt and he stated that he was waiting to get an apt for the xray. I explained that he does not need an apt for his outpatient xray. I asked him to go asap, so that Dr. Rogue Bussing would be able to get the results. Teach back process performed with pt.

## 2015-10-30 NOTE — Telephone Encounter (Signed)
Md reviewed chest xray results.  Negative for pneumonia.  MD stated that patient may have bronchitis given his current symptoms. V/o from MD called in Keflex 500 mg tid x 10 days. Pt informed abx called into pharmacy. Also encouraged pt to take otc cough suppressant such as delsym or robitussen. Teach back process performed.  I also informed pt that Joli from dietary consult-for feeding tube had made multiple attempts to contact pt for jevity recommendations. He asked me to have Jolie reattempts to reach him. He was unable to take Jolie's call at the previous attempts.

## 2015-11-02 ENCOUNTER — Inpatient Hospital Stay (HOSPITAL_BASED_OUTPATIENT_CLINIC_OR_DEPARTMENT_OTHER): Payer: Self-pay | Admitting: Internal Medicine

## 2015-11-02 ENCOUNTER — Inpatient Hospital Stay: Payer: Self-pay

## 2015-11-02 VITALS — BP 109/73 | HR 98 | Temp 97.8°F | Resp 18 | Wt 116.2 lb

## 2015-11-02 DIAGNOSIS — C154 Malignant neoplasm of middle third of esophagus: Secondary | ICD-10-CM

## 2015-11-02 DIAGNOSIS — F17210 Nicotine dependence, cigarettes, uncomplicated: Secondary | ICD-10-CM

## 2015-11-02 DIAGNOSIS — D6481 Anemia due to antineoplastic chemotherapy: Secondary | ICD-10-CM

## 2015-11-02 DIAGNOSIS — Z803 Family history of malignant neoplasm of breast: Secondary | ICD-10-CM

## 2015-11-02 DIAGNOSIS — C159 Malignant neoplasm of esophagus, unspecified: Secondary | ICD-10-CM

## 2015-11-02 DIAGNOSIS — D509 Iron deficiency anemia, unspecified: Secondary | ICD-10-CM

## 2015-11-02 DIAGNOSIS — I129 Hypertensive chronic kidney disease with stage 1 through stage 4 chronic kidney disease, or unspecified chronic kidney disease: Secondary | ICD-10-CM

## 2015-11-02 DIAGNOSIS — R590 Localized enlarged lymph nodes: Secondary | ICD-10-CM

## 2015-11-02 DIAGNOSIS — G4700 Insomnia, unspecified: Secondary | ICD-10-CM

## 2015-11-02 DIAGNOSIS — N183 Chronic kidney disease, stage 3 (moderate): Secondary | ICD-10-CM

## 2015-11-02 DIAGNOSIS — T451X5A Adverse effect of antineoplastic and immunosuppressive drugs, initial encounter: Secondary | ICD-10-CM

## 2015-11-02 DIAGNOSIS — J439 Emphysema, unspecified: Secondary | ICD-10-CM

## 2015-11-02 DIAGNOSIS — R197 Diarrhea, unspecified: Secondary | ICD-10-CM

## 2015-11-02 DIAGNOSIS — Z8551 Personal history of malignant neoplasm of bladder: Secondary | ICD-10-CM

## 2015-11-02 DIAGNOSIS — Z931 Gastrostomy status: Secondary | ICD-10-CM

## 2015-11-02 DIAGNOSIS — Z79899 Other long term (current) drug therapy: Secondary | ICD-10-CM

## 2015-11-02 DIAGNOSIS — G8929 Other chronic pain: Secondary | ICD-10-CM

## 2015-11-02 DIAGNOSIS — E46 Unspecified protein-calorie malnutrition: Secondary | ICD-10-CM

## 2015-11-02 LAB — COMPREHENSIVE METABOLIC PANEL
ALBUMIN: 3 g/dL — AB (ref 3.5–5.0)
ALK PHOS: 53 U/L (ref 38–126)
ALT: 9 U/L — AB (ref 17–63)
AST: 16 U/L (ref 15–41)
Anion gap: 11 (ref 5–15)
BUN: 41 mg/dL — ABNORMAL HIGH (ref 6–20)
CHLORIDE: 96 mmol/L — AB (ref 101–111)
CO2: 25 mmol/L (ref 22–32)
CREATININE: 1.59 mg/dL — AB (ref 0.61–1.24)
Calcium: 8.8 mg/dL — ABNORMAL LOW (ref 8.9–10.3)
GFR calc non Af Amer: 45 mL/min — ABNORMAL LOW (ref >60)
GFR, EST AFRICAN AMERICAN: 52 mL/min — AB (ref >60)
GLUCOSE: 94 mg/dL (ref 65–99)
Potassium: 4.7 mmol/L (ref 3.5–5.1)
SODIUM: 132 mmol/L — AB (ref 135–145)
Total Bilirubin: 0.3 mg/dL (ref 0.3–1.2)
Total Protein: 7.8 g/dL (ref 6.5–8.1)

## 2015-11-02 LAB — CBC WITH DIFFERENTIAL/PLATELET
BASOS ABS: 0 10*3/uL (ref 0–0.1)
BASOS PCT: 0 %
EOS ABS: 0 10*3/uL (ref 0–0.7)
EOS PCT: 0 %
HCT: 26.4 % — ABNORMAL LOW (ref 40.0–52.0)
HEMOGLOBIN: 9.2 g/dL — AB (ref 13.0–18.0)
LYMPHS ABS: 0.5 10*3/uL — AB (ref 1.0–3.6)
Lymphocytes Relative: 4 %
MCH: 32.1 pg (ref 26.0–34.0)
MCHC: 34.9 g/dL (ref 32.0–36.0)
MCV: 91.9 fL (ref 80.0–100.0)
Monocytes Absolute: 1.1 10*3/uL — ABNORMAL HIGH (ref 0.2–1.0)
Monocytes Relative: 9 %
NEUTROS PCT: 87 %
Neutro Abs: 10.9 10*3/uL — ABNORMAL HIGH (ref 1.4–6.5)
PLATELETS: 268 10*3/uL (ref 150–440)
RBC: 2.87 MIL/uL — AB (ref 4.40–5.90)
RDW: 19.2 % — ABNORMAL HIGH (ref 11.5–14.5)
WBC: 12.5 10*3/uL — AB (ref 3.8–10.6)

## 2015-11-02 LAB — SAMPLE TO BLOOD BANK

## 2015-11-02 MED ORDER — PROCHLORPERAZINE EDISYLATE 5 MG/ML IJ SOLN
10.0000 mg | Freq: Once | INTRAMUSCULAR | Status: AC
  Administered 2015-11-02: 10 mg via INTRAVENOUS
  Filled 2015-11-02: qty 2

## 2015-11-02 MED ORDER — OXYCODONE HCL 5 MG/5ML PO SOLN: 5.0000 mg | Freq: Three times a day (TID) | ORAL | 0 refills | Status: DC | PRN

## 2015-11-02 MED ORDER — PACLITAXEL PROTEIN-BOUND CHEMO INJECTION 100 MG
100.0000 mg/m2 | Freq: Once | INTRAVENOUS | Status: AC
  Administered 2015-11-02: 150 mg via INTRAVENOUS
  Filled 2015-11-02: qty 30

## 2015-11-02 MED ORDER — SODIUM CHLORIDE 0.9 % IV SOLN
Freq: Once | INTRAVENOUS | Status: AC
  Administered 2015-11-02: 14:00:00 via INTRAVENOUS
  Filled 2015-11-02: qty 1000

## 2015-11-02 MED ORDER — HEPARIN SOD (PORK) LOCK FLUSH 100 UNIT/ML IV SOLN
500.0000 [IU] | Freq: Once | INTRAVENOUS | Status: AC | PRN
  Administered 2015-11-02: 500 [IU]
  Filled 2015-11-02: qty 5

## 2015-11-02 NOTE — Assessment & Plan Note (Addendum)
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 Status post cycle # 1. No significant clinical response is noted- left side neck lymph node stable. We will plan to get a PET scan after 3 cycles or so.  # Proceed with cycle # 2 day #1 today.  # Hemoglobin today 9.2.  status post PRBC transfusion last week multifactorial-chronic kidney disease chemotherapy/IDA;   # pain - continue oxycodone liquid. New script given.   # Insominia- tylenol pm  # allergies/ cough- CXR- NEG.   # Follow-up with me in 3 weeks; cycle #3 day 1.  # Discussed the above plan with the patient and his wife.

## 2015-11-02 NOTE — Progress Notes (Signed)
.[place] NOTE  Patient Care Team: Adin Hector, MD as PCP - General (Internal Medicine) Clent Jacks, RN as Registered Nurse  Oncology History   # OCT 2016- SQUAMOUS CELL CA of middle esophagus; Stage IV [EGD bx]; PET- Esopahgeal uptake & 2 CM Left Cervical LN-MET SQUAM; Carbo-taxol [Nov 9th]with RT [Nov 14th];  Cont Weekly Carbo- RT; JAN 3rd- 2017-PET stable esophageal uptake/slight increase neck adenopathy- s/p local radiation to the neck with carboplatin-   # FEB 20th- START FOLFIRI s/p 5 cycles-May 2017- PET- Stable-esophageal uptake mediastinal LN/ Improved Left neck LN;   # June 26 PET- PROGRESSION; START ABRAXANE  # TAXOL HELD sec to ACUTE INFUSION REACTIONS  # malnutrition s/p PEG tube  # CKD [stage III]; Hx of non-invasive bladder ca [s/p cystectomy with neo-bladder; [facility]]     Squamous cell esophageal cancer ([place])   05/14/2015 Initial Diagnosis    Squamous cell esophageal cancer (HCC)      Cancer of middle third of esophagus ([employer])   10/19/2015 Initial Diagnosis    Cancer of middle third of esophagus (HCC)       HISTORY OF PRESENTING ILLNESS:  Jeffrey [name] 65 y.o. 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   He is currently status post 1 cycle tolerating well.  Patient denies any worsening swelling of his left neck. He denies any neck improvement of his neck adenopathy.  He noted to have cough with clear sputum over the last few days. No fevers. Chest x-ray negative.   Denies any worsening pain- except for chronic pain site of the PEG tube.   He continues to use his PEG tube ; weight stable.  He has been using 6 cans of tube feeds every day. He is able to swallow water. Has not tried solids.   No significant diarrhea. No tingling or numbness.  ROS: A complete 10 point review of system is done which is negative  except mentioned above in history of present illness. One of his daughters is getting married in the next 4 days  MEDICAL HISTORY:  Past Medical History:  Diagnosis Date  . Bladder cancer [facility])    s/p surgery and reconstruction  . Chronic kidney disease    stage 3  . Emphysema of lung ([place])   . Esophageal cancer ([place])   . Hypertension     SURGICAL HISTORY: Past Surgical History:  Procedure Laterality Date  . BLADDER REMOVAL    . ESOPHAGOGASTRODUODENOSCOPY N/A 05/22/2015   Procedure: ESOPHAGOGASTRODUODENOSCOPY (EGD);  Surgeon: Manya Silvas, MD;  Location: [facility] ENDOSCOPY;  Service: Endoscopy;  Laterality: N/A;  . ESOPHAGOGASTRODUODENOSCOPY (EGD) WITH PROPOFOL N/A 01/21/2015   Procedure: ESOPHAGOGASTRODUODENOSCOPY (EGD) WITH PROPOFOL-looking in the esophagus stomach and upper small intestine to evaluate and treat;  Surgeon: [name], MD;  Location: [facility] ENDOSCOPY;  Service: Endoscopy;  Laterality: N/A;  . GASTROSTOMY W/ FEEDING TUBE Left    placed end of october 2016  . LYMPH NODE BIOPSY N/A 02/04/2015   Procedure: LYMPH NODE BIOPSY;  Surgeon: [name], MD;  Location: [facility] ORS;  Service: General;  Laterality: N/A;  . PORTACATH PLACEMENT Right 02/04/2015   Procedure: INSERTION PORT-A-CATH;  Surgeon: [name], MD;  Location: [facility] ORS;  Service: General;  Laterality: Right;    SOCIAL HISTORY: Social History   Social History  . Marital status: Married    Spouse name: N/A  .  Number of children: N/A  . Years of education: N/A   Occupational History  . Not on file.   Social History Main Topics  . Smoking status: Current Every Day Smoker    Packs/day: 0.50    Types: Cigarettes  . Smokeless tobacco: Current User    Types: Chew  . Alcohol use No     Comment: quit drinking 3 months ago  . Drug use: No  . Sexual activity: Not on file   Other Topics Concern  . Not on file   Social History Narrative   Lives at home with wife. Independent at baseline.     FAMILY HISTORY: Family History  Problem Relation Age of Onset  . CAD Father   . Breast cancer Mother     ALLERGIES:  is allergic to aspirin and taxol [paclitaxel].  MEDICATIONS:  Current Outpatient Prescriptions  Medication Sig Dispense Refill  . amLODipine (NORVASC) 10 MG tablet Place 1 tablet (10 mg total) into feeding tube daily. 30 tablet 0  . cephALEXin (KEFLEX) 500 MG capsule Take 1 capsule (500 mg total) by mouth 3 (three) times daily. X 10 days 30 capsule 0  . diphenoxylate-atropine (LOMOTIL) 2.5-0.025 MG tablet Take 1 tablet by mouth 4 (four) times daily as needed for diarrhea or loose stools. Also take 1 pills 30-60 minutes prior to starting chemotherapy/week cycle. 40 tablet 3  . lidocaine-prilocaine (EMLA) cream Apply cream 1 hour before chemotherapy treatment, place a small amount of saran wrap over the cream to protect your clothing 30 g 1  . neomycin-bacitracin-polymyxin (NEOSPORIN) OINT Apply 1 application topically daily. 30 g 1  . Nutritional Supplements (FEEDING SUPPLEMENT, JEVITY 1.5 CAL/FIBER,) LIQD Place 237 mLs into feeding tube 6 (six) times daily. 1000 mL 5  . ondansetron (ZOFRAN) 8 MG tablet Take 1 tablet (8 mg total) by mouth every 8 (eight) hours as needed for nausea or vomiting (start 3 days; after chemo). 40 tablet 3  . oxyCODONE (ROXICODONE) 5 MG/5ML solution Place 5 mLs (5 mg total) into feeding tube every 8 (eight) hours as needed for severe pain. 100 mL 0  . prochlorperazine (COMPAZINE) 10 MG tablet Take 1 tablet (10 mg total) by mouth every 6 (six) hours as needed for nausea or vomiting. 40 tablet 3   No current facility-administered medications for this visit.    Facility-Administered Medications Ordered in Other Visits  Medication Dose Route Frequency Provider Last Rate Last Dose  . sodium chloride 0.9 % injection 10 mL  10 mL Intracatheter PRN Cammie Sickle, MD   10 mL at 03/04/15 0933      .  PHYSICAL EXAMINATION: ECOG PERFORMANCE  STATUS: 1 - Symptomatic but completely ambulatory  Vitals:   11/02/15 1219  BP: 109/73  Pulse: 98  Resp: 18  Temp: 97.8 F (36.6 C)   Filed Weights   11/02/15 1219  Weight: 116 lb 4 oz (52.7 kg)    GENERAL: Thin built; moderately nourished. Alert, no distress and comfortable.he is alone.  EYES: no pallor or icterus OROPHARYNX: no thrush or ulceration; good dentition  NECK: supple, no masses felt LYMPH:  2cm  CM palpable lymphadenopathy in the cervical on left side  LUNGS: clear to auscultation and  No wheeze or crackles HEART/CVS: regular rate & rhythm and no murmurs; No lower extremity edema ABDOMEN: abdomen soft, non-tender and normal bowel sounds; PEG tube in place Musculoskeletal:no cyanosis of digits and no clubbing  PSYCH: alert & oriented x 3 with fluent speech NEURO: no  focal motor/sensory deficits SKIN:  no rashes or significant lesions  LABORATORY DATA:  I have reviewed the data as listed Lab Results  Component Value Date   WBC 12.5 (H) 11/02/2015   HGB 9.2 (L) 11/02/2015   HCT 26.4 (L) 11/02/2015   MCV 91.9 11/02/2015   PLT 268 11/02/2015    Recent Labs  10/12/15 0925 10/19/15 1414 11/02/15 1142  NA 129* 132* 132*  K 3.9 4.5 4.7  CL 93* 96* 96*  CO2 '26 27 25  ' GLUCOSE 199* 110* 94  BUN 38* 42* 41*  CREATININE 1.70* 1.52* 1.59*  CALCIUM 8.4* 8.8* 8.8*  GFRNONAA 41* 47* 45*  GFRAA 48* 55* 52*  PROT 7.1 8.1 7.8  ALBUMIN 2.9* 3.3* 3.0*  AST '19 15 16  ' ALT 21 10* 9*  ALKPHOS 96 67 53  BILITOT 0.3 0.5 0.3     ASSESSMENT & PLAN:   Cancer of middle third of esophagus (HCC) # 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 Status post cycle # 1. No significant clinical response is noted- left side neck lymph node stable. We will plan to get a PET scan after 3 cycles or so.  # Proceed with cycle # 2 day #1 today.  # Hemoglobin today 9.2.  status post PRBC transfusion last week multifactorial-chronic kidney  disease chemotherapy/IDA;   # pain - continue oxycodone liquid. New script given.   # Insominia- tylenol pm  # allergies/ cough- CXR- NEG.   # Follow-up with me in 3 weeks; cycle #3 day 1.  # Discussed the above plan with the patient and his wife.    Cammie Sickle, MD 11/02/2015 5:52 PM

## 2015-11-02 NOTE — Progress Notes (Signed)
Patient states he is not sleeping well.  Wants to know if MD will prescribe something to help?

## 2015-11-09 ENCOUNTER — Inpatient Hospital Stay: Payer: Self-pay | Attending: Internal Medicine

## 2015-11-09 ENCOUNTER — Encounter (INDEPENDENT_AMBULATORY_CARE_PROVIDER_SITE_OTHER): Payer: Self-pay

## 2015-11-09 ENCOUNTER — Inpatient Hospital Stay: Payer: Self-pay

## 2015-11-09 VITALS — BP 93/63 | HR 103 | Temp 97.7°F | Resp 18

## 2015-11-09 DIAGNOSIS — I129 Hypertensive chronic kidney disease with stage 1 through stage 4 chronic kidney disease, or unspecified chronic kidney disease: Secondary | ICD-10-CM | POA: Insufficient documentation

## 2015-11-09 DIAGNOSIS — C154 Malignant neoplasm of middle third of esophagus: Secondary | ICD-10-CM

## 2015-11-09 DIAGNOSIS — F17210 Nicotine dependence, cigarettes, uncomplicated: Secondary | ICD-10-CM | POA: Insufficient documentation

## 2015-11-09 DIAGNOSIS — J439 Emphysema, unspecified: Secondary | ICD-10-CM | POA: Insufficient documentation

## 2015-11-09 DIAGNOSIS — C159 Malignant neoplasm of esophagus, unspecified: Secondary | ICD-10-CM

## 2015-11-09 DIAGNOSIS — Z931 Gastrostomy status: Secondary | ICD-10-CM | POA: Insufficient documentation

## 2015-11-09 DIAGNOSIS — Z5111 Encounter for antineoplastic chemotherapy: Secondary | ICD-10-CM | POA: Insufficient documentation

## 2015-11-09 DIAGNOSIS — Z8551 Personal history of malignant neoplasm of bladder: Secondary | ICD-10-CM | POA: Insufficient documentation

## 2015-11-09 DIAGNOSIS — E639 Nutritional deficiency, unspecified: Secondary | ICD-10-CM | POA: Insufficient documentation

## 2015-11-09 DIAGNOSIS — Z79899 Other long term (current) drug therapy: Secondary | ICD-10-CM | POA: Insufficient documentation

## 2015-11-09 DIAGNOSIS — Z803 Family history of malignant neoplasm of breast: Secondary | ICD-10-CM | POA: Insufficient documentation

## 2015-11-09 DIAGNOSIS — N189 Chronic kidney disease, unspecified: Secondary | ICD-10-CM | POA: Insufficient documentation

## 2015-11-09 LAB — CBC WITH DIFFERENTIAL/PLATELET
BASOS ABS: 0.1 10*3/uL (ref 0–0.1)
Basophils Relative: 0 %
EOS ABS: 0 10*3/uL (ref 0–0.7)
EOS PCT: 0 %
HCT: 24.8 % — ABNORMAL LOW (ref 40.0–52.0)
Hemoglobin: 8.3 g/dL — ABNORMAL LOW (ref 13.0–18.0)
Lymphocytes Relative: 6 %
Lymphs Abs: 0.8 10*3/uL — ABNORMAL LOW (ref 1.0–3.6)
MCH: 30.7 pg (ref 26.0–34.0)
MCHC: 33.3 g/dL (ref 32.0–36.0)
MCV: 92.1 fL (ref 80.0–100.0)
Monocytes Absolute: 1.3 10*3/uL — ABNORMAL HIGH (ref 0.2–1.0)
Monocytes Relative: 9 %
Neutro Abs: 11.6 10*3/uL — ABNORMAL HIGH (ref 1.4–6.5)
Neutrophils Relative %: 85 %
PLATELETS: 255 10*3/uL (ref 150–440)
RBC: 2.69 MIL/uL — AB (ref 4.40–5.90)
RDW: 19.9 % — AB (ref 11.5–14.5)
WBC: 13.7 10*3/uL — AB (ref 3.8–10.6)

## 2015-11-09 LAB — BASIC METABOLIC PANEL
ANION GAP: 9 (ref 5–15)
BUN: 38 mg/dL — AB (ref 6–20)
CALCIUM: 8.7 mg/dL — AB (ref 8.9–10.3)
CO2: 28 mmol/L (ref 22–32)
Chloride: 93 mmol/L — ABNORMAL LOW (ref 101–111)
Creatinine, Ser: 1.51 mg/dL — ABNORMAL HIGH (ref 0.61–1.24)
GFR calc Af Amer: 55 mL/min — ABNORMAL LOW (ref >60)
GFR, EST NON AFRICAN AMERICAN: 47 mL/min — AB (ref >60)
Glucose, Bld: 123 mg/dL — ABNORMAL HIGH (ref 65–99)
POTASSIUM: 4.1 mmol/L (ref 3.5–5.1)
SODIUM: 130 mmol/L — AB (ref 135–145)

## 2015-11-09 MED ORDER — SODIUM CHLORIDE 0.9 % IV SOLN
Freq: Once | INTRAVENOUS | Status: AC
  Administered 2015-11-09: 12:00:00 via INTRAVENOUS
  Filled 2015-11-09: qty 1000

## 2015-11-09 MED ORDER — HEPARIN SOD (PORK) LOCK FLUSH 100 UNIT/ML IV SOLN
500.0000 [IU] | Freq: Once | INTRAVENOUS | Status: AC | PRN
  Administered 2015-11-09: 500 [IU]
  Filled 2015-11-09: qty 5

## 2015-11-09 MED ORDER — PACLITAXEL PROTEIN-BOUND CHEMO INJECTION 100 MG
100.0000 mg/m2 | Freq: Once | INTRAVENOUS | Status: AC
  Administered 2015-11-09: 150 mg via INTRAVENOUS
  Filled 2015-11-09: qty 30

## 2015-11-09 MED ORDER — PROCHLORPERAZINE EDISYLATE 5 MG/ML IJ SOLN
10.0000 mg | Freq: Once | INTRAMUSCULAR | Status: AC
  Administered 2015-11-09: 10 mg via INTRAVENOUS
  Filled 2015-11-09: qty 2

## 2015-11-10 ENCOUNTER — Other Ambulatory Visit: Payer: Self-pay | Admitting: *Deleted

## 2015-11-10 ENCOUNTER — Telehealth: Payer: Self-pay | Admitting: *Deleted

## 2015-11-10 DIAGNOSIS — Z789 Other specified health status: Secondary | ICD-10-CM

## 2015-11-10 NOTE — Telephone Encounter (Signed)
Pt contacted Dr. Rogue Bussing this morning. Pt states that his "G-Tube 'fell' out."  Order sent to IR for g-tube replacement.

## 2015-11-10 NOTE — Telephone Encounter (Signed)
Patient called to ask about the appointment to have his feeding tube replaced.  Explained to patient that his tube will have to be replaced in radiology.  The order has been sent for this procedure to be be scheduled.  He will be notified of the appointment information as soon as it is scheduled.  Patient verbalized understanding.

## 2015-11-11 ENCOUNTER — Ambulatory Visit
Admission: RE | Admit: 2015-11-11 | Discharge: 2015-11-11 | Disposition: A | Discharge status: Home / Self Care | Payer: Self-pay | Source: Ambulatory Visit | Attending: Internal Medicine | Admitting: Internal Medicine

## 2015-11-11 ENCOUNTER — Telehealth: Payer: Self-pay | Admitting: *Deleted

## 2015-11-11 DIAGNOSIS — Y833 Surgical operation with formation of external stoma as the cause of abnormal reaction of the patient, or of later complication, without mention of misadventure at the time of the procedure: Secondary | ICD-10-CM | POA: Insufficient documentation

## 2015-11-11 DIAGNOSIS — Z789 Other specified health status: Secondary | ICD-10-CM

## 2015-11-11 DIAGNOSIS — K9423 Gastrostomy malfunction: Secondary | ICD-10-CM | POA: Insufficient documentation

## 2015-11-11 MED ORDER — MIDAZOLAM HCL 2 MG/2ML IJ SOLN
INTRAMUSCULAR | Status: AC | PRN
  Administered 2015-11-11 (×2): 1 mg via INTRAVENOUS

## 2015-11-11 MED ORDER — FENTANYL CITRATE (PF) 100 MCG/2ML IJ SOLN
INTRAMUSCULAR | Status: AC | PRN
  Administered 2015-11-11 (×2): 50 ug via INTRAVENOUS

## 2015-11-11 MED ORDER — IOPAMIDOL (ISOVUE-300) INJECTION 61%
100.0000 mL | Freq: Once | INTRAVENOUS | Status: AC | PRN
  Administered 2015-11-11: 60 mL

## 2015-11-11 MED ORDER — SODIUM CHLORIDE 0.9 % IJ SOLN
20.0000 mL | INTRAMUSCULAR | Status: DC | PRN
  Administered 2015-11-11: 20 mL

## 2015-11-11 NOTE — Procedures (Signed)
Procedure and risks discussed with patient and family. Informed consent obtained. Will perform flouro-guided gastrostomy placement.

## 2015-11-11 NOTE — Procedures (Signed)
Under flouro guidance, pre-existing gastrostomy tract was dilated to accommodate new 18-F gastrostomy catheter. No immediate complication.

## 2015-11-11 NOTE — Telephone Encounter (Signed)
Radiology called to notify MD of GI tube placement today.

## 2015-11-11 NOTE — Discharge Instructions (Signed)
Gastrostomy Tube Replacement, Care After °Refer to this sheet in the next few weeks. These instructions provide you with information on caring for yourself after your procedure. Your health care provider may also give you more specific instructions. Your treatment has been planned according to current medical practices, but problems sometimes occur. Call your health care provider if you have any problems or questions after your procedure. °WHAT TO EXPECT AFTER THE PROCEDURE  °After your procedure, it is typical to have the following:  °· Mild abdominal pain. °· A small amount of blood-tinged fluid leaking from the replacement site. °HOME CARE INSTRUCTIONS °· You may resume your normal level of activity. °· You may resume your normal feedings. °· Care for your gastrostomy tube as you did before, or as directed by your health care provider. °SEEK MEDICAL CARE IF: °· You have a fever or chills.  °· You have redness or irritation near the insertion site. °· You continue to have abdominal pain or leaking around your gastrostomy tube. °SEEK IMMEDIATE MEDICAL CARE IF:  °· You develop bleeding or significant discharge around the tube. °· You have severe abdominal pain.  °· Your new tube does not seem to be working properly. °· You are unable to get feedings into the tube. °· Your tube comes out for any reason.   °  °This information is not intended to replace advice given to you by your health care provider. Make sure you discuss any questions you have with your health care provider. °  °Document Released: 10/16/2013 Document Reviewed: 10/16/2013 °Elsevier Interactive Patient Education ©2016 Elsevier Inc. ° °

## 2015-11-23 ENCOUNTER — Telehealth: Payer: Self-pay | Admitting: *Deleted

## 2015-11-23 ENCOUNTER — Inpatient Hospital Stay (HOSPITAL_BASED_OUTPATIENT_CLINIC_OR_DEPARTMENT_OTHER): Payer: Self-pay | Admitting: Internal Medicine

## 2015-11-23 ENCOUNTER — Other Ambulatory Visit: Payer: Self-pay

## 2015-11-23 ENCOUNTER — Ambulatory Visit
Admission: RE | Admit: 2015-11-23 | Discharge: 2015-11-23 | Disposition: A | Discharge status: Home / Self Care | Payer: Self-pay | Source: Ambulatory Visit | Attending: Internal Medicine | Admitting: Internal Medicine

## 2015-11-23 ENCOUNTER — Inpatient Hospital Stay: Payer: Self-pay

## 2015-11-23 ENCOUNTER — Encounter: Payer: Self-pay | Admitting: Emergency Medicine

## 2015-11-23 ENCOUNTER — Inpatient Hospital Stay
Admission: EM | Admit: 2015-11-23 | Discharge: 2015-11-30 | DRG: 871 | Disposition: A | Discharge status: Home / Self Care | Payer: Self-pay | Attending: Specialist | Admitting: Specialist

## 2015-11-23 VITALS — Temp 95.1°F | Resp 16 | Ht 68.0 in | Wt 107.4 lb

## 2015-11-23 DIAGNOSIS — E860 Dehydration: Secondary | ICD-10-CM | POA: Insufficient documentation

## 2015-11-23 DIAGNOSIS — R1319 Other dysphagia: Secondary | ICD-10-CM | POA: Diagnosis present

## 2015-11-23 DIAGNOSIS — C154 Malignant neoplasm of middle third of esophagus: Secondary | ICD-10-CM

## 2015-11-23 DIAGNOSIS — J9600 Acute respiratory failure, unspecified whether with hypoxia or hypercapnia: Secondary | ICD-10-CM | POA: Diagnosis present

## 2015-11-23 DIAGNOSIS — J850 Gangrene and necrosis of lung: Secondary | ICD-10-CM | POA: Diagnosis present

## 2015-11-23 DIAGNOSIS — Z8551 Personal history of malignant neoplasm of bladder: Secondary | ICD-10-CM

## 2015-11-23 DIAGNOSIS — Z931 Gastrostomy status: Secondary | ICD-10-CM

## 2015-11-23 DIAGNOSIS — Z906 Acquired absence of other parts of urinary tract: Secondary | ICD-10-CM

## 2015-11-23 DIAGNOSIS — N189 Chronic kidney disease, unspecified: Secondary | ICD-10-CM

## 2015-11-23 DIAGNOSIS — Z8249 Family history of ischemic heart disease and other diseases of the circulatory system: Secondary | ICD-10-CM

## 2015-11-23 DIAGNOSIS — N170 Acute kidney failure with tubular necrosis: Secondary | ICD-10-CM | POA: Diagnosis present

## 2015-11-23 DIAGNOSIS — I959 Hypotension, unspecified: Secondary | ICD-10-CM | POA: Diagnosis present

## 2015-11-23 DIAGNOSIS — A419 Sepsis, unspecified organism: Principal | ICD-10-CM | POA: Diagnosis present

## 2015-11-23 DIAGNOSIS — Z888 Allergy status to other drugs, medicaments and biological substances status: Secondary | ICD-10-CM

## 2015-11-23 DIAGNOSIS — R059 Cough, unspecified: Secondary | ICD-10-CM

## 2015-11-23 DIAGNOSIS — Z803 Family history of malignant neoplasm of breast: Secondary | ICD-10-CM

## 2015-11-23 DIAGNOSIS — Z79899 Other long term (current) drug therapy: Secondary | ICD-10-CM

## 2015-11-23 DIAGNOSIS — N183 Chronic kidney disease, stage 3 (moderate): Secondary | ICD-10-CM | POA: Diagnosis present

## 2015-11-23 DIAGNOSIS — F17210 Nicotine dependence, cigarettes, uncomplicated: Secondary | ICD-10-CM

## 2015-11-23 DIAGNOSIS — R64 Cachexia: Secondary | ICD-10-CM | POA: Diagnosis present

## 2015-11-23 DIAGNOSIS — D631 Anemia in chronic kidney disease: Secondary | ICD-10-CM

## 2015-11-23 DIAGNOSIS — R05 Cough: Secondary | ICD-10-CM

## 2015-11-23 DIAGNOSIS — C159 Malignant neoplasm of esophagus, unspecified: Secondary | ICD-10-CM

## 2015-11-23 DIAGNOSIS — I129 Hypertensive chronic kidney disease with stage 1 through stage 4 chronic kidney disease, or unspecified chronic kidney disease: Secondary | ICD-10-CM | POA: Diagnosis present

## 2015-11-23 DIAGNOSIS — E43 Unspecified severe protein-calorie malnutrition: Secondary | ICD-10-CM | POA: Diagnosis present

## 2015-11-23 DIAGNOSIS — Z681 Body mass index (BMI) 19 or less, adult: Secondary | ICD-10-CM

## 2015-11-23 DIAGNOSIS — R918 Other nonspecific abnormal finding of lung field: Secondary | ICD-10-CM | POA: Insufficient documentation

## 2015-11-23 DIAGNOSIS — J984 Other disorders of lung: Secondary | ICD-10-CM

## 2015-11-23 DIAGNOSIS — D638 Anemia in other chronic diseases classified elsewhere: Secondary | ICD-10-CM | POA: Diagnosis present

## 2015-11-23 DIAGNOSIS — D508 Other iron deficiency anemias: Secondary | ICD-10-CM

## 2015-11-23 DIAGNOSIS — J439 Emphysema, unspecified: Secondary | ICD-10-CM

## 2015-11-23 DIAGNOSIS — C770 Secondary and unspecified malignant neoplasm of lymph nodes of head, face and neck: Secondary | ICD-10-CM | POA: Diagnosis present

## 2015-11-23 DIAGNOSIS — J189 Pneumonia, unspecified organism: Secondary | ICD-10-CM

## 2015-11-23 DIAGNOSIS — J849 Interstitial pulmonary disease, unspecified: Secondary | ICD-10-CM | POA: Insufficient documentation

## 2015-11-23 DIAGNOSIS — C799 Secondary malignant neoplasm of unspecified site: Secondary | ICD-10-CM | POA: Diagnosis present

## 2015-11-23 DIAGNOSIS — E639 Nutritional deficiency, unspecified: Secondary | ICD-10-CM

## 2015-11-23 DIAGNOSIS — J690 Pneumonitis due to inhalation of food and vomit: Secondary | ICD-10-CM | POA: Diagnosis present

## 2015-11-23 DIAGNOSIS — D6481 Anemia due to antineoplastic chemotherapy: Secondary | ICD-10-CM | POA: Diagnosis present

## 2015-11-23 LAB — CBC WITH DIFFERENTIAL/PLATELET
BASOS ABS: 0 10*3/uL (ref 0–0.1)
BASOS PCT: 0 %
Basophils Absolute: 0 10*3/uL (ref 0–0.1)
Basophils Relative: 0 %
EOS ABS: 0 10*3/uL (ref 0–0.7)
EOS PCT: 0 %
EOS PCT: 0 %
Eosinophils Absolute: 0 10*3/uL (ref 0–0.7)
HCT: 25.2 % — ABNORMAL LOW (ref 40.0–52.0)
HEMATOCRIT: 23.2 % — AB (ref 40.0–52.0)
HEMOGLOBIN: 7.7 g/dL — AB (ref 13.0–18.0)
Hemoglobin: 8.2 g/dL — ABNORMAL LOW (ref 13.0–18.0)
LYMPHS ABS: 0.4 10*3/uL — AB (ref 1.0–3.6)
LYMPHS ABS: 0.5 10*3/uL — AB (ref 1.0–3.6)
LYMPHS PCT: 2 %
Lymphocytes Relative: 3 %
MCH: 29.8 pg (ref 26.0–34.0)
MCH: 30.2 pg (ref 26.0–34.0)
MCHC: 33.1 g/dL (ref 32.0–36.0)
MCHC: 32.6 g/dL (ref 32.0–36.0)
MCV: 90.1 fL (ref 80.0–100.0)
MCV: 92.7 fL (ref 80.0–100.0)
Monocytes Absolute: 1 10*3/uL (ref 0.2–1.0)
Monocytes Absolute: 0.8 10*3/uL (ref 0.2–1.0)
Monocytes Relative: 5 %
Monocytes Relative: 5 %
NEUTROS ABS: 20.2 10*3/uL — AB (ref 1.4–6.5)
NEUTROS PCT: 93 %
Neutro Abs: 16.7 10*3/uL — ABNORMAL HIGH (ref 1.4–6.5)
Neutrophils Relative %: 92 %
PLATELETS: 245 10*3/uL (ref 150–440)
PLATELETS: 236 10*3/uL (ref 150–440)
RBC: 2.58 MIL/uL — AB (ref 4.40–5.90)
RBC: 2.72 MIL/uL — AB (ref 4.40–5.90)
RDW: 20.3 % — ABNORMAL HIGH (ref 11.5–14.5)
RDW: 20.6 % — ABNORMAL HIGH (ref 11.5–14.5)
WBC: 21.6 10*3/uL — AB (ref 3.8–10.6)
WBC: 18.1 10*3/uL — AB (ref 3.8–10.6)

## 2015-11-23 LAB — URINALYSIS COMPLETE WITH MICROSCOPIC (ARMC ONLY)
BILIRUBIN URINE: NEGATIVE
Glucose, UA: NEGATIVE mg/dL
Hgb urine dipstick: NEGATIVE
KETONES UR: NEGATIVE mg/dL
NITRITE: NEGATIVE
PH: 7 (ref 5.0–8.0)
PROTEIN: 30 mg/dL — AB
SPECIFIC GRAVITY, URINE: 1.011 (ref 1.005–1.030)
SQUAMOUS EPITHELIAL / LPF: NONE SEEN

## 2015-11-23 LAB — COMPREHENSIVE METABOLIC PANEL
ALBUMIN: 2.4 g/dL — AB (ref 3.5–5.0)
ALK PHOS: 74 U/L (ref 38–126)
ALT: 10 U/L — AB (ref 17–63)
ALT: 7 U/L — ABNORMAL LOW (ref 17–63)
ANION GAP: 13 (ref 5–15)
AST: 17 U/L (ref 15–41)
AST: 16 U/L (ref 15–41)
Albumin: 2.5 g/dL — ABNORMAL LOW (ref 3.5–5.0)
Alkaline Phosphatase: 70 U/L (ref 38–126)
Anion gap: 12 (ref 5–15)
BILIRUBIN TOTAL: 0.7 mg/dL (ref 0.3–1.2)
BILIRUBIN TOTAL: 0.7 mg/dL (ref 0.3–1.2)
BUN: 96 mg/dL — AB (ref 6–20)
BUN: 92 mg/dL — ABNORMAL HIGH (ref 6–20)
CALCIUM: 8.9 mg/dL (ref 8.9–10.3)
CALCIUM: 8.9 mg/dL (ref 8.9–10.3)
CHLORIDE: 97 mmol/L — AB (ref 101–111)
CO2: 24 mmol/L (ref 22–32)
CO2: 22 mmol/L (ref 22–32)
CREATININE: 2.17 mg/dL — AB (ref 0.61–1.24)
CREATININE: 2.15 mg/dL — AB (ref 0.61–1.24)
Chloride: 102 mmol/L (ref 101–111)
GFR, EST AFRICAN AMERICAN: 35 mL/min — AB (ref >60)
GFR, EST AFRICAN AMERICAN: 36 mL/min — AB (ref >60)
GFR, EST NON AFRICAN AMERICAN: 31 mL/min — AB (ref >60)
GFR, EST NON AFRICAN AMERICAN: 31 mL/min — AB (ref >60)
Glucose, Bld: 135 mg/dL — ABNORMAL HIGH (ref 65–99)
Glucose, Bld: 111 mg/dL — ABNORMAL HIGH (ref 65–99)
Potassium: 4.6 mmol/L (ref 3.5–5.1)
Potassium: 4.7 mmol/L (ref 3.5–5.1)
SODIUM: 137 mmol/L (ref 135–145)
Sodium: 133 mmol/L — ABNORMAL LOW (ref 135–145)
TOTAL PROTEIN: 7.6 g/dL (ref 6.5–8.1)
Total Protein: 8.2 g/dL — ABNORMAL HIGH (ref 6.5–8.1)

## 2015-11-23 LAB — TROPONIN I: Troponin I: 0.03 ng/mL (ref <0.03)

## 2015-11-23 LAB — LACTIC ACID, PLASMA
LACTIC ACID, VENOUS: 2.2 mmol/L — AB (ref 0.5–1.9)
LACTIC ACID, VENOUS: 1.2 mmol/L (ref 0.5–1.9)

## 2015-11-23 LAB — PROTIME-INR
INR: 1.13
PROTHROMBIN TIME: 14.6 s (ref 11.4–15.2)

## 2015-11-23 LAB — HEMOGLOBIN AND HEMATOCRIT, BLOOD
HCT: 20.3 % — ABNORMAL LOW (ref 40.0–52.0)
Hemoglobin: 6.7 g/dL — ABNORMAL LOW (ref 13.0–18.0)

## 2015-11-23 LAB — APTT: APTT: 36 s (ref 24–36)

## 2015-11-23 MED ORDER — ACETAMINOPHEN 325 MG PO TABS
650.0000 mg | ORAL_TABLET | Freq: Four times a day (QID) | ORAL | Status: DC | PRN
Start: 2015-11-23 — End: 2015-11-30

## 2015-11-23 MED ORDER — OXYCODONE HCL 5 MG PO TABS
5.0000 mg | ORAL_TABLET | ORAL | Status: DC | PRN
  Administered 2015-11-24 (×2): 5 mg via ORAL
  Filled 2015-11-23 (×2): qty 1

## 2015-11-23 MED ORDER — AMOXICILLIN-POT CLAVULANATE 875-125 MG PO TABS: ORAL_TABLET | ORAL | 0 refills | Status: DC

## 2015-11-23 MED ORDER — ALTEPLASE 2 MG IJ SOLR: 2.0000 mg | Freq: Once | INTRAMUSCULAR | Status: DC | PRN

## 2015-11-23 MED ORDER — ONDANSETRON HCL 4 MG PO TABS: 4.0000 mg | ORAL_TABLET | Freq: Four times a day (QID) | ORAL | Status: DC | PRN

## 2015-11-23 MED ORDER — SODIUM CHLORIDE 0.9 % IJ SOLN
3.0000 mL | Freq: Once | INTRAMUSCULAR | Status: DC | PRN
  Filled 2015-11-23: qty 10

## 2015-11-23 MED ORDER — ENOXAPARIN SODIUM 40 MG/0.4ML ~~LOC~~ SOLN: 40.0000 mg | SUBCUTANEOUS | Status: DC

## 2015-11-23 MED ORDER — SODIUM CHLORIDE 0.9 % IV SOLN
Freq: Once | INTRAVENOUS | Status: DC
  Filled 2015-11-23: qty 1000

## 2015-11-23 MED ORDER — ONDANSETRON HCL 4 MG/2ML IJ SOLN: 4.0000 mg | Freq: Four times a day (QID) | INTRAMUSCULAR | Status: DC | PRN

## 2015-11-23 MED ORDER — SODIUM CHLORIDE 0.9 % IV SOLN
INTRAVENOUS | Status: DC
  Administered 2015-11-23 – 2015-11-24 (×4): via INTRAVENOUS

## 2015-11-23 MED ORDER — PIPERACILLIN-TAZOBACTAM 3.375 G IVPB
3.3750 g | Freq: Three times a day (TID) | INTRAVENOUS | Status: DC
  Administered 2015-11-24 – 2015-11-30 (×20): 3.375 g via INTRAVENOUS
  Filled 2015-11-23 (×23): qty 50

## 2015-11-23 MED ORDER — PIPERACILLIN-TAZOBACTAM 3.375 G IVPB 30 MIN
3.3750 g | Freq: Once | INTRAVENOUS | Status: AC
  Administered 2015-11-23: 3.375 g via INTRAVENOUS
  Filled 2015-11-23: qty 50

## 2015-11-23 MED ORDER — LEVOFLOXACIN IN D5W 750 MG/150ML IV SOLN
750.0000 mg | Freq: Once | INTRAVENOUS | Status: DC
  Administered 2015-11-23: 750 mg via INTRAVENOUS
  Filled 2015-11-23: qty 150

## 2015-11-23 MED ORDER — VANCOMYCIN HCL IN DEXTROSE 1-5 GM/200ML-% IV SOLN
1000.0000 mg | Freq: Once | INTRAVENOUS | Status: AC
  Administered 2015-11-23: 1000 mg via INTRAVENOUS
  Filled 2015-11-23: qty 200

## 2015-11-23 MED ORDER — ACETAMINOPHEN 650 MG RE SUPP: 650.0000 mg | Freq: Four times a day (QID) | RECTAL | Status: DC | PRN

## 2015-11-23 MED ORDER — HEPARIN SOD (PORK) LOCK FLUSH 100 UNIT/ML IV SOLN
500.0000 [IU] | Freq: Once | INTRAVENOUS | Status: AC | PRN
  Administered 2015-11-23: 500 [IU]
  Filled 2015-11-23: qty 5

## 2015-11-23 MED ORDER — SODIUM CHLORIDE 0.9 % IV BOLUS (SEPSIS)
1500.0000 mL | Freq: Once | INTRAVENOUS | Status: AC
  Administered 2015-11-23: 1500 mL via INTRAVENOUS

## 2015-11-23 MED ORDER — OXYCODONE HCL 5 MG/5ML PO SOLN: 5.0000 mg | Freq: Three times a day (TID) | ORAL | 0 refills | Status: DC | PRN

## 2015-11-23 MED ORDER — SODIUM CHLORIDE 0.9 % IJ SOLN
10.0000 mL | INTRAMUSCULAR | Status: DC | PRN
  Administered 2015-11-23: 10 mL
  Filled 2015-11-23: qty 10

## 2015-11-23 MED ORDER — VANCOMYCIN HCL IN DEXTROSE 750-5 MG/150ML-% IV SOLN
750.0000 mg | INTRAVENOUS | Status: DC
  Filled 2015-11-23: qty 150

## 2015-11-23 MED ORDER — SODIUM CHLORIDE 0.9 % IV SOLN
INTRAVENOUS | Status: DC | PRN
  Administered 2015-11-23: 13:00:00 via INTRAVENOUS
  Filled 2015-11-23: qty 1000

## 2015-11-23 MED ORDER — HEPARIN SODIUM (PORCINE) 5000 UNIT/ML IJ SOLN
5000.0000 [IU] | Freq: Three times a day (TID) | INTRAMUSCULAR | Status: DC
  Administered 2015-11-23 – 2015-11-28 (×14): 5000 [IU] via SUBCUTANEOUS
  Filled 2015-11-23 (×14): qty 1

## 2015-11-23 NOTE — ED Notes (Signed)
1C nurse busy, will call back for report in 5.

## 2015-11-23 NOTE — H&P (Signed)
[facility] [place] NAME: Jeffrey [name]    MR#:  [id number]  DATE OF BIRTH:  12-25-1950   DATE OF ADMISSION:  11/23/2015  PRIMARY CARE PHYSICIAN: Adin Hector, MD   REQUESTING/REFERRING PHYSICIAN: Edd Fabian  CHIEF COMPLAINT:   Chief Complaint  Patient presents with  . general sickness    HISTORY OF PRESENT ILLNESS:  Jeffrey [name]  is a 65 y.o. male with a known history of Esophageal cancer on active chemotherapy who is presenting with cough. Patient states he's had 3 or 4 days of cough productive of yellowish sputum subjective fever but no chills along with generalized malaise. He went to his cancer treatment session today described his symptoms he was then sent for chest x-ray for concern for pneumonia. Chest x-ray positive post present Hospital further workup and evaluation. He does also attests to having dyspnea on exertion but denies any shortness of breath at this minute.  PAST MEDICAL HISTORY:   Past Medical History:  Diagnosis Date  . Bladder cancer [facility])    s/p surgery and reconstruction  . Chronic kidney disease    stage 3  . Emphysema of lung ([place])   . Esophageal cancer ([place])   . Hypertension     PAST SURGICAL HISTORY:   Past Surgical History:  Procedure Laterality Date  . BLADDER REMOVAL    . ESOPHAGOGASTRODUODENOSCOPY N/A 05/22/2015   Procedure: ESOPHAGOGASTRODUODENOSCOPY (EGD);  Surgeon: Manya Silvas, MD;  Location: [facility] ENDOSCOPY;  Service: Endoscopy;  Laterality: N/A;  . ESOPHAGOGASTRODUODENOSCOPY (EGD) WITH PROPOFOL N/A 01/21/2015   Procedure: ESOPHAGOGASTRODUODENOSCOPY (EGD) WITH PROPOFOL-looking in the esophagus stomach and upper small intestine to evaluate and treat;  Surgeon: [name], MD;  Location: [facility] ENDOSCOPY;  Service: Endoscopy;  Laterality: N/A;  . GASTROSTOMY W/ FEEDING TUBE Left    placed end of october 2016  . LYMPH NODE BIOPSY N/A 02/04/2015   Procedure: LYMPH NODE BIOPSY;  Surgeon:  [name], MD;  Location: [facility] ORS;  Service: General;  Laterality: N/A;  . PORTACATH PLACEMENT Right 02/04/2015   Procedure: INSERTION PORT-A-CATH;  Surgeon: [name], MD;  Location: [facility] ORS;  Service: General;  Laterality: Right;    SOCIAL HISTORY:   Social History  Substance Use Topics  . Smoking status: Current Every Day Smoker    Packs/day: 0.50    Years: 47.00    Types: Cigarettes  . Smokeless tobacco: Current User    Types: Chew  . Alcohol use No     Comment: quit drinking 3 months ago    FAMILY HISTORY:   Family History  Problem Relation Age of Onset  . CAD Father   . Breast cancer Mother     DRUG ALLERGIES:   Allergies  Allergen Reactions  . Aspirin Nausea Only  . Taxol [Paclitaxel] Other (See Comments)    Diaphoretic, flushing and tightness in chest    REVIEW OF SYSTEMS:  REVIEW OF SYSTEMS:  CONSTITUTIONAL: Positive fevers, , fatigue, weakness.  EYES: Denies blurred vision, double vision, or eye pain.  EARS, NOSE, THROAT: Denies tinnitus, ear pain, hearing loss.  RESPIRATORY: Positive cough, shortness of breath, denies wheezing  CARDIOVASCULAR: Denies chest pain, palpitations, edema.  GASTROINTESTINAL: Denies nausea, vomiting, diarrhea, abdominal pain.  GENITOURINARY: Denies dysuria, hematuria.  ENDOCRINE: Denies nocturia or thyroid problems. HEMATOLOGIC AND LYMPHATIC: Denies easy bruising or bleeding.  SKIN: Denies rash or lesions.  MUSCULOSKELETAL: Denies pain in neck, back, shoulder, knees, hips, or further arthritic symptoms.  NEUROLOGIC: Denies paralysis, paresthesias.  PSYCHIATRIC: Denies anxiety or depressive symptoms. Otherwise full review of systems performed by me is negative.   MEDICATIONS AT HOME:   Prior to Admission medications   Medication Sig Start Date End Date Taking? Authorizing Provider  amLODipine (NORVASC) 10 MG tablet Place 1 tablet (10 mg total) into feeding tube daily. 05/23/15   Vaughan Basta, MD    amoxicillin-clavulanate (AUGMENTIN) 875-125 MG tablet Thru PG tube twice ad ay 11/23/15   Cammie Sickle, MD  diphenoxylate-atropine (LOMOTIL) 2.5-0.025 MG tablet Take 1 tablet by mouth 4 (four) times daily as needed for diarrhea or loose stools. Also take 1 pills 30-60 minutes prior to starting chemotherapy/week cycle. Patient not taking: Reported on 11/23/2015 09/07/15   Cammie Sickle, MD  lidocaine-prilocaine (EMLA) cream Apply cream 1 hour before chemotherapy treatment, place a small amount of saran wrap over the cream to protect your clothing 02/10/15   Cammie Sickle, MD  neomycin-bacitracin-polymyxin (NEOSPORIN) OINT Apply 1 application topically daily. 01/24/15   Tracie Harrier, MD  Nutritional Supplements (FEEDING SUPPLEMENT, JEVITY 1.5 CAL/FIBER,) LIQD Place 237 mLs into feeding tube 6 (six) times daily. 01/24/15   Vishwanath Hande, MD  ondansetron (ZOFRAN) 8 MG tablet Take 1 tablet (8 mg total) by mouth every 8 (eight) hours as needed for nausea or vomiting (start 3 days; after chemo). Patient not taking: Reported on 11/23/2015 09/07/15   Cammie Sickle, MD  oxyCODONE (ROXICODONE) 5 MG/5ML solution Place 5 mLs (5 mg total) into feeding tube every 8 (eight) hours as needed for severe pain. 11/23/15   Cammie Sickle, MD  prochlorperazine (COMPAZINE) 10 MG tablet Take 1 tablet (10 mg total) by mouth every 6 (six) hours as needed for nausea or vomiting. Patient not taking: Reported on 11/23/2015 10/05/15   Cammie Sickle, MD      VITAL SIGNS:  Blood pressure 106/72, pulse (!) 108, temperature 97.4 F (36.3 C), temperature source Oral, resp. rate (!) 21, height 5\' 7"  (1.702 m), weight 52.6 kg (115 lb 14.4 oz), SpO2 100 %.  PHYSICAL EXAMINATION:  VITAL SIGNS: Vitals:   11/23/15 1717 11/23/15 1725  BP:  106/72  Pulse: (!) 113 (!) 108  Resp: 19 (!) 21  Temp:     GENERAL:65 y.o.male currently in no acute distress.Chronically ill  HEAD: Normocephalic,  atraumatic.  EYES: Pupils equal, round, reactive to light. Extraocular muscles intact. No scleral icterus.  MOUTH: Moist mucosal membrane. Dentition intact. No abscess noted.  EAR, NOSE, THROAT: Clear without exudates. No external lesions.  NECK: Supple. No thyromegaly. No nodules. No JVD.  PULMONARY: Scant coarse rhonchi right-sided  without wheeze No use of accessory muscles, Good respiratory effort. good air entry bilaterally CHEST: Nontender to palpation. Port accessed right chest CARDIOVASCULAR: S1 and S2. Regular rate and rhythm. No murmurs, rubs, or gallops. No edema. Pedal pulses 2+ bilaterally.  GASTROINTESTINAL: PEG tube in place, Soft, nontender, nondistended. No masses. Positive bowel sounds. No hepatosplenomegaly.  MUSCULOSKELETAL: No swelling, clubbing, or edema. Range of motion full in all extremities.  NEUROLOGIC: Cranial nerves II through XII are intact. No gross focal neurological deficits. Sensation intact. Reflexes intact.  SKIN: No ulceration, lesions, rashes, or cyanosis. Skin warm and dry. Turgor intact.  PSYCHIATRIC: Mood, affect within normal limits. The patient is awake, alert and oriented x 3. Insight, judgment intact.    LABORATORY PANEL:   CBC  Recent Labs Lab 11/23/15 1640  WBC 18.1*  HGB 8.2*  HCT 25.2*  PLT 236   ------------------------------------------------------------------------------------------------------------------  Chemistries   Recent Labs Lab 11/23/15 1640  NA 137  K 4.7  CL 102  CO2 22  GLUCOSE 111*  BUN 92*  CREATININE 2.15*  CALCIUM 8.9  AST 16  ALT 7*  ALKPHOS 70  BILITOT 0.7   ------------------------------------------------------------------------------------------------------------------  Cardiac Enzymes  Recent Labs Lab 11/23/15 1640  TROPONINI 0.03*   ------------------------------------------------------------------------------------------------------------------  RADIOLOGY:  Dg Chest 2 View  Result  Date: 11/23/2015 CLINICAL DATA:  Esophageal cancer.  Cough, dehydration. EXAM: CHEST  2 VIEW COMPARISON:  Chest x-ray dated 10/30/2015. FINDINGS: New cavitary mass/consolidation within the suprahilar right upper lobe, measuring approximately 6.6 x 5.8 cm. Additional mild perihilar edema, right slightly greater than left. No pleural effusion or pneumothorax seen. Heart size is normal. Right chest wall Port-A-Cath stable in position with tip at the level of the mid SVC. Osseous structures about the chest are unremarkable. Gastrostomy tube in place. IMPRESSION: 1. New cavitary mass/consolidation within the right upper lobe, suprahilar, measuring approximately 6.6 x 5.8 cm. This could represent cavitary pneumonia or cavitary metastasis. Favor cavitary pneumonia. 2. Mild interstitial edema.  No pleural effusion. 3. Heart size is normal. These results were called by telephone at the time of interpretation on 11/23/2015 at 3:44 pm to Dr. Charlaine Dalton , who verbally acknowledged these results. Electronically Signed   By: Franki Cabot M.D.   On: 11/23/2015 15:45    EKG:   Orders placed or performed during the hospital encounter of 11/23/15  . ED EKG 12-Lead  . ED EKG 12-Lead    IMPRESSION AND PLAN:   [name] history of esophageal cancer on active chemotherapy presenting with cough  1.Sepsis, meeting septic criteria by leukocytosis, heart rate present on arrival. Source pneumonia  Panculture. Broad-spectrum antibiotics including vancomycin/Zosyn and taper antibiotics when culture data returns.   Continue IV fluid hydration to keep mean arterial pressure greater than 65. may require pressor therapy if blood pressure worsens. We will repeat lactic acid if the initial is greater than 2.2.   2. Malnutrition: Has been taking 4 cans of Jevity via PEG tube supposed to be taking 6 but unable to given generalized sickness, consult dietary  3. Essential hypertension: Norvasc 4.  Esophageal cancer: Consult oncology 5. Venous thrombi embolism prophylactic: Heparin   All the records are reviewed and case discussed with ED provider. Management plans discussed with the patient, family and they are in agreement.  CODE STATUS: Full  TOTAL TIME TAKING CARE OF THIS PATIENT: 40 minutes.    Hower,  Karenann Cai.D on 11/23/2015 at 5:44 PM  Between 7am to 6pm - Pager - [phone]  After 6pm: House Pager: - [phone]  [facility]  Office  [phone]  CC: Primary care physician; Adin Hector, MD

## 2015-11-23 NOTE — Progress Notes (Signed)
.[place] NOTE  Patient Care Team: Adin Hector, MD as PCP - General (Internal Medicine) Clent Jacks, RN as Registered Nurse  Oncology History   # OCT 2016- SQUAMOUS CELL CA of middle esophagus; Stage IV [EGD bx]; PET- Esopahgeal uptake & 2 CM Left Cervical LN-MET SQUAM; Carbo-taxol [Nov 9th]with RT [Nov 14th];  Cont Weekly Carbo- RT; JAN 3rd- 2017-PET stable esophageal uptake/slight increase neck adenopathy- s/p local radiation to the neck with carboplatin-   # FEB 20th- START FOLFIRI s/p 5 cycles-May 2017- PET- Stable-esophageal uptake mediastinal LN/ Improved Left neck LN;   # June 26 PET- PROGRESSION; START ABRAXANE  # TAXOL HELD sec to ACUTE INFUSION REACTIONS  # malnutrition s/p PEG tube  # CKD [stage III]; Hx of non-invasive bladder ca [s/p cystectomy with neo-bladder; [facility]]     Squamous cell esophageal cancer ([place])   05/14/2015 Initial Diagnosis    Squamous cell esophageal cancer (HCC)       Cancer of middle third of esophagus ([place])   10/19/2015 Initial Diagnosis    Cancer of middle third of esophagus (HCC)        HISTORY OF PRESENTING ILLNESS:  Jeffrey [name] 65 y.o. 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   He is currently status post 2 cycle tolerating well. Patient denies any worsening swelling of his left neck. He denies any neck improvement of his neck adenopathy.  Patient complains of worsening cough with clear sputum for the last many days. No fevers. He feels fatigued. He is in a wheelchair today.  Overall he feels poorly.  He continues to use his PEG tube ; weight stable.  He has been using 4 cans of tube feeds every day. He has lost weight. He is able to swallow water. Has not tried solids.   No significant diarrhea. No tingling or numbness.  Denies any worsening pain- except for chronic pain site of  the PEG tube.  ROS: A complete 10 point review of system is done which is negative except mentioned above in history of present illness.  MEDICAL HISTORY:  Past Medical History:  Diagnosis Date  . Bladder cancer [facility])    s/p surgery and reconstruction  . Chronic kidney disease    stage 3  . Emphysema of lung ([place])   . Esophageal cancer ([place])   . Hypertension     SURGICAL HISTORY: Past Surgical History:  Procedure Laterality Date  . BLADDER REMOVAL    . ESOPHAGOGASTRODUODENOSCOPY N/A 05/22/2015   Procedure: ESOPHAGOGASTRODUODENOSCOPY (EGD);  Surgeon: Manya Silvas, MD;  Location: [facility] ENDOSCOPY;  Service: Endoscopy;  Laterality: N/A;  . ESOPHAGOGASTRODUODENOSCOPY (EGD) WITH PROPOFOL N/A 01/21/2015   Procedure: ESOPHAGOGASTRODUODENOSCOPY (EGD) WITH PROPOFOL-looking in the esophagus stomach and upper small intestine to evaluate and treat;  Surgeon: [name], MD;  Location: [facility] ENDOSCOPY;  Service: Endoscopy;  Laterality: N/A;  . GASTROSTOMY W/ FEEDING TUBE Left    placed end of october 2016  . LYMPH NODE BIOPSY N/A 02/04/2015   Procedure: LYMPH NODE BIOPSY;  Surgeon: [name], MD;  Location: [facility] ORS;  Service: General;  Laterality: N/A;  . PORTACATH PLACEMENT Right 02/04/2015   Procedure: INSERTION PORT-A-CATH;  Surgeon: [name], MD;  Location: [facility] ORS;  Service: General;  Laterality: Right;    SOCIAL HISTORY: Social History   Social History  . Marital status: Married    Spouse name:  N/A  . Number of children: N/A  . Years of education: N/A   Occupational History  . Not on file.   Social History Main Topics  . Smoking status: Current Every Day Smoker    Packs/day: 0.50    Years: 47.00    Types: Cigarettes  . Smokeless tobacco: Current User    Types: Chew  . Alcohol use No     Comment: quit drinking 3 months ago  . Drug use: No  . Sexual activity: Not on file   Other Topics Concern  . Not on file   Social History Narrative   Lives at home  with wife. Independent at baseline.    FAMILY HISTORY: Family History  Problem Relation Age of Onset  . CAD Father   . Breast cancer Mother     ALLERGIES:  is allergic to aspirin and taxol [paclitaxel].  MEDICATIONS:  Current Outpatient Prescriptions  Medication Sig Dispense Refill  . amLODipine (NORVASC) 10 MG tablet Place 1 tablet (10 mg total) into feeding tube daily. 30 tablet 0  . lidocaine-prilocaine (EMLA) cream Apply cream 1 hour before chemotherapy treatment, place a small amount of saran wrap over the cream to protect your clothing 30 g 1  . neomycin-bacitracin-polymyxin (NEOSPORIN) OINT Apply 1 application topically daily. 30 g 1  . Nutritional Supplements (FEEDING SUPPLEMENT, JEVITY 1.5 CAL/FIBER,) LIQD Place 237 mLs into feeding tube 6 (six) times daily. 1000 mL 5  . oxyCODONE (ROXICODONE) 5 MG/5ML solution Place 5 mLs (5 mg total) into feeding tube every 8 (eight) hours as needed for severe pain. 100 mL 0  . amoxicillin-clavulanate (AUGMENTIN) 875-125 MG tablet Thru PG tube twice ad ay 20 tablet 0  . diphenoxylate-atropine (LOMOTIL) 2.5-0.025 MG tablet Take 1 tablet by mouth 4 (four) times daily as needed for diarrhea or loose stools. Also take 1 pills 30-60 minutes prior to starting chemotherapy/week cycle. (Patient not taking: Reported on 11/23/2015) 40 tablet 3  . ondansetron (ZOFRAN) 8 MG tablet Take 1 tablet (8 mg total) by mouth every 8 (eight) hours as needed for nausea or vomiting (start 3 days; after chemo). (Patient not taking: Reported on 11/23/2015) 40 tablet 3  . prochlorperazine (COMPAZINE) 10 MG tablet Take 1 tablet (10 mg total) by mouth every 6 (six) hours as needed for nausea or vomiting. (Patient not taking: Reported on 11/23/2015) 40 tablet 3   No current facility-administered medications for this visit.    Facility-Administered Medications Ordered in Other Visits  Medication Dose Route Frequency Provider Last Rate Last Dose  . 0.9 %  sodium chloride  infusion   Intravenous Once Cammie Sickle, MD      . 0.9 %  sodium chloride infusion   Intravenous PRN Cammie Sickle, MD 500 mL/hr at 11/23/15 1234    . alteplase (CATHFLO ACTIVASE) injection 2 mg  2 mg Intracatheter Once PRN Cammie Sickle, MD      . heparin lock flush 100 unit/mL  500 Units Intracatheter Once PRN Cammie Sickle, MD      . sodium chloride 0.9 % injection 10 mL  10 mL Intracatheter PRN Cammie Sickle, MD   10 mL at 03/04/15 0933  . sodium chloride 0.9 % injection 10 mL  10 mL Intracatheter PRN Cammie Sickle, MD   10 mL at 11/23/15 1130  . sodium chloride 0.9 % injection 3 mL  3 mL Intravenous Once PRN Cammie Sickle, MD          .  PHYSICAL EXAMINATION: ECOG PERFORMANCE STATUS: 1 - Symptomatic but completely ambulatory  Vitals:   11/23/15 1102  Resp: 16  Temp: (!) 95.1 F (35.1 C)   Filed Weights   11/23/15 1102  Weight: 107 lb 6.4 oz (48.7 kg)    GENERAL: Thin built; moderately nourished. Alert, no distress.he is Accompanied by his wife. EYES: no pallor or icterus OROPHARYNX: no thrush or ulceration; good dentition  NECK: supple, no masses felt LYMPH:  2cm  CM palpable lymphadenopathy in the cervical on left side  LUNGS: Decreased breath sounds bilaterally No wheeze or crackles HEART/CVS: regular rate & rhythm and no murmurs; No lower extremity edema ABDOMEN: abdomen soft, non-tender and normal bowel sounds; PEG tube in place Musculoskeletal:no cyanosis of digits and no clubbing  PSYCH: alert & oriented x 3 with fluent speech NEURO: no focal motor/sensory deficits SKIN:  no rashes or significant lesions  LABORATORY DATA:  I have reviewed the data as listed Lab Results  Component Value Date   WBC 21.6 (H) 11/23/2015   HGB 7.7 (L) 11/23/2015   HCT 23.2 (L) 11/23/2015   MCV 90.1 11/23/2015   PLT 245 11/23/2015    Recent Labs  10/19/15 1414 11/02/15 1142 11/09/15 1053 11/23/15 1044  NA 132* 132* 130*  133*  K 4.5 4.7 4.1 4.6  CL 96* 96* 93* 97*  CO2 '27 25 28 24  ' GLUCOSE 110* 94 123* 135*  BUN 42* 41* 38* 96*  CREATININE 1.52* 1.59* 1.51* 2.17*  CALCIUM 8.8* 8.8* 8.7* 8.9  GFRNONAA 47* 45* 47* 31*  GFRAA 55* 52* 55* 35*  PROT 8.1 7.8  --  8.2*  ALBUMIN 3.3* 3.0*  --  2.5*  AST 15 16  --  17  ALT 10* 9*  --  10*  ALKPHOS 67 53  --  74  BILITOT 0.5 0.3  --  0.7     ASSESSMENT & PLAN:   Cancer of middle third of esophagus (HCC) # 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 Status post cycle # 2. No significant clinical response is noted- left side neck lymph node stable. We will plan to get a PET scan after 3 cycles or so.  # cough/productive-  HOLD Chemo today. Plan CXR; UA/self cath; blood cultures; start Augumentin through the PEG tube.  # Hemoglobin today 7.8. Monitor for now.   # pain - continue oxycodone liquid. Stable.   #  Discussed the above plan with the patient and his wife. Follow up in appx 10 days- MD/Lbs/ chemo.     Cammie Sickle, MD 11/23/2015 1:20 PM

## 2015-11-23 NOTE — ED Notes (Signed)
Pt. BP not to 1C parameters, Dr. Lavetta Nielsen notified and admit to CCU stepdown will be initiated.

## 2015-11-23 NOTE — Assessment & Plan Note (Addendum)
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 Status post cycle # 2. No significant clinical response is noted- left side neck lymph node stable. We will plan to get a PET scan after 3 cycles or so.  # cough/productive- question pneumonia versus others. White count 22. [ no growth factor support.] HOLD Chemo today. Plan CXR; UA/self cath; blood cultures; start Augumentin through the PEG tube.  # Hemoglobin today 7.8. Monitor for now.   # pain - continue oxycodone liquid. Stable.   #  Discussed the above plan with the patient and his wife. Follow up in appx 10 days- MD/Lbs/ chemo.

## 2015-11-23 NOTE — ED Triage Notes (Signed)
Sent to ED by Dr. Alesia Richards for "fluids and antibiotics"

## 2015-11-23 NOTE — Progress Notes (Addendum)
Pharmacy Antibiotic Note  Jeffrey [name] is a 65 y.o. male admitted on 11/23/2015 with pneumonia.  Pharmacy has been consulted for Vancomycin/Zosyn dosing.  Plan: Vancomycin 750 mg IV every 36 hours.  Goal trough 15-20 mcg/mL. Zosyn 3.375g IV q8h (4 hour infusion).  Plan on ordering a vancomycin trough prior to the fourth or fifth dose. Monitor CrCl. Recommend ordering a MRSA PCR.   Ke=0.026 Vd=36.82 T1/2=26.65 Stacked= 18 hr  Height: 5\' 7"  (170.2 cm) Weight: 115 lb 14.4 oz (52.6 kg) IBW/kg (Calculated) : 66.1  Temp (24hrs), Avg:96.3 F (35.7 C), Min:95.1 F (35.1 C), Max:97.4 F (36.3 C)   Recent Labs Lab 11/23/15 1044 11/23/15 1630 11/23/15 1640  WBC 21.6*  --  18.1*  CREATININE 2.17*  --  2.15*  LATICACIDVEN  --  2.2*  --     Estimated Creatinine Clearance: 26.2 mL/min (by C-G formula based on SCr of 2.15 mg/dL).    Allergies  Allergen Reactions  . Aspirin Nausea Only  . Taxol [Paclitaxel] Other (See Comments)    Diaphoretic, flushing and tightness in chest    Antimicrobials this admission: Vancomycin 8/21 >>  Zosyn 8/21 >>  Levofloxacin 8/21>>    Microbiology results: 8/21 BCx: in process 8/21 UCx: in process   Thank you for allowing pharmacy to be a part of this patient's care.  Loree Fee 11/23/2015 5:38 PM

## 2015-11-23 NOTE — Progress Notes (Signed)
Patient became hypotensive, change admit location to stepdown - bolus ivf

## 2015-11-23 NOTE — Progress Notes (Signed)
Pt reporting he is coughing up an moderate thick yellow to brown mucus with a cold he has had.  Reports he is only taking in 4 cans of Jevity 1.5 and hasn't been able to tolerate the 6 he needs.  Pt has had weight loss.

## 2015-11-23 NOTE — ED Provider Notes (Signed)
[facility] Emergency Department Provider Note   ____________________________________________   First MD Initiated Contact with Patient 11/23/15 1626     (approximate)  I have reviewed the triage vital signs and the nursing notes.   HISTORY  Chief Complaint general sickness    HPI PLES FRAGER is a 65 y.o. 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 Patient reports cough and shortness of breath constantly for the past 3 days, worse with any movement/exertion, severe. He has also had chest pain with cough. No abdominal pain, vomiting or diarrhea. Seen by his oncologist today and referred to the emergency department for pneumonia which was noted on chest x-ray as well as hypotension.   Past Medical History:  Diagnosis Date  . Bladder cancer [facility])    s/p surgery and reconstruction  . Chronic kidney disease    stage 3  . Emphysema of lung ([place])   . Esophageal cancer ([place])   . Hypertension     Patient Active Problem List   Diagnosis Date Noted  . Sepsis ([place]) 11/23/2015  . Cough 10/30/2015  . Cancer of middle third of esophagus ([place]) 10/19/2015  . Other iron deficiency anemias 10/05/2015  . Adverse effect of paclitaxel 09/25/2015  . Encounter for antineoplastic chemotherapy 09/21/2015  . Weight loss 09/21/2015  . Anemia due to chemotherapy 09/21/2015  . Diarrhea 09/21/2015  . Status post insertion of percutaneous endoscopic gastrostomy (PEG) tube ([place]) 09/21/2015  . Nausea & vomiting 05/20/2015  . Squamous cell esophageal cancer ([place]) 05/14/2015  . 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 Esophageal cancer ([place]) 02/04/2015  . Esophageal mass 01/21/2015  . Dehydration  01/21/2015  . CKD (chronic kidney disease) stage 3, GFR 30-59 ml/min 01/21/2015  . Bladder cancer ([place]) 01/21/2015  . Protein-calorie malnutrition, severe 01/21/2015  . Dysphagia 01/20/2015    Past Surgical History:  Procedure Laterality Date  . BLADDER REMOVAL    . ESOPHAGOGASTRODUODENOSCOPY N/A 05/22/2015   Procedure: ESOPHAGOGASTRODUODENOSCOPY (EGD);  Surgeon: Manya Silvas, MD;  Location: [facility] ENDOSCOPY;  Service: Endoscopy;  Laterality: N/A;  . ESOPHAGOGASTRODUODENOSCOPY (EGD) WITH PROPOFOL N/A 01/21/2015   Procedure: ESOPHAGOGASTRODUODENOSCOPY (EGD) WITH PROPOFOL-looking in the esophagus stomach and upper small intestine to evaluate and treat;  Surgeon: [name], MD;  Location: [facility] ENDOSCOPY;  Service: Endoscopy;  Laterality: N/A;  . GASTROSTOMY W/ FEEDING TUBE Left    placed end of october 2016  . LYMPH NODE BIOPSY N/A 02/04/2015   Procedure: LYMPH NODE BIOPSY;  Surgeon: [name], MD;  Location: [facility] ORS;  Service: General;  Laterality: N/A;  . PORTACATH PLACEMENT Right 02/04/2015   Procedure: INSERTION PORT-A-CATH;  Surgeon: [name], MD;  Location: [facility] ORS;  Service: General;  Laterality: Right;    Prior to Admission medications   Medication Sig Start Date End Date Taking? Authorizing Provider  amLODipine (NORVASC) 10 MG tablet Place 1 tablet (10 mg total) into feeding tube daily. 05/23/15   Vaughan Basta, MD  amoxicillin-clavulanate (AUGMENTIN) 875-125 MG tablet Thru PG tube twice ad ay 11/23/15   Cammie Sickle, MD  diphenoxylate-atropine (LOMOTIL) 2.5-0.025 MG tablet Take 1 tablet by mouth 4 (four) times daily as needed for diarrhea or loose stools. Also take 1 pills 30-60 minutes prior to starting chemotherapy/week cycle. Patient not taking:  Reported on 11/23/2015 09/07/15   Cammie Sickle, MD  lidocaine-prilocaine (EMLA) cream Apply cream 1 hour before chemotherapy treatment, place a small amount of saran wrap over the cream to protect your  clothing 02/10/15   Cammie Sickle, MD  neomycin-bacitracin-polymyxin (NEOSPORIN) OINT Apply 1 application topically daily. 01/24/15   Tracie Harrier, MD  Nutritional Supplements (FEEDING SUPPLEMENT, JEVITY 1.5 CAL/FIBER,) LIQD Place 237 mLs into feeding tube 6 (six) times daily. 01/24/15   Vishwanath Hande, MD  ondansetron (ZOFRAN) 8 MG tablet Take 1 tablet (8 mg total) by mouth every 8 (eight) hours as needed for nausea or vomiting (start 3 days; after chemo). Patient not taking: Reported on 11/23/2015 09/07/15   Cammie Sickle, MD  oxyCODONE (ROXICODONE) 5 MG/5ML solution Place 5 mLs (5 mg total) into feeding tube every 8 (eight) hours as needed for severe pain. 11/23/15   Cammie Sickle, MD  prochlorperazine (COMPAZINE) 10 MG tablet Take 1 tablet (10 mg total) by mouth every 6 (six) hours as needed for nausea or vomiting. Patient not taking: Reported on 11/23/2015 10/05/15   Cammie Sickle, MD    Allergies Aspirin and Taxol [paclitaxel]  Family History  Problem Relation Age of Onset  . CAD Father   . Breast cancer Mother     Social History Social History  Substance Use Topics  . Smoking status: Current Every Day Smoker    Packs/day: 0.50    Years: 47.00    Types: Cigarettes  . Smokeless tobacco: Current User    Types: Chew  . Alcohol use No     Comment: quit drinking 3 months ago    Review of Systems Constitutional: No fever, +chills Eyes: No visual changes. ENT: No sore throat. Cardiovascular: +chest pain with cough. Respiratory:+shortness of breath. Gastrointestinal: No abdominal pain.  No nausea, no vomiting.  No diarrhea.  No constipation. Genitourinary: Negative for dysuria. Musculoskeletal: Negative for back pain. Skin: Negative for rash. Neurological: Negative for headaches, focal weakness or numbness.  10-point ROS otherwise negative.  ____________________________________________   PHYSICAL EXAM: Vital Signs Vital Signs -  Temp: 97.4 F  (36.3 C)  Temp Source: Oral  Pulse Rate: 124  Pulse Rate Source: Dinamap  Resp: 18  BP: 88/69  MAP (mmHg): 61  BP Location: Right Arm   Vitals:   11/23/15 1700 11/23/15 1706 11/23/15 1717 11/23/15 1725  BP:  98/69  106/72  Pulse: (!) 105  (!) 113 (!) 108  Resp:  15 19 (!) 21  Temp:      TempSrc:      SpO2: 98%  100% 100%  Weight:      Height:        VITAL SIGNS: ED Triage Vitals [11/23/15 1620]  Enc Vitals Group     BP      Pulse      Resp      Temp      Temp src      SpO2      Weight 107 lb 6.4 oz (48.7 kg)     Height 5\' 7"  (1.702 m)     Head Circumference      Peak Flow      Pain Score 0     Pain Loc      Pain Edu?      Excl. in [place]?     Constitutional: Alert and oriented.Thin and ill-appearing, in no acute distress. Eyes: Conjunctivae are normal. PERRL. EOMI. Head: Atraumatic. Nose: No congestion/rhinnorhea. Mouth/Throat: Mucous membranes  are moist.  Oropharynx non-erythematous. Neck: No stridor.  Supple without meningismus. Cardiovascular: Tachycardic rate, regular rhythm. Grossly normal heart sounds.  Good peripheral circulation. Respiratory: Normal respiratory effort.  No retractions. Decreased breath sounds in the right lung fields. Gastrointestinal: Soft and nontender. No distention.  No CVA tenderness. PEG tube in the left abdomen. Genitourinary: Deferred Musculoskeletal: No lower extremity tenderness nor edema.  No joint effusions. Neurologic:  Normal speech and language. No gross focal neurologic deficits are appreciated.  Skin:  Skin is warm, dry and intact. No rash noted. Psychiatric: Mood and affect are normal. Speech and behavior are normal.  ____________________________________________   LABS (all labs ordered are listed, but only abnormal results are displayed)  Labs Reviewed  LACTIC ACID, PLASMA - Abnormal; Notable for the following:       Result Value   Lactic Acid, Venous 2.2 (*)    All other components within normal limits  COMPREHENSIVE  METABOLIC PANEL - Abnormal; Notable for the following:    Glucose, Bld 111 (*)    BUN 92 (*)    Creatinine, Ser 2.15 (*)    Albumin 2.4 (*)    ALT 7 (*)    GFR calc non Af Amer 31 (*)    GFR calc Af Amer 36 (*)    All other components within normal limits  TROPONIN I - Abnormal; Notable for the following:    Troponin I 0.03 (*)    All other components within normal limits  CBC WITH DIFFERENTIAL/PLATELET - Abnormal; Notable for the following:    WBC 18.1 (*)    RBC 2.72 (*)    Hemoglobin 8.2 (*)    HCT 25.2 (*)    RDW 20.6 (*)    Neutro Abs 16.7 (*)    Lymphs Abs 0.5 (*)    All other components within normal limits  CULTURE, BLOOD (ROUTINE X 2)  CULTURE, BLOOD (ROUTINE X 2)  URINE CULTURE  APTT  PROTIME-INR  LACTIC ACID, PLASMA  URINALYSIS COMPLETEWITH MICROSCOPIC ([facility] ONLY)  CBC  CREATININE, SERUM  BASIC METABOLIC PANEL  CBC   ____________________________________________  EKG  ED ECG REPORT I, Joanne Gavel, the attending physician, personally viewed and interpreted this ECG.   Date: 11/23/2015  EKG Time: 17:08  Rate: 115  Rhythm: sinus tachycardia  Axis: normal  Intervals:none  ST&T Change: No acute ST elevation or acute ST depression. Baseline wander in V4, V5, V6.  ____________________________________________  RADIOLOGY  CXR IMPRESSION: 1. New cavitary mass/consolidation within the right upper lobe, suprahilar, measuring approximately 6.6 x 5.8 cm. This could represent cavitary pneumonia or cavitary metastasis. Favor cavitary pneumonia. 2. Mild interstitial edema.  No pleural effusion. 3. Heart size is normal. These results were called by telephone at the time of interpretation on 11/23/2015 at 3:44 pm to Dr. Charlaine Dalton , who verbally acknowledged these results.  ____________________________________________   PROCEDURES  Procedure(s) performed: None  Procedures  Critical Care performed: Yes, see critical care note(s).  CRITICAL  CARE Performed by: Loura Pardon A   Total critical care time: 35 minutes  Critical care time was exclusive of separately billable procedures and treating other patients.  Critical care was necessary to treat or prevent imminent or life-threatening deterioration.  Critical care was time spent personally by me on the following activities: development of treatment plan with patient and/or surrogate as well as nursing, discussions with consultants, evaluation of patient's response to treatment, examination of patient, obtaining history from patient or surrogate, ordering and performing treatments  and interventions, ordering and review of laboratory studies, ordering and review of radiographic studies, pulse oximetry and re-evaluation of patient's condition.  ____________________________________________   INITIAL IMPRESSION / ASSESSMENT AND PLAN / ED COURSE  Pertinent labs & imaging results that were available during my care of the patient were reviewed by me and considered in my medical decision making (see chart for details).  Jeffrey [name] is a 65 y.o. 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 On arrival to the emergency department he is hypotensive with a blood pressure of 88/69, tachycardic with a heart rate of 124, mildly tachypneic, afebrile. He does have diminished breath sounds in the right upper lung fields. I reviewed his labs were attained earlier today which showed a leukocytosis with a white blood cell count of greater than 20,000. He needs multiple criteria for sepsis, I reviewed his outpatient x-ray which favors her right-sided cavitary pneumonia, we'll treat for age With vanco, Zosyn, Levaquin. Code sepsis initiated, patient  receiving liberal IV fluids.  ----------------------------------------- 5:26 PM on 11/23/2015 ----------------------------------------- Heart rate improving to 113 bpm, blood pressure improving to 98/69, maintaining adequate maps. Troponin is elevated at 0.03, question NSTEMI versus demand ischemia. Creatinine elevated at 2.15. Case discussed with hospitalist for admission at this time.    Clinical Course     ____________________________________________   FINAL CLINICAL IMPRESSION(S) / ED DIAGNOSES  Final diagnoses:  Sepsis, due to unspecified organism ([place])  Healthcare-associated pneumonia      NEW MEDICATIONS STARTED DURING THIS VISIT:  New Prescriptions   No medications on file     Note:  This document was prepared using Dragon voice recognition software and may include unintentional dictation errors.    Joanne Gavel, MD 11/23/15 [phone]

## 2015-11-23 NOTE — Telephone Encounter (Signed)
Spoke with Mrs. Owusu. She gave verbal understanding that her husband's cxr demonstrated pneumonia.  Per direction of md, advised pt to go to ED to be admitted.

## 2015-11-24 ENCOUNTER — Inpatient Hospital Stay: Payer: Self-pay

## 2015-11-24 LAB — BASIC METABOLIC PANEL
ANION GAP: 10 (ref 5–15)
BUN: 76 mg/dL — ABNORMAL HIGH (ref 6–20)
CALCIUM: 8.3 mg/dL — AB (ref 8.9–10.3)
CHLORIDE: 111 mmol/L (ref 101–111)
CO2: 21 mmol/L — AB (ref 22–32)
Creatinine, Ser: 1.73 mg/dL — ABNORMAL HIGH (ref 0.61–1.24)
GFR calc Af Amer: 47 mL/min — ABNORMAL LOW (ref >60)
GFR calc non Af Amer: 40 mL/min — ABNORMAL LOW (ref >60)
GLUCOSE: 95 mg/dL (ref 65–99)
Potassium: 4.3 mmol/L (ref 3.5–5.1)
Sodium: 142 mmol/L (ref 135–145)

## 2015-11-24 LAB — EXPECTORATED SPUTUM ASSESSMENT W GRAM STAIN, RFLX TO RESP C

## 2015-11-24 LAB — CBC
HEMATOCRIT: 20 % — AB (ref 40.0–52.0)
HEMOGLOBIN: 6.6 g/dL — AB (ref 13.0–18.0)
MCH: 30.1 pg (ref 26.0–34.0)
MCHC: 33.1 g/dL (ref 32.0–36.0)
MCV: 91 fL (ref 80.0–100.0)
Platelets: 206 10*3/uL (ref 150–440)
RBC: 2.2 MIL/uL — AB (ref 4.40–5.90)
RDW: 19.9 % — ABNORMAL HIGH (ref 11.5–14.5)
WBC: 19.3 10*3/uL — ABNORMAL HIGH (ref 3.8–10.6)

## 2015-11-24 LAB — HEMOGLOBIN AND HEMATOCRIT, BLOOD
HCT: 25.8 % — ABNORMAL LOW (ref 40.0–52.0)
HEMOGLOBIN: 8.8 g/dL — AB (ref 13.0–18.0)

## 2015-11-24 LAB — EXPECTORATED SPUTUM ASSESSMENT W REFEX TO RESP CULTURE

## 2015-11-24 LAB — GASTRIC OCCULT BLOOD (1-CARD TO LAB)
Occult Blood, Gastric: NEGATIVE
pH, Gastric: 3

## 2015-11-24 LAB — MRSA PCR SCREENING: MRSA BY PCR: NEGATIVE

## 2015-11-24 LAB — PREPARE RBC (CROSSMATCH)

## 2015-11-24 MED ORDER — SODIUM CHLORIDE 0.9 % IV BOLUS (SEPSIS)
1000.0000 mL | Freq: Once | INTRAVENOUS | Status: AC
  Administered 2015-11-24: 1000 mL via INTRAVENOUS

## 2015-11-24 MED ORDER — SODIUM CHLORIDE 0.9 % IV SOLN
Freq: Once | INTRAVENOUS | Status: AC
  Administered 2015-11-24: 05:00:00 via INTRAVENOUS

## 2015-11-24 MED ORDER — CETYLPYRIDINIUM CHLORIDE 0.05 % MT LIQD
7.0000 mL | Freq: Two times a day (BID) | OROMUCOSAL | Status: DC
  Administered 2015-11-24 – 2015-11-26 (×6): 7 mL via OROMUCOSAL

## 2015-11-24 MED ORDER — TRIPLE ANTIBIOTIC 3.5-400-5000 EX OINT
1.0000 "application " | TOPICAL_OINTMENT | Freq: Every day | CUTANEOUS | Status: DC
  Administered 2015-11-24 – 2015-11-27 (×4): 1 via TOPICAL
  Filled 2015-11-24 (×2): qty 1

## 2015-11-24 MED ORDER — AMLODIPINE BESYLATE 10 MG PO TABS: 10.0000 mg | ORAL_TABLET | Freq: Every day | ORAL | Status: DC

## 2015-11-24 MED ORDER — IPRATROPIUM-ALBUTEROL 0.5-2.5 (3) MG/3ML IN SOLN: 3.0000 mL | RESPIRATORY_TRACT | Status: DC | PRN

## 2015-11-24 MED ORDER — JEVITY 1.5 CAL/FIBER PO LIQD
237.0000 mL | Freq: Every day | ORAL | Status: DC
  Administered 2015-11-24 – 2015-11-30 (×36): 237 mL

## 2015-11-24 MED ORDER — OXYCODONE HCL 5 MG/5ML PO SOLN
5.0000 mg | Freq: Three times a day (TID) | ORAL | Status: DC | PRN
  Administered 2015-11-24 – 2015-11-25 (×3): 5 mg
  Filled 2015-11-24 (×3): qty 5

## 2015-11-24 MED ORDER — VANCOMYCIN HCL 500 MG IV SOLR
500.0000 mg | INTRAVENOUS | Status: DC
  Administered 2015-11-24 – 2015-11-25 (×2): 500 mg via INTRAVENOUS
  Filled 2015-11-24 (×2): qty 500

## 2015-11-24 NOTE — Plan of Care (Signed)
Problem: Physical Regulation: Goal: Will remain free from infection Outcome: Not Progressing Pt admitted with medical dx of pneumonia. Pt on Zosyn and Vancomycin. Currently afebrile. Receiving 1st unit of prbc's.

## 2015-11-24 NOTE — ED Notes (Signed)
Pt. Up and walked by this RN per Nursing Supervisor request, vitals remained unchanged, pt. Denied complications and this RN feels pt. Stable to go to 1C as previously determined. Pt. Remaining stable in this condition since arrival to ED. Pt. States he feels "good" and doesn't believe step down is necessary.

## 2015-11-24 NOTE — Progress Notes (Addendum)
[place] [facility] [place] NAME: Jeffrey [name]    MR#:  [id number]  DATE OF BIRTH:  05-18-1950  SUBJECTIVE:  CHIEF COMPLAINT:   Chief Complaint  Patient presents with  . general sickness   Has brownish sputum. No blood. No vomiting.  Has chronic dysphagia due to esophageal cancer and has a PEG tube in place. Patient had choking episodes with water and stopped everything through mouth in July.  Continues to be hypotensive  REVIEW OF SYSTEMS:    Review of Systems  Constitutional: Positive for malaise/fatigue and weight loss. Negative for chills and fever.  HENT: Negative for sore throat.   Eyes: Negative for blurred vision, double vision and pain.  Respiratory: Positive for cough, sputum production and shortness of breath. Negative for hemoptysis and wheezing.   Cardiovascular: Positive for chest pain. Negative for palpitations, orthopnea and leg swelling.  Gastrointestinal: Negative for abdominal pain, constipation, diarrhea, heartburn, nausea and vomiting.  Genitourinary: Negative for dysuria and hematuria.  Musculoskeletal: Negative for back pain and joint pain.  Skin: Negative for rash.  Neurological: Positive for weakness. Negative for sensory change, speech change, focal weakness and headaches.  Endo/Heme/Allergies: Does not bruise/bleed easily.  Psychiatric/Behavioral: Negative for depression. The patient is not nervous/anxious.     DRUG ALLERGIES:   Allergies  Allergen Reactions  . Aspirin Nausea Only  . Taxol [Paclitaxel] Other (See Comments)    Diaphoretic, flushing and tightness in chest    VITALS:  Blood pressure (!) 88/54, pulse 96, temperature 97.9 F (36.6 C), temperature source Oral, resp. rate 18, height 5\' 7"  (1.702 m), weight 51.3 kg (113 lb), SpO2 100 %.  PHYSICAL EXAMINATION:   Physical Exam  GENERAL:  65 y.o.-year-old patient lying in the bed with no acute distress.  EYES: Pupils equal, round,  reactive to light and accommodation. No scleral icterus. Extraocular muscles intact.  HEENT: Head atraumatic, normocephalic. Oropharynx and nasopharynx clear.  NECK:  Supple, no jugular venous distention. No thyroid enlargement, no tenderness.  LUNGS: Normal breath sounds bilaterally, no wheezing, rales, rhonchi. No use of accessory muscles of respiration.  CARDIOVASCULAR: S1, S2 normal. No murmurs, rubs, or gallops.  ABDOMEN: Soft, nontender, nondistended. Bowel sounds present. No organomegaly or mass.  EXTREMITIES: No cyanosis, clubbing or edema b/l.    NEUROLOGIC: Cranial nerves II through XII are intact. No focal Motor or sensory deficits b/l.   PSYCHIATRIC: The patient is alert and oriented x 3.  SKIN: No obvious rash, lesion, or ulcer.   LABORATORY PANEL:   CBC  Recent Labs Lab 11/24/15 0525  WBC 19.3*  HGB 6.6*  HCT 20.0*  PLT 206   ------------------------------------------------------------------------------------------------------------------ Chemistries   Recent Labs Lab 11/23/15 1640 11/24/15 0525  NA 137 142  K 4.7 4.3  CL 102 111  CO2 22 21*  GLUCOSE 111* 95  BUN 92* 76*  CREATININE 2.15* 1.73*  CALCIUM 8.9 8.3*  AST 16  --   ALT 7*  --   ALKPHOS 70  --   BILITOT 0.7  --    ------------------------------------------------------------------------------------------------------------------  Cardiac Enzymes  Recent Labs Lab 11/23/15 1640  TROPONINI 0.03*   ------------------------------------------------------------------------------------------------------------------  RADIOLOGY:  Dg Chest 2 View  Result Date: 11/23/2015 CLINICAL DATA:  Esophageal cancer.  Cough, dehydration. EXAM: CHEST  2 VIEW COMPARISON:  Chest x-ray dated 10/30/2015. FINDINGS: New cavitary mass/consolidation within the suprahilar right upper lobe, measuring approximately 6.6 x 5.8 cm. Additional mild perihilar edema, right slightly greater than left. No  pleural effusion or  pneumothorax seen. Heart size is normal. Right chest wall Port-A-Cath stable in position with tip at the level of the mid SVC. Osseous structures about the chest are unremarkable. Gastrostomy tube in place. IMPRESSION: 1. New cavitary mass/consolidation within the right upper lobe, suprahilar, measuring approximately 6.6 x 5.8 cm. This could represent cavitary pneumonia or cavitary metastasis. Favor cavitary pneumonia. 2. Mild interstitial edema.  No pleural effusion. 3. Heart size is normal. These results were called by telephone at the time of interpretation on 11/23/2015 at 3:44 pm to Dr. Charlaine Dalton , who verbally acknowledged these results. Electronically Signed   By: Franki Cabot M.D.   On: 11/23/2015 15:45     ASSESSMENT AND PLAN:   [name] history of esophageal cancer on active chemotherapy presenting with cough  * Right upper lobe cavitary lesion. Abscess or tumor Sepsis present on admission. We'll continue IV antibiotics.  Check MRSA PCR Sputum culture sent and pending Discontinue vancomycin if MRSA PCR negative Discussed with Dr. Juanell Fairly of pulmonary. Ordered CT scan of the chest.  * Sepsis Bolus 1 L normal saline. Patient continues to be hypotensive.  * Acutely worsening chronic anemia - could be due to chemotherapy or bleeding from his esophageal cancer. Patient is getting 2 units packed RBC transfusion. Repeat hemoglobin after transfusion. Check stool for Hemoccult. We will also check for occult blood from the PEG tube.  * AKI over CKD3 Improving  * Malnutrition: Has been taking 4 cans of Jevity via PEG tube supposed to be taking 6 but unable to given generalized sickness, consulted dietary  * Essential hypertension Stop Norvasc due to hypotension  * Esophageal cancer: Consult oncology  * DVT prophylaxis SCDs due to anemia  All the records are reviewed and case discussed with Care Management/Social Workerr. Management plans  discussed with the patient, family and they are in agreement.  CODE STATUS: FULL CODE  DVT Prophylaxis: SCDs  TOTAL CC TIME TAKING CARE OF THIS PATIENT:  minutes.   POSSIBLE D/C IN 2-3 DAYS, DEPENDING ON CLINICAL CONDITION.  Hillary Bow R M.D on 11/24/2015 at 12:00 PM  Between 7am to 6pm - Pager - [phone]  After 6pm go to [email or web address] - password [email or web address] [facility]  Office  [phone]  CC: Primary care physician; Adin Hector, MD  Note: This dictation was prepared with Dragon dictation along with smaller phrase technology. Any transcriptional errors that result from this process are unintentional.

## 2015-11-24 NOTE — Progress Notes (Signed)
Initial Nutrition Assessment  DOCUMENTATION CODES:   Severe malnutrition in context of chronic illness  INTERVENTION:  -Recommend continuing jevity 1.5, 6 cans at this time during admission.  If unable to tolerate may need to consider continuous feeding or night time feeding.  Provides 2133 kcals, 91 g/d, 1053ml/d of free water in formula.  Will need additional free water of 237ml 4 times per day with feeding to meet hydration needs.   NUTRITION DIAGNOSIS:   Malnutrition related to cancer and cancer related treatments as evidenced by severe depletion of body fat, severe depletion of muscle mass.    GOAL:   Patient will meet greater than or equal to 90% of their needs    MONITOR:   Labs, Weight trends, TF tolerance  REASON FOR ASSESSMENT:   Consult Enteral/tube feeding initiation and management  ASSESSMENT:      Pt admitted with dehydration, hypotension, pneumonia.  Pt currently on chemotherapy Abraxane last dose on 7/31. Reports weakness, no energy. Pt getting blood this am.  Past Medical History:  Diagnosis Date  . Bladder cancer [facility])    s/p surgery and reconstruction  . Chronic kidney disease    stage 3  . Emphysema of lung ([place])   . Esophageal cancer ([place])   . Hypertension    Pt reports for the last few weeks has only been taking 4 cans of jevity 1.5, previously taking 6 cans.  Reports has been feeling so weak, no energy and can administer 6 cans of jevity.  At times reports does get full feeling with tube feeding but has been able to manage 6 cans in the past.  Reports no nausea, vomiting prior to admission with tube feeding. Pt administering tube feeding upon visit to room, RN Stanton Kidney at bedside  Pt reports that he has not been taking anything via mouth (ie water etc) due to having issues with coughing shortly after drinking.    Noted new PEG placed on 8/8 by IR (18 French gastrostomy tube placed)  Medications reviewed: NS at 18ml/hr Labs reviewed: BUN 76,  creatinine 1.73, hgb 6.6  Nutrition-Focused physical exam completed. Findings are moderate to severe  fat depletion, moderate to severe muscle depletion, and no edema.    Diet Order:  Diet NPO time specified  Skin:  Reviewed, no issues  Last BM:  8/20, reports no diarrhea, BM every day or every other day  Height:   Ht Readings from Last 1 Encounters:  11/24/15 5\' 7"  (1.702 m)    Weight: 6% wt loss in the last 2 months  Wt Readings from Last 1 Encounters:  11/24/15 113 lb (51.3 kg)    Ideal Body Weight:     BMI:  Body mass index is 17.7 kg/m.  Estimated Nutritional Needs:   Kcal:  [id number] kcals/d  Protein:  61-77 g/d  Fluid:  >/+ 1751ml/d  EDUCATION NEEDS:   No education needs identified at this time  Hannibal Skalla B. Zenia Resides, [place], [place] (pager) Weekend/On-Call pager [phone])

## 2015-11-24 NOTE — Progress Notes (Signed)
Pharmacy Antibiotic Note  Jeffrey [name] is a 65 y.o. male admitted on 11/23/2015 with pneumonia/sepsis.  Pharmacy has been consulted for Vancomycin & piperacillin/tazobactam dosing.  Plan: Patients renal function has improved since admission (SCr decreased from 2.1 to 1.7) Will change vancomycin dose to 500 mg IV q24h Goal vancomycin trough 15-20 mcg/mL Vancomycin trough ordered for 8/24 @ 1030  Ke=0.030 Vd=35 L T1/2=23 hours Stacked= 18 hr dose beginning this morning  Height: 5\' 7"  (170.2 cm) Weight: 113 lb (51.3 kg) IBW/kg (Calculated) : 66.1  Temp (24hrs), Avg:97.9 F (36.6 C), Min:95.1 F (35.1 C), Max:98.8 F (37.1 C)   Recent Labs Lab 11/23/15 1044 11/23/15 1630 11/23/15 1640 11/23/15 2041 11/24/15 0525  WBC 21.6*  --  18.1*  --  19.3*  CREATININE 2.17*  --  2.15*  --  1.73*  LATICACIDVEN  --  2.2*  --  1.2  --     Estimated Creatinine Clearance: 31.7 mL/min (by C-G formula based on SCr of 1.73 mg/dL).    Allergies  Allergen Reactions  . Aspirin Nausea Only  . Taxol [Paclitaxel] Other (See Comments)    Diaphoretic, flushing and tightness in chest    Antimicrobials this admission: Vancomycin 8/21 >>  Zosyn 8/21 >>  Levofloxacin 8/21>> 8/21   Microbiology results: 8/21 BCx: no growth < 12 hours   Thank you for allowing pharmacy to be a part of this patient's care.  Lenis Noon, PharmD, BCPS 11/24/2015 10:21 AM

## 2015-11-25 DIAGNOSIS — F17210 Nicotine dependence, cigarettes, uncomplicated: Secondary | ICD-10-CM

## 2015-11-25 DIAGNOSIS — C779 Secondary and unspecified malignant neoplasm of lymph node, unspecified: Secondary | ICD-10-CM

## 2015-11-25 DIAGNOSIS — D649 Anemia, unspecified: Secondary | ICD-10-CM

## 2015-11-25 DIAGNOSIS — J690 Pneumonitis due to inhalation of food and vomit: Secondary | ICD-10-CM

## 2015-11-25 DIAGNOSIS — J189 Pneumonia, unspecified organism: Secondary | ICD-10-CM

## 2015-11-25 DIAGNOSIS — I129 Hypertensive chronic kidney disease with stage 1 through stage 4 chronic kidney disease, or unspecified chronic kidney disease: Secondary | ICD-10-CM

## 2015-11-25 DIAGNOSIS — I959 Hypotension, unspecified: Secondary | ICD-10-CM

## 2015-11-25 DIAGNOSIS — J439 Emphysema, unspecified: Secondary | ICD-10-CM

## 2015-11-25 DIAGNOSIS — C159 Malignant neoplasm of esophagus, unspecified: Secondary | ICD-10-CM

## 2015-11-25 DIAGNOSIS — Z8551 Personal history of malignant neoplasm of bladder: Secondary | ICD-10-CM

## 2015-11-25 DIAGNOSIS — N189 Chronic kidney disease, unspecified: Secondary | ICD-10-CM

## 2015-11-25 LAB — CBC WITH DIFFERENTIAL/PLATELET
BASOS ABS: 0 10*3/uL (ref 0–0.1)
Basophils Relative: 0 %
EOS PCT: 0 %
Eosinophils Absolute: 0 10*3/uL (ref 0–0.7)
HEMATOCRIT: 25.3 % — AB (ref 40.0–52.0)
Hemoglobin: 8.5 g/dL — ABNORMAL LOW (ref 13.0–18.0)
LYMPHS ABS: 0.6 10*3/uL — AB (ref 1.0–3.6)
LYMPHS PCT: 4 %
MCH: 30.3 pg (ref 26.0–34.0)
MCHC: 33.7 g/dL (ref 32.0–36.0)
MCV: 89.9 fL (ref 80.0–100.0)
MONO ABS: 1.4 10*3/uL — AB (ref 0.2–1.0)
Monocytes Relative: 9 %
NEUTROS ABS: 14 10*3/uL — AB (ref 1.4–6.5)
Neutrophils Relative %: 87 %
Platelets: 174 10*3/uL (ref 150–440)
RBC: 2.81 MIL/uL — AB (ref 4.40–5.90)
RDW: 17.6 % — ABNORMAL HIGH (ref 11.5–14.5)
WBC: 16 10*3/uL — AB (ref 3.8–10.6)

## 2015-11-25 LAB — BASIC METABOLIC PANEL
ANION GAP: 6 (ref 5–15)
BUN: 53 mg/dL — AB (ref 6–20)
CO2: 23 mmol/L (ref 22–32)
Calcium: 8.2 mg/dL — ABNORMAL LOW (ref 8.9–10.3)
Chloride: 116 mmol/L — ABNORMAL HIGH (ref 101–111)
Creatinine, Ser: 1.54 mg/dL — ABNORMAL HIGH (ref 0.61–1.24)
GFR calc Af Amer: 54 mL/min — ABNORMAL LOW (ref >60)
GFR calc non Af Amer: 46 mL/min — ABNORMAL LOW (ref >60)
GLUCOSE: 99 mg/dL (ref 65–99)
POTASSIUM: 4 mmol/L (ref 3.5–5.1)
Sodium: 145 mmol/L (ref 135–145)

## 2015-11-25 LAB — URINE CULTURE

## 2015-11-25 LAB — TYPE AND SCREEN
ABO/RH(D): A POS
Antibody Screen: NEGATIVE
UNIT DIVISION: 0
UNIT DIVISION: 0
Unit division: 0

## 2015-11-25 MED ORDER — HYDROCOD POLST-CPM POLST ER 10-8 MG/5ML PO SUER
5.0000 mL | Freq: Two times a day (BID) | ORAL | Status: DC
  Administered 2015-11-25 – 2015-11-30 (×11): 5 mL
  Filled 2015-11-25 (×11): qty 5

## 2015-11-25 MED ORDER — FREE WATER
200.0000 mL | Freq: Three times a day (TID) | Status: DC
  Administered 2015-11-25 – 2015-11-30 (×16): 200 mL

## 2015-11-25 MED ORDER — SENNOSIDES 8.8 MG/5ML PO SYRP
15.0000 mL | ORAL_SOLUTION | Freq: Two times a day (BID) | ORAL | Status: DC
  Administered 2015-11-25 (×2): 15 mL via ORAL
  Filled 2015-11-25 (×7): qty 15

## 2015-11-25 MED ORDER — OXYCODONE HCL 5 MG/5ML PO SOLN
5.0000 mg | ORAL | Status: DC | PRN
  Administered 2015-11-25 – 2015-11-30 (×23): 5 mg
  Filled 2015-11-25 (×23): qty 5

## 2015-11-25 NOTE — Progress Notes (Signed)
[place] NOTE  Patient Care Team: Adin Hector, MD as PCP - General (Internal Medicine) Clent Jacks, RN as Registered Nurse  CHIEF COMPLAINTS/PURPOSE OF CONSULTATION: Metastatic squamous cell cancer of esophagus  HISTORY OF PRESENTING ILLNESS:  Jeffrey [name] 65 y.o.  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  Patient noted to have increasing cough or shortness of breath without any fevers. Chest x-ray was ordered that showed right upper lobe consolidation/cavitary mass- directed to the emergency room. Patient is currently on antibiotics vancomycin and Zosyn. He also received fluid boluses for borderline low blood pressures. Status post 2 units of PRBC transfusion for hemoglobin 6.8.  Currently he is feeling improved. No fevers. Continues to cough. Continues to complain of pleuritic chest pain. Poor appetite. No nausea no vomiting.   ROS: A complete 10 point review of system is done which is negative except mentioned above in history of present illness  MEDICAL HISTORY:  Past Medical History:  Diagnosis Date  . Bladder cancer [facility])    s/p surgery and reconstruction  . Chronic kidney disease    stage 3  . Emphysema of lung ([place])   . Esophageal cancer ([place])   . Hypertension     SURGICAL HISTORY: Past Surgical History:  Procedure Laterality Date  . BLADDER REMOVAL    . ESOPHAGOGASTRODUODENOSCOPY N/A 05/22/2015   Procedure: ESOPHAGOGASTRODUODENOSCOPY (EGD);  Surgeon: Manya Silvas, MD;  Location: [facility] ENDOSCOPY;  Service: Endoscopy;  Laterality: N/A;  . ESOPHAGOGASTRODUODENOSCOPY (EGD) WITH PROPOFOL N/A 01/21/2015   Procedure: ESOPHAGOGASTRODUODENOSCOPY (EGD) WITH PROPOFOL-looking in the esophagus stomach and upper small intestine to evaluate and treat;  Surgeon: [name], MD;  Location: [facility] ENDOSCOPY;  Service:  Endoscopy;  Laterality: N/A;  . GASTROSTOMY W/ FEEDING TUBE Left    placed end of october 2016  . LYMPH NODE BIOPSY N/A 02/04/2015   Procedure: LYMPH NODE BIOPSY;  Surgeon: [name], MD;  Location: [facility] ORS;  Service: General;  Laterality: N/A;  . PORTACATH PLACEMENT Right 02/04/2015   Procedure: INSERTION PORT-A-CATH;  Surgeon: [name], MD;  Location: [facility] ORS;  Service: General;  Laterality: Right;    SOCIAL HISTORY: Social History   Social History  . Marital status: Married    Spouse name: N/A  . Number of children: N/A  . Years of education: N/A   Occupational History  . Not on file.   Social History Main Topics  . Smoking status: Current Every Day Smoker    Packs/day: 0.50    Years: 47.00    Types: Cigarettes  . Smokeless tobacco: Current User    Types: Chew  . Alcohol use No     Comment: quit drinking 3 months ago  . Drug use: No  . Sexual activity: Not on file   Other Topics Concern  . Not on file   Social History Narrative   Lives at home with wife. Independent at baseline.    FAMILY HISTORY: Family History  Problem Relation Age of Onset  . CAD Father   . Breast cancer Mother     ALLERGIES:  is allergic to aspirin and taxol [paclitaxel].  MEDICATIONS:  Current Facility-Administered Medications  Medication Dose Route Frequency Provider Last Rate Last Dose  . acetaminophen (TYLENOL) tablet 650 mg  650 mg Oral Q6H PRN Lytle Butte, MD       Or  .  acetaminophen (TYLENOL) suppository 650 mg  650 mg Rectal Q6H PRN Lytle Butte, MD      . antiseptic oral rinse (CPC / CETYLPYRIDINIUM CHLORIDE 0.05%) solution 7 mL  7 mL Mouth Rinse q12n4p Alexis Hugelmeyer, DO   7 mL at 11/25/15 1451  . chlorpheniramine-HYDROcodone (TUSSIONEX) 10-8 MG/5ML suspension 5 mL  5 mL Per Tube Q12H Srikar Sudini, MD   5 mL at 11/25/15 1449  . feeding supplement (JEVITY 1.5 CAL/FIBER) liquid 237 mL  237 mL Per Tube 6 X Daily Srikar Sudini, MD   237 mL at 11/25/15 1710  . free  water 200 mL  200 mL Per Tube Q8H Srikar Sudini, MD   200 mL at 11/25/15 1449  . heparin injection 5,000 Units  5,000 Units Subcutaneous Q8H Lytle Butte, MD   5,000 Units at 11/25/15 1449  . ipratropium-albuterol (DUONEB) 0.5-2.5 (3) MG/3ML nebulizer solution 3 mL  3 mL Nebulization Q4H PRN Lytle Butte, MD      . ondansetron [facility]) tablet 4 mg  4 mg Oral Q6H PRN Lytle Butte, MD       Or  . ondansetron [facility]) injection 4 mg  4 mg Intravenous Q6H PRN Lytle Butte, MD      . oxyCODONE (ROXICODONE) 5 MG/5ML solution 5 mg  5 mg Per Tube Q4H PRN Cammie Sickle, MD   5 mg at 11/25/15 1710  . piperacillin-tazobactam (ZOSYN) IVPB 3.375 g  3.375 g Intravenous Q8H Lytle Butte, MD   3.375 g at 11/25/15 0803  . sennosides (SENOKOT) 8.8 MG/5ML syrup 15 mL  15 mL Oral BID Hillary Bow, MD   15 mL at 11/25/15 1449  . TRIPLE ANTIBIOTIC [id number] OINT 1 application  1 application Topical Daily Lytle Butte, MD   1 application at [id number]   Facility-Administered Medications Ordered in Other Encounters  Medication Dose Route Frequency Provider Last Rate Last Dose  . sodium chloride 0.9 % injection 10 mL  10 mL Intracatheter PRN Cammie Sickle, MD   10 mL at 03/04/15 0933      .  PHYSICAL EXAMINATION:  Vitals:   11/25/15 0509 11/25/15 1515  BP: 106/64 109/74  Pulse: 94 (!) 105  Resp: 18   Temp: 98 F (36.7 C) 98.8 F (37.1 C)   Filed Weights   11/23/15 1620 11/23/15 1655 11/24/15 0303  Weight: 107 lb 6.4 oz (48.7 kg) 115 lb 14.4 oz (52.6 kg) 113 lb (51.3 kg)    GENERAL: Thin build cachectic-appearing Afro-American male patient Alert, no distress and comfortable.  Alone. EYES: no pallor or icterus OROPHARYNX: no thrush or ulceration. NECK: supple, no masses felt LYMPH:  no palpable lymphadenopathy in the cervical, axillary or inguinal regions LUNGS: decreased breath sounds  right side compared to left and  No wheeze or crackles HEART/CVS: regular rate & rhythm  and no murmurs; No lower extremity edema ABDOMEN: abdomen soft, non-tender and normal bowel sounds; positive for PEG tube Musculoskeletal:no cyanosis of digits and no clubbing  PSYCH: alert & oriented x 3 with fluent speech NEURO: no focal motor/sensory deficits SKIN:  no rashes or significant lesions  LABORATORY DATA:  I have reviewed the data as listed Lab Results  Component Value Date   WBC 16.0 (H) 11/25/2015   HGB 8.5 (L) 11/25/2015   HCT 25.3 (L) 11/25/2015   MCV 89.9 11/25/2015   PLT 174 11/25/2015    Recent Labs  11/02/15 1142  11/23/15 1044 11/23/15 1640 11/24/15  [id number] 11/25/15 0523  NA 132*  < > 133* 137 142 145  K 4.7  < > 4.6 4.7 4.3 4.0  CL 96*  < > 97* 102 111 116*  CO2 25  < > 24 22 21* 23  GLUCOSE 94  < > 135* 111* 95 99  BUN 41*  < > 96* 92* 76* 53*  CREATININE 1.59*  < > 2.17* 2.15* 1.73* 1.54*  CALCIUM 8.8*  < > 8.9 8.9 8.3* 8.2*  GFRNONAA 45*  < > 31* 31* 40* 46*  GFRAA 52*  < > 35* 36* 47* 54*  PROT 7.8  --  8.2* 7.6  --   --   ALBUMIN 3.0*  --  2.5* 2.4*  --   --   AST 16  --  17 16  --   --   ALT 9*  --  10* 7*  --   --   ALKPHOS 53  --  74 70  --   --   BILITOT 0.3  --  0.7 0.7  --   --   < > = values in this interval not displayed.  RADIOGRAPHIC STUDIES: I have personally reviewed the radiological images as listed and agreed with the findings in the report. Dg Chest 2 View  Result Date: 11/23/2015 CLINICAL DATA:  Esophageal cancer.  Cough, dehydration. EXAM: CHEST  2 VIEW COMPARISON:  Chest x-ray dated 10/30/2015. FINDINGS: New cavitary mass/consolidation within the suprahilar right upper lobe, measuring approximately 6.6 x 5.8 cm. Additional mild perihilar edema, right slightly greater than left. No pleural effusion or pneumothorax seen. Heart size is normal. Right chest wall Port-A-Cath stable in position with tip at the level of the mid SVC. Osseous structures about the chest are unremarkable. Gastrostomy tube in place. IMPRESSION: 1. New  cavitary mass/consolidation within the right upper lobe, suprahilar, measuring approximately 6.6 x 5.8 cm. This could represent cavitary pneumonia or cavitary metastasis. Favor cavitary pneumonia. 2. Mild interstitial edema.  No pleural effusion. 3. Heart size is normal. These results were called by telephone at the time of interpretation on 11/23/2015 at 3:44 pm to Dr. Charlaine Dalton , who verbally acknowledged these results. Electronically Signed   By: Franki Cabot M.D.   On: 11/23/2015 15:45   Dg Chest 2 View  Result Date: 10/30/2015 CLINICAL DATA:  Cough.  History of esophageal carcinoma EXAM: CHEST  2 VIEW COMPARISON:  Chest radiograph February 04, 2015; PET-CT September 25, 2015 FINDINGS: Port-A-Cath tip is in the superior vena cava. No pneumothorax. There is no edema or consolidation. Heart size and pulmonary vascularity within normal limits. No adenopathy evident. No bone lesions. There is calcification in the left carotid artery. IMPRESSION: Port-A-Cath tip in superior vena cava. No pneumothorax. No edema or consolidation. No adenopathy evident by radiography. The known esophageal mass is not appreciable by radiography. Small focus of calcification left carotid artery. Electronically Signed   By: Lowella Grip III M.D.   On: 10/30/2015 10:46  Ct Chest Wo Contrast  Result Date: 11/24/2015 CLINICAL DATA:  Right middle lobe mass. History of esophageal cancer. EXAM: CT CHEST WITHOUT CONTRAST TECHNIQUE: Multidetector CT imaging of the chest was performed following the standard protocol without IV contrast. COMPARISON:  Chest radiograph 11/23/2015, PET-CT 09/25/2015 FINDINGS: Cardiovascular: Right internal jugular approach injectable port terminates in the distal superior vena cava. The heart is normal in size. There is small pericardial effusion, measuring up to 11 mm at the base of the heart. Calcific atherosclerotic disease of  the coronary arteries noted. The thoracic aorta is torturous. There is  irregular thickening of the distal esophagus, consistent with the given history of esophageal cancer. The upper esophagus upstream of the esophageal mass is dilated. Small amount of frothy material, likely esophageal secretions, is seen in the dependent portion of the upper esophagus. Mediastinum/Nodes: There is probably necrotic subcarinal lymphadenopathy, inseparable from the esophageal mass. Lungs/Pleura: Main bronchi are patent. There is a large 9.3 by 7.2 by 10.0 cm soft tissue mass versus area of dense airspace consolidation containing central cavitation filled with frothy material in the right hemithorax. This mass is centered in the posterior segment of the right upper lobe, but involves a significant portion of the superior segment of the right lower lobe as well and is not restricted by the interlobar fissure. Patent bronchi are seen transversing this mass medially. This abnormality is inseparable from the esophageal malignancy. Patchy areas of airspace consolidation are seen in the basilar segments of the right lower lobe, right middle lobe, and lingula, with surrounding larger areas of ground-glass opacity. Nodular and linear mass versus scarring is seen in the posterior segment of the left upper lobe. Upper Abdomen: Peg tube is in place. Calcified splenic granulomas are noted. Musculoskeletal: No chest wall mass or suspicious bone lesions identified. IMPRESSION: Large cavitary mass versus areas of cavitary pneumonia in the posterior right hemithorax, involving the posterior segment of right upper lobe and superior segment of right lower lobe. This mass abuts the known esophageal malignancy and direct communication cannot be excluded. Areas of patchy airspace consolidation in bilateral lungs may represent multifocal pneumonia, with aspiration pneumonia being a consideration. Lymphangitic spread of malignancy although possible is favored less likely. Left upper lobe nodule and linear opacity, likely  representing scarring, given no significant increased radiotracer activity was noted in this area of the lung on prior PET-CT. Likely necrotic subcarinal lymphadenopathy. Small pericardial effusion. Electronically Signed   By: Fidela Salisbury M.D.   On: 11/24/2015 16:03   Dg Perc Gastrostomy Tube Insert W/fluoro  Result Date: 11/12/2015 CLINICAL DATA:  Replacement of gastrostomy tube. EXAM: PERCUTANEOUS GASTROSTOMY TUBE REMOVAL MEDICATIONS: None. ANESTHESIA/SEDATION: Versed 2.0 mg IV; Fentanyl 100 mcg IV Moderate Sedation Time:  20 The patient was continuously monitored during the procedure by the interventional radiology nurse under my direct supervision. CONTRAST:  10 mL - administered into the gastric lumen. FLUOROSCOPY TIME:  Fluoroscopy Time: 0 minutes 54 seconds (14.10 mGy). COMPLICATIONS: None immediate. PROCEDURE: The risk and benefits of this procedure were discussed with the patient and informed consent was obtained. The patient was placed supine on fluoroscopic table. Left upper quadrant and previous gastrostomy tract were prepped and draped using sterile technique. Under fluoroscopic guidance, directional catheter and guidewire was passed through pre-existing gastrostomy track and directed into gastric lumen. Contrast was injected to confirm intraluminal position. Using 16 and 18 French dilators, gastrostomy tract was dilated over the guidewire. Then, 98 French gastrostomy catheter was placed and positioned under fluoroscopy. Contrast is injected to confirm its intraluminal position. The internal balloon was inflated. External portion of the catheter was secured. IMPRESSION: Under fluoroscopic guidance, successful replacement of 18 French gastrostomy catheter through pre-existing gastrostomy site. Electronically Signed   By: Marijo Conception, M.D.   On: 11/12/2015 13:40    ASSESSMENT & PLAN:   # 65 yo with metastatic squamous cell esophageal cancer admitted to hospital for cough/increasing  dyspnea-  # RUL cavitary mass ~10cm- highly suggestive for infectious etiology/pneumonia- less likely malignancy. Mild clinical  improvement noted on antibiotics. I suspect patient will need antibiotics for few weeks.   # Severe anemia- multifactorial- CKD/maliganancy/ chemotherapy- less likley acute GI bleed- s/p PRBC transfusion.  # Metastatic squamous cell ca- on abraxane s/p 2 cycles- clinically stable disease [left neck LN]. Discussed with the patient that chemotherapy will be on hold while he is being treated for active pneumonia.  # Right sided pleuritic chest pain- recommend increasing the frequency of his oxycodone liquid to the PEG tube.   # Also spoke to patient's wife over the phone. I reviewed the images mysell; and with the patient.  All questions were answered. The patient knows to call the clinic with any problems, questions or concerns.  Thank you Dr. Darvin Neighbours for allowing me to participate in the care of your pleasant patient. Please do not hesitate to contact me with questions or concerns in the interim.    Cammie Sickle, MD 11/25/2015 5:55 PM

## 2015-11-25 NOTE — Progress Notes (Signed)
[place] [facility] [place] NAME: Jeffrey [name]    MR#:  [id number]  DATE OF BIRTH:  1950-06-04  SUBJECTIVE:  CHIEF COMPLAINT:   Chief Complaint  Patient presents with  . general sickness   Clear sputum today.  SOB better.  Afebrile  REVIEW OF SYSTEMS:    Review of Systems  Constitutional: Positive for malaise/fatigue and weight loss. Negative for chills and fever.  HENT: Negative for sore throat.   Eyes: Negative for blurred vision, double vision and pain.  Respiratory: Positive for cough, sputum production and shortness of breath. Negative for hemoptysis and wheezing.   Cardiovascular: Positive for chest pain. Negative for palpitations, orthopnea and leg swelling.  Gastrointestinal: Negative for abdominal pain, constipation, diarrhea, heartburn, nausea and vomiting.  Genitourinary: Negative for dysuria and hematuria.  Musculoskeletal: Negative for back pain and joint pain.  Skin: Negative for rash.  Neurological: Positive for weakness. Negative for sensory change, speech change, focal weakness and headaches.  Endo/Heme/Allergies: Does not bruise/bleed easily.  Psychiatric/Behavioral: Negative for depression. The patient is not nervous/anxious.     DRUG ALLERGIES:   Allergies  Allergen Reactions  . Aspirin Nausea Only  . Taxol [Paclitaxel] Other (See Comments)    Diaphoretic, flushing and tightness in chest    VITALS:  Blood pressure 106/64, pulse 94, temperature 98 F (36.7 C), temperature source Oral, resp. rate 18, height 5\' 7"  (1.702 m), weight 51.3 kg (113 lb), SpO2 100 %.  PHYSICAL EXAMINATION:   Physical Exam  GENERAL:  65 y.o.-year-old patient lying in the bed with no acute distress.  EYES: Pupils equal, round, reactive to light and accommodation. No scleral icterus. Extraocular muscles intact.  HEENT: Head atraumatic, normocephalic. Oropharynx and nasopharynx clear.  NECK:  Supple, no jugular venous  distention. No thyroid enlargement, no tenderness.  LUNGS: Normal breath sounds bilaterally, no wheezing, rales, rhonchi. No use of accessory muscles of respiration.  CARDIOVASCULAR: S1, S2 normal. No murmurs, rubs, or gallops.  ABDOMEN: Soft, nontender, nondistended. Bowel sounds present. No organomegaly or mass.  EXTREMITIES: No cyanosis, clubbing or edema b/l.    NEUROLOGIC: Cranial nerves II through XII are intact. No focal Motor or sensory deficits b/l.   PSYCHIATRIC: The patient is alert and oriented x 3.  SKIN: No obvious rash, lesion, or ulcer.   LABORATORY PANEL:   CBC  Recent Labs Lab 11/25/15 0523  WBC 16.0*  HGB 8.5*  HCT 25.3*  PLT 174   ------------------------------------------------------------------------------------------------------------------ Chemistries   Recent Labs Lab 11/23/15 1640  11/25/15 0523  NA 137  < > 145  K 4.7  < > 4.0  CL 102  < > 116*  CO2 22  < > 23  GLUCOSE 111*  < > 99  BUN 92*  < > 53*  CREATININE 2.15*  < > 1.54*  CALCIUM 8.9  < > 8.2*  AST 16  --   --   ALT 7*  --   --   ALKPHOS 70  --   --   BILITOT 0.7  --   --   < > = values in this interval not displayed. ------------------------------------------------------------------------------------------------------------------  Cardiac Enzymes  Recent Labs Lab 11/23/15 1640  TROPONINI 0.03*   ------------------------------------------------------------------------------------------------------------------  RADIOLOGY:  Dg Chest 2 View  Result Date: 11/23/2015 CLINICAL DATA:  Esophageal cancer.  Cough, dehydration. EXAM: CHEST  2 VIEW COMPARISON:  Chest x-ray dated 10/30/2015. FINDINGS: New cavitary mass/consolidation within the suprahilar right upper lobe, measuring approximately 6.6 x  5.8 cm. Additional mild perihilar edema, right slightly greater than left. No pleural effusion or pneumothorax seen. Heart size is normal. Right chest wall Port-A-Cath stable in position with  tip at the level of the mid SVC. Osseous structures about the chest are unremarkable. Gastrostomy tube in place. IMPRESSION: 1. New cavitary mass/consolidation within the right upper lobe, suprahilar, measuring approximately 6.6 x 5.8 cm. This could represent cavitary pneumonia or cavitary metastasis. Favor cavitary pneumonia. 2. Mild interstitial edema.  No pleural effusion. 3. Heart size is normal. These results were called by telephone at the time of interpretation on 11/23/2015 at 3:44 pm to Dr. Charlaine Dalton , who verbally acknowledged these results. Electronically Signed   By: Franki Cabot M.D.   On: 11/23/2015 15:45   Ct Chest Wo Contrast  Result Date: 11/24/2015 CLINICAL DATA:  Right middle lobe mass. History of esophageal cancer. EXAM: CT CHEST WITHOUT CONTRAST TECHNIQUE: Multidetector CT imaging of the chest was performed following the standard protocol without IV contrast. COMPARISON:  Chest radiograph 11/23/2015, PET-CT 09/25/2015 FINDINGS: Cardiovascular: Right internal jugular approach injectable port terminates in the distal superior vena cava. The heart is normal in size. There is small pericardial effusion, measuring up to 11 mm at the base of the heart. Calcific atherosclerotic disease of the coronary arteries noted. The thoracic aorta is torturous. There is irregular thickening of the distal esophagus, consistent with the given history of esophageal cancer. The upper esophagus upstream of the esophageal mass is dilated. Small amount of frothy material, likely esophageal secretions, is seen in the dependent portion of the upper esophagus. Mediastinum/Nodes: There is probably necrotic subcarinal lymphadenopathy, inseparable from the esophageal mass. Lungs/Pleura: Main bronchi are patent. There is a large 9.3 by 7.2 by 10.0 cm soft tissue mass versus area of dense airspace consolidation containing central cavitation filled with frothy material in the right hemithorax. This mass is centered  in the posterior segment of the right upper lobe, but involves a significant portion of the superior segment of the right lower lobe as well and is not restricted by the interlobar fissure. Patent bronchi are seen transversing this mass medially. This abnormality is inseparable from the esophageal malignancy. Patchy areas of airspace consolidation are seen in the basilar segments of the right lower lobe, right middle lobe, and lingula, with surrounding larger areas of ground-glass opacity. Nodular and linear mass versus scarring is seen in the posterior segment of the left upper lobe. Upper Abdomen: Peg tube is in place. Calcified splenic granulomas are noted. Musculoskeletal: No chest wall mass or suspicious bone lesions identified. IMPRESSION: Large cavitary mass versus areas of cavitary pneumonia in the posterior right hemithorax, involving the posterior segment of right upper lobe and superior segment of right lower lobe. This mass abuts the known esophageal malignancy and direct communication cannot be excluded. Areas of patchy airspace consolidation in bilateral lungs may represent multifocal pneumonia, with aspiration pneumonia being a consideration. Lymphangitic spread of malignancy although possible is favored less likely. Left upper lobe nodule and linear opacity, likely representing scarring, given no significant increased radiotracer activity was noted in this area of the lung on prior PET-CT. Likely necrotic subcarinal lymphadenopathy. Small pericardial effusion. Electronically Signed   By: Fidela Salisbury M.D.   On: 11/24/2015 16:03     ASSESSMENT AND PLAN:   [name] history of esophageal cancer on active chemotherapy presenting with cough  * Right upper lobe cavitary lesion. Abscess or tumor extension Sepsis present on admission. With fever and leucocytosis  will continue IV antibiotics. MRSA PCR negative and will discontinue vancomycin Sputum culture sent  and pending Discussed with Dr. Juanell Fairly of pulmonary.  4 weeks of abx Likely d/c in AM on Augmentin  * Sepsis Resolved  * Acutely worsening chronic anemia - could be due to chemotherapy Received 2 units PRBC Gastric aspirate negative for occult blood  * AKI over CKD3 Resolved. Stop IVF  * Malnutrition:  Continue jevity Added free water  * Essential hypertension Stopped Norvasc due to hypotension  * Esophageal cancer: Oncology on board  * DVT prophylaxis SCDs due to anemia  All the records are reviewed and case discussed with Care Management/Social Workerr. Management plans discussed with the patient, family and they are in agreement.  CODE STATUS: FULL CODE  DVT Prophylaxis: SCDs  TOTAL CC TIME TAKING CARE OF THIS PATIENT: 35 minutes.   POSSIBLE D/C IN 2-3 DAYS, DEPENDING ON CLINICAL CONDITION.  Hillary Bow R M.D on 11/25/2015 at 1:59 PM  Between 7am to 6pm - Pager - [phone]  After 6pm go to [email or web address] - password [email or web address] [place]  Office  [phone]  CC: Primary care physician; Adin Hector, MD  Note: This dictation was prepared with Dragon dictation along with smaller phrase technology. Any transcriptional errors that result from this process are unintentional.

## 2015-11-25 NOTE — Consult Note (Signed)
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 -Continue antibiotics. -Likely require 4-6 weeks of Augmentin 875 by mouth twice a day and possibly longer. -Patient will need to remain nothing by mouth. -Outpatient follow-up in 4 weeks to perform a chest x-ray and evaluate progression.  Subcarinal and right hilar lymphadenopathy, likely secondary to esophageal cancer. -Review of patient's most recent CT films that. In comparison with previous PET scans showed that the subcarinal lymphadenopathy appears to have progressed since the previous films, with possible extension into the right hilar area.  Acute respiratory failure. -Secondary to above, continue antibiotics, advance activity.  Esophageal cancer. -Patient's aspiration pneumonia is likely secondary to esophageal dysmotility with esophageal obstruction. Continue management as per oncology. A palliative care consult may be helpful in regards to managing his symptoms outpatient.  Date: 11/25/2015  MRN# [id number] Jeffrey [name] 09-Jun-1950  Referring Physician: Dr [name] is a 65 y.o. old male seen in consultation for chief complaint of:    Chief Complaint  Patient presents with  . general sickness    HPI:   Patient is a 65 year old male, he is very quiet and does not provide much in way of history, therefore, much of the history was obtained from the chart and from staff. He has a history of esophageal cancer, small cell, stage IV, diagnosed in October 2016. He completed an initial chemotherapy and radiation in November 2016. He had additional local radiation to the neck due to increase in lymphadenopathy seen on PET scanning, completed earlier this year. Over the last year. His esophageal cancer appears to have progressed, he was started on additional rounds of chemotherapy but  developed infusion reaction, subsequently he is had considerable dysphagia and has had a PEG tube placed.  He continues to note that he has continued dysphagia, history of trouble swallowing water, will though sometimes drink and is able to get it down, other times not. He was seen by his oncologist on 8/21. At that time, he had a complaint of worsening cough and sputum for several days. Subsequently, chemotherapy was put on hold. He was started on Augmentin via PEG tube, and sent for chest x-ray.  Images reviewed, this showed a large cavitary mass in the right lung, subsequently the patient was sent for CT of the chest, this showed a large cavitary pneumonia in the right upper lobe extending to the right lower lobe, there is also evidence of pneumonia in the right middle lobe and the lingula. There is large/matted lymphadenopathy and subcarinal and right hilar areas, this is contiguous with a esophageal mass/cancer.   PMHX:   Past Medical History:  Diagnosis Date  . Bladder cancer [facility])    s/p surgery and reconstruction  . Chronic kidney disease    stage 3  . Emphysema of lung ([place])   . Esophageal cancer ([place])   . Hypertension    Surgical Hx:  Past Surgical History:  Procedure Laterality Date  . BLADDER REMOVAL    . ESOPHAGOGASTRODUODENOSCOPY N/A 05/22/2015   Procedure: ESOPHAGOGASTRODUODENOSCOPY (EGD);  Surgeon: Manya Silvas, MD;  Location: [facility] ENDOSCOPY;  Service: Endoscopy;  Laterality: N/A;  . ESOPHAGOGASTRODUODENOSCOPY (EGD) WITH PROPOFOL N/A 01/21/2015   Procedure: ESOPHAGOGASTRODUODENOSCOPY (EGD) WITH PROPOFOL-looking in the esophagus stomach and upper small intestine to evaluate and treat;  Surgeon: [name], MD;  Location: [facility] ENDOSCOPY;  Service: Endoscopy;  Laterality: N/A;  .  GASTROSTOMY W/ FEEDING TUBE Left    placed end of october 2016  . LYMPH NODE BIOPSY N/A 02/04/2015   Procedure: LYMPH NODE BIOPSY;  Surgeon: [name], MD;  Location: [facility] ORS;  Service:  General;  Laterality: N/A;  . PORTACATH PLACEMENT Right 02/04/2015   Procedure: INSERTION PORT-A-CATH;  Surgeon: [name], MD;  Location: [facility] ORS;  Service: General;  Laterality: Right;   Family Hx:  Family History  Problem Relation Age of Onset  . CAD Father   . Breast cancer Mother    Social Hx:   Social History  Substance Use Topics  . Smoking status: Current Every Day Smoker    Packs/day: 0.50    Years: 47.00    Types: Cigarettes  . Smokeless tobacco: Current User    Types: Chew  . Alcohol use No     Comment: quit drinking 3 months ago   Medication:    Reviewed   Allergies:  Aspirin and Taxol [paclitaxel]  Review of Systems: Gen:  Denies  fever, sweats, chills HEENT: Denies blurred vision Cvc:  No dizziness, chest pain. Resp:   Denies cough or sputum production. Gi: Denies swallowing difficulty, stomach pain. Gu:  Denies bladder incontinence, burning urine Ext:   No Joint pain, stiffness. Skin: No skin rash,  hives  Endoc:  No polyuria, polydipsia. Psych: No depression, insomnia. Other:  All other systems were reviewed with the patient and were negative other that what is mentioned in the HPI.   Physical Examination:   VS: BP 106/64 (BP Location: Left Arm)   Pulse 94   Temp 98 F (36.7 C) (Oral)   Resp 18   Ht 5\' 7"  (1.702 m)   Wt 113 lb (51.3 kg)   SpO2 100%   BMI 17.70 kg/m   General Appearance: No distress  Neuro:without focal findings,  speech normal,  HEENT: PERRLA, EOM intact.   Pulmonary: normal breath sounds, No wheezing.  CardiovascularNormal S1,S2.  No m/r/g.   Abdomen: Benign, Soft, non-tender. Renal:  No costovertebral tenderness  GU:  No performed at this time. Endoc: No evident thyromegaly, no signs of acromegaly. Skin:   warm, no rashes, no ecchymosis  Extremities: normal, no cyanosis, clubbing.  Other findings:    LABORATORY PANEL:   CBC  Recent Labs Lab 11/25/15 0523  WBC 16.0*  HGB 8.5*  HCT 25.3*  PLT 174    ------------------------------------------------------------------------------------------------------------------  Chemistries   Recent Labs Lab 11/23/15 1640  11/25/15 0523  NA 137  < > 145  K 4.7  < > 4.0  CL 102  < > 116*  CO2 22  < > 23  GLUCOSE 111*  < > 99  BUN 92*  < > 53*  CREATININE 2.15*  < > 1.54*  CALCIUM 8.9  < > 8.2*  AST 16  --   --   ALT 7*  --   --   ALKPHOS 70  --   --   BILITOT 0.7  --   --   < > = values in this interval not displayed. ------------------------------------------------------------------------------------------------------------------  Cardiac Enzymes  Recent Labs Lab 11/23/15 1640  TROPONINI 0.03*   ------------------------------------------------------------  RADIOLOGY:  Dg Chest 2 View  Result Date: 11/23/2015 CLINICAL DATA:  Esophageal cancer.  Cough, dehydration. EXAM: CHEST  2 VIEW COMPARISON:  Chest x-ray dated 10/30/2015. FINDINGS: New cavitary mass/consolidation within the suprahilar right upper lobe, measuring approximately 6.6 x 5.8 cm. Additional mild perihilar edema, right slightly greater than left. No pleural effusion  or pneumothorax seen. Heart size is normal. Right chest wall Port-A-Cath stable in position with tip at the level of the mid SVC. Osseous structures about the chest are unremarkable. Gastrostomy tube in place. IMPRESSION: 1. New cavitary mass/consolidation within the right upper lobe, suprahilar, measuring approximately 6.6 x 5.8 cm. This could represent cavitary pneumonia or cavitary metastasis. Favor cavitary pneumonia. 2. Mild interstitial edema.  No pleural effusion. 3. Heart size is normal. These results were called by telephone at the time of interpretation on 11/23/2015 at 3:44 pm to Dr. Charlaine Dalton , who verbally acknowledged these results. Electronically Signed   By: Franki Cabot M.D.   On: 11/23/2015 15:45   Ct Chest Wo Contrast  Result Date: 11/24/2015 CLINICAL DATA:  Right middle lobe mass.  History of esophageal cancer. EXAM: CT CHEST WITHOUT CONTRAST TECHNIQUE: Multidetector CT imaging of the chest was performed following the standard protocol without IV contrast. COMPARISON:  Chest radiograph 11/23/2015, PET-CT 09/25/2015 FINDINGS: Cardiovascular: Right internal jugular approach injectable port terminates in the distal superior vena cava. The heart is normal in size. There is small pericardial effusion, measuring up to 11 mm at the base of the heart. Calcific atherosclerotic disease of the coronary arteries noted. The thoracic aorta is torturous. There is irregular thickening of the distal esophagus, consistent with the given history of esophageal cancer. The upper esophagus upstream of the esophageal mass is dilated. Small amount of frothy material, likely esophageal secretions, is seen in the dependent portion of the upper esophagus. Mediastinum/Nodes: There is probably necrotic subcarinal lymphadenopathy, inseparable from the esophageal mass. Lungs/Pleura: Main bronchi are patent. There is a large 9.3 by 7.2 by 10.0 cm soft tissue mass versus area of dense airspace consolidation containing central cavitation filled with frothy material in the right hemithorax. This mass is centered in the posterior segment of the right upper lobe, but involves a significant portion of the superior segment of the right lower lobe as well and is not restricted by the interlobar fissure. Patent bronchi are seen transversing this mass medially. This abnormality is inseparable from the esophageal malignancy. Patchy areas of airspace consolidation are seen in the basilar segments of the right lower lobe, right middle lobe, and lingula, with surrounding larger areas of ground-glass opacity. Nodular and linear mass versus scarring is seen in the posterior segment of the left upper lobe. Upper Abdomen: Peg tube is in place. Calcified splenic granulomas are noted. Musculoskeletal: No chest wall mass or suspicious bone  lesions identified. IMPRESSION: Large cavitary mass versus areas of cavitary pneumonia in the posterior right hemithorax, involving the posterior segment of right upper lobe and superior segment of right lower lobe. This mass abuts the known esophageal malignancy and direct communication cannot be excluded. Areas of patchy airspace consolidation in bilateral lungs may represent multifocal pneumonia, with aspiration pneumonia being a consideration. Lymphangitic spread of malignancy although possible is favored less likely. Left upper lobe nodule and linear opacity, likely representing scarring, given no significant increased radiotracer activity was noted in this area of the lung on prior PET-CT. Likely necrotic subcarinal lymphadenopathy. Small pericardial effusion. Electronically Signed   By: Fidela Salisbury M.D.   On: 11/24/2015 16:03       Thank  you for the consultation and for allowing [facility] to assist in the care of your patient. Our recommendations are noted above.  Please contact us if we can be of further service.   Marda Stalker, MD.  Board Certified in Internal Medicine,  Pulmonary Medicine, Critical Care Medicine, and Sleep Medicine.  [facility] Office Number: [phone]  Patricia Pesa, M.D.  Vilinda Boehringer, M.D.  Merton Border, M.D  11/25/2015

## 2015-11-25 NOTE — Progress Notes (Signed)
Nutrition Follow-up  DOCUMENTATION CODES:   Severe malnutrition in context of chronic illness  INTERVENTION:  -Continue jevity 1.5, 6 cans per day with water flush of 167ml before and after each feeding (order modified). Noted MD ordered additional free water flush of 227ml q 8 hr as well.  Pt tolerating at this time.  Continue to monitor labs and feeding tolerance and make adjustments accordingly.   NUTRITION DIAGNOSIS:   Malnutrition related to cancer and cancer related treatments as evidenced by severe depletion of body fat, severe depletion of muscle mass.    GOAL:   Patient will meet greater than or equal to 90% of their needs  Improving as taking all of jevity at this time  MONITOR:   Labs, Weight trends, TF tolerance  REASON FOR ASSESSMENT:   Consult Enteral/tube feeding initiation and management  ASSESSMENT:     Per MAR pt was able to take 6 cans of jevity 1.5 yesterday, reports tolerated well.  No nausea, vomiting per pt.  No BM reported per pt, thinks last BM was on Sunday 8/20  Medications reviewed: Labs reviewed: BUN 53, creatinine 1.54, Hgb 8.5  Diet Order:  Diet NPO time specified  Skin:  Reviewed, no issues  Last BM:  8/20, reports no diarrhea, BM every day or every other day  Height:   Ht Readings from Last 1 Encounters:  11/24/15 5\' 7"  (1.702 m)    Weight:   Wt Readings from Last 1 Encounters:  11/24/15 113 lb (51.3 kg)    Ideal Body Weight:     BMI:  Body mass index is 17.7 kg/m.  Estimated Nutritional Needs:   Kcal:  [id number] kcals/d  Protein:  61-77 g/d  Fluid:  >/+ 1726ml/d  EDUCATION NEEDS:   No education needs identified at this time  Jabarie Pop B. Zenia Resides, [place], [place] (pager) Weekend/On-Call pager [phone])

## 2015-11-26 ENCOUNTER — Other Ambulatory Visit: Payer: Self-pay | Admitting: *Deleted

## 2015-11-26 DIAGNOSIS — R918 Other nonspecific abnormal finding of lung field: Secondary | ICD-10-CM

## 2015-11-26 DIAGNOSIS — D72829 Elevated white blood cell count, unspecified: Secondary | ICD-10-CM

## 2015-11-26 DIAGNOSIS — C799 Secondary malignant neoplasm of unspecified site: Secondary | ICD-10-CM

## 2015-11-26 DIAGNOSIS — Z79899 Other long term (current) drug therapy: Secondary | ICD-10-CM

## 2015-11-26 DIAGNOSIS — A419 Sepsis, unspecified organism: Principal | ICD-10-CM

## 2015-11-26 DIAGNOSIS — J438 Other emphysema: Secondary | ICD-10-CM

## 2015-11-26 DIAGNOSIS — J189 Pneumonia, unspecified organism: Secondary | ICD-10-CM

## 2015-11-26 DIAGNOSIS — Z803 Family history of malignant neoplasm of breast: Secondary | ICD-10-CM

## 2015-11-26 LAB — CBC WITH DIFFERENTIAL/PLATELET
BASOS ABS: 0 10*3/uL (ref 0–0.1)
Basophils Relative: 0 %
EOS PCT: 0 %
Eosinophils Absolute: 0.1 10*3/uL (ref 0–0.7)
HCT: 25.8 % — ABNORMAL LOW (ref 40.0–52.0)
HEMOGLOBIN: 8.8 g/dL — AB (ref 13.0–18.0)
LYMPHS ABS: 0.5 10*3/uL — AB (ref 1.0–3.6)
LYMPHS PCT: 3 %
MCH: 30.5 pg (ref 26.0–34.0)
MCHC: 33.9 g/dL (ref 32.0–36.0)
MCV: 90.1 fL (ref 80.0–100.0)
Monocytes Absolute: 1.5 10*3/uL — ABNORMAL HIGH (ref 0.2–1.0)
Monocytes Relative: 8 %
NEUTROS ABS: 15.9 10*3/uL — AB (ref 1.4–6.5)
NEUTROS PCT: 89 %
PLATELETS: 172 10*3/uL (ref 150–440)
RBC: 2.87 MIL/uL — AB (ref 4.40–5.90)
RDW: 18.3 % — ABNORMAL HIGH (ref 11.5–14.5)
WBC: 18 10*3/uL — AB (ref 3.8–10.6)

## 2015-11-26 LAB — CULTURE, RESPIRATORY: CULTURE: NORMAL

## 2015-11-26 LAB — C-REACTIVE PROTEIN: CRP: 17.6 mg/dL — AB (ref <1.0)

## 2015-11-26 LAB — CULTURE, RESPIRATORY W GRAM STAIN

## 2015-11-26 NOTE — Progress Notes (Signed)
[facility] NAME: Jeffrey [name]    MR#:  [id number]  DATE OF BIRTH:  [birth year]  SUBJECTIVE:  CHIEF COMPLAINT:   Chief Complaint  Patient presents with  . general sickness   Brown sputum SOB better. Afebrile.  REVIEW OF SYSTEMS:    Review of Systems  Constitutional: Positive for malaise/fatigue and weight loss. Negative for chills and fever.  HENT: Negative for sore throat.   Eyes: Negative for blurred vision, double vision and pain.  Respiratory: Positive for cough, sputum production and shortness of breath. Negative for hemoptysis and wheezing.   Cardiovascular: Positive for chest pain. Negative for palpitations, orthopnea and leg swelling.  Gastrointestinal: Negative for abdominal pain, constipation, diarrhea, heartburn, nausea and vomiting.  Genitourinary: Negative for dysuria and hematuria.  Musculoskeletal: Negative for back pain and joint pain.  Skin: Negative for rash.  Neurological: Positive for weakness. Negative for sensory change, speech change, focal weakness and headaches.  Endo/Heme/Allergies: Does not bruise/bleed easily.  Psychiatric/Behavioral: Negative for depression. The patient is not nervous/anxious.     DRUG ALLERGIES:   Allergies  Allergen Reactions  . Aspirin Nausea Only  . Taxol [Paclitaxel] Other (See Comments)    Diaphoretic, flushing and tightness in chest    VITALS:  Blood pressure 103/66, pulse (!) 103, temperature 98.9 F (37.2 C), temperature source Oral, resp. rate 18, height 5\' 7"  (1.702 m), weight 51.3 kg (113 lb), SpO2 97 %.  PHYSICAL EXAMINATION:   Physical Exam  GENERAL:  65 y.o.-year-old patient lying in the bed with no acute distress.  EYES: Pupils equal, round, reactive to light and accommodation. No scleral icterus. Extraocular muscles intact.  HEENT: Head atraumatic, normocephalic. Oropharynx and nasopharynx clear.  NECK:  Supple, no jugular venous distention. No  thyroid enlargement, no tenderness.  LUNGS: Normal breath sounds bilaterally, no wheezing, rales, rhonchi. No use of accessory muscles of respiration.  CARDIOVASCULAR: S1, S2 normal. No murmurs, rubs, or gallops.  ABDOMEN: Soft, nontender, nondistended. Bowel sounds present. No organomegaly or mass.  EXTREMITIES: No cyanosis, clubbing or edema b/l.    NEUROLOGIC: Cranial nerves II through XII are intact. No focal Motor or sensory deficits b/l.   PSYCHIATRIC: The patient is alert and oriented x 3.  SKIN: No obvious rash, lesion, or ulcer.   LABORATORY PANEL:   CBC  Recent Labs Lab 11/26/15 0527  WBC 18.0*  HGB 8.8*  HCT 25.8*  PLT 172   ------------------------------------------------------------------------------------------------------------------ Chemistries   Recent Labs Lab 11/23/15 1640  11/25/15 0523  NA 137  < > 145  K 4.7  < > 4.0  CL 102  < > 116*  CO2 22  < > 23  GLUCOSE 111*  < > 99  BUN 92*  < > 53*  CREATININE 2.15*  < > 1.54*  CALCIUM 8.9  < > 8.2*  AST 16  --   --   ALT 7*  --   --   ALKPHOS 70  --   --   BILITOT 0.7  --   --   < > = values in this interval not displayed. ------------------------------------------------------------------------------------------------------------------  Cardiac Enzymes  Recent Labs Lab 11/23/15 1640  TROPONINI 0.03*   ------------------------------------------------------------------------------------------------------------------  RADIOLOGY:  Ct Chest Wo Contrast  Result Date: 11/24/2015 CLINICAL DATA:  Right middle lobe mass. History of esophageal cancer. EXAM: CT CHEST WITHOUT CONTRAST TECHNIQUE: Multidetector CT imaging of the chest was performed following the standard protocol without IV contrast. COMPARISON:  Chest  radiograph 11/23/2015, PET-CT 09/25/2015 FINDINGS: Cardiovascular: Right internal jugular approach injectable port terminates in the distal superior vena cava. The heart is normal in size. There is  small pericardial effusion, measuring up to 11 mm at the base of the heart. Calcific atherosclerotic disease of the coronary arteries noted. The thoracic aorta is torturous. There is irregular thickening of the distal esophagus, consistent with the given history of esophageal cancer. The upper esophagus upstream of the esophageal mass is dilated. Small amount of frothy material, likely esophageal secretions, is seen in the dependent portion of the upper esophagus. Mediastinum/Nodes: There is probably necrotic subcarinal lymphadenopathy, inseparable from the esophageal mass. Lungs/Pleura: Main bronchi are patent. There is a large 9.3 by 7.2 by 10.0 cm soft tissue mass versus area of dense airspace consolidation containing central cavitation filled with frothy material in the right hemithorax. This mass is centered in the posterior segment of the right upper lobe, but involves a significant portion of the superior segment of the right lower lobe as well and is not restricted by the interlobar fissure. Patent bronchi are seen transversing this mass medially. This abnormality is inseparable from the esophageal malignancy. Patchy areas of airspace consolidation are seen in the basilar segments of the right lower lobe, right middle lobe, and lingula, with surrounding larger areas of ground-glass opacity. Nodular and linear mass versus scarring is seen in the posterior segment of the left upper lobe. Upper Abdomen: Peg tube is in place. Calcified splenic granulomas are noted. Musculoskeletal: No chest wall mass or suspicious bone lesions identified. IMPRESSION: Large cavitary mass versus areas of cavitary pneumonia in the posterior right hemithorax, involving the posterior segment of right upper lobe and superior segment of right lower lobe. This mass abuts the known esophageal malignancy and direct communication cannot be excluded. Areas of patchy airspace consolidation in bilateral lungs may represent multifocal  pneumonia, with aspiration pneumonia being a consideration. Lymphangitic spread of malignancy although possible is favored less likely. Left upper lobe nodule and linear opacity, likely representing scarring, given no significant increased radiotracer activity was noted in this area of the lung on prior PET-CT. Likely necrotic subcarinal lymphadenopathy. Small pericardial effusion. Electronically Signed   By: Fidela Salisbury M.D.   On: 11/24/2015 16:03     ASSESSMENT AND PLAN:   [name] history of esophageal cancer on active chemotherapy presenting with cough  * Right upper lobe cavitary lesion. Abscess or tumor extension - worsening leukocytosis. Sepsis present on admission. With fever and leucocytosis will continue IV antibiotics. MRSA PCR negative and discontinued vancomycin Sputum culture sent and pending 4 weeks of abx .PO vs IV Discussed with Dr. Donzetta Matters of oncology who suggested consult infectious disease for antibiotic recommendations. Consult placed. We will discussed with Dr. Ola Spurr.  * Sepsis Resolved  * Acutely worsening chronic anemia - could be due to chemotherapy Received 2 units PRBC Gastric aspirate negative for occult blood  * AKI over CKD3 Resolved. Stop IVF  * Malnutrition:  Continue jevity free water.  * Essential hypertension Stopped Norvasc due to hypotension  * Esophageal cancer: Oncology on board Discussed with Dr. Bradd Canary.  * DVT prophylaxis SCDs due to anemia  All the records are reviewed and case discussed with Care Management/Social Workerr. Management plans discussed with the patient, family and they are in agreement.  CODE STATUS: FULL CODE  DVT Prophylaxis: SCDs  TOTAL CC TIME TAKING CARE OF THIS PATIENT: 35 minutes.   POSSIBLE D/C IN 2-3 DAYS, DEPENDING ON CLINICAL CONDITION.  Hillary Bow R M.D on 11/26/2015 at 12:38 PM  Between 7am to 6pm - Pager - [phone]  After 6pm go to  [email or web address] - password [email or web address] [facility]  Office  [phone]  CC: Primary care physician; Adin Hector, MD  Note: This dictation was prepared with Dragon dictation along with smaller phrase technology. Any transcriptional errors that result from this process are unintentional.

## 2015-11-26 NOTE — Progress Notes (Signed)
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 -Continue antibiotics. -Likely require 4-6 weeks of Augmentin 875 by mouth twice a day and possibly longer. -Patient will need to remain nothing by mouth. -Outpatient follow-up in 4 weeks to perform a chest x-ray and evaluate progression.  Subcarinal and right hilar lymphadenopathy, likely secondary to esophageal cancer. -subcarinal lymphadenopathy appears to have progressed since the previous films, with possible extension into the right hilar area.  Acute respiratory failure. -Secondary to above, continue antibiotics, advance activity.  Esophageal cancer. -Patient's aspiration pneumonia is likely secondary to esophageal dysmotility with esophageal obstruction. Continue management as per oncology. A palliative care consult may be helpful in regards to managing his symptoms outpatient.  Pt appears to be stable from respiratory standpoint for discharge, pulmonary service will sign off for now, please call if there are any further questions or concerns. My office will be contacting the patient to establish follow-up.  Date: 11/26/2015  MRN# [id number] Jeffrey [name] [birth year]   Jeffrey [name] is a 65 y.o. old male seen in follow up for chief complaint of  Chief Complaint  Patient presents with  . general sickness     HPI:   No new complaints today.  Medication:   Reviewed  Allergies:  Aspirin and Taxol [paclitaxel]  Review of Systems: Gen:  Denies  fever, sweats. HEENT: Denies blurred vision. Cvc:  No dizziness, chest pain or heaviness Resp:   Denies cough or sputum porduction. Gi: Denies swallowing difficulty, stomach pain. Constipation. Gu:  Denies bladder incontinence, burning urine Ext:   No Joint pain, stiffness. Skin: No skin rash, easy bruising. Endoc:  No polyuria, polydipsia. Psych: No  depression, insomnia. Other:  All other systems were reviewed and found to be negative other than what is mentioned in the HPI.   Physical Examination:   VS: BP 103/66 (BP Location: Left Arm)   Pulse (!) 103   Temp 98.9 F (37.2 C) (Oral)   Resp 18   Ht 5\' 7"  (1.702 m)   Wt 113 lb (51.3 kg)   SpO2 97%   BMI 17.70 kg/m   General Appearance: No distress  Neuro:without focal findings,  speech normal,  HEENT: PERRLA, EOM intact. Pulmonary: normal breath sounds, No wheezing.   CardiovascularNormal S1,S2.  No m/r/g.   Abdomen: Benign, Soft, non-tender. Renal:  No costovertebral tenderness  GU:  Not performed at this time. Endoc: No evident thyromegaly, no signs of acromegaly. Skin:   warm, no rash. Extremities: normal, no cyanosis, clubbing.   LABORATORY PANEL:   CBC  Recent Labs Lab 11/26/15 0527  WBC 18.0*  HGB 8.8*  HCT 25.8*  PLT 172   ------------------------------------------------------------------------------------------------------------------  Chemistries   Recent Labs Lab 11/23/15 1640  11/25/15 0523  NA 137  < > 145  K 4.7  < > 4.0  CL 102  < > 116*  CO2 22  < > 23  GLUCOSE 111*  < > 99  BUN 92*  < > 53*  CREATININE 2.15*  < > 1.54*  CALCIUM 8.9  < > 8.2*  AST 16  --   --   ALT 7*  --   --   ALKPHOS 70  --   --   BILITOT 0.7  --   --   < > = values in this interval not displayed. ------------------------------------------------------------------------------------------------------------------  Cardiac Enzymes  Recent Labs Lab 11/23/15 1640  TROPONINI 0.03*   ------------------------------------------------------------  RADIOLOGY:   No results found for this or any previous visit. Results for orders placed during the hospital encounter of 11/23/15  DG Chest 2 View   Narrative CLINICAL DATA:  Esophageal cancer.  Cough, dehydration.  EXAM: CHEST  2 VIEW  COMPARISON:  Chest x-ray dated 10/30/2015.  FINDINGS: New cavitary  mass/consolidation within the suprahilar right upper lobe, measuring approximately 6.6 x 5.8 cm.  Additional mild perihilar edema, right slightly greater than left. No pleural effusion or pneumothorax seen. Heart size is normal. Right chest wall Port-A-Cath stable in position with tip at the level of the mid SVC. Osseous structures about the chest are unremarkable. Gastrostomy tube in place.  IMPRESSION: 1. New cavitary mass/consolidation within the right upper lobe, suprahilar, measuring approximately 6.6 x 5.8 cm. This could represent cavitary pneumonia or cavitary metastasis. Favor cavitary pneumonia. 2. Mild interstitial edema.  No pleural effusion. 3. Heart size is normal. These results were called by telephone at the time of interpretation on 11/23/2015 at 3:44 pm to Dr. Charlaine Dalton , who verbally acknowledged these results.   Electronically Signed   By: Franki Cabot M.D.   On: 11/23/2015 15:45    ------------------------------------------------------------------------------------------------------------------  Thank  you for allowing [facility], Critical Care to assist in the care of your patient. Our recommendations are noted above.  Please contact us if we can be of further service.   Marda Stalker, MD.   Pulmonary and Critical Care Office Number: [phone]  Patricia Pesa, M.D.  Vilinda Boehringer, M.D.  Merton Border, M.D  11/26/2015

## 2015-11-26 NOTE — Progress Notes (Signed)
[place] NOTE  Patient Care Team: Adin Hector, MD as PCP - General (Internal Medicine) Clent Jacks, RN as Registered Nurse  CHIEF COMPLAINTS/PURPOSE OF CONSULTATION: Metastatic squamous cell cancer of esophagus  CC:  Overall feels little better since admitted. He has been walking the bathroom. However still coughing; productive sputum. No hemoptysis. Fevers. No nausea or vomiting.  MEDICAL HISTORY:  Past Medical History:  Diagnosis Date  . Bladder cancer [facility])    s/p surgery and reconstruction  . Chronic kidney disease    stage 3  . Emphysema of lung ([place])   . Esophageal cancer ([place])   . Hypertension     SURGICAL HISTORY: Past Surgical History:  Procedure Laterality Date  . BLADDER REMOVAL    . ESOPHAGOGASTRODUODENOSCOPY N/A 05/22/2015   Procedure: ESOPHAGOGASTRODUODENOSCOPY (EGD);  Surgeon: Manya Silvas, MD;  Location[facility] ENDOSCOPY;  Service: Endoscopy;  Laterality: N/A;  . ESOPHAGOGASTRODUODENOSCOPY (EGD) WITH PROPOFOL N/A 01/21/2015   Procedure: ESOPHAGOGASTRODUODENOSCOPY (EGD) WITH PROPOFOL-looking in the esophagus stomach and upper small intestine to evaluate and treat;  Surgeon: [name], MD;  [facility] END[facility];  Service: Endoscopy;  Laterality: N/A;  . GASTROSTOMY W/ FEEDING TUBE Left    placed end of october 2016  . LYMPH NODE BIOPSY N/A 02/04/2015   Procedure: LYMPH NODE BIOPSY;  Surgeon: [name], MD;  Location: [facility] ORS;  Service: General;  Laterality: N/A;  . PORTACATH PLACEMENT Right 02/04/2015   Procedure: INSERTION PORT-A-CATH;  Surgeon: [name], MD;  Location: [facility] ORS;  Service: General;  Laterality: Right;    SOCIAL HISTORY: Social History   Social History  . Marital status: Married    Spouse name: N/A  . Number of children: N/A  . Years of education: N/A   Occupational History  . Not on file.   Social History Main Topics  . Smoking status: Current Every Day Smoker    Packs/day: 0.50     Years: 47.00    Types: Cigarettes  . Smokeless tobacco: Current User    Types: Chew  . Alcohol use No     Comment: quit drinking 3 months ago  . Drug use: No  . Sexual activity: Not on file   Other Topics Concern  . Not on file   Social History Narrative   Lives at home with wife. Independent at baseline.    FAMILY HISTORY: Family History  Problem Relation Age of Onset  . CAD Father   . Breast cancer Mother     ALLERGIES:  is allergic to aspirin and taxol [paclitaxel].  MEDICATIONS:  Current Facility-Administered Medications  Medication Dose Route Frequency Provider Last Rate Last Dose  . acetaminophen (TYLENOL) tablet 650 mg  650 mg Oral Q6H PRN Lytle Butte, MD       Or  . acetaminophen (TYLENOL) suppository 650 mg  650 mg Rectal Q6H PRN Lytle Butte, MD      . antiseptic oral rinse (CPC / CETYLPYRIDINIUM CHLORIDE 0.05%) solution 7 mL  7 mL Mouth Rinse q12n4p Alexis Hugelmeyer, DO   7 mL at 11/26/15 1627  . chlorpheniramine-HYDROcodone (TUSSIONEX) 10-8 MG/5ML suspension 5 mL  5 mL Per Tube Q12H Hillary Bow, MD   5 mL at 11/26/15 [id number]  . feeding supplement (JEVITY 1.5 CAL/FIBER) liquid 237 mL  237 mL Per Tube 6 X Daily Hillary Bow, MD   237 mL at 11/26/15 1632  . free water 200 mL  200 mL Per Tube Q8H Srikar Sudini,  MD   200 mL at 11/26/15 1310  . heparin injection 5,000 Units  5,000 Units Subcutaneous Q8H Lytle Butte, MD   5,000 Units at 11/26/15 1309  . ipratropium-albuterol (DUONEB) 0.5-2.5 (3) MG/3ML nebulizer solution 3 mL  3 mL Nebulization Q4H PRN Lytle Butte, MD      . ondansetron [facility]) tablet 4 mg  4 mg Oral Q6H PRN Lytle Butte, MD       Or  . ondansetron [facility]) injection 4 mg  4 mg Intravenous Q6H PRN Lytle Butte, MD      . oxyCODONE (ROXICODONE) 5 MG/5ML solution 5 mg  5 mg Per Tube Q4H PRN Cammie Sickle, MD   5 mg at 11/26/15 1749  . piperacillin-tazobactam (ZOSYN) IVPB 3.375 g  3.375 g Intravenous Q8H Lytle Butte, MD   3.375 g at  11/26/15 1632  . sennosides (SENOKOT) 8.8 MG/5ML syrup 15 mL  15 mL Oral BID Hillary Bow, MD   15 mL at 11/25/15 2145  . TRIPLE ANTIBIOTIC [id number] OINT 1 application  1 application Topical Daily Lytle Butte, MD   1 application at [id number] [id number]   Facility-Administered Medications Ordered in Other Encounters  Medication Dose Route Frequency Provider Last Rate Last Dose  . sodium chloride 0.9 % injection 10 mL  10 mL Intracatheter PRN Cammie Sickle, MD   10 mL at 03/04/15 0933      .  PHYSICAL EXAMINATION:  Vitals:   11/26/15 0549 11/26/15 1438  BP: 103/66 107/62  Pulse: (!) 103 (!) 110  Resp:  20  Temp: 98.9 F (37.2 C) 97.8 F (36.6 C)   Filed Weights   11/23/15 1620 11/23/15 1655 11/24/15 0303  Weight: 107 lb 6.4 oz (48.7 kg) 115 lb 14.4 oz (52.6 kg) 113 lb (51.3 kg)    GENERAL: Thin build cachectic-appearing Afro-American male patient Alert, no distress and comfortable.  Alone. EYES: no pallor or icterus OROPHARYNX: no thrush or ulceration. NECK: supple, no masses felt LYMPH:  no palpable lymphadenopathy in the cervical, axillary or inguinal regions LUNGS: decreased breath sounds  right side compared to left and  No wheeze or crackles HEART/CVS: regular rate & rhythm and no murmurs; No lower extremity edema ABDOMEN: abdomen soft, non-tender and normal bowel sounds; positive for PEG tube Musculoskeletal:no cyanosis of digits and no clubbing  PSYCH: alert & oriented x 3 with fluent speech NEURO: no focal motor/sensory deficits SKIN:  no rashes or significant lesions  LABORATORY DATA:  I have reviewed the data as listed Lab Results  Component Value Date   WBC 18.0 (H) 11/26/2015   HGB 8.8 (L) 11/26/2015   HCT 25.8 (L) 11/26/2015   MCV 90.1 11/26/2015   PLT 172 11/26/2015    Recent Labs  11/02/15 1142  11/23/15 1044 11/23/15 1640 11/24/15 0525 11/25/15 0523  NA 132*  < > 133* 137 142 145  K 4.7  < > 4.6 4.7 4.3 4.0  CL 96*  < > 97* 102 111  116*  CO2 25  < > 24 22 21* 23  GLUCOSE 94  < > 135* 111* 95 99  BUN 41*  < > 96* 92* 76* 53*  CREATININE 1.59*  < > 2.17* 2.15* 1.73* 1.54*  CALCIUM 8.8*  < > 8.9 8.9 8.3* 8.2*  GFRNONAA 45*  < > 31* 31* 40* 46*  GFRAA 52*  < > 35* 36* 47* 54*  PROT 7.8  --  8.2* 7.6  --   --  ALBUMIN 3.0*  --  2.5* 2.4*  --   --   AST 16  --  17 16  --   --   ALT 9*  --  10* 7*  --   --   ALKPHOS 53  --  74 70  --   --   BILITOT 0.3  --  0.7 0.7  --   --   < > = values in this interval not displayed.  RADIOGRAPHIC STUDIES: I have personally reviewed the radiological images as listed and agreed with the findings in the report. Dg Chest 2 View  Result Date: 11/23/2015 CLINICAL DATA:  Esophageal cancer.  Cough, dehydration. EXAM: CHEST  2 VIEW COMPARISON:  Chest x-ray dated 10/30/2015. FINDINGS: New cavitary mass/consolidation within the suprahilar right upper lobe, measuring approximately 6.6 x 5.8 cm. Additional mild perihilar edema, right slightly greater than left. No pleural effusion or pneumothorax seen. Heart size is normal. Right chest wall Port-A-Cath stable in position with tip at the level of the mid SVC. Osseous structures about the chest are unremarkable. Gastrostomy tube in place. IMPRESSION: 1. New cavitary mass/consolidation within the right upper lobe, suprahilar, measuring approximately 6.6 x 5.8 cm. This could represent cavitary pneumonia or cavitary metastasis. Favor cavitary pneumonia. 2. Mild interstitial edema.  No pleural effusion. 3. Heart size is normal. These results were called by telephone at the time of interpretation on 11/23/2015 at 3:44 pm to Dr. Charlaine Dalton , who verbally acknowledged these results. Electronically Signed   By: Franki Cabot M.D.   On: 11/23/2015 15:45   Dg Chest 2 View  Result Date: 10/30/2015 CLINICAL DATA:  Cough.  History of esophageal carcinoma EXAM: CHEST  2 VIEW COMPARISON:  Chest radiograph February 04, 2015; PET-CT September 25, 2015 FINDINGS:  Port-A-Cath tip is in the superior vena cava. No pneumothorax. There is no edema or consolidation. Heart size and pulmonary vascularity within normal limits. No adenopathy evident. No bone lesions. There is calcification in the left carotid artery. IMPRESSION: Port-A-Cath tip in superior vena cava. No pneumothorax. No edema or consolidation. No adenopathy evident by radiography. The known esophageal mass is not appreciable by radiography. Small focus of calcification left carotid artery. Electronically Signed   By: Lowella Grip III M.D.   On: 10/30/2015 10:46  Ct Chest Wo Contrast  Result Date: 11/24/2015 CLINICAL DATA:  Right middle lobe mass. History of esophageal cancer. EXAM: CT CHEST WITHOUT CONTRAST TECHNIQUE: Multidetector CT imaging of the chest was performed following the standard protocol without IV contrast. COMPARISON:  Chest radiograph 11/23/2015, PET-CT 09/25/2015 FINDINGS: Cardiovascular: Right internal jugular approach injectable port terminates in the distal superior vena cava. The heart is normal in size. There is small pericardial effusion, measuring up to 11 mm at the base of the heart. Calcific atherosclerotic disease of the coronary arteries noted. The thoracic aorta is torturous. There is irregular thickening of the distal esophagus, consistent with the given history of esophageal cancer. The upper esophagus upstream of the esophageal mass is dilated. Small amount of frothy material, likely esophageal secretions, is seen in the dependent portion of the upper esophagus. Mediastinum/Nodes: There is probably necrotic subcarinal lymphadenopathy, inseparable from the esophageal mass. Lungs/Pleura: Main bronchi are patent. There is a large 9.3 by 7.2 by 10.0 cm soft tissue mass versus area of dense airspace consolidation containing central cavitation filled with frothy material in the right hemithorax. This mass is centered in the posterior segment of the right upper lobe, but involves a  significant  portion of the superior segment of the right lower lobe as well and is not restricted by the interlobar fissure. Patent bronchi are seen transversing this mass medially. This abnormality is inseparable from the esophageal malignancy. Patchy areas of airspace consolidation are seen in the basilar segments of the right lower lobe, right middle lobe, and lingula, with surrounding larger areas of ground-glass opacity. Nodular and linear mass versus scarring is seen in the posterior segment of the left upper lobe. Upper Abdomen: Peg tube is in place. Calcified splenic granulomas are noted. Musculoskeletal: No chest wall mass or suspicious bone lesions identified. IMPRESSION: Large cavitary mass versus areas of cavitary pneumonia in the posterior right hemithorax, involving the posterior segment of right upper lobe and superior segment of right lower lobe. This mass abuts the known esophageal malignancy and direct communication cannot be excluded. Areas of patchy airspace consolidation in bilateral lungs may represent multifocal pneumonia, with aspiration pneumonia being a consideration. Lymphangitic spread of malignancy although possible is favored less likely. Left upper lobe nodule and linear opacity, likely representing scarring, given no significant increased radiotracer activity was noted in this area of the lung on prior PET-CT. Likely necrotic subcarinal lymphadenopathy. Small pericardial effusion. Electronically Signed   By: Fidela Salisbury M.D.   On: 11/24/2015 16:03   Dg Perc Gastrostomy Tube Insert W/fluoro  Result Date: 11/12/2015 CLINICAL DATA:  Replacement of gastrostomy tube. EXAM: PERCUTANEOUS GASTROSTOMY TUBE REMOVAL MEDICATIONS: None. ANESTHESIA/SEDATION: Versed 2.0 mg IV; Fentanyl 100 mcg IV Moderate Sedation Time:  20 The patient was continuously monitored during the procedure by the interventional radiology nurse under my direct supervision. CONTRAST:  10 mL - administered into  the gastric lumen. FLUOROSCOPY TIME:  Fluoroscopy Time: 0 minutes 54 seconds (14.10 mGy). COMPLICATIONS: None immediate. PROCEDURE: The risk and benefits of this procedure were discussed with the patient and informed consent was obtained. The patient was placed supine on fluoroscopic table. Left upper quadrant and previous gastrostomy tract were prepped and draped using sterile technique. Under fluoroscopic guidance, directional catheter and guidewire was passed through pre-existing gastrostomy track and directed into gastric lumen. Contrast was injected to confirm intraluminal position. Using 16 and 18 French dilators, gastrostomy tract was dilated over the guidewire. Then, 43 French gastrostomy catheter was placed and positioned under fluoroscopy. Contrast is injected to confirm its intraluminal position. The internal balloon was inflated. External portion of the catheter was secured. IMPRESSION: Under fluoroscopic guidance, successful replacement of 18 French gastrostomy catheter through pre-existing gastrostomy site. Electronically Signed   By: Marijo Conception, M.D.   On: 11/12/2015 13:40    ASSESSMENT & PLAN:   # 65 yo with metastatic squamous cell esophageal cancer admitted to hospital for cough/increasing dyspnea-  # RUL cavitary mass ~10cm- highly suggestive for infectious etiology/pneumonia- less likely malignancy. Mild clinical improvement noted on antibiotics. However white count slightly up to 18,000 today. I would recommend ID consultation regarding possible IV antibiotics;  length of duration. Reviewed pulmonary recommendations.  # Severe anemia- multifactorial- CKD/maliganancy/ chemotherapy- less likley acute GI bleed- s/p PRBC transfusion- hemoglobin stable at 8.3.  # Metastatic squamous cell ca- on abraxane s/p 2 cycles- clinically stable disease [left neck LN]. currently on hold chemotherapy.  # Right sided pleuritic chest pain-on oxycodone liquid. Stable.  # Reviewed with Dr.  Darvin Neighbours.    Cammie Sickle, MD 11/26/2015 5:57 PM

## 2015-11-27 LAB — CBC WITH DIFFERENTIAL/PLATELET
Basophils Absolute: 0.3 10*3/uL — ABNORMAL HIGH (ref 0–0.1)
Basophils Relative: 2 %
EOS ABS: 0.1 10*3/uL (ref 0–0.7)
EOS PCT: 0 %
HCT: 24.4 % — ABNORMAL LOW (ref 40.0–52.0)
Hemoglobin: 8.3 g/dL — ABNORMAL LOW (ref 13.0–18.0)
LYMPHS ABS: 0.7 10*3/uL — AB (ref 1.0–3.6)
Lymphocytes Relative: 5 %
MCH: 30.6 pg (ref 26.0–34.0)
MCHC: 33.9 g/dL (ref 32.0–36.0)
MCV: 90.1 fL (ref 80.0–100.0)
MONOS PCT: 8 %
Monocytes Absolute: 1.2 10*3/uL — ABNORMAL HIGH (ref 0.2–1.0)
Neutro Abs: 12.2 10*3/uL — ABNORMAL HIGH (ref 1.4–6.5)
Neutrophils Relative %: 85 %
PLATELETS: 161 10*3/uL (ref 150–440)
RBC: 2.71 MIL/uL — ABNORMAL LOW (ref 4.40–5.90)
RDW: 18.6 % — AB (ref 11.5–14.5)
WBC: 14.3 10*3/uL — ABNORMAL HIGH (ref 3.8–10.6)

## 2015-11-27 LAB — RAPID HIV SCREEN (HIV 1/2 AB+AG)
HIV 1/2 Antibodies: NONREACTIVE
HIV-1 P24 ANTIGEN - HIV24: NONREACTIVE

## 2015-11-27 LAB — BASIC METABOLIC PANEL
Anion gap: 11 (ref 5–15)
BUN: 36 mg/dL — AB (ref 6–20)
CO2: 22 mmol/L (ref 22–32)
CREATININE: 1.56 mg/dL — AB (ref 0.61–1.24)
Calcium: 8.5 mg/dL — ABNORMAL LOW (ref 8.9–10.3)
Chloride: 114 mmol/L — ABNORMAL HIGH (ref 101–111)
GFR calc Af Amer: 53 mL/min — ABNORMAL LOW (ref >60)
GFR, EST NON AFRICAN AMERICAN: 46 mL/min — AB (ref >60)
Glucose, Bld: 95 mg/dL (ref 65–99)
Potassium: 4.1 mmol/L (ref 3.5–5.1)
SODIUM: 147 mmol/L — AB (ref 135–145)

## 2015-11-27 NOTE — Consult Note (Signed)
[facility] Infectious Disease     Reason for Consult:sepsis,    Referring Physician: Bobetta Lime Date of Admission:  11/23/2015   Active Problems:   Sepsis ([place])   HPI: Jeffrey [name] is a 65 y.o. male admitted 8/21 with cough and sub fevers.  On admit wbc 21 K and CT shows large cavitary mass vs cavitary PNA in R LL and RUL.  Also with bil airspace diseass.  He has esophageal cancer and PEG placed several weeks ago and is on chemo. On vanco and zosyn initialy and now just zosyn (day 5). WBC down to 14, no fevers.  Sputum with oral flora.  Reports less thick sputum since admit. No fevers  Past Medical History:  Diagnosis Date  . Bladder cancer [facility])    s/p surgery and reconstruction  . Chronic kidney disease    stage 3  . Emphysema of lung ([place])   . Esophageal cancer ([place])   . Hypertension    Past Surgical History:  Procedure Laterality Date  . BLADDER REMOVAL    . ESOPHAGOGASTRODUODENOSCOPY N/A 05/22/2015   Procedure: ESOPHAGOGASTRODUODENOSCOPY (EGD);  Surgeon: Manya Silvas, MD;  Location: [facility] ENDOSCOPY;  Service: Endoscopy;  Laterality: N/A;  . ESOPHAGOGASTRODUODENOSCOPY (EGD) WITH PROPOFOL N/A 01/21/2015   Procedure: ESOPHAGOGASTRODUODENOSCOPY (EGD) WITH PROPOFOL-looking in the esophagus stomach and upper small intestine to evaluate and treat;  Surgeon: [name], MD;  Location: [facility] ENDOSCOPY;  Service: Endoscopy;  Laterality: N/A;  . GASTROSTOMY W/ FEEDING TUBE Left    placed end of october 2016  . LYMPH NODE BIOPSY N/A 02/04/2015   Procedure: LYMPH NODE BIOPSY;  Surgeon: [name], MD;  Location: [facility] ORS;  Service: General;  Laterality: N/A;  . PORTACATH PLACEMENT Right 02/04/2015   Procedure: INSERTION PORT-A-CATH;  Surgeon: [name], MD;  Location: [facility] ORS;  Service: General;  Laterality: Right;   Social History  Substance Use Topics  . Smoking status: Current Every Day Smoker    Packs/day: 0.50    Years: 47.00    Types: Cigarettes  .  Smokeless tobacco: Current User    Types: Chew  . Alcohol use No     Comment: quit drinking 3 months ago   Family History  Problem Relation Age of Onset  . CAD Father   . Breast cancer Mother     Allergies:  Allergies  Allergen Reactions  . Aspirin Nausea Only  . Taxol [Paclitaxel] Other (See Comments)    Diaphoretic, flushing and tightness in chest    Current antibiotics: Antibiotics Given (last 72 hours)    Date/Time Action Medication Dose Rate   11/24/15 1718 Given   piperacillin-tazobactam (ZOSYN) IVPB 3.375 g 3.375 g 12.5 mL/hr   11/25/15 0020 Given   piperacillin-tazobactam (ZOSYN) IVPB 3.375 g 3.375 g 12.5 mL/hr   11/25/15 0803 Given   piperacillin-tazobactam (ZOSYN) IVPB 3.375 g 3.375 g 12.5 mL/hr   11/25/15 0935 Given   vancomycin (VANCOCIN) 500 mg in sodium chloride 0.9 % 100 mL IVPB 500 mg 100 mL/hr   11/25/15 1819 Given   piperacillin-tazobactam (ZOSYN) IVPB 3.375 g 3.375 g 12.5 mL/hr   11/26/15 0118 Given   piperacillin-tazobactam (ZOSYN) IVPB 3.375 g 3.375 g 12.5 mL/hr   11/26/15 0928 Given   piperacillin-tazobactam (ZOSYN) IVPB 3.375 g 3.375 g 12.5 mL/hr   11/26/15 1632 Given   piperacillin-tazobactam (ZOSYN) IVPB 3.375 g 3.375 g 12.5 mL/hr   11/27/15 0136 Given   piperacillin-tazobactam (ZOSYN) IVPB 3.375 g 3.375 g 12.5 mL/hr  11/27/15 0723 Given   piperacillin-tazobactam (ZOSYN) IVPB 3.375 g 3.375 g 12.5 mL/hr   11/27/15 1535 Given   piperacillin-tazobactam (ZOSYN) IVPB 3.375 g 3.375 g 12.5 mL/hr      MEDICATIONS: . antiseptic oral rinse  7 mL Mouth Rinse q12n4p  . chlorpheniramine-HYDROcodone  5 mL Per Tube Q12H  . feeding supplement (JEVITY 1.5 CAL/FIBER)  237 mL Per Tube 6 X Daily  . free water  200 mL Per Tube Q8H  . heparin subcutaneous  5,000 Units Subcutaneous Q8H  . piperacillin-tazobactam (ZOSYN)  IV  3.375 g Intravenous Q8H  . sennosides  15 mL Oral BID  . TRIPLE ANTIBIOTIC  1 application Topical Daily    Review of Systems - 11  systems reviewed and negative per HPI   OBJECTIVE: Temp:  [98.4 F (36.9 C)-98.9 F (37.2 C)] 98.9 F (37.2 C) (08/25 1209) Pulse Rate:  [112-120] 118 (08/25 1209) Resp:  [16] 16 (08/25 0414) BP: (97-107)/(62-71) 97/62 (08/25 1209) SpO2:  [93 %-98 %] 98 % (08/25 1209) Physical Exam  Constitutional: He is oriented to person, place, and time. Cachectic HENT:  Mouth/Throat: Oropharynx is clear and dry. No oropharyngeal exudate.  Cardiovascular: Normal rate, regular rhythm and normal heart sounds.Pulmonary/Chest:R base rub and rhonchi Abdominal: Soft. Bowel sounds are normal. He exhibits no distension. Peg in place Lymphadenopathy: He has marble sized firm L neck node Neurological: He is alert and oriented to person, place, and time.  Skin: Skin is warm and dry. No rash noted. No erythema.  Psychiatric: He has a normal mood and affect. His behavior is normal.     LABS: Results for orders placed or performed during the hospital encounter of 11/23/15 (from the past 48 hour(s))  CBC with Differential/Platelet     Status: Abnormal   Collection Time: 11/26/15  5:27 AM  Result Value Ref Range   WBC 18.0 (H) 3.8 - 10.6 K/uL   RBC 2.87 (L) 4.40 - 5.90 MIL/uL   Hemoglobin 8.8 (L) 13.0 - 18.0 g/dL   HCT 25.8 (L) 40.0 - 52.0 %   MCV 90.1 80.0 - 100.0 fL   MCH 30.5 26.0 - 34.0 pg   MCHC 33.9 32.0 - 36.0 g/dL   RDW 18.3 (H) 11.5 - 14.5 %   Platelets 172 150 - 440 K/uL   Neutrophils Relative % 89 %   Neutro Abs 15.9 (H) 1.4 - 6.5 K/uL   Lymphocytes Relative 3 %   Lymphs Abs 0.5 (L) 1.0 - 3.6 K/uL   Monocytes Relative 8 %   Monocytes Absolute 1.5 (H) 0.2 - 1.0 K/uL   Eosinophils Relative 0 %   Eosinophils Absolute 0.1 0 - 0.7 K/uL   Basophils Relative 0 %   Basophils Absolute 0.0 0 - 0.1 K/uL  C-reactive protein     Status: Abnormal   Collection Time: 11/26/15  9:11 AM  Result Value Ref Range   CRP 17.6 (H) <1.0 mg/dL    Comment: Performed at [facility]  CBC with  Differential/Platelet     Status: Abnormal   Collection Time: 11/27/15  4:00 AM  Result Value Ref Range   WBC 14.3 (H) 3.8 - 10.6 K/uL   RBC 2.71 (L) 4.40 - 5.90 MIL/uL   Hemoglobin 8.3 (L) 13.0 - 18.0 g/dL   HCT 24.4 (L) 40.0 - 52.0 %   MCV 90.1 80.0 - 100.0 fL   MCH 30.6 26.0 - 34.0 pg   MCHC 33.9 32.0 - 36.0 g/dL   RDW 18.6 (  H) 11.5 - 14.5 %   Platelets 161 150 - 440 K/uL   Neutrophils Relative % 85 %   Neutro Abs 12.2 (H) 1.4 - 6.5 K/uL   Lymphocytes Relative 5 %   Lymphs Abs 0.7 (L) 1.0 - 3.6 K/uL   Monocytes Relative 8 %   Monocytes Absolute 1.2 (H) 0.2 - 1.0 K/uL   Eosinophils Relative 0 %   Eosinophils Absolute 0.1 0 - 0.7 K/uL   Basophils Relative 2 %   Basophils Absolute 0.3 (H) 0 - 0.1 K/uL  Basic metabolic panel     Status: Abnormal   Collection Time: 11/27/15  4:00 AM  Result Value Ref Range   Sodium 147 (H) 135 - 145 mmol/L   Potassium 4.1 3.5 - 5.1 mmol/L   Chloride 114 (H) 101 - 111 mmol/L   CO2 22 22 - 32 mmol/L   Glucose, Bld 95 65 - 99 mg/dL   BUN 36 (H) 6 - 20 mg/dL   Creatinine, Ser 1.56 (H) 0.61 - 1.24 mg/dL   Calcium 8.5 (L) 8.9 - 10.3 mg/dL   GFR calc non Af Amer 46 (L) >60 mL/min   GFR calc Af Amer 53 (L) >60 mL/min    Comment: (NOTE) The eGFR has been calculated using the CKD EPI equation. This calculation has not been validated in all clinical situations. eGFR's persistently <60 mL/min signify possible Chronic Kidney Disease.    Anion gap 11 5 - 15   No components found for: ESR, C REACTIVE PROTEIN MICRO: Recent Results (from the past 720 hour(s))  Culture, blood (routine x 2)     Status: None (Preliminary result)   Collection Time: 11/23/15 11:43 AM  Result Value Ref Range Status   Specimen Description BLOOD RIGHT ARM  Final   Special Requests BOTTLES DRAWN AEROBIC AND ANAEROBIC 10CC  Final   Culture NO GROWTH 4 DAYS  Final   Report Status PENDING  Incomplete  Culture, blood (routine x 2)     Status: None (Preliminary result)    Collection Time: 11/23/15 11:48 AM  Result Value Ref Range Status   Specimen Description BLOOD LEFT ARM  Final   Special Requests BOTTLES DRAWN AEROBIC AND ANAEROBIC 10CC  Final   Culture NO GROWTH 4 DAYS  Final   Report Status PENDING  Incomplete  Urine culture     Status: Abnormal   Collection Time: 11/23/15 12:02 PM  Result Value Ref Range Status   Specimen Description URINE, CLEAN CATCH  Final   Special Requests IN and OUT Cath  Final   Culture >=100,000 COLONIES/mL CITROBACTER FREUNDII (A)  Final   Report Status 11/25/2015 FINAL  Final   Organism ID, Bacteria CITROBACTER FREUNDII (A)  Final      Susceptibility   Citrobacter freundii - MIC*    CEFAZOLIN >=64 RESISTANT Resistant     CEFTRIAXONE 16 INTERMEDIATE Intermediate     CIPROFLOXACIN 2 INTERMEDIATE Intermediate     GENTAMICIN <=1 SENSITIVE Sensitive     IMIPENEM <=0.25 SENSITIVE Sensitive     NITROFURANTOIN <=16 SENSITIVE Sensitive     TRIMETH/SULFA <=20 SENSITIVE Sensitive     PIP/TAZO 64 INTERMEDIATE Intermediate     * >=100,000 COLONIES/mL CITROBACTER FREUNDII  Blood Culture (routine x 2)     Status: None (Preliminary result)   Collection Time: 11/23/15  4:41 PM  Result Value Ref Range Status   Specimen Description BLOOD L FOREMARM  Final   Special Requests BACTERIAL CASTS 3ML  Final   Culture  NO GROWTH 4 DAYS  Final   Report Status PENDING  Incomplete  Blood Culture (routine x 2)     Status: None (Preliminary result)   Collection Time: 11/23/15  4:45 PM  Result Value Ref Range Status   Specimen Description BLOOD PORT RCHEST  Final   Special Requests BACTERIAL CASTS 7ML  Final   Culture NO GROWTH 4 DAYS  Final   Report Status PENDING  Incomplete  Culture, expectorated sputum-assessment     Status: None   Collection Time: 11/24/15  5:34 AM  Result Value Ref Range Status   Specimen Description EXPECTORATED SPUTUM  Final   Special Requests NONE  Final   Sputum evaluation THIS SPECIMEN IS ACCEPTABLE FOR SPUTUM  CULTURE  Final   Report Status 11/24/2015 FINAL  Final  Culture, respiratory (NON-Expectorated)     Status: None   Collection Time: 11/24/15  5:34 AM  Result Value Ref Range Status   Specimen Description EXPECTORATED SPUTUM  Final   Special Requests NONE Reflexed from [id number]  Final   Gram Stain   Final    ABUNDANT WBC PRESENT, PREDOMINANTLY PMN RARE SQUAMOUS EPITHELIAL CELLS PRESENT ABUNDANT GRAM POSITIVE COCCI IN PAIRS FEW GRAM NEGATIVE COCCOBACILLI RARE GRAM POSITIVE RODS    Culture   Final    Consistent with normal respiratory flora. Performed at [facility]    Report Status 11/26/2015 FINAL  Final  MRSA PCR Screening     Status: None   Collection Time: 11/24/15  3:00 PM  Result Value Ref Range Status   MRSA by PCR NEGATIVE NEGATIVE Final    Comment:        The GeneXpert MRSA Assay (FDA approved for NASAL specimens only), is one component of a comprehensive MRSA colonization surveillance program. It is not intended to diagnose MRSA infection nor to guide or monitor treatment for MRSA infections.     IMAGING: Dg Chest 2 View  Result Date: 11/23/2015 CLINICAL DATA:  Esophageal cancer.  Cough, dehydration. EXAM: CHEST  2 VIEW COMPARISON:  Chest x-ray dated 10/30/2015. FINDINGS: New cavitary mass/consolidation within the suprahilar right upper lobe, measuring approximately 6.6 x 5.8 cm. Additional mild perihilar edema, right slightly greater than left. No pleural effusion or pneumothorax seen. Heart size is normal. Right chest wall Port-A-Cath stable in position with tip at the level of the mid SVC. Osseous structures about the chest are unremarkable. Gastrostomy tube in place. IMPRESSION: 1. New cavitary mass/consolidation within the right upper lobe, suprahilar, measuring approximately 6.6 x 5.8 cm. This could represent cavitary pneumonia or cavitary metastasis. Favor cavitary pneumonia. 2. Mild interstitial edema.  No pleural effusion. 3. Heart size is normal. These  results were called by telephone at the time of interpretation on 11/23/2015 at 3:44 pm to Dr. Charlaine Dalton , who verbally acknowledged these results. Electronically Signed   By: Franki Cabot M.D.   On: 11/23/2015 15:45   Dg Chest 2 View  Result Date: 10/30/2015 CLINICAL DATA:  Cough.  History of esophageal carcinoma EXAM: CHEST  2 VIEW COMPARISON:  Chest radiograph February 04, 2015; PET-CT September 25, 2015 FINDINGS: Port-A-Cath tip is in the superior vena cava. No pneumothorax. There is no edema or consolidation. Heart size and pulmonary vascularity within normal limits. No adenopathy evident. No bone lesions. There is calcification in the left carotid artery. IMPRESSION: Port-A-Cath tip in superior vena cava. No pneumothorax. No edema or consolidation. No adenopathy evident by radiography. The known esophageal mass is not appreciable by radiography. Small focus  of calcification left carotid artery. Electronically Signed   By: Lowella Grip III M.D.   On: 10/30/2015 10:46  Ct Chest Wo Contrast  Result Date: 11/24/2015 CLINICAL DATA:  Right middle lobe mass. History of esophageal cancer. EXAM: CT CHEST WITHOUT CONTRAST TECHNIQUE: Multidetector CT imaging of the chest was performed following the standard protocol without IV contrast. COMPARISON:  Chest radiograph 11/23/2015, PET-CT 09/25/2015 FINDINGS: Cardiovascular: Right internal jugular approach injectable port terminates in the distal superior vena cava. The heart is normal in size. There is small pericardial effusion, measuring up to 11 mm at the base of the heart. Calcific atherosclerotic disease of the coronary arteries noted. The thoracic aorta is torturous. There is irregular thickening of the distal esophagus, consistent with the given history of esophageal cancer. The upper esophagus upstream of the esophageal mass is dilated. Small amount of frothy material, likely esophageal secretions, is seen in the dependent portion of the upper  esophagus. Mediastinum/Nodes: There is probably necrotic subcarinal lymphadenopathy, inseparable from the esophageal mass. Lungs/Pleura: Main bronchi are patent. There is a large 9.3 by 7.2 by 10.0 cm soft tissue mass versus area of dense airspace consolidation containing central cavitation filled with frothy material in the right hemithorax. This mass is centered in the posterior segment of the right upper lobe, but involves a significant portion of the superior segment of the right lower lobe as well and is not restricted by the interlobar fissure. Patent bronchi are seen transversing this mass medially. This abnormality is inseparable from the esophageal malignancy. Patchy areas of airspace consolidation are seen in the basilar segments of the right lower lobe, right middle lobe, and lingula, with surrounding larger areas of ground-glass opacity. Nodular and linear mass versus scarring is seen in the posterior segment of the left upper lobe. Upper Abdomen: Peg tube is in place. Calcified splenic granulomas are noted. Musculoskeletal: No chest wall mass or suspicious bone lesions identified. IMPRESSION: Large cavitary mass versus areas of cavitary pneumonia in the posterior right hemithorax, involving the posterior segment of right upper lobe and superior segment of right lower lobe. This mass abuts the known esophageal malignancy and direct communication cannot be excluded. Areas of patchy airspace consolidation in bilateral lungs may represent multifocal pneumonia, with aspiration pneumonia being a consideration. Lymphangitic spread of malignancy although possible is favored less likely. Left upper lobe nodule and linear opacity, likely representing scarring, given no significant increased radiotracer activity was noted in this area of the lung on prior PET-CT. Likely necrotic subcarinal lymphadenopathy. Small pericardial effusion. Electronically Signed   By: Fidela Salisbury M.D.   On: 11/24/2015 16:03    Dg Perc Gastrostomy Tube Insert W/fluoro  Result Date: 11/12/2015 CLINICAL DATA:  Replacement of gastrostomy tube. EXAM: PERCUTANEOUS GASTROSTOMY TUBE REMOVAL MEDICATIONS: None. ANESTHESIA/SEDATION: Versed 2.0 mg IV; Fentanyl 100 mcg IV Moderate Sedation Time:  20 The patient was continuously monitored during the procedure by the interventional radiology nurse under my direct supervision. CONTRAST:  10 mL - administered into the gastric lumen. FLUOROSCOPY TIME:  Fluoroscopy Time: 0 minutes 54 seconds (14.10 mGy). COMPLICATIONS: None immediate. PROCEDURE: The risk and benefits of this procedure were discussed with the patient and informed consent was obtained. The patient was placed supine on fluoroscopic table. Left upper quadrant and previous gastrostomy tract were prepped and draped using sterile technique. Under fluoroscopic guidance, directional catheter and guidewire was passed through pre-existing gastrostomy track and directed into gastric lumen. Contrast was injected to confirm intraluminal position. Using 16 and 18 [place]  dilators, gastrostomy tract was dilated over the guidewire. Then, 64 French gastrostomy catheter was placed and positioned under fluoroscopy. Contrast is injected to confirm its intraluminal position. The internal balloon was inflated. External portion of the catheter was secured. IMPRESSION: Under fluoroscopic guidance, successful replacement of 18 French gastrostomy catheter through pre-existing gastrostomy site. Electronically Signed   By: Marijo Conception, M.D.   On: 11/12/2015 13:40    Assessment:   MONTEE TALLMAN is a 65 y.o. male with esophageal malignancy, with PEG and Esophageal dysmotility admitted with cough and fever. Has large necrotic PNA likely aspiration. Cx with oral flora. Some response to zosyn. Has portacath in place. I think he could be treated with Augmentin as an outpatient 875 bid for at least 3 weeks. Since he has a portacath could use zosyn as outpatient  but really do not see a great need for this since no resistant organisms identified.    Recommendations I would keep in house to complete 2 more days of zosyn (7 days total)  then repeat cxr. If improving dc on augmentin 875 per GT for 3 more weeks with out pt fu Thank you very much for allowing me to participate in the care of this patient. Please call with questions.   Cheral Marker. Ola Spurr, MD

## 2015-11-27 NOTE — Progress Notes (Signed)
Pharmacy Antibiotic Note  Jeffrey [name] is a 65 y.o. male admitted on 11/23/2015 with pneumonia/sepsis.  Pharmacy has been consulted for piperacillin/tazobactam dosing.  Plan: Zosyn: Continue Zosyn 3.375 g IV q8 hours.    Height: 5\' 7"  (170.2 cm) Weight: 113 lb (51.3 kg) IBW/kg (Calculated) : 66.1  Temp (24hrs), Avg:98.2 F (36.8 C), Min:97.8 F (36.6 C), Max:98.5 F (36.9 C)   Recent Labs Lab 11/23/15 1044 11/23/15 1630 11/23/15 1640 11/23/15 2041 11/24/15 0525 11/25/15 0523 11/26/15 0527 11/27/15 0400  WBC 21.6*  --  18.1*  --  19.3* 16.0* 18.0* 14.3*  CREATININE 2.17*  --  2.15*  --  1.73* 1.54*  --  1.56*  LATICACIDVEN  --  2.2*  --  1.2  --   --   --   --     Estimated Creatinine Clearance: 35.2 mL/min (by C-G formula based on SCr of 1.56 mg/dL).    Allergies  Allergen Reactions  . Aspirin Nausea Only  . Taxol [Paclitaxel] Other (See Comments)    Diaphoretic, flushing and tightness in chest    Antimicrobials this admission: Vancomycin 8/21 >>  Zosyn 8/21 >>  Levofloxacin 8/21>> 8/21   Microbiology results: 8/21 BCx: no growth < 12 hours   Thank you for allowing pharmacy to be a part of this patient's care.  Larene Beach, PharmD, BCPS 11/27/2015 9:30 AM

## 2015-11-27 NOTE — Progress Notes (Signed)
SATURATION QUALIFICATIONS: (This note is used to comply with regulatory documentation for home oxygen)    Patient Saturations on Room Air while Ambulating = 93%     

## 2015-11-27 NOTE — Progress Notes (Signed)
Jeffrey [name]: Jeffrey [name]    MR#:  [id number]  DATE OF BIRTH:  05-18-1950  SUBJECTIVE:    No shortness of breath, CP, hemoptysis.  Awaiting ID input.  No other events overnight.   REVIEW OF SYSTEMS:    Review of Systems  Constitutional: Positive for malaise/fatigue. Negative for chills, fever and weight loss.  HENT: Negative for sore throat.   Eyes: Negative for blurred vision, double vision and pain.  Respiratory: Positive for cough and shortness of breath. Negative for hemoptysis, sputum production and wheezing.   Cardiovascular: Positive for chest pain. Negative for palpitations, orthopnea and leg swelling.  Gastrointestinal: Negative for abdominal pain, constipation, diarrhea, heartburn, nausea and vomiting.  Genitourinary: Negative for dysuria and hematuria.  Musculoskeletal: Negative for back pain and joint pain.  Skin: Negative for rash.  Neurological: Positive for weakness. Negative for sensory change, speech change, focal weakness and headaches.  Endo/Heme/Allergies: Does not bruise/bleed easily.  Psychiatric/Behavioral: Negative for depression. The patient is not nervous/anxious.     DRUG ALLERGIES:   Allergies  Allergen Reactions  . Aspirin Nausea Only  . Taxol [Paclitaxel] Other (See Comments)    Diaphoretic, flushing and tightness in chest    VITALS:  Blood pressure 97/62, pulse (!) 118, temperature 98.9 F (37.2 C), temperature source Oral, resp. rate 16, height 5\' 7"  (1.702 m), weight 51.3 kg (113 lb), SpO2 98 %.  PHYSICAL EXAMINATION:   Physical Exam  GENERAL:  65 y.o.-year-old patient lying in the bed in no acute distress.  EYES: Pupils equal, round, reactive to light and accommodation. No scleral icterus. Extraocular muscles intact.  HEENT: Head atraumatic, normocephalic. Oropharynx and nasopharynx clear.  NECK:  Supple, no jugular venous distention. No thyroid enlargement, no tenderness.   LUNGS: Normal breath sounds bilaterally, no wheezing, rales, rhonchi. No use of accessory muscles of respiration.  CARDIOVASCULAR: S1, S2 normal. No murmurs, rubs, or gallops.  ABDOMEN: Soft, nontender, nondistended. Bowel sounds present. No organomegaly or mass.  EXTREMITIES: No cyanosis, clubbing or edema b/l.    NEUROLOGIC: Cranial nerves II through XII are intact. No focal Motor or sensory deficits b/l. Globally weak.    PSYCHIATRIC: The patient is alert and oriented x 3.  SKIN: No obvious rash, lesion, or ulcer.   LABORATORY PANEL:   CBC  Recent Labs Lab 11/27/15 0400  WBC 14.3*  HGB 8.3*  HCT 24.4*  PLT 161   ------------------------------------------------------------------------------------------------------------------ Chemistries   Recent Labs Lab 11/23/15 1640  11/27/15 0400  NA 137  < > 147*  K 4.7  < > 4.1  CL 102  < > 114*  CO2 22  < > 22  GLUCOSE 111*  < > 95  BUN 92*  < > 36*  CREATININE 2.15*  < > 1.56*  CALCIUM 8.9  < > 8.5*  AST 16  --   --   ALT 7*  --   --   ALKPHOS 70  --   --   BILITOT 0.7  --   --   < > = values in this interval not displayed. ------------------------------------------------------------------------------------------------------------------  Cardiac Enzymes  Recent Labs Lab 11/23/15 1640  TROPONINI 0.03*   ------------------------------------------------------------------------------------------------------------------  RADIOLOGY:  No results found.   ASSESSMENT AND PLAN:   [name] history of esophageal cancer on active chemotherapy presenting with cough.  * Right upper lobe cavitary lesion - Abscess or tumor extension, Leukocytosis improving.  - afebrile, hemodynamically stable, No Hemoptysis.  -  MRSA PCR negative and off Vancomycin. - sputum Cx consistent with normal flora. Await further ID input regarding oral (vs) IV abx.  - seen by Pulmonary and as per them Aspiration  pneumonia given hx of of Esophogeal cancer and can be switched to Oral abx possibly Augmentin.  * Sepsis - resolved and now hemodynamically stable.   * Acute on chronic anemia - related to chemotherapy.  - s/p 2 units transfusion and hg. Improved and will monitor.   * AKI over CKD3 - Cr. Improved w/ fluids and blood and will monitor.  - Cr. Close to baseline and will monitor.   * Malnutrition:  - Continue jevity, free water.  * Essential hypertension - BP on low side.  - hold anti-HtN for now.   * Esophageal cancer - Oncology following. Hold chemo given acute illness.  - follow up with them as outpatient.    All the records are reviewed and case discussed with Care Management/Social Workerr. Management plans discussed with the patient, family and they are in agreement.  CODE STATUS: FULL CODE  DVT Prophylaxis: SCDs & TED's  TOTAL CC TIME TAKING CARE OF THIS PATIENT: 63 minutes.   POSSIBLE D/C IN 1-2 DAYS, DEPENDING ON CLINICAL CONDITION.  Henreitta Leber M.D on 11/27/2015 at 1:02 PM  Between 7am to 6pm - Pager - [phone]  After 6pm go to [email or web address] - password [email or web address] [facility]  Office  [phone]  CC: Primary care physician; Adin Hector, MD  Note: This dictation was prepared with Dragon dictation along with smaller phrase technology. Any transcriptional errors that result from this process are unintentional.

## 2015-11-28 LAB — CBC
HCT: 25.5 % — ABNORMAL LOW (ref 40.0–52.0)
Hemoglobin: 8.5 g/dL — ABNORMAL LOW (ref 13.0–18.0)
MCH: 30.3 pg (ref 26.0–34.0)
MCHC: 33.4 g/dL (ref 32.0–36.0)
MCV: 90.7 fL (ref 80.0–100.0)
PLATELETS: 161 10*3/uL (ref 150–440)
RBC: 2.81 MIL/uL — AB (ref 4.40–5.90)
RDW: 18.1 % — AB (ref 11.5–14.5)
WBC: 12.4 10*3/uL — AB (ref 3.8–10.6)

## 2015-11-28 LAB — BASIC METABOLIC PANEL
ANION GAP: 7 (ref 5–15)
BUN: 35 mg/dL — ABNORMAL HIGH (ref 6–20)
CHLORIDE: 110 mmol/L (ref 101–111)
CO2: 27 mmol/L (ref 22–32)
CREATININE: 1.56 mg/dL — AB (ref 0.61–1.24)
Calcium: 8.5 mg/dL — ABNORMAL LOW (ref 8.9–10.3)
GFR calc Af Amer: 53 mL/min — ABNORMAL LOW (ref >60)
GFR calc non Af Amer: 46 mL/min — ABNORMAL LOW (ref >60)
Glucose, Bld: 85 mg/dL (ref 65–99)
POTASSIUM: 4.1 mmol/L (ref 3.5–5.1)
SODIUM: 144 mmol/L (ref 135–145)

## 2015-11-28 LAB — CULTURE, BLOOD (ROUTINE X 2)
CULTURE: NO GROWTH
Culture: NO GROWTH
Culture: NO GROWTH
Culture: NO GROWTH

## 2015-11-28 MED ORDER — SENNOSIDES 8.8 MG/5ML PO SYRP
15.0000 mL | ORAL_SOLUTION | Freq: Two times a day (BID) | ORAL | Status: DC | PRN
  Filled 2015-11-28: qty 15

## 2015-11-28 MED ORDER — IPRATROPIUM-ALBUTEROL 0.5-2.5 (3) MG/3ML IN SOLN
3.0000 mL | Freq: Four times a day (QID) | RESPIRATORY_TRACT | Status: DC
  Administered 2015-11-28 – 2015-11-29 (×5): 3 mL via RESPIRATORY_TRACT
  Filled 2015-11-28 (×5): qty 3

## 2015-11-28 NOTE — Progress Notes (Signed)
[facility] [place] NAME: Jeffrey [name]    MR#:  [id number]  DATE OF BIRTH:  February 06, 1951  SUBJECTIVE:    Feels better.  No other complaints. No hemoptysis.   REVIEW OF SYSTEMS:    Review of Systems  Constitutional: Negative for chills, fever, malaise/fatigue and weight loss.  HENT: Negative for sore throat.   Eyes: Negative for blurred vision, double vision and pain.  Respiratory: Negative for cough, hemoptysis, sputum production, shortness of breath and wheezing.   Cardiovascular: Negative for chest pain, palpitations, orthopnea and leg swelling.  Gastrointestinal: Negative for abdominal pain, constipation, diarrhea, heartburn, nausea and vomiting.  Genitourinary: Negative for dysuria and hematuria.  Musculoskeletal: Negative for back pain and joint pain.  Skin: Negative for rash.  Neurological: Negative for sensory change, speech change, focal weakness, weakness and headaches.  Endo/Heme/Allergies: Does not bruise/bleed easily.  Psychiatric/Behavioral: Negative for depression. The patient is not nervous/anxious.     DRUG ALLERGIES:   Allergies  Allergen Reactions  . Aspirin Nausea Only  . Taxol [Paclitaxel] Other (See Comments)    Diaphoretic, flushing and tightness in chest    VITALS:  Blood pressure 101/64, pulse (!) 102, temperature 98.7 F (37.1 C), temperature source Oral, resp. rate 16, height 5\' 7"  (1.702 m), weight 51.3 kg (113 lb), SpO2 97 %.  PHYSICAL EXAMINATION:   Physical Exam  GENERAL:  65 y.o.-year-old patient lying in the bed in no acute distress.  EYES: Pupils equal, round, reactive to light and accommodation. No scleral icterus. Extraocular muscles intact.  HEENT: Head atraumatic, normocephalic. Oropharynx and nasopharynx clear.  NECK:  Supple, no jugular venous distention. No thyroid enlargement, no tenderness.  LUNGS: Normal breath sounds bilaterally, no wheezing, rales, rhonchi. No use of  accessory muscles of respiration.  CARDIOVASCULAR: S1, S2 normal. No murmurs, rubs, or gallops.  ABDOMEN: Soft, nontender, nondistended. Bowel sounds present. No organomegaly or mass.  EXTREMITIES: No cyanosis, clubbing or edema b/l.    NEUROLOGIC: Cranial nerves II through XII are intact. No focal Motor or sensory deficits b/l.     PSYCHIATRIC: The patient is alert and oriented x 3.  SKIN: No obvious rash, lesion, or ulcer.   LABORATORY PANEL:   CBC  Recent Labs Lab 11/28/15 0637  WBC 12.4*  HGB 8.5*  HCT 25.5*  PLT 161   ------------------------------------------------------------------------------------------------------------------ Chemistries   Recent Labs Lab 11/23/15 1640  11/28/15 0637  NA 137  < > 144  K 4.7  < > 4.1  CL 102  < > 110  CO2 22  < > 27  GLUCOSE 111*  < > 85  BUN 92*  < > 35*  CREATININE 2.15*  < > 1.56*  CALCIUM 8.9  < > 8.5*  AST 16  --   --   ALT 7*  --   --   ALKPHOS 70  --   --   BILITOT 0.7  --   --   < > = values in this interval not displayed. ------------------------------------------------------------------------------------------------------------------  Cardiac Enzymes  Recent Labs Lab 11/23/15 1640  TROPONINI 0.03*   ------------------------------------------------------------------------------------------------------------------  RADIOLOGY:  No results found.   ASSESSMENT AND PLAN:   [name] history of esophageal cancer on active chemotherapy presenting with cough.  * Right upper lobe cavitary lesion - seen by ID and this is aspiration pneumonia and not likely malignancy.  - afebrile, hemodynamically stable, No Hemoptysis.  - sputum Cx consistent with normal flora. Appreciate ID input  and will keep on IV abx for 2 more days and discharge on Oral Augmentin for 3 more weeks.  - repeat CXR tomorrow.  - appreciate both ID and Pulmonary input.   * Sepsis - resolved and now hemodynamically  stable.   * Acute on chronic anemia - related to chemotherapy.  - s/p 2 units transfusion and hg. Improved and stable.   * AKI over CKD3 - Cr. Improved w/ fluids and blood and will monitor.  - Cr. Close to baseline and will monitor.   * Malnutrition:  - Continue jevity, free water.  * Essential hypertension - BP on low side.  - cont. To  hold anti-HtN for now.   * Esophageal cancer - Oncology following. Hold chemo given acute illness.  - follow up with them as outpatient.   Likely d/c home on Monday on Oral abx.   All the records are reviewed and case discussed with Care Management/Social Workerr. Management plans discussed with the patient, family and they are in agreement.  CODE STATUS: FULL CODE  DVT Prophylaxis: SCDs & TED's  TOTAL CC TIME TAKING CARE OF THIS PATIENT: 63 minutes.   POSSIBLE D/C IN 1-2 DAYS, DEPENDING ON CLINICAL CONDITION.  Henreitta Leber M.D on 11/28/2015 at 11:57 AM  Between 7am to 6pm - Pager - [phone]  After 6pm go to [email or web address] - password [email or web address] [facility]  Office  [phone]  CC: Primary care physician; Adin Hector, MD  Note: This dictation was prepared with Dragon dictation along with smaller phrase technology. Any transcriptional errors that result from this process are unintentional.

## 2015-11-29 MED ORDER — IPRATROPIUM-ALBUTEROL 0.5-2.5 (3) MG/3ML IN SOLN
3.0000 mL | Freq: Three times a day (TID) | RESPIRATORY_TRACT | Status: DC
  Administered 2015-11-29: 20:00:00 3 mL via RESPIRATORY_TRACT
  Filled 2015-11-29 (×2): qty 3

## 2015-11-29 NOTE — Progress Notes (Signed)
[place] [facility] [place] NAME: Garwin Stadelman    MR#:  [id number]  DATE OF BIRTH:  1950-10-24  SUBJECTIVE:    Still has a cough but no hemoptysis.  Feels overall much better since admission.  Will d/c home tomorrow on Oral abx.   REVIEW OF SYSTEMS:    Review of Systems  Constitutional: Negative for chills, fever, malaise/fatigue and weight loss.  HENT: Negative for sore throat.   Eyes: Negative for blurred vision, double vision and pain.  Respiratory: Negative for cough, hemoptysis, sputum production, shortness of breath and wheezing.   Cardiovascular: Negative for chest pain, palpitations, orthopnea and leg swelling.  Gastrointestinal: Negative for abdominal pain, constipation, diarrhea, heartburn, nausea and vomiting.  Genitourinary: Negative for dysuria and hematuria.  Musculoskeletal: Negative for back pain and joint pain.  Skin: Negative for rash.  Neurological: Negative for sensory change, speech change, focal weakness, weakness and headaches.  Endo/Heme/Allergies: Does not bruise/bleed easily.  Psychiatric/Behavioral: Negative for depression. The patient is not nervous/anxious.     DRUG ALLERGIES:   Allergies  Allergen Reactions  . Aspirin Nausea Only  . Taxol [Paclitaxel] Other (See Comments)    Diaphoretic, flushing and tightness in chest    VITALS:  Blood pressure (!) 90/54, pulse (!) 105, temperature 98.5 F (36.9 C), temperature source Oral, resp. rate 16, height 5\' 7"  (1.702 m), weight 51.3 kg (113 lb), SpO2 98 %.  PHYSICAL EXAMINATION:   Physical Exam  GENERAL:  65 y.o.-year-old patient lying in the bed in no acute distress.  EYES: Pupils equal, round, reactive to light and accommodation. No scleral icterus. Extraocular muscles intact.  HEENT: Head atraumatic, normocephalic. Oropharynx and nasopharynx clear.  NECK:  Supple, no jugular venous distention. No thyroid enlargement, no tenderness.  LUNGS: Normal  breath sounds bilaterally, no wheezing, rales, rhonchi. No use of accessory muscles of respiration.  CARDIOVASCULAR: S1, S2 normal. No murmurs, rubs, or gallops.  ABDOMEN: Soft, nontender, nondistended. Bowel sounds present. No organomegaly or mass.  EXTREMITIES: No cyanosis, clubbing or edema b/l.    NEUROLOGIC: Cranial nerves II through XII are intact. No focal Motor or sensory deficits b/l.     PSYCHIATRIC: The patient is alert and oriented x 3.  SKIN: No obvious rash, lesion, or ulcer.   LABORATORY PANEL:   CBC  Recent Labs Lab 11/28/15 0637  WBC 12.4*  HGB 8.5*  HCT 25.5*  PLT 161   ------------------------------------------------------------------------------------------------------------------ Chemistries   Recent Labs Lab 11/23/15 1640  11/28/15 0637  NA 137  < > 144  K 4.7  < > 4.1  CL 102  < > 110  CO2 22  < > 27  GLUCOSE 111*  < > 85  BUN 92*  < > 35*  CREATININE 2.15*  < > 1.56*  CALCIUM 8.9  < > 8.5*  AST 16  --   --   ALT 7*  --   --   ALKPHOS 70  --   --   BILITOT 0.7  --   --   < > = values in this interval not displayed. ------------------------------------------------------------------------------------------------------------------  Cardiac Enzymes  Recent Labs Lab 11/23/15 1640  TROPONINI 0.03*   ------------------------------------------------------------------------------------------------------------------  RADIOLOGY:  No results found.   ASSESSMENT AND PLAN:   [name] history of esophageal cancer on active chemotherapy presenting with cough.  * Right upper lobe cavitary lesion - seen by ID and this is aspiration cavitary pneumonia and not likely malignancy.  -  afebrile, hemodynamically stable, No Hemoptysis. Still has a cough. - sputum Cx consistent with normal flora. Appreciate ID input and will keep on IV abx until tomorrow and discharge on Oral Augmentin for 3 more weeks. Repeat CXR in a.m.  Tomorrow.  - appreciate both ID and Pulmonary input.   * Sepsis - resolved and now hemodynamically stable.   * Acute on chronic anemia - related to chemotherapy.  - s/p 2 units transfusion and hg. Improved and stable.   * AKI over CKD3 - Cr. Improved w/ fluids and blood and will monitor.  - Cr. Close to baseline and will monitor.   * Malnutrition:  - Continue jevity, free water.  * Essential hypertension - BP remains on low side.  -  Will likely d/c anti-HTN upon discharge as he probably does not need them.    * Esophageal cancer - Oncology following. Hold chemo given acute illness.  - follow up with them as outpatient.   D/c home tomorrow on Oral abx.    All the records are reviewed and case discussed with Care Management/Social Workerr. Management plans discussed with the patient, family and they are in agreement.  CODE STATUS: FULL CODE  DVT Prophylaxis: SCDs & TED's  TOTAL CC TIME TAKING CARE OF THIS PATIENT: 30 minutes.   POSSIBLE D/C IN 1-2 DAYS, DEPENDING ON CLINICAL CONDITION.  Henreitta Leber M.D on 11/29/2015 at 12:29 PM  Between 7am to 6pm - Pager - [phone]  After 6pm go to [email or web address] - password [email or web address] [facility]  Office  [phone]  CC: Primary care physician; Adin Hector, MD  Note: This dictation was prepared with Dragon dictation along with smaller phrase technology. Any transcriptional errors that result from this process are unintentional.

## 2015-11-30 ENCOUNTER — Inpatient Hospital Stay: Payer: Self-pay

## 2015-11-30 MED ORDER — AMOXICILLIN-POT CLAVULANATE 400-57 MG/5ML PO SUSR: 10.0000 mL | Freq: Two times a day (BID) | ORAL | 3 refills | Status: AC

## 2015-11-30 NOTE — Discharge Summary (Signed)
[place] [facility] [place] NAME: Jeffrey [name]    MR#:  [id number]  DATE OF BIRTH:  04/13/1950  DATE OF ADMISSION:  11/23/2015 ADMITTING PHYSICIAN: Harvie Bridge, DO  DATE OF DISCHARGE: 11/30/2015  PRIMARY CARE PHYSICIAN: Tama High III, MD    ADMISSION DIAGNOSIS:  Healthcare-associated pneumonia [J18.9] Sepsis, due to unspecified organism ([place]) [A41.9]  DISCHARGE DIAGNOSIS:  Active Problems:   Sepsis ([place])   SECONDARY DIAGNOSIS:   Past Medical History:  Diagnosis Date  . Bladder cancer [facility])    s/p surgery and reconstruction  . Chronic kidney disease    stage 3  . Emphysema of lung ([place])   . Esophageal cancer ([place])   . Hypertension     HOSPITAL COURSE:   [name] history of esophageal cancer on active chemotherapy presenting with cough.  * Right upper lobe cavitary lesion - Pt. Was admitted to the hospital and started on broad spectrum IV abx w/ Vancomycin, Zosyn. Since MRSA screen was negative the vancomycin was discontinued. -Patient was maintained on IV Zosyn. The pulmonary and also infectious disease consult was obtained. -As per infectious disease this is likely a cavitary pneumonia rather than malignancy. Patient was treated with 7 days of IV antibiotics on the hospital and not being discharged on oral Augmentin for another 3 weeks. -He will have repeat imaging in the next few weeks after finishing his antibiotic course. He will follow up with infectious disease and also pulmonary as an outpatient.   * Sepsis - This was secondary to the pneumonia and it has improved and resolved w/ IV antibiotics.    * Acute on chronic anemia - related to chemotherapy.  - s/p 2 units transfusion and hg.has Improved and stable presently. - this can be further followed up at the cancer center.  * AKI over CKD3 - this was ATN secondary to sepsis.  Pt. Was given IV fluids and Cr. Has improved and currently  back to baseline.   * Malnutrition:  - pt. Has a PEG tube and will cont. His tube feeds.    * Essential hypertension - BP remains on low side.  - I have discontinued his Norvasc upon discharge.    * Esophageal cancer - pt. Is followed at the [facility] w/ Dr. Rogue Bussing.  - he will cont. Follow up with them as outpatient.   DISCHARGE CONDITIONS:   Stable.   CONSULTS OBTAINED:  Treatment Team:  Lytle Butte, MD Cammie Sickle, MD Laverle Hobby, MD Leonel Ramsay, MD  DRUG ALLERGIES:   Allergies  Allergen Reactions  . Aspirin Nausea Only  . Taxol [Paclitaxel] Other (See Comments)    Diaphoretic, flushing and tightness in chest    DISCHARGE MEDICATIONS:     Medication List    STOP taking these medications   amLODipine 10 MG tablet Commonly known as:  NORVASC   amoxicillin-clavulanate 875-125 MG tablet Commonly known as:  AUGMENTIN Replaced by:  amoxicillin-clavulanate 400-57 MG/5ML suspension     TAKE these medications   amoxicillin-clavulanate 400-57 MG/5ML suspension Commonly known as:  AUGMENTIN Place 10 mLs into feeding tube 2 (two) times daily. Replaces:  amoxicillin-clavulanate 875-125 MG tablet   diphenoxylate-atropine 2.5-0.025 MG tablet Commonly known as:  LOMOTIL Take 1 tablet by mouth 4 (four) times daily as needed for diarrhea or loose stools. Also take 1 pills 30-60 minutes prior to starting chemotherapy/week cycle.   feeding supplement (JEVITY 1.5 CAL/FIBER) Liqd Place 237 mLs into  feeding tube 6 (six) times daily.   lidocaine-prilocaine cream Commonly known as:  EMLA Apply cream 1 hour before chemotherapy treatment, place a small amount of saran wrap over the cream to protect your clothing   neomycin-bacitracin-polymyxin Oint Commonly known as:  NEOSPORIN Apply 1 application topically daily.   ondansetron 8 MG tablet Commonly known as:  ZOFRAN Take 1 tablet (8 mg total) by mouth every 8 (eight) hours as needed for  nausea or vomiting (start 3 days; after chemo).   oxyCODONE 5 MG/5ML solution Commonly known as:  ROXICODONE Place 5 mLs (5 mg total) into feeding tube every 8 (eight) hours as needed for severe pain.   prochlorperazine 10 MG tablet Commonly known as:  COMPAZINE Take 1 tablet (10 mg total) by mouth every 6 (six) hours as needed for nausea or vomiting.         DISCHARGE INSTRUCTIONS:   DIET:  Tube feeds.   DISCHARGE CONDITION:  Stable  ACTIVITY:  Activity as tolerated  OXYGEN:  Home Oxygen: No.   Oxygen Delivery: room air  DISCHARGE LOCATION:  home   If you experience worsening of your admission symptoms, develop shortness of breath, life threatening emergency, suicidal or homicidal thoughts you must seek medical attention immediately by calling 911 or calling your MD immediately  if symptoms less severe.  You Must read complete instructions/literature along with all the possible adverse reactions/side effects for all the Medicines you take and that have been prescribed to you. Take any new Medicines after you have completely understood and accpet all the possible adverse reactions/side effects.   Please note  You were cared for by a hospitalist during your hospital stay. If you have any questions about your discharge medications or the care you received while you were in the hospital after you are discharged, you can call the unit and asked to speak with the hospitalist on call if the hospitalist that took care of you is not available. Once you are discharged, your primary care physician will handle any further medical issues. Please note that NO REFILLS for any discharge medications will be authorized once you are discharged, as it is imperative that you return to your primary care physician (or establish a relationship with a primary care physician if you do not have one) for your aftercare needs so that they can reassess your need for medications and monitor your lab  values.     Today   Still has some cough but no hemoptysis.  Afebrile.  No other complaints.  Tolerating tube feeds well.    VITAL SIGNS:  Blood pressure 96/60, pulse (!) 114, temperature 98.2 F (36.8 C), temperature source Oral, resp. rate 20, height 5\' 7"  (1.702 m), weight 51.3 kg (113 lb), SpO2 94 %.  I/O:   Intake/Output Summary (Last 24 hours) at 11/30/15 1311 Last data filed at 11/30/15 1051  Gross per 24 hour  Intake              475 ml  Output              900 ml  Net             -425 ml    PHYSICAL EXAMINATION:  GENERAL:  65 y.o.-year-old patient lying in the bed with no acute distress.  EYES: Pupils equal, round, reactive to light and accommodation. No scleral icterus. Extraocular muscles intact.  HEENT: Head atraumatic, normocephalic. Oropharynx and nasopharynx clear.  NECK:  Supple, no jugular venous distention. No  thyroid enlargement, no tenderness.  LUNGS: Normal breath sounds bilaterally, minimal rhonchi on right lung base, no wheezing, rales. No use of accessory muscles of respiration.  CARDIOVASCULAR: S1, S2 normal. No murmurs, rubs, or gallops.  ABDOMEN: Soft, non-tender, non-distended. Bowel sounds present. No organomegaly or mass.  EXTREMITIES: No pedal edema, cyanosis, or clubbing.  NEUROLOGIC: Cranial nerves II through XII are intact. No focal motor or sensory defecits b/l.  PSYCHIATRIC: The patient is alert and oriented x 3. Good affect.  SKIN: No obvious rash, lesion, or ulcer.   DATA REVIEW:   CBC  Recent Labs Lab 11/28/15 0637  WBC 12.4*  HGB 8.5*  HCT 25.5*  PLT 161    Chemistries   Recent Labs Lab 11/23/15 1640  11/28/15 0637  NA 137  < > 144  K 4.7  < > 4.1  CL 102  < > 110  CO2 22  < > 27  GLUCOSE 111*  < > 85  BUN 92*  < > 35*  CREATININE 2.15*  < > 1.56*  CALCIUM 8.9  < > 8.5*  AST 16  --   --   ALT 7*  --   --   ALKPHOS 70  --   --   BILITOT 0.7  --   --   < > = values in this interval not displayed.  Cardiac  Enzymes  Recent Labs Lab 11/23/15 1640  TROPONINI 0.03*    Microbiology Results  Results for orders placed or performed during the hospital encounter of 11/23/15  Blood Culture (routine x 2)     Status: None   Collection Time: 11/23/15  4:41 PM  Result Value Ref Range Status   Specimen Description BLOOD L FOREMARM  Final   Special Requests BACTERIAL CASTS 3ML  Final   Culture NO GROWTH 5 DAYS  Final   Report Status 11/28/2015 FINAL  Final  Blood Culture (routine x 2)     Status: None   Collection Time: 11/23/15  4:45 PM  Result Value Ref Range Status   Specimen Description BLOOD PORT RCHEST  Final   Special Requests BACTERIAL CASTS 7ML  Final   Culture NO GROWTH 5 DAYS  Final   Report Status 11/28/2015 FINAL  Final  Culture, expectorated sputum-assessment     Status: None   Collection Time: 11/24/15  5:34 AM  Result Value Ref Range Status   Specimen Description EXPECTORATED SPUTUM  Final   Special Requests NONE  Final   Sputum evaluation THIS SPECIMEN IS ACCEPTABLE FOR SPUTUM CULTURE  Final   Report Status 11/24/2015 FINAL  Final  Culture, respiratory (NON-Expectorated)     Status: None   Collection Time: 11/24/15  5:34 AM  Result Value Ref Range Status   Specimen Description EXPECTORATED SPUTUM  Final   Special Requests NONE Reflexed from [id number]  Final   Gram Stain   Final    ABUNDANT WBC PRESENT, PREDOMINANTLY PMN RARE SQUAMOUS EPITHELIAL CELLS PRESENT ABUNDANT GRAM POSITIVE COCCI IN PAIRS FEW GRAM NEGATIVE COCCOBACILLI RARE GRAM POSITIVE RODS    Culture   Final    Consistent with normal respiratory flora. Performed at [facility]    Report Status 11/26/2015 FINAL  Final  MRSA PCR Screening     Status: None   Collection Time: 11/24/15  3:00 PM  Result Value Ref Range Status   MRSA by PCR NEGATIVE NEGATIVE Final    Comment:        The GeneXpert MRSA Assay (FDA approved  for NASAL specimens only), is one component of a comprehensive MRSA  colonization surveillance program. It is not intended to diagnose MRSA infection nor to guide or monitor treatment for MRSA infections.     RADIOLOGY:  Dg Chest 2 View  Result Date: 11/30/2015 CLINICAL DATA:  Follow up pneumonia. Persistent cough. History of bladder cancer and esophageal cancer. EXAM: CHEST  2 VIEW COMPARISON:  Radiographs 11/23/2015. Chest CT 11/24/2015. PET CT 09/25/2015. FINDINGS: Right IJ Port-A-Cath tip is unchanged at the SVC right atrial junction. The heart size and mediastinal contours are stable. There is persistent cavitary airspace disease posteromedially in the right hemithorax, involving the upper and lower lobes on CT. The surrounding airspace disease in the right upper lobe may be slightly progressive. Patchy airspace opacities at both lung bases are unchanged. There is no pleural effusion or pneumothorax. Percutaneous G-tube noted. IMPRESSION: Little change in cavitary airspace disease in the right lung compared with prior studies from last week. Findings could reflect cavitary pneumonia. However, if the patient has received radiation therapy for esophageal cancer, radiation pneumonitis/ necrosis may be contributory. Electronically Signed   By: Richardean Sale M.D.   On: 11/30/2015 09:42      Management plans discussed with the patient, family and they are in agreement.  CODE STATUS:     Code Status Orders        Start     Ordered   11/23/15 1721  Full code  Continuous     11/23/15 1721    Code Status History    Date Active Date Inactive Code Status Order ID Comments User Context   11/11/2015  4:27 PM 11/12/2015  3:15 AM Full Code [id number]  Sabino Dick, MD HOV   05/20/2015  5:06 PM 05/23/2015  2:02 PM Full Code [id number]  Gladstone Lighter, MD Inpatient   01/20/2015  2:35 PM 01/24/2015  5:16 PM Full Code [id number]  Fritzi Mandes, MD Inpatient      TOTAL TIME TAKING CARE OF THIS PATIENT: 40 minutes.    Henreitta Leber M.D on 11/30/2015 at 1:11  PM  Between 7am to 6pm - Pager - [phone]  After 6pm go to [email or web address] - Proofreader  [facility]  Office  [phone]  CC: Primary care physician; Adin Hector, MD

## 2015-11-30 NOTE — Progress Notes (Signed)
[place] INFECTIOUS DISEASE PROGRESS NOTE Date of Admission:  11/23/2015     ID: Jeffrey [name] is a 65 y.o. male with aspiration PNA Active Problems:   Sepsis ([place])   Subjective: Doing better, oob, no fevers, still some cough but improving  ROS  Eleven systems are reviewed and negative except per hpi  Medications:  Antibiotics Given (last 72 hours)    Date/Time Action Medication Dose Rate   11/27/15 1535 Given   piperacillin-tazobactam (ZOSYN) IVPB 3.375 g 3.375 g 12.5 mL/hr   11/28/15 0044 Given   piperacillin-tazobactam (ZOSYN) IVPB 3.375 g 3.375 g 12.5 mL/hr   11/28/15 1029 Given   piperacillin-tazobactam (ZOSYN) IVPB 3.375 g 3.375 g 12.5 mL/hr   11/28/15 1746 Given   piperacillin-tazobactam (ZOSYN) IVPB 3.375 g 3.375 g 12.5 mL/hr   11/29/15 0139 Given   piperacillin-tazobactam (ZOSYN) IVPB 3.375 g 3.375 g 12.5 mL/hr   11/29/15 0753 Given   piperacillin-tazobactam (ZOSYN) IVPB 3.375 g 3.375 g 12.5 mL/hr   11/29/15 1607 Given   piperacillin-tazobactam (ZOSYN) IVPB 3.375 g 3.375 g 12.5 mL/hr   11/30/15 0130 Given   piperacillin-tazobactam (ZOSYN) IVPB 3.375 g 3.375 g 12.5 mL/hr   11/30/15 0732 Given   piperacillin-tazobactam (ZOSYN) IVPB 3.375 g 3.375 g 12.5 mL/hr     . antiseptic oral rinse  7 mL Mouth Rinse q12n4p  . chlorpheniramine-HYDROcodone  5 mL Per Tube Q12H  . feeding supplement (JEVITY 1.5 CAL/FIBER)  237 mL Per Tube 6 X Daily  . free water  200 mL Per Tube Q8H  . heparin subcutaneous  5,000 Units Subcutaneous Q8H  . ipratropium-albuterol  3 mL Nebulization TID  . piperacillin-tazobactam (ZOSYN)  IV  3.375 g Intravenous Q8H  . TRIPLE ANTIBIOTIC  1 application Topical Daily    Objective: Vital signs in last 24 hours: Temp:  [98 F (36.7 C)-99.3 F (37.4 C)] 98.2 F (36.8 C) (08/28 0446) Pulse Rate:  [114-120] 114 (08/28 0446) Resp:  [20] 20 (08/27 1953) BP: (96-101)/(55-60) 96/60 (08/28 0446) SpO2:  [94 %-99 %] 94 % (08/28  0446) Constitutional: He is oriented to person, place, and time. Cachectic HENT:  Mouth/Throat: Oropharynx is clear and dry. No oropharyngeal exudate.  Cardiovascular: Normal rate, regular rhythm and normal heart sounds.Pulmonary/Chest:R base rub and rhonchi Abdominal: Soft. Bowel sounds are normal. He exhibits no distension. Peg in place Lymphadenopathy: He has marble sized firm L neck node Neurological: He is alert and oriented to person, place, and time.  Skin: Skin is warm and dry. No rash noted. No erythema.  Psychiatric: He has a normal mood and affect. His behavior is normal.     Lab Results  Recent Labs  11/28/15 0637  WBC 12.4*  HGB 8.5*  HCT 25.5*  NA 144  K 4.1  CL 110  CO2 27  BUN 35*  CREATININE 1.56*    Microbiology: Results for orders placed or performed during the hospital encounter of 11/23/15  Blood Culture (routine x 2)     Status: None   Collection Time: 11/23/15  4:41 PM  Result Value Ref Range Status   Specimen Description BLOOD L [facility]  Final   Special Requests BACTERIAL CASTS 3ML  Final   Culture NO GROWTH 5 DAYS  Final   Report Status 11/28/2015 FINAL  Final  Blood Culture (routine x 2)     Status: None   Collection Time: 11/23/15  4:45 PM  Result Value Ref Range Status   Specimen Description BLOOD PORT RCHEST  Final  Special Requests BACTERIAL CASTS 7ML  Final   Culture NO GROWTH 5 DAYS  Final   Report Status 11/28/2015 FINAL  Final  Culture, expectorated sputum-assessment     Status: None   Collection Time: 11/24/15  5:34 AM  Result Value Ref Range Status   Specimen Description EXPECTORATED SPUTUM  Final   Special Requests NONE  Final   Sputum evaluation THIS SPECIMEN IS ACCEPTABLE FOR SPUTUM CULTURE  Final   Report Status 11/24/2015 FINAL  Final  Culture, respiratory (NON-Expectorated)     Status: None   Collection Time: 11/24/15  5:34 AM  Result Value Ref Range Status   Specimen Description EXPECTORATED SPUTUM  Final   Special  Requests NONE Reflexed from [id number]  Final   Gram Stain   Final    ABUNDANT WBC PRESENT, PREDOMINANTLY PMN RARE SQUAMOUS EPITHELIAL CELLS PRESENT ABUNDANT GRAM POSITIVE COCCI IN PAIRS FEW GRAM NEGATIVE COCCOBACILLI RARE GRAM POSITIVE RODS    Culture   Final    Consistent with normal respiratory flora. Performed at [facility]    Report Status 11/26/2015 FINAL  Final  MRSA PCR Screening     Status: None   Collection Time: 11/24/15  3:00 PM  Result Value Ref Range Status   MRSA by PCR NEGATIVE NEGATIVE Final    Comment:        The GeneXpert MRSA Assay (FDA approved for NASAL specimens only), is one component of a comprehensive MRSA colonization surveillance program. It is not intended to diagnose MRSA infection nor to guide or monitor treatment for MRSA infections.     Studies/Results: Dg Chest 2 View  Result Date: 11/30/2015 CLINICAL DATA:  Follow up pneumonia. Persistent cough. History of bladder cancer and esophageal cancer. EXAM: CHEST  2 VIEW COMPARISON:  Radiographs 11/23/2015. Chest CT 11/24/2015. PET CT 09/25/2015. FINDINGS: Right IJ Port-A-Cath tip is unchanged at the SVC right atrial junction. The heart size and mediastinal contours are stable. There is persistent cavitary airspace disease posteromedially in the right hemithorax, involving the upper and lower lobes on CT. The surrounding airspace disease in the right upper lobe may be slightly progressive. Patchy airspace opacities at both lung bases are unchanged. There is no pleural effusion or pneumothorax. Percutaneous G-tube noted. IMPRESSION: Little change in cavitary airspace disease in the right lung compared with prior studies from last week. Findings could reflect cavitary pneumonia. However, if the patient has received radiation therapy for esophageal cancer, radiation pneumonitis/ necrosis may be contributory. Electronically Signed   By: Richardean Sale M.D.   On: 11/30/2015 09:42     Assessment/Plan: Jeffrey [name] is a 65 y.o. male with esophageal malignancy, with PEG and Esophageal dysmotility admitted with cough and fever. Has large necrotic PNA likely aspiration. Cx with oral flora. Some response to zosyn. Has portacath in place. I think he could be treated with Augmentin as an outpatient 875 bid for at least 3 weeks. Since he has a portacath could use zosyn as outpatient but really do not see a great need for this since no resistant organisms identified.  CXR shows no change, clinically improving, cx negative  Recommendations Since improving can dc on augmentin 875 per GT for 3 more weeks with out pt fu I can see in 2-3 weeks if needed but can also fu with onc if needed Thank you very much for the consult. Will follow with you.  Gray Maugeri P   11/30/2015, 2:23 PM

## 2015-11-30 NOTE — Progress Notes (Signed)
Pt's ride present for discharge; pt discharged via wheelchair by nursing to the visitor's entrance 

## 2015-11-30 NOTE — Progress Notes (Signed)
MD order received in [facility] to discharge pt home today; verbally reviewed AVS with pt including medications/gave Rx for Augmentin to pt; Dr Blane Ohara office will be calling him to set up follow up appointment; no questions voiced at this time; per previous telephone call with pt's spouse, Hassan Rowan; pt's spouse will get off of work at 1430; discharge pending until she arrives

## 2015-12-01 ENCOUNTER — Other Ambulatory Visit: Payer: Self-pay | Admitting: *Deleted

## 2015-12-01 DIAGNOSIS — C159 Malignant neoplasm of esophagus, unspecified: Secondary | ICD-10-CM

## 2015-12-04 ENCOUNTER — Telehealth: Payer: Self-pay | Admitting: *Deleted

## 2015-12-04 ENCOUNTER — Inpatient Hospital Stay: Payer: Self-pay

## 2015-12-04 ENCOUNTER — Inpatient Hospital Stay (HOSPITAL_BASED_OUTPATIENT_CLINIC_OR_DEPARTMENT_OTHER): Payer: Self-pay | Admitting: Internal Medicine

## 2015-12-04 ENCOUNTER — Inpatient Hospital Stay: Payer: Self-pay | Attending: Internal Medicine

## 2015-12-04 DIAGNOSIS — Z931 Gastrostomy status: Secondary | ICD-10-CM | POA: Insufficient documentation

## 2015-12-04 DIAGNOSIS — G893 Neoplasm related pain (acute) (chronic): Secondary | ICD-10-CM | POA: Insufficient documentation

## 2015-12-04 DIAGNOSIS — N183 Chronic kidney disease, stage 3 (moderate): Secondary | ICD-10-CM

## 2015-12-04 DIAGNOSIS — J690 Pneumonitis due to inhalation of food and vomit: Secondary | ICD-10-CM | POA: Insufficient documentation

## 2015-12-04 DIAGNOSIS — Z803 Family history of malignant neoplasm of breast: Secondary | ICD-10-CM | POA: Insufficient documentation

## 2015-12-04 DIAGNOSIS — Z923 Personal history of irradiation: Secondary | ICD-10-CM | POA: Insufficient documentation

## 2015-12-04 DIAGNOSIS — Z8551 Personal history of malignant neoplasm of bladder: Secondary | ICD-10-CM

## 2015-12-04 DIAGNOSIS — C159 Malignant neoplasm of esophagus, unspecified: Secondary | ICD-10-CM

## 2015-12-04 DIAGNOSIS — E46 Unspecified protein-calorie malnutrition: Secondary | ICD-10-CM

## 2015-12-04 DIAGNOSIS — I129 Hypertensive chronic kidney disease with stage 1 through stage 4 chronic kidney disease, or unspecified chronic kidney disease: Secondary | ICD-10-CM

## 2015-12-04 DIAGNOSIS — Z79899 Other long term (current) drug therapy: Secondary | ICD-10-CM

## 2015-12-04 DIAGNOSIS — Z5111 Encounter for antineoplastic chemotherapy: Secondary | ICD-10-CM | POA: Insufficient documentation

## 2015-12-04 DIAGNOSIS — J439 Emphysema, unspecified: Secondary | ICD-10-CM | POA: Insufficient documentation

## 2015-12-04 DIAGNOSIS — Z9221 Personal history of antineoplastic chemotherapy: Secondary | ICD-10-CM | POA: Insufficient documentation

## 2015-12-04 DIAGNOSIS — R634 Abnormal weight loss: Secondary | ICD-10-CM

## 2015-12-04 DIAGNOSIS — F17210 Nicotine dependence, cigarettes, uncomplicated: Secondary | ICD-10-CM | POA: Insufficient documentation

## 2015-12-04 DIAGNOSIS — C154 Malignant neoplasm of middle third of esophagus: Secondary | ICD-10-CM | POA: Insufficient documentation

## 2015-12-04 LAB — COMPREHENSIVE METABOLIC PANEL
ALT: 10 U/L — ABNORMAL LOW (ref 17–63)
ANION GAP: 9 (ref 5–15)
AST: 14 U/L — ABNORMAL LOW (ref 15–41)
Albumin: 2.4 g/dL — ABNORMAL LOW (ref 3.5–5.0)
Alkaline Phosphatase: 60 U/L (ref 38–126)
BUN: 34 mg/dL — ABNORMAL HIGH (ref 6–20)
CHLORIDE: 100 mmol/L — AB (ref 101–111)
CO2: 25 mmol/L (ref 22–32)
CREATININE: 1.6 mg/dL — AB (ref 0.61–1.24)
Calcium: 9 mg/dL (ref 8.9–10.3)
GFR, EST AFRICAN AMERICAN: 51 mL/min — AB (ref >60)
GFR, EST NON AFRICAN AMERICAN: 44 mL/min — AB (ref >60)
Glucose, Bld: 108 mg/dL — ABNORMAL HIGH (ref 65–99)
POTASSIUM: 4.5 mmol/L (ref 3.5–5.1)
SODIUM: 134 mmol/L — AB (ref 135–145)
Total Bilirubin: 0.3 mg/dL (ref 0.3–1.2)
Total Protein: 7.9 g/dL (ref 6.5–8.1)

## 2015-12-04 LAB — CBC WITH DIFFERENTIAL/PLATELET
Basophils Absolute: 0 10*3/uL (ref 0–0.1)
Basophils Relative: 0 %
EOS ABS: 0 10*3/uL (ref 0–0.7)
EOS PCT: 0 %
HCT: 28.1 % — ABNORMAL LOW (ref 40.0–52.0)
Hemoglobin: 9.4 g/dL — ABNORMAL LOW (ref 13.0–18.0)
LYMPHS ABS: 1.6 10*3/uL (ref 1.0–3.6)
LYMPHS PCT: 12 %
MCH: 29.8 pg (ref 26.0–34.0)
MCHC: 33.3 g/dL (ref 32.0–36.0)
MCV: 89.4 fL (ref 80.0–100.0)
MONO ABS: 1.5 10*3/uL — AB (ref 0.2–1.0)
Monocytes Relative: 12 %
Neutro Abs: 9.7 10*3/uL — ABNORMAL HIGH (ref 1.4–6.5)
Neutrophils Relative %: 76 %
PLATELETS: 215 10*3/uL (ref 150–440)
RBC: 3.15 MIL/uL — ABNORMAL LOW (ref 4.40–5.90)
RDW: 17.6 % — AB (ref 11.5–14.5)
WBC: 12.9 10*3/uL — AB (ref 3.8–10.6)

## 2015-12-04 LAB — SAMPLE TO BLOOD BANK

## 2015-12-04 MED ORDER — OXYCODONE HCL 5 MG/5ML PO SOLN: 5.0000 mg | Freq: Four times a day (QID) | ORAL | 0 refills | Status: DC | PRN

## 2015-12-04 NOTE — Assessment & Plan Note (Signed)
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 Status post cycle # 2. No significant clinical response is noted- left side neck lymph node stable.  HOLD chemo for now sec to recent pneumonia.  # RUL Pneumonia/aspiration- on augumentin BID; awaiting Dr.Fitzgerald follow up on 18th.   # pain - continue oxycodone liquid. Stable.   #  Discussed the above plan with the patient; follow up on 25th for labs/ possible chemo.

## 2015-12-04 NOTE — Progress Notes (Signed)
.[place] NOTE  Patient Care Team: Adin Hector, MD as PCP - General (Internal Medicine) Clent Jacks, RN as Registered Nurse  Oncology History   # OCT 2016- SQUAMOUS CELL CA of middle esophagus; Stage IV [EGD bx]; PET- Esopahgeal uptake & 2 CM Left Cervical LN-MET SQUAM; Carbo-taxol [Nov 9th]with RT [Nov 14th];  Cont Weekly Carbo- RT; JAN 3rd- 2017-PET stable esophageal uptake/slight increase neck adenopathy- s/p local radiation to the neck with carboplatin-   # FEB 20th- START FOLFIRI s/p 5 cycles-May 2017- PET- Stable-esophageal uptake mediastinal LN/ Improved Left neck LN;   # June 26 PET- PROGRESSION; START ABRAXANE  # TAXOL HELD sec to ACUTE INFUSION REACTIONS  # malnutrition s/p PEG tube  # CKD [stage III]; Hx of non-invasive bladder ca [s/p cystectomy with neo-bladder; [facility]]     Squamous cell esophageal cancer ([place])   05/14/2015 Initial Diagnosis    Squamous cell esophageal cancer (HCC)       Cancer of middle third of esophagus ([place])   10/19/2015 Initial Diagnosis    Cancer of middle third of esophagus (HCC)        HISTORY OF PRESENTING ILLNESS:  Jeffrey [name] 65 y.o. 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   In the interim patient was admitted to the hospital for increasing shortness of breath cough- imaging showed large cavitary right upper lobe mass-clinically suspicious for aspiration pneumonia. Treated with antibiotics IV; is currently on Augmentin. He was evaluated by pulmonary and ID in the hospital.  His breathing is better. Has better energy levels. He continues to have chest pain especially with coughing on the right side. He is taking oxycodone liquid. No hemoptysis.  He continues to use his PEG tube ; weight stable.  He has been using 4 cans of tube feeds every day. He has lost weight. He is able to  swallow water. Has not tried solids.   No significant diarrhea. No tingling or numbness.    ROS: A complete 10 point review of system is done which is negative except mentioned above in history of present illness.  MEDICAL HISTORY:  Past Medical History:  Diagnosis Date  . Bladder cancer [facility])    s/p surgery and reconstruction  . Chronic kidney disease    stage 3  . Emphysema of lung ([place])   . Esophageal cancer ([place])   . Hypertension     SURGICAL HISTORY: Past Surgical History:  Procedure Laterality Date  . BLADDER REMOVAL    . ESOPHAGOGASTRODUODENOSCOPY N/A 05/22/2015   Procedure: ESOPHAGOGASTRODUODENOSCOPY (EGD);  Surgeon: Manya Silvas, MD;  Location: [facility] ENDOSCOPY;  Service: Endoscopy;  Laterality: N/A;  . ESOPHAGOGASTRODUODENOSCOPY (EGD) WITH PROPOFOL N/A 01/21/2015   Procedure: ESOPHAGOGASTRODUODENOSCOPY (EGD) WITH PROPOFOL-looking in the esophagus stomach and upper small intestine to evaluate and treat;  Surgeon: [name], MD;  Location: [facility] ENDOSCOPY;  Service: Endoscopy;  Laterality: N/A;  . GASTROSTOMY W/ FEEDING TUBE Left    placed end of october 2016  . LYMPH NODE BIOPSY N/A 02/04/2015   Procedure: LYMPH NODE BIOPSY;  Surgeon: [name], MD;  Location: [facility] ORS;  Service: General;  Laterality: N/A;  . PORTACATH PLACEMENT Right 02/04/2015   Procedure: INSERTION PORT-A-CATH;  Surgeon: [name], MD;  Location: [facility] ORS;  Service: General;  Laterality: Right;    SOCIAL HISTORY: Social History   Social History  . Marital status: Married  Spouse name: N/A  . Number of children: N/A  . Years of education: N/A   Occupational History  . Not on file.   Social History Main Topics  . Smoking status: Current Every Day Smoker    Packs/day: 0.50    Years: 47.00    Types: Cigarettes  . Smokeless tobacco: Current User    Types: Chew  . Alcohol use No     Comment: quit drinking 3 months ago  . Drug use: No  . Sexual activity: Not on file   Other  Topics Concern  . Not on file   Social History Narrative   Lives at home with wife. Independent at baseline.    FAMILY HISTORY: Family History  Problem Relation Age of Onset  . CAD Father   . Breast cancer Mother     ALLERGIES:  is allergic to aspirin and taxol [paclitaxel].  MEDICATIONS:  Current Outpatient Prescriptions  Medication Sig Dispense Refill  . amoxicillin-clavulanate (AUGMENTIN) 400-57 MG/5ML suspension Place 10 mLs into feeding tube 2 (two) times daily. 100 mL 3  . diphenoxylate-atropine (LOMOTIL) 2.5-0.025 MG tablet Take 1 tablet by mouth 4 (four) times daily as needed for diarrhea or loose stools. Also take 1 pills 30-60 minutes prior to starting chemotherapy/week cycle. 40 tablet 3  . lidocaine-prilocaine (EMLA) cream Apply cream 1 hour before chemotherapy treatment, place a small amount of saran wrap over the cream to protect your clothing 30 g 1  . neomycin-bacitracin-polymyxin (NEOSPORIN) OINT Apply 1 application topically daily. 30 g 1  . Nutritional Supplements (FEEDING SUPPLEMENT, JEVITY 1.5 CAL/FIBER,) LIQD Place 237 mLs into feeding tube 6 (six) times daily. 1000 mL 5  . ondansetron (ZOFRAN) 8 MG tablet Take 1 tablet (8 mg total) by mouth every 8 (eight) hours as needed for nausea or vomiting (start 3 days; after chemo). 40 tablet 3  . oxyCODONE (ROXICODONE) 5 MG/5ML solution Place 5 mLs (5 mg total) into feeding tube every 6 (six) hours as needed for severe pain. 120 mL 0  . prochlorperazine (COMPAZINE) 10 MG tablet Take 1 tablet (10 mg total) by mouth every 6 (six) hours as needed for nausea or vomiting. 40 tablet 3   No current facility-administered medications for this visit.    Facility-Administered Medications Ordered in Other Visits  Medication Dose Route Frequency Provider Last Rate Last Dose  . sodium chloride 0.9 % injection 10 mL  10 mL Intracatheter PRN Cammie Sickle, MD   10 mL at 03/04/15 0933      .  PHYSICAL EXAMINATION: ECOG  PERFORMANCE STATUS: 1 - Symptomatic but completely ambulatory  Vitals:   12/04/15 0944  BP: 105/68  Pulse: (!) 119  Resp: 18  Temp: 97.3 F (36.3 C)   Filed Weights   12/04/15 0944  Weight: 112 lb 8 oz (51 kg)    GENERAL: Thin built; moderately nourished. Alert, no distress.he is alone. He is walking.  EYES: no pallor or icterus OROPHARYNX: no thrush or ulceration; good dentition  NECK: supple, no masses felt LYMPH:  2cm  CM palpable lymphadenopathy in the cervical on left side  LUNGS: Decreased breath sounds bilaterally No wheeze or crackles HEART/CVS: regular rate & rhythm and no murmurs; No lower extremity edema ABDOMEN: abdomen soft, non-tender and normal bowel sounds; PEG tube in place Musculoskeletal:no cyanosis of digits and no clubbing  PSYCH: alert & oriented x 3 with fluent speech NEURO: no focal motor/sensory deficits SKIN:  no rashes or significant lesions  LABORATORY DATA:  I have reviewed the data as listed Lab Results  Component Value Date   WBC 12.9 (H) 12/04/2015   HGB 9.4 (L) 12/04/2015   HCT 28.1 (L) 12/04/2015   MCV 89.4 12/04/2015   PLT 215 12/04/2015    Recent Labs  11/23/15 1044 11/23/15 1640  11/27/15 0400 11/28/15 0637 12/04/15 0931  NA 133* 137  < > 147* 144 134*  K 4.6 4.7  < > 4.1 4.1 4.5  CL 97* 102  < > 114* 110 100*  CO2 24 22  < > _0 GLUCOSE 135* 111*  < > 95 85 108*  BUN 96* 92*  < > 36* 35* 34*  CREATININE 2.17* 2.15*  < > 1.56* 1.56* 1.60*  CALCIUM 8.9 8.9  < > 8.5* 8.5* 9.0  GFRNONAA 31* 31*  < > 46* 46* 44*  GFRAA 35* 36*  < > 53* 53* 51*  PROT 8.2* 7.6  --   --   --  7.9  ALBUMIN 2.5* 2.4*  --   --   --  2.4*  AST 17 16  --   --   --  14*  ALT 10* 7*  --   --   --  10*  ALKPHOS 74 70  --   --   --  60  BILITOT 0.7 0.7  --   --   --  0.3  < > = values in this interval not displayed.   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 Status post cycle # 2. No significant clinical response is noted- left side neck lymph node stable.  HOLD chemo for now sec to recent pneumonia.  # RUL Pneumonia/aspiration- on augumentin BID; awaiting Dr.Fitzgerald follow up on 18th.   # pain - continue oxycodone liquid. Stable.   #  Discussed the above plan with the patient; follow up on 25th for labs/ possible chemo.     Cammie Sickle, MD 12/08/2015 8:02 AM

## 2015-12-04 NOTE — Telephone Encounter (Signed)
Called to check if we are aware that Jeffrey [name] just picked up 100 ml Oxycodone elixir on 8/28. Per Dr Rogue Bussing, he was not aware of this and to hold refill until Sunday 9/3. Donnie informed of this and repeated back to me

## 2015-12-22 NOTE — Progress Notes (Addendum)
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 -Appears to be doing much better. Continue tube feeding.  --the patient had a CXR at [facility] clinic, the results are not visible will request those results from [place] 01/06/16:  --Reviewed result of CXR from [facility] per report from film on 12/21/15;  "decrease in right upper lobe infiltrate with loss of the cavitary component. Some residual density is noted consistent with this scarring and the collapsing cavitary lesion"  Subcarinal and right hilar lymphadenopathy, likely secondary to esophageal cancer. -subcarinal lymphadenopathy appears to have progressed since the previous films, with possible extension into the right hilar area.  Acute respiratory failure. -doing better, advance activity.  Esophageal cancer. -Patient's aspiration pneumonia is likely secondary to esophageal dysmotility with esophageal obstruction. Continue management as per oncology.   Insomnia Notes difficulty falling asleep, has not responded to tylenol PM. Will start ambien.    Date: 12/22/2015  MRN# [id number] Jeffrey [name] 13-May-1950   Jeffrey [name] is a 65 y.o. old male seen in follow up for chief complaint of  Chief Complaint  Patient presents with  . Hospitalization Follow-up    Discharged 11-30-15. pt states breathing is baseline since being discharged, pt c/o occ prod cough with yellow to greenish mucus, sob with exertion, weakness & right side of back is sore.      HPI:   Patient is a 65 year old male with a history of esophageal cancer. His last seen by our service in the inpatient setting for pneumonia. At that time appeared that he had esophageal dysmotility likely aspiration pneumonia. He also had significant right hilar adenopathy as well as subcarinal adenopathy, and  large cavitary right upper lobe mass  which was either secondary to aspiration pneumonia.   Today he notes that he is feeling well, he has his baseline dyspnea on exertion. He denies cough or increased sputum production, he has some pain in the right side if he does cough.   Medication:   Outpatient Encounter Prescriptions as of 12/23/2015  Medication Sig  . diphenoxylate-atropine (LOMOTIL) 2.5-0.025 MG tablet Take 1 tablet by mouth 4 (four) times daily as needed for diarrhea or loose stools. Also take 1 pills 30-60 minutes prior to starting chemotherapy/week cycle.  . lidocaine-prilocaine (EMLA) cream Apply cream 1 hour before chemotherapy treatment, place a small amount of saran wrap over the cream to protect your clothing  . neomycin-bacitracin-polymyxin (NEOSPORIN) OINT Apply 1 application topically daily.  . Nutritional Supplements (FEEDING SUPPLEMENT, JEVITY 1.5 CAL/FIBER,) LIQD Place 237 mLs into feeding tube 6 (six) times daily.  . ondansetron (ZOFRAN) 8 MG tablet Take 1 tablet (8 mg total) by mouth every 8 (eight) hours as needed for nausea or vomiting (start 3 days; after chemo).  Marland Kitchen oxyCODONE (ROXICODONE) 5 MG/5ML solution Place 5 mLs (5 mg total) into feeding tube every 6 (six) hours as needed for severe pain.  Marland Kitchen prochlorperazine (COMPAZINE) 10 MG tablet Take 1 tablet (10 mg total) by mouth every 6 (six) hours as needed for nausea or vomiting.   Facility-Administered Encounter Medications as of 12/23/2015  Medication  . sodium chloride 0.9 % injection 10 mL     Allergies:  Aspirin and Taxol [paclitaxel]  Review of Systems: Gen:  Denies  fever, sweats. HEENT: Denies blurred vision. Cvc:  No dizziness, chest pain or heaviness Resp:   Denies cough or sputum porduction. Psych:  No depression, insomnia. Other:  All other systems were reviewed and found to be negative other than what is mentioned in the HPI.   Physical Examination:   VS: BP 128/62 (BP Location: Left Arm, Cuff Size: Normal)   Pulse (!) 110   Ht 5\' 7"   (1.702 m)   Wt 113 lb (51.3 kg)   SpO2 100%   BMI 17.70 kg/m   General Appearance: No distress  Neuro:without focal findings,  speech normal,  HEENT: PERRLA, EOM intact. Pulmonary: normal breath sounds, No wheezing.   CardiovascularNormal S1,S2.  No m/r/g.   Abdomen: Benign, Soft, non-tender. Renal:  No costovertebral tenderness  GU:  Not performed at this time. Endoc: No evident thyromegaly, no signs of acromegaly. Skin:   warm, no rash. Extremities: blackened nail bed, no cyanosis, clubbing.   LABORATORY PANEL:   CBC No results for input(s): WBC, HGB, HCT, PLT in the last 168 hours. ------------------------------------------------------------------------------------------------------------------  Chemistries  No results for input(s): NA, K, CL, CO2, GLUCOSE, BUN, CREATININE, CALCIUM, MG, AST, ALT, ALKPHOS, BILITOT in the last 168 hours.  Invalid input(s): GFRCGP ------------------------------------------------------------------------------------------------------------------  Cardiac Enzymes No results for input(s): TROPONINI in the last 168 hours. ------------------------------------------------------------  RADIOLOGY:   No results found for this or any previous visit. Results for orders placed during the hospital encounter of 11/23/15  DG Chest 2 View   Narrative CLINICAL DATA:  Follow up pneumonia. Persistent cough. History of bladder cancer and esophageal cancer.  EXAM: CHEST  2 VIEW  COMPARISON:  Radiographs 11/23/2015. Chest CT 11/24/2015. PET CT 09/25/2015.  FINDINGS: Right IJ Port-A-Cath tip is unchanged at the SVC right atrial junction. The heart size and mediastinal contours are stable. There is persistent cavitary airspace disease posteromedially in the right hemithorax, involving the upper and lower lobes on CT. The surrounding airspace disease in the right upper lobe may be slightly progressive. Patchy airspace opacities at both lung bases  are unchanged. There is no pleural effusion or pneumothorax. Percutaneous G-tube noted.  IMPRESSION: Little change in cavitary airspace disease in the right lung compared with prior studies from last week. Findings could reflect cavitary pneumonia. However, if the patient has received radiation therapy for esophageal cancer, radiation pneumonitis/ necrosis may be contributory.   Electronically Signed   By: Richardean Sale M.D.   On: 11/30/2015 09:42    ------------------------------------------------------------------------------------------------------------------  Thank  you for allowing [facility], Critical Care to assist in the care of your patient. Our recommendations are noted above.  Please contact us if we can be of further service.   Marda Stalker, MD.  [facility] Office Number: [phone]  Patricia Pesa, M.D.  Vilinda Boehringer, M.D.  Merton Border, M.D  12/22/2015

## 2015-12-23 ENCOUNTER — Encounter: Payer: Self-pay | Admitting: Internal Medicine

## 2015-12-23 ENCOUNTER — Ambulatory Visit (INDEPENDENT_AMBULATORY_CARE_PROVIDER_SITE_OTHER): Payer: Self-pay | Admitting: Internal Medicine

## 2015-12-23 VITALS — BP 128/62 | HR 110 | Ht 67.0 in | Wt 113.0 lb

## 2015-12-23 DIAGNOSIS — J690 Pneumonitis due to inhalation of food and vomit: Secondary | ICD-10-CM

## 2015-12-23 MED ORDER — ZOLPIDEM TARTRATE 5 MG PO TABS: 5.0000 mg | ORAL_TABLET | Freq: Every evening | ORAL | 5 refills | Status: AC | PRN

## 2015-12-23 NOTE — Patient Instructions (Addendum)
Ambien 5 mg take at bedtime, 1 month supply, 5 refills.

## 2015-12-28 ENCOUNTER — Inpatient Hospital Stay: Payer: Self-pay

## 2015-12-28 ENCOUNTER — Encounter: Payer: Self-pay | Admitting: Internal Medicine

## 2015-12-28 ENCOUNTER — Inpatient Hospital Stay (HOSPITAL_BASED_OUTPATIENT_CLINIC_OR_DEPARTMENT_OTHER): Payer: Self-pay | Admitting: Internal Medicine

## 2015-12-28 VITALS — BP 106/72 | HR 98 | Temp 97.9°F | Resp 18 | Ht 67.0 in | Wt 113.8 lb

## 2015-12-28 DIAGNOSIS — G893 Neoplasm related pain (acute) (chronic): Secondary | ICD-10-CM

## 2015-12-28 DIAGNOSIS — Z803 Family history of malignant neoplasm of breast: Secondary | ICD-10-CM

## 2015-12-28 DIAGNOSIS — Z79899 Other long term (current) drug therapy: Secondary | ICD-10-CM

## 2015-12-28 DIAGNOSIS — C159 Malignant neoplasm of esophagus, unspecified: Secondary | ICD-10-CM

## 2015-12-28 DIAGNOSIS — J439 Emphysema, unspecified: Secondary | ICD-10-CM

## 2015-12-28 DIAGNOSIS — C154 Malignant neoplasm of middle third of esophagus: Secondary | ICD-10-CM

## 2015-12-28 DIAGNOSIS — E46 Unspecified protein-calorie malnutrition: Secondary | ICD-10-CM

## 2015-12-28 DIAGNOSIS — Z8551 Personal history of malignant neoplasm of bladder: Secondary | ICD-10-CM

## 2015-12-28 DIAGNOSIS — Z931 Gastrostomy status: Secondary | ICD-10-CM

## 2015-12-28 DIAGNOSIS — I129 Hypertensive chronic kidney disease with stage 1 through stage 4 chronic kidney disease, or unspecified chronic kidney disease: Secondary | ICD-10-CM

## 2015-12-28 DIAGNOSIS — Z923 Personal history of irradiation: Secondary | ICD-10-CM

## 2015-12-28 DIAGNOSIS — J690 Pneumonitis due to inhalation of food and vomit: Secondary | ICD-10-CM

## 2015-12-28 DIAGNOSIS — N183 Chronic kidney disease, stage 3 (moderate): Secondary | ICD-10-CM

## 2015-12-28 DIAGNOSIS — Z9221 Personal history of antineoplastic chemotherapy: Secondary | ICD-10-CM

## 2015-12-28 DIAGNOSIS — R634 Abnormal weight loss: Secondary | ICD-10-CM

## 2015-12-28 DIAGNOSIS — F17210 Nicotine dependence, cigarettes, uncomplicated: Secondary | ICD-10-CM

## 2015-12-28 LAB — COMPREHENSIVE METABOLIC PANEL
ALBUMIN: 3 g/dL — AB (ref 3.5–5.0)
ALK PHOS: 52 U/L (ref 38–126)
ALT: 8 U/L — AB (ref 17–63)
AST: 16 U/L (ref 15–41)
Anion gap: 7 (ref 5–15)
BILIRUBIN TOTAL: 0.4 mg/dL (ref 0.3–1.2)
BUN: 43 mg/dL — AB (ref 6–20)
CALCIUM: 8.7 mg/dL — AB (ref 8.9–10.3)
CO2: 29 mmol/L (ref 22–32)
CREATININE: 1.37 mg/dL — AB (ref 0.61–1.24)
Chloride: 97 mmol/L — ABNORMAL LOW (ref 101–111)
GFR calc Af Amer: >60 mL/min (ref >60)
GFR, EST NON AFRICAN AMERICAN: 53 mL/min — AB (ref >60)
GLUCOSE: 90 mg/dL (ref 65–99)
Potassium: 4.1 mmol/L (ref 3.5–5.1)
Sodium: 133 mmol/L — ABNORMAL LOW (ref 135–145)
TOTAL PROTEIN: 8 g/dL (ref 6.5–8.1)

## 2015-12-28 LAB — CBC WITH DIFFERENTIAL/PLATELET
BASOS ABS: 0 10*3/uL (ref 0–0.1)
BASOS PCT: 0 %
EOS ABS: 0 10*3/uL (ref 0–0.7)
EOS PCT: 0 %
HCT: 26.4 % — ABNORMAL LOW (ref 40.0–52.0)
Hemoglobin: 9 g/dL — ABNORMAL LOW (ref 13.0–18.0)
LYMPHS PCT: 23 %
Lymphs Abs: 2 10*3/uL (ref 1.0–3.6)
MCH: 29.8 pg (ref 26.0–34.0)
MCHC: 33.9 g/dL (ref 32.0–36.0)
MCV: 87.9 fL (ref 80.0–100.0)
MONO ABS: 1.2 10*3/uL — AB (ref 0.2–1.0)
Monocytes Relative: 14 %
Neutro Abs: 5.3 10*3/uL (ref 1.4–6.5)
Neutrophils Relative %: 63 %
PLATELETS: 273 10*3/uL (ref 150–440)
RBC: 3.01 MIL/uL — ABNORMAL LOW (ref 4.40–5.90)
RDW: 19.1 % — AB (ref 11.5–14.5)
WBC: 8.5 10*3/uL (ref 3.8–10.6)

## 2015-12-28 MED ORDER — HEPARIN SOD (PORK) LOCK FLUSH 100 UNIT/ML IV SOLN
500.0000 [IU] | Freq: Once | INTRAVENOUS | Status: AC
  Filled 2015-12-28: qty 5

## 2015-12-28 MED ORDER — HEPARIN SOD (PORK) LOCK FLUSH 100 UNIT/ML IV SOLN
500.0000 [IU] | Freq: Once | INTRAVENOUS | Status: AC | PRN
  Administered 2015-12-28: 500 [IU]

## 2015-12-28 MED ORDER — PACLITAXEL PROTEIN-BOUND CHEMO INJECTION 100 MG
100.0000 mg/m2 | Freq: Once | INTRAVENOUS | Status: AC
  Administered 2015-12-28: 150 mg via INTRAVENOUS
  Filled 2015-12-28: qty 30

## 2015-12-28 MED ORDER — SODIUM CHLORIDE 0.9% FLUSH
10.0000 mL | Freq: Once | INTRAVENOUS | Status: AC
  Filled 2015-12-28: qty 10

## 2015-12-28 MED ORDER — SODIUM CHLORIDE 0.9 % IV SOLN
Freq: Once | INTRAVENOUS | Status: AC
Start: 2015-12-28 — End: 2015-12-28
  Administered 2015-12-28: 15:00:00 via INTRAVENOUS
  Filled 2015-12-28: qty 1000

## 2015-12-28 MED ORDER — OXYCODONE HCL 5 MG/5ML PO SOLN: 5.0000 mg | Freq: Four times a day (QID) | ORAL | 0 refills | Status: DC | PRN

## 2015-12-28 MED ORDER — PROCHLORPERAZINE EDISYLATE 5 MG/ML IJ SOLN
10.0000 mg | Freq: Once | INTRAMUSCULAR | Status: AC
  Administered 2015-12-28: 10 mg via INTRAVENOUS
  Filled 2015-12-28: qty 2

## 2015-12-28 NOTE — Progress Notes (Signed)
Pt complains of chest pain related to recent pneumonia and no sleep with ambien medication

## 2015-12-28 NOTE — Assessment & Plan Note (Addendum)
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 Status post cycle # 2. Chemotherapy was interrupted because of right upper lobe pneumonia/C discussion below  # Progressive disease noted in the left neck ; Restart chemotherapy cycle #3 today; day #1  # RUL Pneumonia/aspiration- status post antibiotics. No fevers no white count. Discussed with Dr. Ola Spurr; chest x-ray improved.  # pain - continue oxycodone liquid. Stable.   # CBC BMP chemotherapy next week; follow with me in 3 weeks CBC CMP chemotherapy.

## 2015-12-28 NOTE — Progress Notes (Signed)
.[name] NOTE  Patient Care Team: Adin Hector, MD as PCP - General (Internal Medicine) Clent Jacks, RN as Registered Nurse  Oncology History   # OCT 2016- SQUAMOUS CELL CA of middle esophagus; Stage IV [EGD bx]; PET- Esopahgeal uptake & 2 CM Left Cervical LN-MET SQUAM; Carbo-taxol [Nov 9th]with RT [Nov 14th];  Cont Weekly Carbo- RT; JAN 3rd- 2017-PET stable esophageal uptake/slight increase neck adenopathy- s/p local radiation to the neck with carboplatin-   # FEB 20th- START FOLFIRI s/p 5 cycles-May 2017- PET- Stable-esophageal uptake mediastinal LN/ Improved Left neck LN;   # June 26 PET- PROGRESSION; START ABRAXANE  # TAXOL HELD sec to ACUTE INFUSION REACTIONS  # malnutrition s/p PEG tube  # CKD [stage III]; Hx of non-invasive bladder ca [s/p cystectomy with neo-bladder; [facility]]     Squamous cell esophageal cancer ([place])   05/14/2015 Initial Diagnosis    Squamous cell esophageal cancer (HCC)       Cancer of middle third of esophagus ([place])   10/19/2015 Initial Diagnosis    Cancer of middle third of esophagus (HCC)        HISTORY OF PRESENTING ILLNESS:  Jeffrey [name] 65 y.o. 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  Patient's chemotherapy is on hold because of right upper lobe aspiration pneumonia; status post antibiotics.  His breathing is improved. Right-sided chest pain has improved.Marland Kitchen He is taking oxycodone liquid. No hemoptysis.  He continues to use his PEG tube ; weight stable.  He has been using 4 cans of tube feeds every day. He has lost weight. He is able to swallow water. Has not tried solids.   No significant diarrhea. No tingling or numbness.    ROS: A complete 10 point review of system is done which is negative except mentioned above in history of present illness.  MEDICAL HISTORY:  Past Medical History:   Diagnosis Date  . Bladder cancer [facility])    s/p surgery and reconstruction  . Chronic kidney disease    stage 3  . Emphysema of lung ([place])   . Esophageal cancer ([place])   . Hypertension     SURGICAL HISTORY: Past Surgical History:  Procedure Laterality Date  . BLADDER REMOVAL    . ESOPHAGOGASTRODUODENOSCOPY N/A 05/22/2015   Procedure: ESOPHAGOGASTRODUODENOSCOPY (EGD);  Surgeon: Manya Silvas, MD;  Location: [facility] ENDOSCOPY;  Service: Endoscopy;  Laterality: N/A;  . ESOPHAGOGASTRODUODENOSCOPY (EGD) WITH PROPOFOL N/A 01/21/2015   Procedure: ESOPHAGOGASTRODUODENOSCOPY (EGD) WITH PROPOFOL-looking in the esophagus stomach and upper small intestine to evaluate and treat;  Surgeon: [name], MD;  Location: [facility] ENDOSCOPY;  Service: Endoscopy;  Laterality: N/A;  . GASTROSTOMY W/ FEEDING TUBE Left    placed end of october 2016  . LYMPH NODE BIOPSY N/A 02/04/2015   Procedure: LYMPH NODE BIOPSY;  Surgeon: [name], MD;  Location: [facility] ORS;  Service: General;  Laterality: N/A;  . PORTACATH PLACEMENT Right 02/04/2015   Procedure: INSERTION PORT-A-CATH;  Surgeon: [name], MD;  Location: [facility] ORS;  Service: General;  Laterality: Right;    SOCIAL HISTORY: Social History   Social History  . Marital status: Married    Spouse name: N/A  . Number of children: N/A  . Years of education: N/A   Occupational History  . Not on file.   Social History Main Topics  . Smoking status: Current Every Day Smoker  Packs/day: 0.50    Years: 47.00    Types: Cigarettes  . Smokeless tobacco: Former Systems developer    Types: Chew  . Alcohol use No     Comment: quit drinking 3 months ago  . Drug use: No  . Sexual activity: Not on file   Other Topics Concern  . Not on file   Social History Narrative   Lives at home with wife. Independent at baseline.    FAMILY HISTORY: Family History  Problem Relation Age of Onset  . CAD Father   . Breast cancer Mother     ALLERGIES:  is allergic to  aspirin and taxol [paclitaxel].  MEDICATIONS:  Current Outpatient Prescriptions  Medication Sig Dispense Refill  . diphenoxylate-atropine (LOMOTIL) 2.5-0.025 MG tablet Take 1 tablet by mouth 4 (four) times daily as needed for diarrhea or loose stools. Also take 1 pills 30-60 minutes prior to starting chemotherapy/week cycle. 40 tablet 3  . lidocaine-prilocaine (EMLA) cream Apply cream 1 hour before chemotherapy treatment, place a small amount of saran wrap over the cream to protect your clothing 30 g 1  . neomycin-bacitracin-polymyxin (NEOSPORIN) OINT Apply 1 application topically daily. 30 g 1  . Nutritional Supplements (FEEDING SUPPLEMENT, JEVITY 1.5 CAL/FIBER,) LIQD Place 237 mLs into feeding tube 6 (six) times daily. 1000 mL 5  . ondansetron (ZOFRAN) 8 MG tablet Take 1 tablet (8 mg total) by mouth every 8 (eight) hours as needed for nausea or vomiting (start 3 days; after chemo). 40 tablet 3  . oxyCODONE (ROXICODONE) 5 MG/5ML solution Place 5 mLs (5 mg total) into feeding tube every 6 (six) hours as needed for severe pain. 120 mL 0  . prochlorperazine (COMPAZINE) 10 MG tablet Take 1 tablet (10 mg total) by mouth every 6 (six) hours as needed for nausea or vomiting. 40 tablet 3  . zolpidem (AMBIEN) 5 MG tablet Take 1 tablet (5 mg total) by mouth at bedtime as needed for sleep. 30 tablet 5   No current facility-administered medications for this visit.    Facility-Administered Medications Ordered in Other Visits  Medication Dose Route Frequency Provider Last Rate Last Dose  . sodium chloride 0.9 % injection 10 mL  10 mL Intracatheter PRN Cammie Sickle, MD   10 mL at 03/04/15 0933      .  PHYSICAL EXAMINATION: ECOG PERFORMANCE STATUS: 1 - Symptomatic but completely ambulatory  Vitals:   12/28/15 1423  BP: 106/72  Pulse: 98  Resp: 18  Temp: 97.9 F (36.6 C)   Filed Weights   12/28/15 1423  Weight: 113 lb 12.8 oz (51.6 kg)    GENERAL: Thin built; moderately nourished.  Alert, no distress.he is alone. He is walking.  EYES: no pallor or icterus OROPHARYNX: no thrush or ulceration; good dentition  NECK: supple, no masses felt LYMPH:  4cm  CM palpable lymphadenopathy in the cervical on left side  LUNGS: Decreased breath sounds bilaterally No wheeze or crackles HEART/CVS: regular rate & rhythm and no murmurs; No lower extremity edema ABDOMEN: abdomen soft, non-tender and normal bowel sounds; PEG tube in place Musculoskeletal:no cyanosis of digits and no clubbing  PSYCH: alert & oriented x 3 with fluent speech NEURO: no focal motor/sensory deficits SKIN:  no rashes or significant lesions  LABORATORY DATA:  I have reviewed the data as listed Lab Results  Component Value Date   WBC 8.5 12/28/2015   HGB 9.0 (L) 12/28/2015   HCT 26.4 (L) 12/28/2015   MCV 87.9 12/28/2015   PLT  273 12/28/2015    Recent Labs  11/23/15 1640  11/28/15 0637 12/04/15 0931 12/28/15 1415  NA 137  < > 144 134* 133*  K 4.7  < > 4.1 4.5 4.1  CL 102  < > 110 100* 97*  CO2 22  < > _0 GLUCOSE 111*  < > 85 108* 90  BUN 92*  < > 35* 34* 43*  CREATININE 2.15*  < > 1.56* 1.60* 1.37*  CALCIUM 8.9  < > 8.5* 9.0 8.7*  GFRNONAA 31*  < > 46* 44* 53*  GFRAA 36*  < > 53* 51* >60  PROT 7.6  --   --  7.9 8.0  ALBUMIN 2.4*  --   --  2.4* 3.0*  AST 16  --   --  14* 16  ALT 7*  --   --  10* 8*  ALKPHOS 70  --   --  60 52  BILITOT 0.7  --   --  0.3 0.4  < > = values in this interval not displayed.   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 Status post cycle # 2. Chemotherapy was interrupted because of right upper lobe pneumonia/C discussion below  # Progressive disease noted in the left neck ; Restart chemotherapy cycle #3 today; day #1  # RUL Pneumonia/aspiration- status post antibiotics. No fevers no white count. Discussed with Dr. Ola Spurr; chest x-ray  improved.  # pain - continue oxycodone liquid. Stable.   # CBC BMP chemotherapy next week; follow with me in 3 weeks CBC CMP chemotherapy.    Cammie Sickle, MD 12/29/2015 5:36 PM

## 2016-01-02 ENCOUNTER — Other Ambulatory Visit: Payer: Self-pay | Admitting: Oncology

## 2016-01-02 DIAGNOSIS — C154 Malignant neoplasm of middle third of esophagus: Secondary | ICD-10-CM

## 2016-01-02 DIAGNOSIS — R05 Cough: Secondary | ICD-10-CM

## 2016-01-02 DIAGNOSIS — E860 Dehydration: Secondary | ICD-10-CM

## 2016-01-02 DIAGNOSIS — R059 Cough, unspecified: Secondary | ICD-10-CM

## 2016-01-02 MED ORDER — AMOXICILLIN-POT CLAVULANATE 875-125 MG PO TABS: ORAL_TABLET | ORAL | 0 refills | Status: DC

## 2016-01-04 ENCOUNTER — Inpatient Hospital Stay: Payer: Self-pay

## 2016-01-04 ENCOUNTER — Inpatient Hospital Stay: Payer: Self-pay | Attending: Internal Medicine

## 2016-01-04 VITALS — BP 95/55 | HR 109

## 2016-01-04 DIAGNOSIS — J690 Pneumonitis due to inhalation of food and vomit: Secondary | ICD-10-CM | POA: Insufficient documentation

## 2016-01-04 DIAGNOSIS — R634 Abnormal weight loss: Secondary | ICD-10-CM | POA: Insufficient documentation

## 2016-01-04 DIAGNOSIS — C159 Malignant neoplasm of esophagus, unspecified: Secondary | ICD-10-CM

## 2016-01-04 DIAGNOSIS — Z803 Family history of malignant neoplasm of breast: Secondary | ICD-10-CM | POA: Insufficient documentation

## 2016-01-04 DIAGNOSIS — F17210 Nicotine dependence, cigarettes, uncomplicated: Secondary | ICD-10-CM | POA: Insufficient documentation

## 2016-01-04 DIAGNOSIS — J439 Emphysema, unspecified: Secondary | ICD-10-CM | POA: Insufficient documentation

## 2016-01-04 DIAGNOSIS — E46 Unspecified protein-calorie malnutrition: Secondary | ICD-10-CM | POA: Insufficient documentation

## 2016-01-04 DIAGNOSIS — C154 Malignant neoplasm of middle third of esophagus: Secondary | ICD-10-CM | POA: Insufficient documentation

## 2016-01-04 DIAGNOSIS — Z5111 Encounter for antineoplastic chemotherapy: Secondary | ICD-10-CM | POA: Insufficient documentation

## 2016-01-04 DIAGNOSIS — N183 Chronic kidney disease, stage 3 (moderate): Secondary | ICD-10-CM | POA: Insufficient documentation

## 2016-01-04 DIAGNOSIS — C770 Secondary and unspecified malignant neoplasm of lymph nodes of head, face and neck: Secondary | ICD-10-CM | POA: Insufficient documentation

## 2016-01-04 DIAGNOSIS — Z79899 Other long term (current) drug therapy: Secondary | ICD-10-CM | POA: Insufficient documentation

## 2016-01-04 DIAGNOSIS — D696 Thrombocytopenia, unspecified: Secondary | ICD-10-CM | POA: Insufficient documentation

## 2016-01-04 DIAGNOSIS — R0789 Other chest pain: Secondary | ICD-10-CM | POA: Insufficient documentation

## 2016-01-04 DIAGNOSIS — Z923 Personal history of irradiation: Secondary | ICD-10-CM | POA: Insufficient documentation

## 2016-01-04 DIAGNOSIS — I129 Hypertensive chronic kidney disease with stage 1 through stage 4 chronic kidney disease, or unspecified chronic kidney disease: Secondary | ICD-10-CM | POA: Insufficient documentation

## 2016-01-04 DIAGNOSIS — Z931 Gastrostomy status: Secondary | ICD-10-CM | POA: Insufficient documentation

## 2016-01-04 LAB — CBC WITH DIFFERENTIAL/PLATELET
Basophils Absolute: 0 10*3/uL (ref 0–0.1)
Basophils Relative: 0 %
Eosinophils Absolute: 0.1 10*3/uL (ref 0–0.7)
Eosinophils Relative: 1 %
HEMATOCRIT: 26.9 % — AB (ref 40.0–52.0)
HEMOGLOBIN: 9.2 g/dL — AB (ref 13.0–18.0)
LYMPHS ABS: 3 10*3/uL (ref 1.0–3.6)
LYMPHS PCT: 28 %
MCH: 29.9 pg (ref 26.0–34.0)
MCHC: 34.2 g/dL (ref 32.0–36.0)
MCV: 87.3 fL (ref 80.0–100.0)
Monocytes Absolute: 1.4 10*3/uL — ABNORMAL HIGH (ref 0.2–1.0)
Monocytes Relative: 13 %
NEUTROS ABS: 6.3 10*3/uL (ref 1.4–6.5)
NEUTROS PCT: 58 %
Platelets: 267 10*3/uL (ref 150–440)
RBC: 3.08 MIL/uL — AB (ref 4.40–5.90)
RDW: 19.4 % — ABNORMAL HIGH (ref 11.5–14.5)
WBC: 10.8 10*3/uL — AB (ref 3.8–10.6)

## 2016-01-04 LAB — BASIC METABOLIC PANEL
Anion gap: 8 (ref 5–15)
BUN: 42 mg/dL — AB (ref 6–20)
CHLORIDE: 95 mmol/L — AB (ref 101–111)
CO2: 28 mmol/L (ref 22–32)
Calcium: 8.7 mg/dL — ABNORMAL LOW (ref 8.9–10.3)
Creatinine, Ser: 1.46 mg/dL — ABNORMAL HIGH (ref 0.61–1.24)
GFR calc Af Amer: 57 mL/min — ABNORMAL LOW (ref >60)
GFR calc non Af Amer: 49 mL/min — ABNORMAL LOW (ref >60)
GLUCOSE: 113 mg/dL — AB (ref 65–99)
POTASSIUM: 4.1 mmol/L (ref 3.5–5.1)
SODIUM: 131 mmol/L — AB (ref 135–145)

## 2016-01-04 MED ORDER — SODIUM CHLORIDE 0.9% FLUSH
10.0000 mL | INTRAVENOUS | Status: DC | PRN
  Filled 2016-01-04: qty 10

## 2016-01-04 MED ORDER — SODIUM CHLORIDE 0.9 % IV SOLN
Freq: Once | INTRAVENOUS | Status: AC
  Administered 2016-01-04: 15:00:00 via INTRAVENOUS
  Filled 2016-01-04: qty 1000

## 2016-01-04 MED ORDER — PROCHLORPERAZINE EDISYLATE 5 MG/ML IJ SOLN
10.0000 mg | Freq: Once | INTRAMUSCULAR | Status: AC
  Administered 2016-01-04: 10 mg via INTRAVENOUS
  Filled 2016-01-04: qty 2

## 2016-01-04 MED ORDER — HEPARIN SOD (PORK) LOCK FLUSH 100 UNIT/ML IV SOLN
500.0000 [IU] | Freq: Once | INTRAVENOUS | Status: AC | PRN
  Administered 2016-01-04: 500 [IU]
  Filled 2016-01-04: qty 5

## 2016-01-04 MED ORDER — PACLITAXEL PROTEIN-BOUND CHEMO INJECTION 100 MG
100.0000 mg/m2 | Freq: Once | INTRAVENOUS | Status: AC
  Administered 2016-01-04: 150 mg via INTRAVENOUS
  Filled 2016-01-04: qty 30

## 2016-01-07 ENCOUNTER — Telehealth: Payer: Self-pay | Admitting: *Deleted

## 2016-01-07 ENCOUNTER — Ambulatory Visit
Admission: RE | Admit: 2016-01-07 | Discharge: 2016-01-07 | Disposition: A | Discharge status: Home / Self Care | Payer: Self-pay | Source: Ambulatory Visit | Attending: Internal Medicine | Admitting: Internal Medicine

## 2016-01-07 ENCOUNTER — Encounter: Payer: Self-pay | Admitting: *Deleted

## 2016-01-07 ENCOUNTER — Other Ambulatory Visit: Payer: Self-pay | Admitting: *Deleted

## 2016-01-07 ENCOUNTER — Other Ambulatory Visit: Payer: Self-pay | Admitting: Internal Medicine

## 2016-01-07 ENCOUNTER — Inpatient Hospital Stay: Payer: Self-pay

## 2016-01-07 VITALS — BP 91/68 | HR 102 | Temp 97.4°F | Resp 20

## 2016-01-07 DIAGNOSIS — C154 Malignant neoplasm of middle third of esophagus: Secondary | ICD-10-CM

## 2016-01-07 DIAGNOSIS — R05 Cough: Secondary | ICD-10-CM

## 2016-01-07 DIAGNOSIS — R918 Other nonspecific abnormal finding of lung field: Secondary | ICD-10-CM | POA: Insufficient documentation

## 2016-01-07 DIAGNOSIS — D508 Other iron deficiency anemias: Secondary | ICD-10-CM

## 2016-01-07 DIAGNOSIS — Z5189 Encounter for other specified aftercare: Secondary | ICD-10-CM

## 2016-01-07 DIAGNOSIS — R059 Cough, unspecified: Secondary | ICD-10-CM

## 2016-01-07 DIAGNOSIS — C159 Malignant neoplasm of esophagus, unspecified: Secondary | ICD-10-CM

## 2016-01-07 MED ORDER — MORPHINE SULFATE (PF) 2 MG/ML IV SOLN
2.0000 mg | Freq: Once | INTRAVENOUS | Status: AC
  Administered 2016-01-07: 2 mg via INTRAVENOUS
  Filled 2016-01-07: qty 1

## 2016-01-07 MED ORDER — HEPARIN SOD (PORK) LOCK FLUSH 100 UNIT/ML IV SOLN
500.0000 [IU] | Freq: Once | INTRAVENOUS | Status: AC
  Administered 2016-01-07: 500 [IU] via INTRAVENOUS

## 2016-01-07 MED ORDER — MORPHINE SULFATE 2 MG/ML IJ SOLN: 2.0000 mg | Freq: Once | INTRAMUSCULAR | Status: DC

## 2016-01-07 MED ORDER — OXYCODONE HCL 5 MG/5ML PO SOLN: 5.0000 mg | Freq: Four times a day (QID) | ORAL | 0 refills | Status: DC | PRN

## 2016-01-07 MED ORDER — SODIUM CHLORIDE 0.9 % IV SOLN
INTRAVENOUS | Status: DC | PRN
  Administered 2016-01-07: 14:00:00 via INTRAVENOUS
  Filled 2016-01-07: qty 1000

## 2016-01-07 MED ORDER — HEPARIN SOD (PORK) LOCK FLUSH 100 UNIT/ML IV SOLN
INTRAVENOUS | Status: AC
  Filled 2016-01-07: qty 5

## 2016-01-07 NOTE — Telephone Encounter (Signed)
Thermometer provided to patient.

## 2016-01-07 NOTE — Telephone Encounter (Signed)
Per VO Dr Rogue Bussing, patient to come in STAT fdor CXR with a called report. Patient states he will have to call his wife home form work to bring him and should be here within an hour, he will come to [facility] after CXR to get thermometer

## 2016-01-07 NOTE — Progress Notes (Signed)
Patient came back to cancer ctr s/p cxr. RN spoke with patient.  cxr Results demonstrate improvement of pleural fields compared to last cxr.  Pt c/o extreme amt of fatigue and weakness. Skin turgor very poor.  Decision made by provider to add pt to schedule for IV fluids today.

## 2016-01-07 NOTE — Telephone Encounter (Signed)
Called to report that he is worse, coughing all night can feel it rattling in chest and can't get all of it up. Sputum white to yellow. Chest is hurting from all the coughing. He awoke last night soaking wet, he does have a thermometer, so he does not know if he has a fever. I told him to ask for a thermometer next time he is in  Office and that I will discuss with Dr B and call him back

## 2016-01-14 ENCOUNTER — Other Ambulatory Visit: Payer: Self-pay | Admitting: *Deleted

## 2016-01-14 MED ORDER — BENZONATATE 100 MG PO CAPS: 100.0000 mg | ORAL_CAPSULE | Freq: Three times a day (TID) | ORAL | 0 refills | Status: DC | PRN

## 2016-01-14 NOTE — Telephone Encounter (Signed)
Still coughing up yellow sputum, using cough syrup, denies fever. Feels he needs to continue abx. Please advise

## 2016-01-18 ENCOUNTER — Inpatient Hospital Stay: Payer: Self-pay

## 2016-01-18 ENCOUNTER — Inpatient Hospital Stay (HOSPITAL_BASED_OUTPATIENT_CLINIC_OR_DEPARTMENT_OTHER): Payer: Self-pay | Admitting: Internal Medicine

## 2016-01-18 ENCOUNTER — Encounter: Payer: Self-pay | Admitting: Internal Medicine

## 2016-01-18 VITALS — BP 90/57 | HR 102 | Temp 98.4°F | Resp 18

## 2016-01-18 DIAGNOSIS — C154 Malignant neoplasm of middle third of esophagus: Secondary | ICD-10-CM

## 2016-01-18 DIAGNOSIS — Z79899 Other long term (current) drug therapy: Secondary | ICD-10-CM

## 2016-01-18 DIAGNOSIS — E46 Unspecified protein-calorie malnutrition: Secondary | ICD-10-CM

## 2016-01-18 DIAGNOSIS — Z95828 Presence of other vascular implants and grafts: Secondary | ICD-10-CM

## 2016-01-18 DIAGNOSIS — D696 Thrombocytopenia, unspecified: Secondary | ICD-10-CM

## 2016-01-18 DIAGNOSIS — N183 Chronic kidney disease, stage 3 (moderate): Secondary | ICD-10-CM

## 2016-01-18 DIAGNOSIS — J690 Pneumonitis due to inhalation of food and vomit: Secondary | ICD-10-CM

## 2016-01-18 DIAGNOSIS — Z923 Personal history of irradiation: Secondary | ICD-10-CM

## 2016-01-18 DIAGNOSIS — Z803 Family history of malignant neoplasm of breast: Secondary | ICD-10-CM

## 2016-01-18 DIAGNOSIS — C159 Malignant neoplasm of esophagus, unspecified: Secondary | ICD-10-CM

## 2016-01-18 DIAGNOSIS — F17210 Nicotine dependence, cigarettes, uncomplicated: Secondary | ICD-10-CM

## 2016-01-18 DIAGNOSIS — J439 Emphysema, unspecified: Secondary | ICD-10-CM

## 2016-01-18 DIAGNOSIS — Z931 Gastrostomy status: Secondary | ICD-10-CM

## 2016-01-18 DIAGNOSIS — C770 Secondary and unspecified malignant neoplasm of lymph nodes of head, face and neck: Secondary | ICD-10-CM

## 2016-01-18 DIAGNOSIS — I129 Hypertensive chronic kidney disease with stage 1 through stage 4 chronic kidney disease, or unspecified chronic kidney disease: Secondary | ICD-10-CM

## 2016-01-18 LAB — CBC WITH DIFFERENTIAL/PLATELET
BASOS PCT: 0 %
Basophils Absolute: 0 10*3/uL (ref 0–0.1)
Eosinophils Absolute: 0 10*3/uL (ref 0–0.7)
Eosinophils Relative: 0 %
HEMATOCRIT: 26.7 % — AB (ref 40.0–52.0)
HEMOGLOBIN: 9.3 g/dL — AB (ref 13.0–18.0)
LYMPHS ABS: 1.7 10*3/uL (ref 1.0–3.6)
LYMPHS PCT: 18 %
MCH: 30.3 pg (ref 26.0–34.0)
MCHC: 34.9 g/dL (ref 32.0–36.0)
MCV: 86.9 fL (ref 80.0–100.0)
MONOS PCT: 13 %
Monocytes Absolute: 1.2 10*3/uL — ABNORMAL HIGH (ref 0.2–1.0)
NEUTROS ABS: 6.8 10*3/uL — AB (ref 1.4–6.5)
NEUTROS PCT: 69 %
Platelets: 260 10*3/uL (ref 150–440)
RBC: 3.07 MIL/uL — ABNORMAL LOW (ref 4.40–5.90)
RDW: 19.1 % — ABNORMAL HIGH (ref 11.5–14.5)
WBC: 9.8 10*3/uL (ref 3.8–10.6)

## 2016-01-18 LAB — COMPREHENSIVE METABOLIC PANEL
ALBUMIN: 3 g/dL — AB (ref 3.5–5.0)
ALK PHOS: 49 U/L (ref 38–126)
ALT: 9 U/L — ABNORMAL LOW (ref 17–63)
ANION GAP: 10 (ref 5–15)
AST: 17 U/L (ref 15–41)
BUN: 49 mg/dL — ABNORMAL HIGH (ref 6–20)
CALCIUM: 8.8 mg/dL — AB (ref 8.9–10.3)
CO2: 32 mmol/L (ref 22–32)
Chloride: 88 mmol/L — ABNORMAL LOW (ref 101–111)
Creatinine, Ser: 1.61 mg/dL — ABNORMAL HIGH (ref 0.61–1.24)
GFR calc Af Amer: 51 mL/min — ABNORMAL LOW (ref >60)
GFR, EST NON AFRICAN AMERICAN: 44 mL/min — AB (ref >60)
GLUCOSE: 96 mg/dL (ref 65–99)
Potassium: 4 mmol/L (ref 3.5–5.1)
Sodium: 130 mmol/L — ABNORMAL LOW (ref 135–145)
TOTAL PROTEIN: 7.8 g/dL (ref 6.5–8.1)
Total Bilirubin: 0.6 mg/dL (ref 0.3–1.2)

## 2016-01-18 MED ORDER — SODIUM CHLORIDE 0.9 % IJ SOLN
10.0000 mL | Freq: Once | INTRAMUSCULAR | Status: AC
  Administered 2016-01-18: 10 mL via INTRAVENOUS
  Filled 2016-01-18: qty 10

## 2016-01-18 MED ORDER — LIDOCAINE-PRILOCAINE 2.5-2.5 % EX CREA: TOPICAL_CREAM | CUTANEOUS | 1 refills | Status: DC

## 2016-01-18 MED ORDER — OXYCODONE HCL 5 MG/5ML PO SOLN: 5.0000 mg | Freq: Four times a day (QID) | ORAL | 0 refills | Status: DC | PRN

## 2016-01-18 MED ORDER — HEPARIN SOD (PORK) LOCK FLUSH 100 UNIT/ML IV SOLN
500.0000 [IU] | Freq: Once | INTRAVENOUS | Status: AC
  Administered 2016-01-18: 500 [IU] via INTRAVENOUS

## 2016-01-18 NOTE — Progress Notes (Signed)
Pt only complains of pain when coughing.  Pt coughing up yellow moderate amount of mucous

## 2016-01-18 NOTE — Progress Notes (Signed)
.[place] NOTE  Patient Care Team: Adin Hector, MD as PCP - General (Internal Medicine) Clent Jacks, RN as Registered Nurse  Oncology History   # OCT 2016- SQUAMOUS CELL CA of middle esophagus; Stage IV [EGD bx]; PET- Esopahgeal uptake & 2 CM Left Cervical LN-MET SQUAM; Carbo-taxol [Nov 9th]with RT [Nov 14th];  Cont Weekly Carbo- RT; JAN 3rd- 2017-PET stable esophageal uptake/slight increase neck adenopathy- s/p local radiation to the neck with carboplatin-   # FEB 20th- START FOLFIRI s/p 5 cycles-May 2017- PET- Stable-esophageal uptake mediastinal LN/ Improved Left neck LN;   # June 26 PET- PROGRESSION; START ABRAXANE x3 cycles- Progression Left neck  #   # TAXOL HELD sec to ACUTE INFUSION REACTIONS  # malnutrition s/p PEG tube  # CKD [stage III]; Hx of non-invasive bladder ca [s/p cystectomy with neo-bladder; [facility]]  # Foundation One/MMR- 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  Patient notes to have increasing swelling/of his left neck adenopathy; denies any pain in the neck.  Patient continues to have cough with sputum; no fever no chills. No unusual shortness of breath. His fatigue. He is taking oxycodone liquid- for his right-sided chest wall pain. No hemoptysis.  He continues to use his PEG tube ; weight stable.  He has been using 4 cans of tube feeds every day. He has lost weight.He is not on any by mouth.  No significant diarrhea. No tingling or numbness.    ROS: A complete 10 point review of system  is done which is negative except mentioned above in history of present illness.  MEDICAL HISTORY:  Past Medical History:  Diagnosis Date  . Bladder cancer [facility])    s/p surgery and reconstruction  . Chronic kidney disease    stage 3  . Emphysema of lung ([place])   . Esophageal cancer ([place])   . Hypertension     SURGICAL HISTORY: Past Surgical History:  Procedure Laterality Date  . BLADDER REMOVAL    . ESOPHAGOGASTRODUODENOSCOPY N/A 05/22/2015   Procedure: ESOPHAGOGASTRODUODENOSCOPY (EGD);  Surgeon: Manya Silvas, MD;  Location: [facility] ENDOSCOPY;  Service: Endoscopy;  Laterality: N/A;  . ESOPHAGOGASTRODUODENOSCOPY (EGD) WITH PROPOFOL N/A 01/21/2015   Procedure: ESOPHAGOGASTRODUODENOSCOPY (EGD) WITH PROPOFOL-looking in the esophagus stomach and upper small intestine to evaluate and treat;  Surgeon: [name], MD;  Location: [facility] ENDOSCOPY;  Service: Endoscopy;  Laterality: N/A;  . GASTROSTOMY W/ FEEDING TUBE Left    placed end of october 2016  . LYMPH NODE BIOPSY N/A 02/04/2015   Procedure: LYMPH NODE BIOPSY;  Surgeon: [name], MD;  Location: [facility] ORS;  Service: General;  Laterality: N/A;  . PORTACATH PLACEMENT Right 02/04/2015   Procedure: INSERTION PORT-A-CATH;  Surgeon: [name], MD;  Location: [facility] ORS;  Service: General;  Laterality: Right;    SOCIAL HISTORY: Social History   Social History  . Marital status: Married    Spouse name: N/A  . Number of children: N/A  . Years of education:  N/A   Occupational History  . Not on file.   Social History Main Topics  . Smoking status: Current Every Day Smoker    Packs/day: 0.50    Years: 47.00    Types: Cigarettes  . Smokeless tobacco: Former Systems developer    Types: Chew  . Alcohol use No     Comment: quit drinking 3 months ago  . Drug use: No  . Sexual activity: Not on file   Other Topics Concern  . Not on file   Social History Narrative   Lives at home with wife. Independent at baseline.    FAMILY  HISTORY: Family History  Problem Relation Age of Onset  . CAD Father   . Breast cancer Mother     ALLERGIES:  is allergic to aspirin and taxol [paclitaxel].  MEDICATIONS:  Current Outpatient Prescriptions  Medication Sig Dispense Refill  . benzonatate (TESSALON) 100 MG capsule Take 1 capsule (100 mg total) by mouth 3 (three) times daily as needed for cough. 45 capsule 0  . diphenoxylate-atropine (LOMOTIL) 2.5-0.025 MG tablet Take 1 tablet by mouth 4 (four) times daily as needed for diarrhea or loose stools. Also take 1 pills 30-60 minutes prior to starting chemotherapy/week cycle. 40 tablet 3  . lidocaine-prilocaine (EMLA) cream Apply cream 1 hour before chemotherapy treatment, place a small amount of saran wrap over the cream to protect your clothing 30 g 1  . neomycin-bacitracin-polymyxin (NEOSPORIN) OINT Apply 1 application topically daily. 30 g 1  . Nutritional Supplements (FEEDING SUPPLEMENT, JEVITY 1.5 CAL/FIBER,) LIQD Place 237 mLs into feeding tube 6 (six) times daily. 1000 mL 5  . ondansetron (ZOFRAN) 8 MG tablet Take 1 tablet (8 mg total) by mouth every 8 (eight) hours as needed for nausea or vomiting (start 3 days; after chemo). 40 tablet 3  . oxyCODONE (ROXICODONE) 5 MG/5ML solution Place 5 mLs (5 mg total) into feeding tube every 6 (six) hours as needed for severe pain. 120 mL 0  . oxyCODONE (ROXICODONE) 5 MG/5ML solution Place 5 mLs (5 mg total) into feeding tube every 6 (six) hours as needed for severe pain. 120 mL 0  . prochlorperazine (COMPAZINE) 10 MG tablet Take 1 tablet (10 mg total) by mouth every 6 (six) hours as needed for nausea or vomiting. 40 tablet 3  . zolpidem (AMBIEN) 5 MG tablet Take 1 tablet (5 mg total) by mouth at bedtime as needed for sleep. 30 tablet 5   No current facility-administered medications for this visit.    Facility-Administered Medications Ordered in Other Visits  Medication Dose Route Frequency Provider Last Rate Last Dose  . sodium chloride  0.9 % injection 10 mL  10 mL Intracatheter PRN Cammie Sickle, MD   10 mL at 03/04/15 0933      .  PHYSICAL EXAMINATION: ECOG PERFORMANCE STATUS: 1 - Symptomatic but completely ambulatory  Vitals:   01/18/16 1433  BP: (!) 90/57  Pulse: (!) 102  Resp: 18  Temp: 98.4 F (36.9 C)   There were no vitals filed for this visit.  GENERAL: Thin built; moderately nourished. Alert, no distress.he is Accompanied by his daughter. He is walking.  EYES: no pallor or icterus OROPHARYNX: no thrush or ulceration; good dentition  NECK: supple, no masses felt LYMPH:  4cm  CM palpable lymphadenopathy in the cervical on left side  LUNGS: Decreased breath sounds bilaterally No wheeze or crackles HEART/CVS: regular rate & rhythm and no murmurs; No lower extremity edema ABDOMEN: abdomen soft, non-tender and  normal bowel sounds; PEG tube in place Musculoskeletal:no cyanosis of digits and no clubbing  PSYCH: alert & oriented x 3 with fluent speech NEURO: no focal motor/sensory deficits SKIN:  no rashes or significant lesions  LABORATORY DATA:  I have reviewed the data as listed Lab Results  Component Value Date   WBC 9.8 01/18/2016   HGB 9.3 (L) 01/18/2016   HCT 26.7 (L) 01/18/2016   MCV 86.9 01/18/2016   PLT 260 01/18/2016    Recent Labs  12/04/15 0931 12/28/15 1415 01/04/16 1357 01/18/16 1411  NA 134* 133* 131* 130*  K 4.5 4.1 4.1 4.0  CL 100* 97* 95* 88*  CO2 '25 29 28 ' 32  GLUCOSE 108* 90 113* 96  BUN 34* 43* 42* 49*  CREATININE 1.60* 1.37* 1.46* 1.61*  CALCIUM 9.0 8.7* 8.7* 8.8*  GFRNONAA 44* 53* 49* 44*  GFRAA 51* >60 57* 51*  PROT 7.9 8.0  --  7.8  ALBUMIN 2.4* 3.0*  --  3.0*  AST 14* 16  --  17  ALT 10* 8*  --  9*  ALKPHOS 60 52  --  49  BILITOT 0.3 0.4  --  <0.1*     ASSESSMENT & PLAN:   Cancer of middle third of esophagus (HCC) # 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 Status post cycle # 3- clinically  progression noted especially in neck. Check a PET scan to rule out distant metastatic disease.   # Discussed with Dr. Donella Stade; if no evidence of any distant metastatic disease- possibly recommend radiation to the left neck enlarging lymph node. Also discussed regarding use of FOLFOX chemotherapy every 2 weeks. The dose of oxaliplatin has to be slightly reduced because of his chronic kidney disease/bone marrow suppression thrombocytopenia.  # RUL Pneumonia/aspiration- status post antibiotics. No fevers no white count. Chest x-ray improved. However patient still coughing. Await above imaging.  # pain - continue oxycodone liquid. Stable.   # Follow-up with me after the PET scan/start FOLFOX chemotherapy/ also see Dr.Crystal.     Cammie Sickle, MD 01/18/2016 4:48 PM

## 2016-01-18 NOTE — Assessment & Plan Note (Addendum)
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 Status post cycle # 3- clinically progression noted especially in neck. Check a PET scan to rule out distant metastatic disease.   # Discussed with Dr. Donella Stade; if no evidence of any distant metastatic disease- possibly recommend radiation to the left neck enlarging lymph node. Also discussed regarding use of FOLFOX chemotherapy every 2 weeks. The dose of oxaliplatin has to be slightly reduced because of his chronic kidney disease/bone marrow suppression thrombocytopenia.  # RUL Pneumonia/aspiration- status post antibiotics. No fevers no white count. Chest x-ray improved. However patient still coughing. Await above imaging.  # pain - continue oxycodone liquid. Stable.   # Follow-up with me after the PET scan/start FOLFOX chemotherapy/ also see Dr.Crystal.

## 2016-01-18 NOTE — Progress Notes (Signed)
START OFF PATHWAY REGIMEN - Gastroesophageal  Off Pathway: FOLFOX (q14d)  [id number]:FOLFOX (q14d):   A cycle is every 14 days:     Oxaliplatin (Eloxatin(R)) 85 mg/m2 in 250 mL D5W IV over 2 hours day 1 Dose Mod: None     Leucovorin 400 mg/m2 in 250 mL D5W IV over 2 hours day 1 followed immediately by Dose Mod: None     5-Fluorouracil 400 mg/m2 IV bolus over 2-4 minutes day 1 Dose Mod: None     5-Fluorouracil 2,400 mg/m2 in _____mL NS IV as a 46 hour infusion day 1 Dose Mod: None Additional Orders: Schedule next Chemotherapy  **Always confirm dose/schedule in your pharmacy ordering system**    Patient Characteristics: Esophageal, Squamous Cell, Stage IV, Metastatic / Locally Recurrent Disease, Second Line and Beyond, MSS / pMMR Histology: Squamous Cell Disease Classification: Esophageal AJCC M Stage: X AJCC N Stage: X AJCC Stage Grouping: IV Current Disease Status: Distant Metastases Tumor Grade: X AJCC T Stage: X Tumor Location: X Line of therapy: Second Line and Beyond Would you be surprised if this patient died  in the next year? I would be surprised if this patient died in the next year Microsatellite/Mismatch Repair Status: MSS/pMMR  Intent of Therapy: Non-Curative / Palliative Intent, Discussed with Patient

## 2016-01-19 ENCOUNTER — Other Ambulatory Visit: Payer: Self-pay

## 2016-01-19 DIAGNOSIS — C154 Malignant neoplasm of middle third of esophagus: Secondary | ICD-10-CM

## 2016-01-22 ENCOUNTER — Ambulatory Visit
Admission: RE | Admit: 2016-01-22 | Discharge: 2016-01-22 | Disposition: A | Discharge status: Home / Self Care | Payer: Self-pay | Source: Ambulatory Visit | Attending: Internal Medicine | Admitting: Internal Medicine

## 2016-01-22 DIAGNOSIS — C154 Malignant neoplasm of middle third of esophagus: Secondary | ICD-10-CM | POA: Insufficient documentation

## 2016-01-22 LAB — GLUCOSE, CAPILLARY: Glucose-Capillary: 79 mg/dL (ref 65–99)

## 2016-01-22 MED ORDER — FLUDEOXYGLUCOSE F - 18 (FDG) INJECTION
13.1700 | Freq: Once | INTRAVENOUS | Status: AC | PRN
  Administered 2016-01-22: 13.17 via INTRAVENOUS

## 2016-01-25 ENCOUNTER — Inpatient Hospital Stay: Payer: Self-pay

## 2016-01-25 ENCOUNTER — Inpatient Hospital Stay (HOSPITAL_BASED_OUTPATIENT_CLINIC_OR_DEPARTMENT_OTHER): Payer: Self-pay | Admitting: Internal Medicine

## 2016-01-25 ENCOUNTER — Encounter: Payer: Self-pay | Admitting: Radiation Oncology

## 2016-01-25 ENCOUNTER — Ambulatory Visit
Admission: RE | Admit: 2016-01-25 | Discharge: 2016-01-25 | Disposition: A | Discharge status: Home / Self Care | Payer: Self-pay | Source: Ambulatory Visit | Attending: Radiation Oncology | Admitting: Radiation Oncology

## 2016-01-25 VITALS — BP 101/61 | HR 97 | Temp 97.2°F | Resp 20 | Wt 111.1 lb

## 2016-01-25 VITALS — BP 96/64 | HR 91 | Temp 97.0°F | Resp 18 | Wt 111.2 lb

## 2016-01-25 DIAGNOSIS — E46 Unspecified protein-calorie malnutrition: Secondary | ICD-10-CM

## 2016-01-25 DIAGNOSIS — C154 Malignant neoplasm of middle third of esophagus: Secondary | ICD-10-CM

## 2016-01-25 DIAGNOSIS — C159 Malignant neoplasm of esophagus, unspecified: Secondary | ICD-10-CM

## 2016-01-25 DIAGNOSIS — R634 Abnormal weight loss: Secondary | ICD-10-CM

## 2016-01-25 DIAGNOSIS — D696 Thrombocytopenia, unspecified: Secondary | ICD-10-CM

## 2016-01-25 DIAGNOSIS — R0789 Other chest pain: Secondary | ICD-10-CM

## 2016-01-25 DIAGNOSIS — Z923 Personal history of irradiation: Secondary | ICD-10-CM

## 2016-01-25 DIAGNOSIS — I129 Hypertensive chronic kidney disease with stage 1 through stage 4 chronic kidney disease, or unspecified chronic kidney disease: Secondary | ICD-10-CM

## 2016-01-25 DIAGNOSIS — Z79899 Other long term (current) drug therapy: Secondary | ICD-10-CM

## 2016-01-25 DIAGNOSIS — N183 Chronic kidney disease, stage 3 (moderate): Secondary | ICD-10-CM

## 2016-01-25 DIAGNOSIS — Z931 Gastrostomy status: Secondary | ICD-10-CM

## 2016-01-25 DIAGNOSIS — C770 Secondary and unspecified malignant neoplasm of lymph nodes of head, face and neck: Secondary | ICD-10-CM | POA: Insufficient documentation

## 2016-01-25 DIAGNOSIS — Z9221 Personal history of antineoplastic chemotherapy: Secondary | ICD-10-CM | POA: Insufficient documentation

## 2016-01-25 DIAGNOSIS — F17210 Nicotine dependence, cigarettes, uncomplicated: Secondary | ICD-10-CM

## 2016-01-25 DIAGNOSIS — J439 Emphysema, unspecified: Secondary | ICD-10-CM

## 2016-01-25 DIAGNOSIS — Z803 Family history of malignant neoplasm of breast: Secondary | ICD-10-CM

## 2016-01-25 LAB — COMPREHENSIVE METABOLIC PANEL
ALT: 9 U/L — AB (ref 17–63)
AST: 18 U/L (ref 15–41)
Albumin: 3.3 g/dL — ABNORMAL LOW (ref 3.5–5.0)
Alkaline Phosphatase: 51 U/L (ref 38–126)
Anion gap: 12 (ref 5–15)
BUN: 66 mg/dL — AB (ref 6–20)
CHLORIDE: 86 mmol/L — AB (ref 101–111)
CO2: 31 mmol/L (ref 22–32)
CREATININE: 1.84 mg/dL — AB (ref 0.61–1.24)
Calcium: 8.9 mg/dL (ref 8.9–10.3)
GFR calc Af Amer: 43 mL/min — ABNORMAL LOW (ref >60)
GFR calc non Af Amer: 37 mL/min — ABNORMAL LOW (ref >60)
Glucose, Bld: 101 mg/dL — ABNORMAL HIGH (ref 65–99)
Potassium: 3.9 mmol/L (ref 3.5–5.1)
SODIUM: 129 mmol/L — AB (ref 135–145)
Total Bilirubin: 0.6 mg/dL (ref 0.3–1.2)
Total Protein: 8.3 g/dL — ABNORMAL HIGH (ref 6.5–8.1)

## 2016-01-25 LAB — CBC WITH DIFFERENTIAL/PLATELET
BASOS ABS: 0 10*3/uL (ref 0–0.1)
Basophils Relative: 0 %
EOS ABS: 0 10*3/uL (ref 0–0.7)
EOS PCT: 0 %
HCT: 27.8 % — ABNORMAL LOW (ref 40.0–52.0)
Hemoglobin: 9.2 g/dL — ABNORMAL LOW (ref 13.0–18.0)
LYMPHS PCT: 15 %
Lymphs Abs: 1.6 10*3/uL (ref 1.0–3.6)
MCH: 28.6 pg (ref 26.0–34.0)
MCHC: 33.3 g/dL (ref 32.0–36.0)
MCV: 85.9 fL (ref 80.0–100.0)
Monocytes Absolute: 1.5 10*3/uL — ABNORMAL HIGH (ref 0.2–1.0)
Monocytes Relative: 14 %
Neutro Abs: 7.8 10*3/uL — ABNORMAL HIGH (ref 1.4–6.5)
Neutrophils Relative %: 71 %
PLATELETS: 301 10*3/uL (ref 150–440)
RBC: 3.23 MIL/uL — AB (ref 4.40–5.90)
RDW: 18.9 % — ABNORMAL HIGH (ref 11.5–14.5)
WBC: 11 10*3/uL — AB (ref 3.8–10.6)

## 2016-01-25 MED ORDER — OXALIPLATIN CHEMO INJECTION 100 MG/20ML
65.0000 mg/m2 | Freq: Once | INTRAVENOUS | Status: AC
  Administered 2016-01-25: 100 mg via INTRAVENOUS
  Filled 2016-01-25 (×2): qty 20

## 2016-01-25 MED ORDER — DEXTROSE 5 % IV SOLN
Freq: Once | INTRAVENOUS | Status: AC
  Administered 2016-01-25: 12:00:00 via INTRAVENOUS
  Filled 2016-01-25: qty 1000

## 2016-01-25 MED ORDER — SODIUM CHLORIDE 0.9% FLUSH
10.0000 mL | Freq: Once | INTRAVENOUS | Status: DC
  Filled 2016-01-25: qty 10

## 2016-01-25 MED ORDER — SODIUM CHLORIDE 0.9% FLUSH
10.0000 mL | INTRAVENOUS | Status: DC | PRN
Start: 2016-01-25 — End: 2016-01-25
  Administered 2016-01-25: 10 mL
  Filled 2016-01-25: qty 10

## 2016-01-25 MED ORDER — SODIUM CHLORIDE 0.9 % IV SOLN: 10.0000 mg | Freq: Once | INTRAVENOUS | Status: DC

## 2016-01-25 MED ORDER — DEXAMETHASONE SODIUM PHOSPHATE 10 MG/ML IJ SOLN
10.0000 mg | Freq: Once | INTRAMUSCULAR | Status: AC
Start: 2016-01-25 — End: 2016-01-25
  Administered 2016-01-25: 10 mg via INTRAVENOUS
  Filled 2016-01-25: qty 1

## 2016-01-25 MED ORDER — SODIUM CHLORIDE 0.9 % IV SOLN
2400.0000 mg/m2 | INTRAVENOUS | Status: DC
  Administered 2016-01-25: 3750 mg via INTRAVENOUS
  Filled 2016-01-25: qty 75

## 2016-01-25 MED ORDER — LEUCOVORIN CALCIUM INJECTION 350 MG
600.0000 mg | Freq: Once | INTRAVENOUS | Status: AC
  Administered 2016-01-25: 600 mg via INTRAVENOUS
  Filled 2016-01-25 (×2): qty 30

## 2016-01-25 MED ORDER — MORPHINE SULFATE 2 MG/ML IJ SOLN
2.0000 mg | Freq: Once | INTRAMUSCULAR | Status: AC
  Administered 2016-01-25: 2 mg via INTRAVENOUS
  Filled 2016-01-25: qty 1

## 2016-01-25 MED ORDER — PALONOSETRON HCL INJECTION 0.25 MG/5ML
0.2500 mg | Freq: Once | INTRAVENOUS | Status: AC
  Administered 2016-01-25: 0.25 mg via INTRAVENOUS
  Filled 2016-01-25: qty 5

## 2016-01-25 NOTE — Progress Notes (Signed)
Radiation Oncology Follow up Note  Name: Jeffrey [name]   Date:   01/25/2016 MRN:  [id number] DOB: 02-Apr-1951    This 65 y.o. male presents to the clinic today for follow-up for stage IV esophageal cancer with progression of disease in his left neck.  REFERRING PROVIDER: Adin Hector, MD  HPI: Patient is a 65 year old male previous a treated for stage IV midesophagus squamous cell carcinoma with palliative treatment to 5000 cGy to his esophagus as well as 3000 cGy to progressive disease in his left neck.. He has been treated with Abraxane as well as Taxol currently on FOLFOX chemotherapy. He is somewhat malnourished although did have a PEG tube placed. I been asked to evaluate him for progressive disease in his left neck which is already received 3000 cGy approximate year prior. He's having no pain skin ulceration or dysphagia at this time. PET CT scan demonstrated hypermetabolic activity in the left neck as well as in the esophageal region of prior treatment.  COMPLICATIONS OF TREATMENT: none  FOLLOW UP COMPLIANCE: keeps appointments   PHYSICAL EXAM:  BP 101/61   Pulse 97   Temp 97.2 F (36.2 C)   Resp 20   Wt 111 lb 1.8 oz (50.4 kg)   BMI 17.40 kg/m  Thin Male in NAD has a large 3 cm mass in the mid cervical chain with no evidence of ulceration. Patient does have a PEG tube placed. Well-developed well-nourished patient in NAD. HEENT reveals PERLA, EOMI, discs not visualized.  Oral cavity is clear. No oral mucosal lesions are identified. Neck is clear without evidence of cervical or supraclavicular adenopathy. Lungs are clear to A&P. Cardiac examination is essentially unremarkable with regular rate and rhythm without murmur rub or thrill. Abdomen is benign with no organomegaly or masses noted. Motor sensory and DTR levels are equal and symmetric in the upper and lower extremities. Cranial nerves II through XII are grossly intact. Proprioception is intact. No peripheral adenopathy or  edema is identified. No motor or sensory levels are noted. Crude visual fields are within normal range.  RADIOLOGY RESULTS: PET CT scan is reviewed and compatible with the above-stated findings.  PLAN: At this time patient is progressive disease in his left neck as well as esophageal region with stage IV squamous cell carcinoma the midesophagus. I really see no advantage at this time to treat palliatively to his left neck since is not causing pain or dysphagia or any evidence of ulceration of the skin at this time. I discussed the case personally with medical oncology and would favor continuing on FOLFOX chemotherapy to see we no systemic response to his treatment. I would be happy to reevaluate the patient any time for palliation to this area should he develop dysphasia pain or ulceration of the skin.  I would like to take this opportunity to thank you for allowing me to participate in the care of your patient.Armstead Peaks., MD

## 2016-01-25 NOTE — Assessment & Plan Note (Addendum)
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 Status post cycle # 3; PT scan shows-Progression noted especially in neck/esophagus; ? Lung.    # recommend starting FOLFOX chemo- today.; ox- dose reduced. disscused re: neuropathy with oxaliplatin.   # CKD creat 1.6--2.0; today 1.8. Monitor for now.   # RUL Pneumonia/aspiration- status post antibiotics; improved; ? Residual malignancy.   # pain - continue oxycodone liquid. Stable.   # labs in 1 week/ follow up in 2 weeks/chemo-FOLFOX. Offered to his daughter; he will let me know.

## 2016-01-25 NOTE — Progress Notes (Signed)
.[place] NOTE  Patient Care Team: Adin Hector, MD as PCP - General (Internal Medicine) Clent Jacks, RN as Registered Nurse  Oncology History   # OCT 2016- SQUAMOUS CELL CA of middle esophagus; Stage IV [EGD bx]; PET- Esopahgeal uptake & 2 CM Left Cervical LN-MET SQUAM; Carbo-taxol [Nov 9th]with RT [Nov 14th];  Cont Weekly Carbo- RT; JAN 3rd- 2017-PET stable esophageal uptake/slight increase neck adenopathy- s/p local radiation to the neck with carboplatin-   # FEB 20th- START FOLFIRI s/p 5 cycles-May 2017- PET- Stable-esophageal uptake mediastinal LN/ Improved Left neck LN;   # June 26 PET- PROGRESSION; START ABRAXANE x3 cycles- Progression Left neck  #   # TAXOL HELD sec to ACUTE INFUSION REACTIONS  # malnutrition s/p PEG tube  # CKD [stage III]; Hx of non-invasive bladder ca [s/p cystectomy with neo-bladder; [facility]]  # Foundation One/MMR- Oct 16th 2017     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 Patient continues to have cough with sputum; no fever no chills. No unusual shortness of breath.   He continues to use oxycodone for his right chest wall pain.  He continues to use his PEG tube ; weight stable.  He has been using 4 cans of tube feeds every day. He has lost weight.He is not on any by mouth.  No significant diarrhea. No tingling or numbness.    ROS: A complete 10  point review of system is done which is negative except mentioned above in history of present illness.  MEDICAL HISTORY:  Past Medical History:  Diagnosis Date  . Bladder cancer [facility])    s/p surgery and reconstruction  . Chronic kidney disease    stage 3  . Emphysema of lung ([place])   . Esophageal cancer ([place])   . Hypertension     SURGICAL HISTORY: Past Surgical History:  Procedure Laterality Date  . BLADDER REMOVAL    . ESOPHAGOGASTRODUODENOSCOPY N/A 05/22/2015   Procedure: ESOPHAGOGASTRODUODENOSCOPY (EGD);  Surgeon: Manya Silvas, MD;  Location: [facility] ENDOSCOPY;  Service: Endoscopy;  Laterality: N/A;  . ESOPHAGOGASTRODUODENOSCOPY (EGD) WITH PROPOFOL N/A 01/21/2015   Procedure: ESOPHAGOGASTRODUODENOSCOPY (EGD) WITH PROPOFOL-looking in the esophagus stomach and upper small intestine to evaluate and treat;  Surgeon: [name], MD;  Location: [facility] ENDOSCOPY;  Service: Endoscopy;  Laterality: N/A;  . GASTROSTOMY W/ FEEDING TUBE Left    placed end of october 2016  . LYMPH NODE BIOPSY N/A 02/04/2015   Procedure: LYMPH NODE BIOPSY;  Surgeon: [name], MD;  Location: [facility] ORS;  Service: General;  Laterality: N/A;  . PORTACATH PLACEMENT Right 02/04/2015   Procedure: INSERTION PORT-A-CATH;  Surgeon: [name], MD;  Location: [facility] ORS;  Service: General;  Laterality: Right;    SOCIAL HISTORY: Social History   Social History  . Marital status: Married    Spouse name: N/A  .  Number of children: N/A  . Years of education: N/A   Occupational History  . Not on file.   Social History Main Topics  . Smoking status: Current Every Day Smoker    Packs/day: 0.50    Years: 47.00    Types: Cigarettes  . Smokeless tobacco: Former Systems developer    Types: Chew  . Alcohol use No     Comment: quit drinking 3 months ago  . Drug use: No  . Sexual activity: Not on file   Other Topics Concern  . Not on file   Social History Narrative   Lives at home with wife. Independent at baseline.     FAMILY HISTORY: Family History  Problem Relation Age of Onset  . CAD Father   . Breast cancer Mother     ALLERGIES:  is allergic to aspirin and taxol [paclitaxel].  MEDICATIONS:  Current Outpatient Prescriptions  Medication Sig Dispense Refill  . benzonatate (TESSALON) 100 MG capsule Take 1 capsule (100 mg total) by mouth 3 (three) times daily as needed for cough. 45 capsule 0  . diphenoxylate-atropine (LOMOTIL) 2.5-0.025 MG tablet Take 1 tablet by mouth 4 (four) times daily as needed for diarrhea or loose stools. Also take 1 pills 30-60 minutes prior to starting chemotherapy/week cycle. 40 tablet 3  . lidocaine-prilocaine (EMLA) cream Apply cream 1 hour before chemotherapy treatment, place a small amount of saran wrap over the cream to protect your clothing 30 g 1  . neomycin-bacitracin-polymyxin (NEOSPORIN) OINT Apply 1 application topically daily. 30 g 1  . Nutritional Supplements (FEEDING SUPPLEMENT, JEVITY 1.5 CAL/FIBER,) LIQD Place 237 mLs into feeding tube 6 (six) times daily. 1000 mL 5  . ondansetron (ZOFRAN) 8 MG tablet Take 1 tablet (8 mg total) by mouth every 8 (eight) hours as needed for nausea or vomiting (start 3 days; after chemo). 40 tablet 3  . prochlorperazine (COMPAZINE) 10 MG tablet Take 1 tablet (10 mg total) by mouth every 6 (six) hours as needed for nausea or vomiting. 40 tablet 3  . zolpidem (AMBIEN) 5 MG tablet Take 1 tablet (5 mg total) by mouth at bedtime as needed for sleep. 30 tablet 5  . oxyCODONE (ROXICODONE) 5 MG/5ML solution Place 5 mLs (5 mg total) into feeding tube every 6 (six) hours as needed for severe pain. 120 mL 0   No current facility-administered medications for this visit.    Facility-Administered Medications Ordered in Other Visits  Medication Dose Route Frequency Provider Last Rate Last Dose  . sodium chloride 0.9 % injection 10 mL  10 mL Intracatheter PRN Cammie Sickle, MD   10 mL at 03/04/15 0933      .  PHYSICAL  EXAMINATION: ECOG PERFORMANCE STATUS: 1 - Symptomatic but completely ambulatory  Vitals:   01/25/16 1028  BP: 96/64  Pulse: 91  Resp: 18  Temp: 97 F (36.1 C)   Filed Weights   01/25/16 1028  Weight: 111 lb 3.2 oz (50.4 kg)    GENERAL: Thin built; moderately nourished. Alert, no distress he is alone. He is walking.  EYES: no pallor or icterus OROPHARYNX: no thrush or ulceration; good dentition  NECK: supple, no masses felt LYMPH:  4cm  CM palpable lymphadenopathy in the cervical on left side  LUNGS: Decreased breath sounds bilaterally No wheeze or crackles HEART/CVS: regular rate & rhythm and no murmurs; No lower extremity edema ABDOMEN: abdomen soft, non-tender and normal bowel sounds; PEG tube in place Musculoskeletal:no cyanosis of digits and no clubbing  PSYCH: alert & oriented x 3 with fluent speech NEURO: no focal motor/sensory deficits SKIN:  no rashes or significant lesions  LABORATORY DATA:  I have reviewed the data as listed Lab Results  Component Value Date   WBC 11.0 (H) 01/25/2016   HGB 9.2 (L) 01/25/2016   HCT 27.8 (L) 01/25/2016   MCV 85.9 01/25/2016   PLT 301 01/25/2016    Recent Labs  12/28/15 1415 01/04/16 1357 01/18/16 1411 01/25/16 0952  NA 133* 131* 130* 129*  K 4.1 4.1 4.0 3.9  CL 97* 95* 88* 86*  CO2 29 28 32 31  GLUCOSE 90 113* 96 101*  BUN 43* 42* 49* 66*  CREATININE 1.37* 1.46* 1.61* 1.84*  CALCIUM 8.7* 8.7* 8.8* 8.9  GFRNONAA 53* 49* 44* 37*  GFRAA >60 57* 51* 43*  PROT 8.0  --  7.8 8.3*  ALBUMIN 3.0*  --  3.0* 3.3*  AST 16  --  17 18  ALT 8*  --  9* 9*  ALKPHOS 52  --  49 51  BILITOT 0.4  --  0.6 0.6     ASSESSMENT & PLAN:   Cancer of middle third of esophagus (HCC) # 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 Status post cycle # 3; PT scan shows-Progression noted especially in neck/esophagus; ? Lung.    # recommend starting FOLFOX chemo- today.; ox- dose reduced. disscused  re: neuropathy with oxaliplatin.   # CKD creat 1.6--2.0; today 1.8. Monitor for now.   # RUL Pneumonia/aspiration- status post antibiotics; improved; ? Residual malignancy.   # pain - continue oxycodone liquid. Stable.   # labs in 1 week/ follow up in 2 weeks/chemo-FOLFOX. Offered to his daughter; he will let me know.     Cammie Sickle, MD 01/28/2016 5:44 PM

## 2016-01-25 NOTE — Progress Notes (Signed)
Patient is here for follow up, he is doing well 

## 2016-01-26 ENCOUNTER — Other Ambulatory Visit: Payer: Self-pay

## 2016-01-26 ENCOUNTER — Encounter: Payer: Self-pay | Admitting: Internal Medicine

## 2016-01-26 DIAGNOSIS — C154 Malignant neoplasm of middle third of esophagus: Secondary | ICD-10-CM

## 2016-01-27 ENCOUNTER — Inpatient Hospital Stay: Payer: Self-pay

## 2016-01-27 ENCOUNTER — Other Ambulatory Visit: Payer: Self-pay | Admitting: *Deleted

## 2016-01-27 VITALS — BP 91/60 | HR 94 | Temp 98.3°F | Resp 20

## 2016-01-27 DIAGNOSIS — C159 Malignant neoplasm of esophagus, unspecified: Secondary | ICD-10-CM

## 2016-01-27 DIAGNOSIS — C154 Malignant neoplasm of middle third of esophagus: Secondary | ICD-10-CM

## 2016-01-27 MED ORDER — SODIUM CHLORIDE 0.9% FLUSH
10.0000 mL | INTRAVENOUS | Status: DC | PRN
  Administered 2016-01-27: 10 mL
  Filled 2016-01-27: qty 10

## 2016-01-27 MED ORDER — OXYCODONE HCL 5 MG/5ML PO SOLN: 5.0000 mg | Freq: Four times a day (QID) | ORAL | 0 refills | Status: DC | PRN

## 2016-01-27 MED ORDER — HEPARIN SOD (PORK) LOCK FLUSH 100 UNIT/ML IV SOLN
500.0000 [IU] | Freq: Once | INTRAVENOUS | Status: AC | PRN
  Administered 2016-01-27: 500 [IU]
  Filled 2016-01-27: qty 5

## 2016-02-01 ENCOUNTER — Inpatient Hospital Stay: Payer: Self-pay

## 2016-02-01 DIAGNOSIS — C154 Malignant neoplasm of middle third of esophagus: Secondary | ICD-10-CM

## 2016-02-01 LAB — CBC WITH DIFFERENTIAL/PLATELET
BASOS ABS: 0 10*3/uL (ref 0–0.1)
Basophils Relative: 0 %
Eosinophils Absolute: 0 10*3/uL (ref 0–0.7)
Eosinophils Relative: 0 %
HEMATOCRIT: 29.8 % — AB (ref 40.0–52.0)
Hemoglobin: 10 g/dL — ABNORMAL LOW (ref 13.0–18.0)
LYMPHS ABS: 2.3 10*3/uL (ref 1.0–3.6)
LYMPHS PCT: 22 %
MCH: 29 pg (ref 26.0–34.0)
MCHC: 33.4 g/dL (ref 32.0–36.0)
MCV: 86.8 fL (ref 80.0–100.0)
MONO ABS: 0.5 10*3/uL (ref 0.2–1.0)
Monocytes Relative: 5 %
NEUTROS ABS: 7.6 10*3/uL — AB (ref 1.4–6.5)
Neutrophils Relative %: 73 %
Platelets: 229 10*3/uL (ref 150–440)
RBC: 3.43 MIL/uL — AB (ref 4.40–5.90)
RDW: 18.9 % — AB (ref 11.5–14.5)
WBC: 10.4 10*3/uL (ref 3.8–10.6)

## 2016-02-01 LAB — COMPREHENSIVE METABOLIC PANEL
ALT: 9 U/L — AB (ref 17–63)
AST: 20 U/L (ref 15–41)
Albumin: 3.4 g/dL — ABNORMAL LOW (ref 3.5–5.0)
Alkaline Phosphatase: 52 U/L (ref 38–126)
Anion gap: 15 (ref 5–15)
BILIRUBIN TOTAL: 0.5 mg/dL (ref 0.3–1.2)
BUN: 62 mg/dL — AB (ref 6–20)
CO2: 32 mmol/L (ref 22–32)
CREATININE: 1.84 mg/dL — AB (ref 0.61–1.24)
Calcium: 8.9 mg/dL (ref 8.9–10.3)
Chloride: 85 mmol/L — ABNORMAL LOW (ref 101–111)
GFR, EST AFRICAN AMERICAN: 43 mL/min — AB (ref >60)
GFR, EST NON AFRICAN AMERICAN: 37 mL/min — AB (ref >60)
Glucose, Bld: 187 mg/dL — ABNORMAL HIGH (ref 65–99)
Potassium: 3.6 mmol/L (ref 3.5–5.1)
Sodium: 132 mmol/L — ABNORMAL LOW (ref 135–145)
TOTAL PROTEIN: 8.2 g/dL — AB (ref 6.5–8.1)

## 2016-02-08 ENCOUNTER — Inpatient Hospital Stay: Payer: Self-pay

## 2016-02-08 ENCOUNTER — Inpatient Hospital Stay: Payer: Self-pay | Attending: Internal Medicine

## 2016-02-08 ENCOUNTER — Inpatient Hospital Stay (HOSPITAL_BASED_OUTPATIENT_CLINIC_OR_DEPARTMENT_OTHER): Payer: Self-pay | Admitting: Internal Medicine

## 2016-02-08 VITALS — BP 92/64 | HR 101 | Temp 96.0°F | Resp 18 | Wt 109.2 lb

## 2016-02-08 VITALS — BP 90/61 | HR 85 | Temp 96.9°F | Resp 18

## 2016-02-08 DIAGNOSIS — Z931 Gastrostomy status: Secondary | ICD-10-CM

## 2016-02-08 DIAGNOSIS — C159 Malignant neoplasm of esophagus, unspecified: Secondary | ICD-10-CM

## 2016-02-08 DIAGNOSIS — J690 Pneumonitis due to inhalation of food and vomit: Secondary | ICD-10-CM | POA: Insufficient documentation

## 2016-02-08 DIAGNOSIS — J439 Emphysema, unspecified: Secondary | ICD-10-CM

## 2016-02-08 DIAGNOSIS — E46 Unspecified protein-calorie malnutrition: Secondary | ICD-10-CM

## 2016-02-08 DIAGNOSIS — Z803 Family history of malignant neoplasm of breast: Secondary | ICD-10-CM | POA: Insufficient documentation

## 2016-02-08 DIAGNOSIS — N183 Chronic kidney disease, stage 3 (moderate): Secondary | ICD-10-CM | POA: Insufficient documentation

## 2016-02-08 DIAGNOSIS — C154 Malignant neoplasm of middle third of esophagus: Secondary | ICD-10-CM | POA: Insufficient documentation

## 2016-02-08 DIAGNOSIS — Z79899 Other long term (current) drug therapy: Secondary | ICD-10-CM

## 2016-02-08 DIAGNOSIS — Z923 Personal history of irradiation: Secondary | ICD-10-CM | POA: Insufficient documentation

## 2016-02-08 DIAGNOSIS — F17210 Nicotine dependence, cigarettes, uncomplicated: Secondary | ICD-10-CM

## 2016-02-08 DIAGNOSIS — I129 Hypertensive chronic kidney disease with stage 1 through stage 4 chronic kidney disease, or unspecified chronic kidney disease: Secondary | ICD-10-CM

## 2016-02-08 DIAGNOSIS — J90 Pleural effusion, not elsewhere classified: Secondary | ICD-10-CM

## 2016-02-08 DIAGNOSIS — R0789 Other chest pain: Secondary | ICD-10-CM

## 2016-02-08 DIAGNOSIS — Z5111 Encounter for antineoplastic chemotherapy: Secondary | ICD-10-CM | POA: Insufficient documentation

## 2016-02-08 DIAGNOSIS — Z8551 Personal history of malignant neoplasm of bladder: Secondary | ICD-10-CM

## 2016-02-08 DIAGNOSIS — R079 Chest pain, unspecified: Secondary | ICD-10-CM | POA: Insufficient documentation

## 2016-02-08 DIAGNOSIS — C770 Secondary and unspecified malignant neoplasm of lymph nodes of head, face and neck: Secondary | ICD-10-CM

## 2016-02-08 LAB — CBC WITH DIFFERENTIAL/PLATELET
BASOS PCT: 0 %
Basophils Absolute: 0 10*3/uL (ref 0–0.1)
Eosinophils Absolute: 0 10*3/uL (ref 0–0.7)
Eosinophils Relative: 0 %
HEMATOCRIT: 27.3 % — AB (ref 40.0–52.0)
HEMOGLOBIN: 9.3 g/dL — AB (ref 13.0–18.0)
LYMPHS ABS: 1.1 10*3/uL (ref 1.0–3.6)
Lymphocytes Relative: 12 %
MCH: 29.6 pg (ref 26.0–34.0)
MCHC: 34.1 g/dL (ref 32.0–36.0)
MCV: 87 fL (ref 80.0–100.0)
MONO ABS: 1.1 10*3/uL — AB (ref 0.2–1.0)
MONOS PCT: 12 %
NEUTROS ABS: 6.8 10*3/uL — AB (ref 1.4–6.5)
NEUTROS PCT: 76 %
PLATELETS: 164 10*3/uL (ref 150–440)
RBC: 3.14 MIL/uL — ABNORMAL LOW (ref 4.40–5.90)
RDW: 18.8 % — ABNORMAL HIGH (ref 11.5–14.5)
WBC: 9 10*3/uL (ref 3.8–10.6)

## 2016-02-08 LAB — COMPREHENSIVE METABOLIC PANEL
ALT: 8 U/L — AB (ref 17–63)
AST: 19 U/L (ref 15–41)
Albumin: 3.2 g/dL — ABNORMAL LOW (ref 3.5–5.0)
Alkaline Phosphatase: 57 U/L (ref 38–126)
Anion gap: 10 (ref 5–15)
BUN: 70 mg/dL — AB (ref 6–20)
CHLORIDE: 86 mmol/L — AB (ref 101–111)
CO2: 37 mmol/L — AB (ref 22–32)
CREATININE: 1.88 mg/dL — AB (ref 0.61–1.24)
Calcium: 9 mg/dL (ref 8.9–10.3)
GFR calc Af Amer: 42 mL/min — ABNORMAL LOW (ref >60)
GFR calc non Af Amer: 36 mL/min — ABNORMAL LOW (ref >60)
Glucose, Bld: 105 mg/dL — ABNORMAL HIGH (ref 65–99)
Potassium: 3.6 mmol/L (ref 3.5–5.1)
SODIUM: 133 mmol/L — AB (ref 135–145)
Total Bilirubin: 0.2 mg/dL — ABNORMAL LOW (ref 0.3–1.2)
Total Protein: 8.2 g/dL — ABNORMAL HIGH (ref 6.5–8.1)

## 2016-02-08 MED ORDER — SODIUM CHLORIDE 0.9 % IV SOLN: 10.0000 mg | Freq: Once | INTRAVENOUS | Status: DC

## 2016-02-08 MED ORDER — OXYCODONE HCL 5 MG/5ML PO SOLN: 5.0000 mg | Freq: Four times a day (QID) | ORAL | 0 refills | Status: DC | PRN

## 2016-02-08 MED ORDER — SODIUM CHLORIDE 0.9 % IV SOLN
2400.0000 mg/m2 | INTRAVENOUS | Status: DC
  Administered 2016-02-08: 3750 mg via INTRAVENOUS
  Filled 2016-02-08: qty 75

## 2016-02-08 MED ORDER — MORPHINE SULFATE 2 MG/ML IJ SOLN
2.0000 mg | Freq: Once | INTRAMUSCULAR | Status: AC
  Administered 2016-02-08: 2 mg via INTRAVENOUS
  Filled 2016-02-08: qty 1

## 2016-02-08 MED ORDER — OXALIPLATIN CHEMO INJECTION 100 MG/20ML
65.0000 mg/m2 | Freq: Once | INTRAVENOUS | Status: AC
  Administered 2016-02-08: 100 mg via INTRAVENOUS
  Filled 2016-02-08: qty 20

## 2016-02-08 MED ORDER — DEXTROSE 5 % IV SOLN
Freq: Once | INTRAVENOUS | Status: AC
  Administered 2016-02-08: 10:00:00 via INTRAVENOUS
  Filled 2016-02-08: qty 1000

## 2016-02-08 MED ORDER — DEXAMETHASONE SODIUM PHOSPHATE 10 MG/ML IJ SOLN
10.0000 mg | Freq: Once | INTRAMUSCULAR | Status: AC
  Administered 2016-02-08: 10 mg via INTRAVENOUS
  Filled 2016-02-08: qty 1

## 2016-02-08 MED ORDER — SODIUM CHLORIDE 0.9% FLUSH
10.0000 mL | INTRAVENOUS | Status: DC | PRN
  Administered 2016-02-08: 10 mL
  Filled 2016-02-08: qty 10

## 2016-02-08 MED ORDER — LEUCOVORIN CALCIUM INJECTION 350 MG
600.0000 mg | Freq: Once | INTRAVENOUS | Status: AC
  Administered 2016-02-08: 600 mg via INTRAVENOUS
  Filled 2016-02-08: qty 30

## 2016-02-08 MED ORDER — PALONOSETRON HCL INJECTION 0.25 MG/5ML
0.2500 mg | Freq: Once | INTRAVENOUS | Status: AC
  Administered 2016-02-08: 0.25 mg via INTRAVENOUS
  Filled 2016-02-08: qty 5

## 2016-02-08 MED ORDER — HEPARIN SOD (PORK) LOCK FLUSH 100 UNIT/ML IV SOLN
500.0000 [IU] | Freq: Once | INTRAVENOUS | Status: DC | PRN
  Filled 2016-02-08: qty 5

## 2016-02-08 NOTE — Assessment & Plan Note (Addendum)
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   # proceed with cycle #2. Cbc- okay; Hb 9/wbc/platelets- N.   # CKD creat 1.6--2.0; today 1.8. Monitor for now.   # RUL Pneumonia/aspiration- status post antibiotics; improved; ? Residual malignancy.   # chest wall pain - continue oxycodone liquid. Stable. New script given; also given morphine 2mg  IV once today.  # labs in 1 week/ follow up in 2 weeks/chemo-FOLFOX.

## 2016-02-08 NOTE — Progress Notes (Signed)
States continues to have pain in right side of chest due to frequent coughing. Pt requests refill of oxycodone liquid pain medication and also would like pain med ordered while receiving treatment.

## 2016-02-08 NOTE — Progress Notes (Signed)
.[place] NOTE  Patient Care Team: Adin Hector, MD as PCP - General (Internal Medicine) Clent Jacks, RN as Registered Nurse  Oncology History   # OCT 2016- SQUAMOUS CELL CA of middle esophagus; Stage IV [EGD bx]; PET- Esopahgeal uptake & 2 CM Left Cervical LN-MET SQUAM; Carbo-taxol [Nov 9th]with RT [Nov 14th];  Cont Weekly Carbo- RT; JAN 3rd- 2017-PET stable esophageal uptake/slight increase neck adenopathy- s/p local radiation to the neck with carboplatin-   # FEB 20th- START FOLFIRI s/p 5 cycles-May 2017- PET- Stable-esophageal uptake mediastinal LN/ Improved Left neck LN;   # June 26 PET- PROGRESSION; START ABRAXANE x3 cycles- OCT 2017 Progression Left neck  # OCT 23rd FOLFOX q2 W  # TAXOL HELD sec to ACUTE INFUSION REACTIONS  # malnutrition s/p PEG tube  # CKD [stage III]; Hx of non-invasive bladder ca [s/p cystectomy with neo-bladder; [facility]]  # [facility]/MMR- Oct 16th 2017- PDL-1 IHC-0%     Squamous cell esophageal cancer ([place])   05/14/2015 Initial Diagnosis    Squamous cell esophageal cancer (HCC)       Cancer of middle third of esophagus (HCC)     HISTORY OF PRESENTING ILLNESS:  Jeffrey [name] 65 y.o. African-American male with recent diagnosis of stage IV esophageal cancer [oligo-Metastatic] squamous cell carcinoma currently on  FOLFOX every 2 week; s/p cycyle 31 is here for a follow up.  Patient did not notice any significant decrease in the size of his left neck adenopathy.  denies any pain in the neck. Patient continues to have intermittent cough with sputum; no fever no chills. No unusual shortness of breath. He continues to use oxycodone for his right chest wall pain.  He continues to use his PEG tube ; weight stable.  He has been using 4 cans of tube feeds every day.  No significant diarrhea. No new tingling or numbness.    ROS: A complete 10 point review of system is done which is negative except mentioned above in  history of present illness.  MEDICAL HISTORY:  Past Medical History:  Diagnosis Date  . Bladder cancer [facility])    s/p surgery and reconstruction  . Chronic kidney disease    stage 3  . Emphysema of lung ([place])   . Esophageal cancer ([place])   . Hypertension     SURGICAL HISTORY: Past Surgical History:  Procedure Laterality Date  . BLADDER REMOVAL    . ESOPHAGOGASTRODUODENOSCOPY N/A 05/22/2015   Procedure: ESOPHAGOGASTRODUODENOSCOPY (EGD);  Surgeon: Manya Silvas, MD;  Location: [facility] ENDOSCOPY;  Service: Endoscopy;  Laterality: N/A;  . ESOPHAGOGASTRODUODENOSCOPY (EGD) WITH PROPOFOL N/A 01/21/2015   Procedure: ESOPHAGOGASTRODUODENOSCOPY (EGD) WITH PROPOFOL-looking in the esophagus stomach and upper small intestine to evaluate and treat;  Surgeon: [name], MD;  Location: [facility] ENDOSCOPY;  Service: Endoscopy;  Laterality: N/A;  . GASTROSTOMY W/ FEEDING TUBE Left    placed end of october 2016  . LYMPH NODE BIOPSY N/A 02/04/2015   Procedure: LYMPH NODE BIOPSY;  Surgeon: [name], MD;  Location: [facility] ORS;  Service: General;  Laterality: N/A;  . PORTACATH PLACEMENT Right 02/04/2015   Procedure: INSERTION PORT-A-CATH;  Surgeon: [name], MD;  Location: [facility] ORS;  Service: General;  Laterality: Right;    SOCIAL HISTORY: Social History   Social History  . Marital status: Married    Spouse name: N/A  . Number of children: N/A  . Years of education: N/A   Occupational History  . Not  on file.   Social History Main Topics  . Smoking status: Current Every Day Smoker    Packs/day: 0.50    Years: 47.00    Types: Cigarettes  . Smokeless tobacco: Former Systems developer    Types: Chew  . Alcohol use No     Comment: quit drinking 3 months ago  . Drug use: No  . Sexual activity: Not on file   Other Topics Concern  . Not on file   Social History Narrative   Lives at home with wife. Independent at baseline.    FAMILY HISTORY: Family History  Problem Relation Age of Onset  . CAD  Father   . Breast cancer Mother     ALLERGIES:  is allergic to aspirin and taxol [paclitaxel].  MEDICATIONS:  Current Outpatient Prescriptions  Medication Sig Dispense Refill  . benzonatate (TESSALON) 100 MG capsule Take 1 capsule (100 mg total) by mouth 3 (three) times daily as needed for cough. 45 capsule 0  . diphenoxylate-atropine (LOMOTIL) 2.5-0.025 MG tablet Take 1 tablet by mouth 4 (four) times daily as needed for diarrhea or loose stools. Also take 1 pills 30-60 minutes prior to starting chemotherapy/week cycle. 40 tablet 3  . lidocaine-prilocaine (EMLA) cream Apply cream 1 hour before chemotherapy treatment, place a small amount of saran wrap over the cream to protect your clothing 30 g 1  . neomycin-bacitracin-polymyxin (NEOSPORIN) OINT Apply 1 application topically daily. 30 g 1  . Nutritional Supplements (FEEDING SUPPLEMENT, JEVITY 1.5 CAL/FIBER,) LIQD Place 237 mLs into feeding tube 6 (six) times daily. 1000 mL 5  . ondansetron (ZOFRAN) 8 MG tablet Take 1 tablet (8 mg total) by mouth every 8 (eight) hours as needed for nausea or vomiting (start 3 days; after chemo). 40 tablet 3  . oxyCODONE (ROXICODONE) 5 MG/5ML solution Place 5 mLs (5 mg total) into feeding tube every 6 (six) hours as needed for severe pain. 120 mL 0  . prochlorperazine (COMPAZINE) 10 MG tablet Take 1 tablet (10 mg total) by mouth every 6 (six) hours as needed for nausea or vomiting. 40 tablet 3  . zolpidem (AMBIEN) 5 MG tablet Take 1 tablet (5 mg total) by mouth at bedtime as needed for sleep. 30 tablet 5   No current facility-administered medications for this visit.    Facility-Administered Medications Ordered in Other Visits  Medication Dose Route Frequency Provider Last Rate Last Dose  . sodium chloride 0.9 % injection 10 mL  10 mL Intracatheter PRN Cammie Sickle, MD   10 mL at 03/04/15 0933      .  PHYSICAL EXAMINATION: ECOG PERFORMANCE STATUS: 1 - Symptomatic but completely  ambulatory  Vitals:   02/08/16 0918  BP: 92/64  Pulse: (!) 101  Resp: 18  Temp: (!) 96 F (35.6 C)   Filed Weights   02/08/16 0918  Weight: 109 lb 3.2 oz (49.5 kg)    GENERAL: Thin built; moderately nourished. Alert, no distress he is alone. He is walking.  EYES: no pallor or icterus OROPHARYNX: no thrush or ulceration; good dentition  NECK: supple, no masses felt LYMPH:  4--5cm  CM palpable lymphadenopathy in the cervical on left side  LUNGS: Decreased breath sounds bilaterally No wheeze or crackles HEART/CVS: regular rate & rhythm and no murmurs; No lower extremity edema ABDOMEN: abdomen soft, non-tender and normal bowel sounds; PEG tube in place Musculoskeletal:no cyanosis of digits and no clubbing  PSYCH: alert & oriented x 3 with fluent speech NEURO: no focal motor/sensory deficits SKIN:  no rashes or significant lesions  LABORATORY DATA:  I have reviewed the data as listed Lab Results  Component Value Date   WBC 9.0 02/08/2016   HGB 9.3 (L) 02/08/2016   HCT 27.3 (L) 02/08/2016   MCV 87.0 02/08/2016   PLT 164 02/08/2016    Recent Labs  01/25/16 0952 02/01/16 1200 02/08/16 0857  NA 129* 132* 133*  K 3.9 3.6 3.6  CL 86* 85* 86*  CO2 31 32 37*  GLUCOSE 101* 187* 105*  BUN 66* 62* 70*  CREATININE 1.84* 1.84* 1.88*  CALCIUM 8.9 8.9 9.0  GFRNONAA 37* 37* 36*  GFRAA 43* 43* 42*  PROT 8.3* 8.2* 8.2*  ALBUMIN 3.3* 3.4* 3.2*  AST '18 20 19  ' ALT 9* 9* 8*  ALKPHOS 51 52 57  BILITOT 0.6 0.5 0.2*     ASSESSMENT & PLAN:   Cancer of middle third of esophagus (HCC) # 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   # proceed with cycle #2. Cbc- okay; Hb 9/wbc/platelets- N.   # CKD creat 1.6--2.0; today 1.8. Monitor for now.   # RUL Pneumonia/aspiration- status post antibiotics; improved; ? Residual malignancy.   # chest wall pain - continue oxycodone liquid. Stable. New  script given; also given morphine 9m IV once today.  # labs in 1 week/ follow up in 2 weeks/chemo-FOLFOX.     GCammie Sickle MD 02/09/2016 7:48 AM

## 2016-02-10 ENCOUNTER — Encounter: Payer: Self-pay | Admitting: Internal Medicine

## 2016-02-10 ENCOUNTER — Inpatient Hospital Stay: Payer: Self-pay

## 2016-02-10 VITALS — BP 94/62 | HR 84 | Temp 97.1°F | Resp 18

## 2016-02-10 DIAGNOSIS — C801 Malignant (primary) neoplasm, unspecified: Secondary | ICD-10-CM

## 2016-02-10 MED ORDER — HEPARIN SOD (PORK) LOCK FLUSH 100 UNIT/ML IV SOLN
500.0000 [IU] | Freq: Once | INTRAVENOUS | Status: AC
  Administered 2016-02-10: 500 [IU] via INTRAVENOUS

## 2016-02-10 MED ORDER — HEPARIN SOD (PORK) LOCK FLUSH 100 UNIT/ML IV SOLN
INTRAVENOUS | Status: AC
  Filled 2016-02-10: qty 5

## 2016-02-10 MED ORDER — SODIUM CHLORIDE 0.9 % IJ SOLN
10.0000 mL | Freq: Once | INTRAMUSCULAR | Status: AC
  Administered 2016-02-10: 10 mL via INTRAVENOUS
  Filled 2016-02-10: qty 10

## 2016-02-14 ENCOUNTER — Emergency Department: Payer: Self-pay

## 2016-02-14 ENCOUNTER — Observation Stay
Admission: EM | Admit: 2016-02-14 | Discharge: 2016-02-17 | Disposition: A | Discharge status: Home / Self Care | Payer: Self-pay | Attending: Internal Medicine | Admitting: Internal Medicine

## 2016-02-14 ENCOUNTER — Encounter: Payer: Self-pay | Admitting: Emergency Medicine

## 2016-02-14 ENCOUNTER — Other Ambulatory Visit: Payer: Self-pay

## 2016-02-14 DIAGNOSIS — Z931 Gastrostomy status: Secondary | ICD-10-CM | POA: Insufficient documentation

## 2016-02-14 DIAGNOSIS — G8929 Other chronic pain: Secondary | ICD-10-CM | POA: Insufficient documentation

## 2016-02-14 DIAGNOSIS — Z8551 Personal history of malignant neoplasm of bladder: Secondary | ICD-10-CM | POA: Insufficient documentation

## 2016-02-14 DIAGNOSIS — M6281 Muscle weakness (generalized): Secondary | ICD-10-CM

## 2016-02-14 DIAGNOSIS — Z886 Allergy status to analgesic agent status: Secondary | ICD-10-CM | POA: Insufficient documentation

## 2016-02-14 DIAGNOSIS — Z9221 Personal history of antineoplastic chemotherapy: Secondary | ICD-10-CM | POA: Insufficient documentation

## 2016-02-14 DIAGNOSIS — D6481 Anemia due to antineoplastic chemotherapy: Secondary | ICD-10-CM | POA: Insufficient documentation

## 2016-02-14 DIAGNOSIS — J189 Pneumonia, unspecified organism: Secondary | ICD-10-CM

## 2016-02-14 DIAGNOSIS — I129 Hypertensive chronic kidney disease with stage 1 through stage 4 chronic kidney disease, or unspecified chronic kidney disease: Secondary | ICD-10-CM | POA: Insufficient documentation

## 2016-02-14 DIAGNOSIS — Z888 Allergy status to other drugs, medicaments and biological substances status: Secondary | ICD-10-CM | POA: Insufficient documentation

## 2016-02-14 DIAGNOSIS — R531 Weakness: Secondary | ICD-10-CM | POA: Insufficient documentation

## 2016-02-14 DIAGNOSIS — R0781 Pleurodynia: Secondary | ICD-10-CM | POA: Insufficient documentation

## 2016-02-14 DIAGNOSIS — N183 Chronic kidney disease, stage 3 (moderate): Secondary | ICD-10-CM | POA: Insufficient documentation

## 2016-02-14 DIAGNOSIS — Z9889 Other specified postprocedural states: Secondary | ICD-10-CM | POA: Insufficient documentation

## 2016-02-14 DIAGNOSIS — R05 Cough: Secondary | ICD-10-CM | POA: Diagnosis present

## 2016-02-14 DIAGNOSIS — J439 Emphysema, unspecified: Secondary | ICD-10-CM | POA: Insufficient documentation

## 2016-02-14 DIAGNOSIS — C154 Malignant neoplasm of middle third of esophagus: Secondary | ICD-10-CM | POA: Insufficient documentation

## 2016-02-14 DIAGNOSIS — R059 Cough, unspecified: Secondary | ICD-10-CM | POA: Diagnosis present

## 2016-02-14 DIAGNOSIS — Z8249 Family history of ischemic heart disease and other diseases of the circulatory system: Secondary | ICD-10-CM | POA: Insufficient documentation

## 2016-02-14 DIAGNOSIS — Z79891 Long term (current) use of opiate analgesic: Secondary | ICD-10-CM | POA: Insufficient documentation

## 2016-02-14 DIAGNOSIS — C159 Malignant neoplasm of esophagus, unspecified: Secondary | ICD-10-CM

## 2016-02-14 DIAGNOSIS — J690 Pneumonitis due to inhalation of food and vomit: Principal | ICD-10-CM | POA: Insufficient documentation

## 2016-02-14 DIAGNOSIS — F17210 Nicotine dependence, cigarettes, uncomplicated: Secondary | ICD-10-CM | POA: Insufficient documentation

## 2016-02-14 DIAGNOSIS — Z803 Family history of malignant neoplasm of breast: Secondary | ICD-10-CM | POA: Insufficient documentation

## 2016-02-14 DIAGNOSIS — I959 Hypotension, unspecified: Secondary | ICD-10-CM | POA: Insufficient documentation

## 2016-02-14 LAB — CBC
HCT: 32.4 % — ABNORMAL LOW (ref 40.0–52.0)
Hemoglobin: 10.8 g/dL — ABNORMAL LOW (ref 13.0–18.0)
MCH: 29.1 pg (ref 26.0–34.0)
MCHC: 33.2 g/dL (ref 32.0–36.0)
MCV: 87.7 fL (ref 80.0–100.0)
PLATELETS: 149 10*3/uL — AB (ref 150–440)
RBC: 3.69 MIL/uL — AB (ref 4.40–5.90)
RDW: 18.9 % — ABNORMAL HIGH (ref 11.5–14.5)
WBC: 5.3 10*3/uL (ref 3.8–10.6)

## 2016-02-14 LAB — BASIC METABOLIC PANEL
Anion gap: 13 (ref 5–15)
BUN: 96 mg/dL — ABNORMAL HIGH (ref 6–20)
CALCIUM: 9.5 mg/dL (ref 8.9–10.3)
CHLORIDE: 93 mmol/L — AB (ref 101–111)
CO2: 36 mmol/L — ABNORMAL HIGH (ref 22–32)
CREATININE: 1.88 mg/dL — AB (ref 0.61–1.24)
GFR calc non Af Amer: 36 mL/min — ABNORMAL LOW (ref >60)
GFR, EST AFRICAN AMERICAN: 42 mL/min — AB (ref >60)
Glucose, Bld: 106 mg/dL — ABNORMAL HIGH (ref 65–99)
Potassium: 4 mmol/L (ref 3.5–5.1)
SODIUM: 142 mmol/L (ref 135–145)

## 2016-02-14 LAB — TROPONIN I

## 2016-02-14 LAB — LACTIC ACID, PLASMA: LACTIC ACID, VENOUS: 1.9 mmol/L (ref 0.5–1.9)

## 2016-02-14 MED ORDER — OXYCODONE HCL 5 MG/5ML PO SOLN
5.0000 mg | Freq: Four times a day (QID) | ORAL | Status: DC | PRN
  Administered 2016-02-14 – 2016-02-15 (×3): 5 mg
  Filled 2016-02-14 (×3): qty 5

## 2016-02-14 MED ORDER — DEXTROSE 5 % IV SOLN: 1.0000 g | Freq: Once | INTRAVENOUS | Status: DC

## 2016-02-14 MED ORDER — HYDROCODONE-ACETAMINOPHEN 5-325 MG PO TABS: 1.0000 | ORAL_TABLET | ORAL | Status: DC | PRN

## 2016-02-14 MED ORDER — ACETAMINOPHEN 325 MG PO TABS: 650.0000 mg | ORAL_TABLET | Freq: Four times a day (QID) | ORAL | Status: DC | PRN

## 2016-02-14 MED ORDER — HYDROCOD POLST-CPM POLST ER 10-8 MG/5ML PO SUER
5.0000 mL | Freq: Two times a day (BID) | ORAL | Status: DC
  Administered 2016-02-14 – 2016-02-16 (×4): 5 mL via ORAL
  Filled 2016-02-14 (×4): qty 5

## 2016-02-14 MED ORDER — CEFTRIAXONE SODIUM-DEXTROSE 1-3.74 GM-% IV SOLR
1.0000 g | Freq: Once | INTRAVENOUS | Status: AC
  Administered 2016-02-14: 1 g via INTRAVENOUS
  Filled 2016-02-14: qty 50

## 2016-02-14 MED ORDER — DOCUSATE SODIUM 100 MG PO CAPS
100.0000 mg | ORAL_CAPSULE | Freq: Two times a day (BID) | ORAL | Status: DC
  Filled 2016-02-14: qty 1

## 2016-02-14 MED ORDER — SODIUM CHLORIDE 0.9 % IV SOLN
1000.0000 mL | Freq: Once | INTRAVENOUS | Status: AC
  Administered 2016-02-14: 1000 mL via INTRAVENOUS

## 2016-02-14 MED ORDER — JEVITY 1.2 CAL PO LIQD
237.0000 mL | Freq: Every day | ORAL | Status: DC
  Administered 2016-02-15: 237 mL

## 2016-02-14 MED ORDER — DEXTROSE 5 % IV SOLN
500.0000 mg | INTRAVENOUS | Status: DC
  Filled 2016-02-14 (×2): qty 500

## 2016-02-14 MED ORDER — SODIUM CHLORIDE 0.9 % IV SOLN
INTRAVENOUS | Status: DC
  Administered 2016-02-14 – 2016-02-15 (×2): via INTRAVENOUS

## 2016-02-14 MED ORDER — ENOXAPARIN SODIUM 30 MG/0.3ML ~~LOC~~ SOLN: 30.0000 mg | SUBCUTANEOUS | Status: DC

## 2016-02-14 MED ORDER — CEFTRIAXONE SODIUM-DEXTROSE 1-3.74 GM-% IV SOLR: 1.0000 g | Freq: Every day | INTRAVENOUS | Status: DC

## 2016-02-14 MED ORDER — DEXTROSE 5 % IV SOLN
500.0000 mg | Freq: Once | INTRAVENOUS | Status: AC
  Administered 2016-02-14: 500 mg via INTRAVENOUS
  Filled 2016-02-14: qty 500

## 2016-02-14 MED ORDER — ZOLPIDEM TARTRATE 5 MG PO TABS: 5.0000 mg | ORAL_TABLET | Freq: Every evening | ORAL | Status: DC | PRN

## 2016-02-14 MED ORDER — ACETAMINOPHEN 650 MG RE SUPP: 650.0000 mg | Freq: Four times a day (QID) | RECTAL | Status: DC | PRN

## 2016-02-14 MED ORDER — BISACODYL 5 MG PO TBEC: 5.0000 mg | DELAYED_RELEASE_TABLET | Freq: Every day | ORAL | Status: DC | PRN

## 2016-02-14 MED ORDER — CEFTRIAXONE SODIUM 1 G IJ SOLR: 1.0000 g | INTRAMUSCULAR | Status: DC

## 2016-02-14 MED ORDER — ONDANSETRON HCL 4 MG PO TABS: 4.0000 mg | ORAL_TABLET | Freq: Four times a day (QID) | ORAL | Status: DC | PRN

## 2016-02-14 MED ORDER — ONDANSETRON HCL 4 MG/2ML IJ SOLN: 4.0000 mg | Freq: Four times a day (QID) | INTRAMUSCULAR | Status: DC | PRN

## 2016-02-14 NOTE — ED Notes (Signed)
Pt transported to room 113 

## 2016-02-14 NOTE — H&P (Signed)
[place] [facility] [place] NAME: Jeffrey [name]    MR#:  [id number]  DATE OF BIRTH:  [birth year]  DATE OF ADMISSION:  02/14/2016  PRIMARY CARE PHYSICIAN: Jeffrey High III, MD   REQUESTING/REFERRING PHYSICIAN: Dr. Corky Downs  CHIEF COMPLAINT: Shortness of breath, cough    Chief Complaint  Patient presents with  . Cough  . Shortness of Breath    HISTORY OF PRESENT ILLNESS:  Jeffrey [name]  is a 65 y.o. male with a known history of Vaginal cancer status post PEG tube comes in because of cough, shortness of breath for last 2 days. X-ray chest concerning for bilateral pneumonia.  He  has no fever. WBC normal. Tachycardic heart rate up to 1 10 bpm. And has been having cough, pleuritic  chest pain, green phlegm.  PAST MEDICAL HISTORY:   Past Medical History:  Diagnosis Date  . Bladder cancer [facility])    s/p surgery and reconstruction  . Chronic kidney disease    stage 3  . Emphysema of lung ([place])   . Esophageal cancer ([place])   . Hypertension     PAST SURGICAL HISTOIRY:   Past Surgical History:  Procedure Laterality Date  . BLADDER REMOVAL    . ESOPHAGOGASTRODUODENOSCOPY N/A 05/22/2015   Procedure: ESOPHAGOGASTRODUODENOSCOPY (EGD);  Surgeon: Manya Silvas, MD;  Location: [facility] ENDOSCOPY;  Service: Endoscopy;  Laterality: N/A;  . ESOPHAGOGASTRODUODENOSCOPY (EGD) WITH PROPOFOL N/A 01/21/2015   Procedure: ESOPHAGOGASTRODUODENOSCOPY (EGD) WITH PROPOFOL-looking in the esophagus stomach and upper small intestine to evaluate and treat;  Surgeon: [name], MD;  Location: [facility] ENDOSCOPY;  Service: Endoscopy;  Laterality: N/A;  . GASTROSTOMY W/ FEEDING TUBE Left    placed end of october 2016  . LYMPH NODE BIOPSY N/A 02/04/2015   Procedure: LYMPH NODE BIOPSY;  Surgeon: [name], MD;  Location: [facility] ORS;  Service: General;  Laterality: N/A;  . PORTACATH PLACEMENT Right 02/04/2015   Procedure: INSERTION PORT-A-CATH;  Surgeon: [name], MD;   Location: [facility] ORS;  Service: General;  Laterality: Right;    SOCIAL HISTORY:   Social History  Substance Use Topics  . Smoking status: Current Every Day Smoker    Packs/day: 0.50    Years: 47.00    Types: Cigarettes  . Smokeless tobacco: Former Systems developer    Types: Chew  . Alcohol use No     Comment: quit drinking 3 months ago    FAMILY HISTORY:   Family History  Problem Relation Age of Onset  . CAD Father   . Breast cancer Mother     DRUG ALLERGIES:   Allergies  Allergen Reactions  . Aspirin Nausea Only  . Taxol [Paclitaxel] Other (See Comments)    Diaphoretic, flushing and tightness in chest    REVIEW OF SYSTEMS:  CONSTITUTIONAL: No fever, fatigue or weakness.  EYES: No blurred or double vision.  EARS, NOSE, AND THROAT: No tinnitus or ear pain.  RESPIRATORY:  having cough, shortness of breath CARDIOVASCULAR: No chest pain, orthopnea, edema.  GASTROINTESTINAL: No nausea, vomiting, diarrhea or abdominal pain.  GENITOURINARY: No dysuria, hematuria.  ENDOCRINE: No polyuria, nocturia,  HEMATOLOGY: No anemia, easy bruising or bleeding SKIN: No rash or lesion. MUSCULOSKELETAL: No joint pain or arthritis.   NEUROLOGIC: No tingling, numbness, weakness.  PSYCHIATRY: No anxiety or depression.   MEDICATIONS AT HOME:   Prior to Admission medications   Medication Sig Start Date End Date Taking? Authorizing Provider  oxyCODONE (ROXICODONE) 5 MG/5ML solution Place 5  mLs (5 mg total) into feeding tube every 6 (six) hours as needed for severe pain. 02/08/16  Yes Cammie Sickle, MD  zolpidem (AMBIEN) 5 MG tablet Take 1 tablet (5 mg total) by mouth at bedtime as needed for sleep. 12/23/15  Yes Laverle Hobby, MD      VITAL SIGNS:  Blood pressure 101/72, pulse (!) 110, temperature 97.6 F (36.4 C), temperature source Oral, resp. rate 18, height 5\' 8"  (1.727 m), weight 49.4 kg (109 lb), SpO2 100 %.  PHYSICAL EXAMINATION:  GENERAL:  64 y.o.-year-old patient lying in the  bed with no acute distress.  EYES: Pupils equal, round, reactive to light and accommodation. No scleral icterus. Extraocular muscles intact.  HEENT: Head atraumatic, normocephalic. Oropharynx and nasopharynx clear.  NECK:  Supple, no jugular venous distention. No thyroid enlargement, no tenderness.  LUNGS: Normal breath sounds bilaterally, no wheezing, rales,rhonchi or crepitation. No use of accessory muscles of respiration.  CARDIOVASCULAR: S1, S2 normal. No murmurs, rubs, or gallops.  ABDOMEN: Soft, nontender, nondistended. Bowel sounds present. No organomegaly or mass.  EXTREMITIES: No pedal edema, cyanosis, or clubbing.  NEUROLOGIC: Cranial nerves II through XII are intact. Muscle strength 5/5 in all extremities. Sensation intact. Gait not checked.  PSYCHIATRIC: The patient is alert and oriented x 3.  SKIN: No obvious rash, lesion, or ulcer.   LABORATORY PANEL:   CBC  Recent Labs Lab 02/14/16 1603  WBC 5.3  HGB 10.8*  HCT 32.4*  PLT 149*   ------------------------------------------------------------------------------------------------------------------  Chemistries   Recent Labs Lab 02/08/16 0857 02/14/16 1603  NA 133* 142  K 3.6 4.0  CL 86* 93*  CO2 37* 36*  GLUCOSE 105* 106*  BUN 70* 96*  CREATININE 1.88* 1.88*  CALCIUM 9.0 9.5  AST 19  --   ALT 8*  --   ALKPHOS 57  --   BILITOT 0.2*  --    ------------------------------------------------------------------------------------------------------------------  Cardiac Enzymes  Recent Labs Lab 02/14/16 1603  TROPONINI <0.03   ------------------------------------------------------------------------------------------------------------------  RADIOLOGY:  Dg Chest 2 View  Result Date: 02/14/2016 CLINICAL DATA:  Pt states he had PNA several months ago. Cough since but worse last 1-2 wks. Hx - HTN, emphysema, esophogeal cancer, bladder cancer, CKD, port-a-cath 02/2015. Current smoker 0.5 ppd EXAM: CHEST  2 VIEW  COMPARISON:  PET-CT 01/22/2016, radiograph 01/07/2016 FINDINGS: RIGHT port noted. Stable cardiac silhouette. There is new bibasilar faint airspace opacities. Upper lobe thickening and scarring on the RIGHT is unchanged. IMPRESSION: New bibasilar hazy airspace opacities concerning for pneumonia versus neoplastic progression. Stable linear its scarring and thickening in the RIGHT upper lobe. Electronically Signed   By: Suzy Bouchard M.D.   On: 02/14/2016 16:40    EKG:   Orders placed or performed during the hospital encounter of 02/14/16  . ED EKG within 10 minutes  . ED EKG within 10 minutes  Sinus tachycardia 10 1 bpm, respiratory depression in V5, V6.  IMPRESSION AND PLAN:   65 year old the male with history of squamous cell carcinoma mid left esophagus status post PEG, comes in because of cough, shortness of breath, bilateral pneumonia; admitted to observation status because of cough and tachycardia, pneumonia. Start IV hydration, IV antibiotics with Rocephin, Zithromax, obtain sputum cultures, likely discharge home tomorrow. By mouth antibiotics if tachycardia improves. #2 esophageal cancer status PEG' Jvity 5 cans a  Day as bolus feedings Chronic kidney disease stage [name] stable #4 .chest wall pain: Patient takes oxycodone liquid Metastatic squmous cell carcinoma esophagus stage IV, metastatic;  on  chemotherapy. Discussed with wife.  \All the records are reviewed and case discussed with ED provider. ack to his original cancer status postManagement plans discussed with the patient, family and they are in agreement.  CODE STATUS: full  TOTAL TIME TAKING CARE OF THIS PATIENT:3minutes.    Jeffrey [name] M.D on 02/14/2016 at 5:35 PM  Between 7am to 6pm - Pager - [phone]  After 6pm go to [email or web address] - password [email or web address] [facility]  Office  [phone]  CC: Primary care physician; Adin Hector, MD  Note: This dictation was prepared with Dragon  dictation along with smaller phrase technology. Any transcriptional errors that result from this process are unintentional.

## 2016-02-14 NOTE — ED Notes (Signed)
Attempted to call report to floor, nurse unable to take report at this time.

## 2016-02-14 NOTE — Progress Notes (Signed)
Due to pt weight and crcl <30, Orders for lovenox 40mg  q 24 have been changed to 30mg  q 24hr per protocol.  Ramond Dial, Pharm.D Clinical Pharmacist

## 2016-02-14 NOTE — ED Provider Notes (Signed)
[facility] Emergency Department Provider Note   ____________________________________________    I have reviewed the triage vital signs and the nursing notes.   HISTORY  Chief Complaint Cough and Shortness of Breath     HPI Jeffrey [name] is a 65 y.o. male with a history of esophageal cancer with chemotherapy 6 days ago who presents with complaints of weakness, cough and increasing shortness of breath. He reports productive cough yellow sputum, reports chills and diffuse weakness as well. Has not had fevers. No chest pain. No pleurisy. No calf pain or swelling. Does report history of pneumonia several months ago, reports this feels similar. Followed by Dr. Yevette Edwards   Past Medical History:  Diagnosis Date  . Bladder cancer [facility])    s/p surgery and reconstruction  . Chronic kidney disease    stage 3  . Emphysema of lung ([place])   . Esophageal cancer ([place])   . Hypertension     Patient Active Problem List   Diagnosis Date Noted  . Sepsis ([place]) 11/23/2015  . Cough 10/30/2015  . Cancer of middle third of esophagus ([place]) 10/19/2015  . Other iron deficiency anemia 10/05/2015  . Adverse effect of paclitaxel 09/25/2015  . Encounter for antineoplastic chemotherapy 09/21/2015  . Weight loss 09/21/2015  . Anemia due to chemotherapy 09/21/2015  . Diarrhea 09/21/2015  . Status post insertion of percutaneous endoscopic gastrostomy (PEG) tube ([place]) 09/21/2015  . Nausea & vomiting 05/20/2015  . 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 Esophageal cancer ([place]) 02/04/2015  . Esophageal mass 01/21/2015  . Dehydration 01/21/2015  . CKD (chronic kidney disease) stage 3, GFR 30-59 ml/min 01/21/2015  . Bladder cancer ([place]) 01/21/2015  . Protein-calorie malnutrition, severe 01/21/2015  . Dysphagia 01/20/2015    Past Surgical History:  Procedure Laterality Date  . BLADDER REMOVAL    .  ESOPHAGOGASTRODUODENOSCOPY N/A 05/22/2015   Procedure: ESOPHAGOGASTRODUODENOSCOPY (EGD);  Surgeon: Manya Silvas, MD;  Location: [facility] ENDOSCOPY;  Service: Endoscopy;  Laterality: N/A;  . ESOPHAGOGASTRODUODENOSCOPY (EGD) WITH PROPOFOL N/A 01/21/2015   Procedure: ESOPHAGOGASTRODUODENOSCOPY (EGD) WITH PROPOFOL-looking in the esophagus stomach and upper small intestine to evaluate and treat;  Surgeon: [name], MD;  Location: [facility] ENDOSCOPY;  Service: Endoscopy;  Laterality: N/A;  . GASTROSTOMY W/ FEEDING TUBE Left    placed end of october 2016  . LYMPH NODE BIOPSY N/A 02/04/2015   Procedure: LYMPH NODE BIOPSY;  Surgeon: [name], MD;  Location: [facility] ORS;  Service: General;  Laterality: N/A;  . PORTACATH PLACEMENT Right 02/04/2015   Procedure: INSERTION PORT-A-CATH;  Surgeon: [name], MD;  Location: [facility] ORS;  Service: General;  Laterality: Right;    Prior to Admission medications   Medication Sig Start Date End Date Taking? Authorizing Provider  benzonatate (TESSALON) 100 MG capsule Take 1 capsule (100 mg total) by mouth 3 (three) times daily as needed for cough. 01/14/16   Cammie Sickle, MD  diphenoxylate-atropine (LOMOTIL) 2.5-0.025 MG tablet Take 1 tablet by mouth 4 (four) times daily as needed for diarrhea or loose stools. Also take 1 pills 30-60 minutes prior to starting chemotherapy/week cycle. 09/07/15   Cammie Sickle, MD  lidocaine-prilocaine (EMLA) cream Apply cream 1 hour before chemotherapy treatment, place a small amount of saran wrap over the cream to protect your clothing 01/18/16   Cammie Sickle, MD  neomycin-bacitracin-polymyxin (NEOSPORIN) OINT Apply 1 application topically daily. 01/24/15   Tracie Harrier, MD  Nutritional Supplements (FEEDING SUPPLEMENT, JEVITY 1.5 CAL/FIBER,) LIQD Place 237 mLs into feeding tube 6 (six) times daily. 01/24/15   Vishwanath Hande, MD  ondansetron (ZOFRAN) 8 MG tablet Take 1 tablet (8 mg total) by mouth every 8  (eight) hours as needed for nausea or vomiting (start 3 days; after chemo). 09/07/15   Cammie Sickle, MD  oxyCODONE (ROXICODONE) 5 MG/5ML solution Place 5 mLs (5 mg total) into feeding tube every 6 (six) hours as needed for severe pain. 02/08/16   Cammie Sickle, MD  prochlorperazine (COMPAZINE) 10 MG tablet Take 1 tablet (10 mg total) by mouth every 6 (six) hours as needed for nausea or vomiting. 10/05/15   Cammie Sickle, MD  zolpidem (AMBIEN) 5 MG tablet Take 1 tablet (5 mg total) by mouth at bedtime as needed for sleep. 12/23/15   Laverle Hobby, MD     Allergies Aspirin and Taxol [paclitaxel]  Family History  Problem Relation Age of Onset  . CAD Father   . Breast cancer Mother     Social History Social History  Substance Use Topics  . Smoking status: Current Every Day Smoker    Packs/day: 0.50    Years: 47.00    Types: Cigarettes  . Smokeless tobacco: Former Systems developer    Types: Chew  . Alcohol use No     Comment: quit drinking 3 months ago    Review of Systems  Constitutional: Chills as above Eyes: No visual changes.   Cardiovascular: Denies chest pain. Respiratory: As above Gastrointestinal: No abdominal pain.    Genitourinary: Negative for dysuria. Musculoskeletal: No calf pain Skin: Negative for rash. Neurological: Negative for headaches or weakness  10-point ROS otherwise negative.  ____________________________________________   PHYSICAL EXAM:  VITAL SIGNS: ED Triage Vitals [02/14/16 1600]  Enc Vitals Group     BP 101/72     Pulse Rate (!) 110     Resp 18     Temp 97.6 F (36.4 C)     Temp Source Oral     SpO2 100 %     Weight 109 lb (49.4 kg)     Height 5\' 8"  (1.727 m)     Head Circumference      Peak Flow      Pain Score 8     Pain Loc      Pain Edu?      Excl. in [place]?     Constitutional: Alert and oriented.Cachectic. Pleasant and interactive Eyes: Conjunctivae are normal.   Nose: No congestion/rhinnorhea. Mouth/Throat:  Mucous membranes are dry   Cardiovascular: Tachycardia, regular rhythm. Grossly normal heart sounds.  Good peripheral circulation. Respiratory: Bibasilar rales, mild tachypnea.  No retractions.  Gastrointestinal: Soft and nontender. No distention.  No CVA tenderness. Genitourinary: deferred Musculoskeletal: No lower extremity tenderness nor edema.  Warm and well perfused Neurologic:  Normal speech and language. No gross focal neurologic deficits are appreciated.  Skin:  Skin is warm, dry and intact. No rash noted. Psychiatric: Mood and affect are normal. Speech and behavior are normal.  ____________________________________________   LABS (all labs ordered are listed, but only abnormal results are displayed)  Labs Reviewed  BASIC METABOLIC PANEL - Abnormal; Notable for the following:       Result Value   Chloride 93 (*)    CO2 36 (*)    Glucose, Bld 106 (*)    BUN 96 (*)    Creatinine, Ser 1.88 (*)    GFR calc non Af Amer 36 (*)  GFR calc Af Amer 42 (*)    All other components within normal limits  CBC - Abnormal; Notable for the following:    RBC 3.69 (*)    Hemoglobin 10.8 (*)    HCT 32.4 (*)    RDW 18.9 (*)    Platelets 149 (*)    All other components within normal limits  CULTURE, BLOOD (ROUTINE X 2)  CULTURE, BLOOD (ROUTINE X 2)  TROPONIN I  LACTIC ACID, PLASMA  LACTIC ACID, PLASMA   ____________________________________________  EKG  ED ECG REPORT I, Lavonia Drafts, the attending physician, personally viewed and interpreted this ECG.  Date: 02/14/2016 EKG Time: 4:14 PM Rate: 101 Rhythm: Sinus tachycardia QRS Axis: normal Intervals: normal ST/T Wave abnormalities: normal Conduction Disturbances: none   ____________________________________________  RADIOLOGY  Chest x-ray concerning for bibasilar pneumonia versus progression of neoplastic disease ____________________________________________   PROCEDURES  Procedure(s) performed: No    Critical  Care performed: No ____________________________________________   INITIAL IMPRESSION / ASSESSMENT AND PLAN / ED COURSE  Pertinent labs & imaging results that were available during my care of the patient were reviewed by me and considered in my medical decision making (see chart for details).  Patient presents with cough, increasing shortness of breath, increasing weakness and subjective chills. Chest x-ray is concerning for pneumonia this does seem likely given his history. We will treat with Rocephin and azithromycin and admitted to the hospital  Clinical Course    ____________________________________________   FINAL CLINICAL IMPRESSION(S) / ED DIAGNOSES  Final diagnoses:  Community acquired pneumonia, unspecified laterality      NEW MEDICATIONS STARTED DURING THIS VISIT:  New Prescriptions   No medications on file     Note:  This document was prepared using Dragon voice recognition software and may include unintentional dictation errors.    Lavonia Drafts, MD 02/14/16 [phone]

## 2016-02-14 NOTE — ED Triage Notes (Signed)
C/O Productive cough x 2-3 months.  Seen through ED 2-3 months ago and diagnosed with pneumonia.  Has followed up with PCP and finished coarse of antibiotics.  Patient states cough has worsened and feeling SOB x 2-3 days.  Patient has esophageal cancer and is currently receiving chemo.

## 2016-02-15 LAB — CBC
HEMATOCRIT: 27.4 % — AB (ref 40.0–52.0)
HEMOGLOBIN: 8.9 g/dL — AB (ref 13.0–18.0)
MCH: 28.8 pg (ref 26.0–34.0)
MCHC: 32.5 g/dL (ref 32.0–36.0)
MCV: 88.8 fL (ref 80.0–100.0)
Platelets: 118 10*3/uL — ABNORMAL LOW (ref 150–440)
RBC: 3.08 MIL/uL — ABNORMAL LOW (ref 4.40–5.90)
RDW: 18.8 % — AB (ref 11.5–14.5)
WBC: 4.8 10*3/uL (ref 3.8–10.6)

## 2016-02-15 LAB — BASIC METABOLIC PANEL
ANION GAP: 7 (ref 5–15)
BUN: 85 mg/dL — AB (ref 6–20)
CHLORIDE: 102 mmol/L (ref 101–111)
CO2: 34 mmol/L — ABNORMAL HIGH (ref 22–32)
Calcium: 8.6 mg/dL — ABNORMAL LOW (ref 8.9–10.3)
Creatinine, Ser: 1.65 mg/dL — ABNORMAL HIGH (ref 0.61–1.24)
GFR calc Af Amer: 49 mL/min — ABNORMAL LOW (ref >60)
GFR, EST NON AFRICAN AMERICAN: 43 mL/min — AB (ref >60)
GLUCOSE: 103 mg/dL — AB (ref 65–99)
POTASSIUM: 3.6 mmol/L (ref 3.5–5.1)
SODIUM: 143 mmol/L (ref 135–145)

## 2016-02-15 LAB — GLUCOSE, CAPILLARY: Glucose-Capillary: 169 mg/dL — ABNORMAL HIGH (ref 65–99)

## 2016-02-15 MED ORDER — DEXTROSE 5 % IV SOLN
500.0000 mg | INTRAVENOUS | Status: DC
  Administered 2016-02-15: 20:00:00 500 mg via INTRAVENOUS
  Filled 2016-02-15 (×2): qty 500

## 2016-02-15 MED ORDER — JEVITY 1.5 CAL/FIBER PO LIQD
237.0000 mL | Freq: Every day | ORAL | Status: DC
  Administered 2016-02-15 – 2016-02-17 (×12): 237 mL

## 2016-02-15 MED ORDER — OXYCODONE HCL 5 MG/5ML PO SOLN
5.0000 mg | ORAL | Status: DC | PRN
  Administered 2016-02-15 – 2016-02-17 (×8): 5 mg
  Filled 2016-02-15 (×8): qty 5

## 2016-02-15 MED ORDER — CHLORHEXIDINE GLUCONATE 0.12 % MT SOLN
15.0000 mL | Freq: Two times a day (BID) | OROMUCOSAL | Status: DC
  Administered 2016-02-15 – 2016-02-17 (×5): 15 mL via OROMUCOSAL
  Filled 2016-02-15 (×5): qty 15

## 2016-02-15 MED ORDER — METHYLPREDNISOLONE SODIUM SUCC 40 MG IJ SOLR
40.0000 mg | Freq: Every day | INTRAMUSCULAR | Status: DC
  Administered 2016-02-15 – 2016-02-17 (×3): 40 mg via INTRAVENOUS
  Filled 2016-02-15 (×3): qty 1

## 2016-02-15 MED ORDER — DOCUSATE SODIUM 50 MG/5ML PO LIQD
100.0000 mg | Freq: Two times a day (BID) | ORAL | Status: DC
  Administered 2016-02-15 (×2): 100 mg
  Filled 2016-02-15 (×6): qty 10

## 2016-02-15 MED ORDER — PIPERACILLIN-TAZOBACTAM 3.375 G IVPB
3.3750 g | Freq: Three times a day (TID) | INTRAVENOUS | Status: DC
  Administered 2016-02-15 – 2016-02-17 (×6): 3.375 g via INTRAVENOUS
  Filled 2016-02-15 (×6): qty 50

## 2016-02-15 MED ORDER — ORAL CARE MOUTH RINSE
15.0000 mL | Freq: Two times a day (BID) | OROMUCOSAL | Status: DC
  Administered 2016-02-15 – 2016-02-17 (×4): 15 mL via OROMUCOSAL

## 2016-02-15 NOTE — Progress Notes (Signed)
Chaplain was making his rounds and visited with pt in room 113. Provided a pastoral presence and emotional support.    02/15/16 1220  Clinical Encounter Type  Visited With Patient  Visit Type Initial;Spiritual support  Referral From Nurse  Spiritual Encounters  Spiritual Needs Emotional

## 2016-02-15 NOTE — Progress Notes (Signed)
Patient ID: Jeffrey [name], male   DOB: [birth year], 65 y.o.   MRN: [id number]  [employer] PROGRESS NOTE  Jeffrey [name] [id number] DOB: [birth year] DOA: 02/14/2016 PCP: Tama High III, MD  HPI/Subjective: Patient with chest pain shortness of breath and cough. Bringing up yellow phlegm. Still not feeling well.  Objective: Vitals:   02/15/16 0958 02/15/16 1317  BP: 101/61 103/66  Pulse: 86 86  Resp:  15  Temp:  98.1 F (36.7 C)    Filed Weights   02/14/16 1600 02/14/16 2019  Weight: 49.4 kg (109 lb) 46.9 kg (103 lb 8 oz)    ROS: Review of Systems  Constitutional: Negative for chills and fever.  Eyes: Negative for blurred vision.  Respiratory: Positive for cough and shortness of breath.   Cardiovascular: Positive for chest pain.  Gastrointestinal: Negative for abdominal pain, constipation, diarrhea, nausea and vomiting.  Genitourinary: Negative for dysuria.  Musculoskeletal: Negative for joint pain.  Neurological: Negative for dizziness and headaches.   Exam: Physical Exam  Constitutional: He is oriented to person, place, and time. He appears cachectic.  HENT:  Nose: No mucosal edema.  Mouth/Throat: No oropharyngeal exudate or posterior oropharyngeal edema.  Eyes: Conjunctivae, EOM and lids are normal. Pupils are equal, round, and reactive to light.  Neck: No JVD present. Carotid bruit is not present. No edema present. No thyroid mass and no thyromegaly present.  Cardiovascular: S1 normal and S2 normal.  Exam reveals no gallop.   No murmur heard. Pulses:      Dorsalis pedis pulses are 2+ on the right side, and 2+ on the left side.  Respiratory: No respiratory distress. He has no wheezes. He has rhonchi in the right lower field and the left lower field. He has no rales.  GI: Soft. Bowel sounds are normal. There is no tenderness.  Musculoskeletal:       Right ankle: He exhibits no swelling.       Left ankle: He exhibits no swelling.  Lymphadenopathy:    He has  no cervical adenopathy.  Neurological: He is alert and oriented to person, place, and time. No cranial nerve deficit.  Skin: Skin is warm. No rash noted. Nails show no clubbing.  Psychiatric: He has a normal mood and affect.      Data Reviewed: Basic Metabolic Panel:  Recent Labs Lab 02/14/16 1603 02/15/16 0523  NA 142 143  K 4.0 3.6  CL 93* 102  CO2 36* 34*  GLUCOSE 106* 103*  BUN 96* 85*  CREATININE 1.88* 1.65*  CALCIUM 9.5 8.6*   Liver Function Tests: No results for input(s): AST, ALT, ALKPHOS, BILITOT, PROT, ALBUMIN in the last 168 hours. No results for input(s): LIPASE, AMYLASE in the last 168 hours. No results for input(s): AMMONIA in the last 168 hours. CBC:  Recent Labs Lab 02/14/16 1603 02/15/16 0523  WBC 5.3 4.8  HGB 10.8* 8.9*  HCT 32.4* 27.4*  MCV 87.7 88.8  PLT 149* 118*   Cardiac Enzymes:  Recent Labs Lab 02/14/16 1603  TROPONINI <0.03    CBG:  Recent Labs Lab 02/15/16 0724  GLUCAP 169*    Recent Results (from the past 240 hour(s))  Blood culture (routine x 2)     Status: None (Preliminary result)   Collection Time: 02/14/16  6:07 PM  Result Value Ref Range Status   Specimen Description BLOOD PORTA CATH  Final   Special Requests BOTTLES DRAWN AEROBIC AND ANAEROBIC  5CC  Final   Culture NO  GROWTH < 24 HOURS  Final   Report Status PENDING  Incomplete     Studies: Dg Chest 2 View  Result Date: 02/14/2016 CLINICAL DATA:  Pt states he had PNA several months ago. Cough since but worse last 1-2 wks. Hx - HTN, emphysema, esophogeal cancer, bladder cancer, CKD, port-a-cath 02/2015. Current smoker 0.5 ppd EXAM: CHEST  2 VIEW COMPARISON:  PET-CT 01/22/2016, radiograph 01/07/2016 FINDINGS: RIGHT port noted. Stable cardiac silhouette. There is new bibasilar faint airspace opacities. Upper lobe thickening and scarring on the RIGHT is unchanged. IMPRESSION: New bibasilar hazy airspace opacities concerning for pneumonia versus neoplastic  progression. Stable linear its scarring and thickening in the RIGHT upper lobe. Electronically Signed   By: Suzy Bouchard M.D.   On: 02/14/2016 16:40    Scheduled Meds: . azithromycin  500 mg Intravenous Q24H  . chlorhexidine  15 mL Mouth Rinse BID  . chlorpheniramine-HYDROcodone  5 mL Oral Q12H  . docusate  100 mg Per Tube BID  . enoxaparin (LOVENOX) injection  30 mg Subcutaneous Q24H  . feeding supplement (JEVITY 1.5 CAL/FIBER)  237 mL Per Tube 6 X Daily  . mouth rinse  15 mL Mouth Rinse q12n4p  . methylPREDNISolone (SOLU-MEDROL) injection  40 mg Intravenous Daily  . piperacillin-tazobactam (ZOSYN)  IV  3.375 g Intravenous Q8H   Continuous Infusions: . sodium chloride 100 mL/hr at 02/15/16 0905    Assessment/Plan:  1. Aspiration pneumonia, hypotension. Change antibiotics to Zosyn and continue the Zithromax for now. Watch in the hospital at least another night. Likely switch to Augmentin upon discharge home. Decrease rate of IV fluids. 2. History of esophageal cancer with recent chemotherapy 3. Chronic kidney disease stage III watch with IV fluid hydration 4. Anemia of chronic disease watch with IV fluid hydration 5. Weakness physical therapy evaluation 6. Chronic pain continue oxycodone decrease frequency  Code Status:     Code Status Orders        Start     Ordered   02/14/16 1728  Full code  Continuous     02/14/16 1730    Code Status History    Date Active Date Inactive Code Status Order ID Comments User Context   11/23/2015  5:21 PM 11/30/2015  6:28 PM Full Code [id number]  Lytle Butte, MD ED   11/11/2015  4:27 PM 11/12/2015  3:15 AM Full Code [id number]  Sabino Dick, MD [place]   05/20/2015  5:06 PM 05/23/2015  2:02 PM Full Code [id number]  Gladstone Lighter, MD Inpatient   01/20/2015  2:35 PM 01/24/2015  5:16 PM Full Code [id number]  Fritzi Mandes, MD Inpatient     Family Communication: Wife at the bedside Disposition Plan: Potentially home  tomorrow  Antibiotics:  Zosyn  Zithromax  Time spent: 25 minutes  Loletha Grayer  [employer]

## 2016-02-15 NOTE — Progress Notes (Signed)
Initial Nutrition Assessment  DOCUMENTATION CODES:   Severe malnutrition in context of chronic illness, Underweight  INTERVENTION:  Will update order to reflect home regimen. Provide Jevity 1.5, 6 cans daily with water flush of 60 ml before and after each feeding. This regimen provides 2133 kcal, 91 grams protein, 1800 ml H2O.  Encouraged patient to provide 6 cans daily of Jevity 1.5 even if he is feeling tired, as providing less will lead to further weight loss and loss of lean body mass. Patient amenable to providing 6 cans daily again.  Will continue to monitor tolerance of TF regimen and labs to see if any changes are needed.  NUTRITION DIAGNOSIS:   Increased nutrient needs related to catabolic illness, cancer and cancer related treatments as evidenced by estimated needs.  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   Spoke with patient at bedside. He reports he has been feeling very fatigued lately. Reports home regimen of Jevity 1.5, 4-5 cans daily with free water flush of 60 ml before and after each feed. When RD asked patient why he had decreased from 6 cans daily to 4-5, he initially reported he had never been at 6 cans daily. He then stated he is too tired. Encouraged him to provide 6 cans to meet calorie and protein needs and prevent further weight loss. Denies N/V. Still not taking anything by mouth.  Per chart UBW was 160 lbs in late 2016. He has lost 57 lbs (36% body weight) over 1 year, which is significant for time frame. Most recent weight loss is 10 lbs (9% body weight) over 1.5 months, which is significant  for time frame.  Medications reviewed and include: Colace, NS @ 100 ml/hr.  Labs reviewed: CBG 169, CO2 34, BUN 85, Creatinine 1.65.   Nutrition-Focused physical exam completed. Findings are severe fat depletion, severe muscle depletion, and no edema.   Patient continues to meet criteria for severe chronic malnutrition in setting of 36% weight loss in 1 year, severe fat depletion, severe muscle depletion.  Spoke with RN about updating orders to home regimen to meet needs.   Diet Order:  Diet NPO time specified  Skin:  Reviewed, no issues  Last BM:  02/14/2016  Height:   Ht Readings from Last 1 Encounters:  02/14/16 5\' 8"  (1.727 m)    Weight:   Wt Readings from Last 1 Encounters:  02/14/16 103 lb 8 oz (46.9 kg)    Ideal Body Weight:  70 kg  BMI:  Body mass index is 15.74 kg/m.  Estimated Nutritional Needs:   Kcal:  1645-1875 (30-35 kcal/kg)  Protein:  70-94 grams (1.5-2 grams/kg)  Fluid:  >/= 1.7 L/day  EDUCATION NEEDS:   No education needs identified at this time  Willey Blade, MS, RD, LDN Pager: [phone] After Hours Pager: [phone]

## 2016-02-15 NOTE — Progress Notes (Signed)
Pharmacy Antibiotic Note  Jeffrey [name] is a 65 y.o. male admitted on 02/14/2016 with aspiration pneumonia.  Pharmacy has been consulted for piperacillin/tazobactam dosing.  Plan: Piperacillin/tazobactam 3.375 g IV q8h EI  Height: 5\' 8"  (172.7 cm) Weight: 103 lb 8 oz (46.9 kg) IBW/kg (Calculated) : 68.4  Temp (24hrs), Avg:98.2 F (36.8 C), Min:97.6 F (36.4 C), Max:98.7 F (37.1 C)   Recent Labs Lab 02/14/16 1603 02/14/16 1806 02/15/16 0523  WBC 5.3  --  4.8  CREATININE 1.88*  --  1.65*  LATICACIDVEN  --  1.9  --     Estimated Creatinine Clearance: 30.4 mL/min (by C-G formula based on SCr of 1.65 mg/dL (H)).    Allergies  Allergen Reactions  . Aspirin Nausea Only  . Taxol [Paclitaxel] Other (See Comments)    Diaphoretic, flushing and tightness in chest    Antimicrobials this admission: CTX 11/12 >> 11/13 Piperacillin/tazobactam 11/13 >>  Azithromycin 11/12 >>  Dose adjustments this admission:  Microbiology results: 11/12 BCx: No growth < 24 hours  Thank you for allowing pharmacy to be a part of this patient's care.  Lenis Noon, PharmD 02/15/2016 2:33 PM

## 2016-02-15 NOTE — Care Management (Addendum)
Admitted to [facility] with the diagnosis of pneumonia/escphageal cancer. Lives with wife, Hassan Rowan [phone]). States he hasn't seen Dr. Caryl Comes in awhile. No Home health. No skilled facility. No home oxygen. Takes he uses no aids for ambulation. Takes care of all basic activities of daily living himself. No falls. Fair appetite. Prescriptions are filled at [facility] on [address]. Shelbie Ammons RN MSN CCM Care Management [phone]

## 2016-02-15 NOTE — Care Management Obs Status (Signed)
[facility] NOTIFICATION   Patient Details  Name: Jeffrey [name] MRN: [id number] Date of Birth: 03/05/1951   Medicare Observation Status Notification Given:  Yes    Shelbie Ammons, RN 02/15/2016, 1:21 PM

## 2016-02-16 LAB — CREATININE, SERUM
Creatinine, Ser: 1.49 mg/dL — ABNORMAL HIGH (ref 0.61–1.24)
GFR calc Af Amer: 56 mL/min — ABNORMAL LOW (ref >60)
GFR calc non Af Amer: 48 mL/min — ABNORMAL LOW (ref >60)

## 2016-02-16 LAB — GLUCOSE, CAPILLARY: Glucose-Capillary: 169 mg/dL — ABNORMAL HIGH (ref 65–99)

## 2016-02-16 MED ORDER — AZITHROMYCIN 500 MG PO TABS: 500.0000 mg | ORAL_TABLET | ORAL | Status: DC

## 2016-02-16 MED ORDER — HYDROCOD POLST-CPM POLST ER 10-8 MG/5ML PO SUER
5.0000 mL | Freq: Two times a day (BID) | ORAL | Status: DC
  Administered 2016-02-16 – 2016-02-17 (×2): 5 mL
  Filled 2016-02-16 (×2): qty 5

## 2016-02-16 MED ORDER — ENOXAPARIN SODIUM 40 MG/0.4ML ~~LOC~~ SOLN
40.0000 mg | SUBCUTANEOUS | Status: DC
  Filled 2016-02-16: qty 0.4

## 2016-02-16 MED ORDER — DEXTROSE 5 % IV SOLN
500.0000 mg | INTRAVENOUS | Status: DC
  Administered 2016-02-16: 18:00:00 500 mg via INTRAVENOUS
  Filled 2016-02-16 (×2): qty 500

## 2016-02-16 NOTE — Progress Notes (Signed)
PT Cancellation Note  Patient Details Name: WOLF SCHILL MRN: [id number] DOB: 03/05/1951   Cancelled Treatment:    Reason Eval/Treat Not Completed: Other (comment) (Consult received and chart reviewed.  Evaluation attempted.  Patient reports generally feeling bad; declines participation with evaluation or any OOB activity at this time.  Will re-attempt at later time this date as appropriate.)   Magie Ciampa H. Owens Shark, PT, DPT, NCS 02/16/16, 9:27 AM [phone]

## 2016-02-16 NOTE — Progress Notes (Signed)
Patient ID: Jeffrey [name], male   DOB: [birth year], 65 y.o.   MRN: [id number]  [facility] PROGRESS NOTE  Jeffrey [name] [id number] DOB: 1950/08/07 DOA: 02/14/2016 PCP: Adin Hector, MD  HPI/Subjective: Patient still not feeling well. Still has cough shortness of breath and wheezing. Coughing up yellow phlegm.  Objective: Vitals:   02/15/16 2017 02/16/16 0428  BP: 96/60 107/63  Pulse: 90 82  Resp:    Temp: 98.4 F (36.9 C) 98 F (36.7 C)    Filed Weights   02/14/16 1600 02/14/16 2019  Weight: 49.4 kg (109 lb) 46.9 kg (103 lb 8 oz)    ROS: Review of Systems  Constitutional: Negative for chills and fever.  Eyes: Negative for blurred vision.  Respiratory: Positive for cough, shortness of breath and wheezing.   Cardiovascular: Positive for chest pain.  Gastrointestinal: Negative for abdominal pain, constipation, diarrhea, nausea and vomiting.  Genitourinary: Negative for dysuria.  Musculoskeletal: Negative for joint pain.  Neurological: Negative for dizziness and headaches.   Exam: Physical Exam  Constitutional: He is oriented to person, place, and time. He appears cachectic.  HENT:  Nose: No mucosal edema.  Mouth/Throat: No oropharyngeal exudate or posterior oropharyngeal edema.  Eyes: Conjunctivae, EOM and lids are normal. Pupils are equal, round, and reactive to light.  Neck: No JVD present. Carotid bruit is not present. No edema present. No thyroid mass and no thyromegaly present.  Cardiovascular: S1 normal and S2 normal.  Exam reveals no gallop.   No murmur heard. Pulses:      Dorsalis pedis pulses are 2+ on the right side, and 2+ on the left side.  Respiratory: No respiratory distress. He has no wheezes. He has rhonchi in the right lower field and the left lower field. He has no rales.  GI: Soft. Bowel sounds are normal. There is no tenderness.  Musculoskeletal:       Right ankle: He exhibits no swelling.       Left ankle: He exhibits no swelling.   Lymphadenopathy:    He has no cervical adenopathy.  Neurological: He is alert and oriented to person, place, and time. No cranial nerve deficit.  Skin: Skin is warm. No rash noted. Nails show no clubbing.  Psychiatric: He has a normal mood and affect.      Data Reviewed: Basic Metabolic Panel:  Recent Labs Lab 02/14/16 1603 02/15/16 0523 02/16/16 0512  NA 142 143  --   K 4.0 3.6  --   CL 93* 102  --   CO2 36* 34*  --   GLUCOSE 106* 103*  --   BUN 96* 85*  --   CREATININE 1.88* 1.65* 1.49*  CALCIUM 9.5 8.6*  --    CBC:  Recent Labs Lab 02/14/16 1603 02/15/16 0523  WBC 5.3 4.8  HGB 10.8* 8.9*  HCT 32.4* 27.4*  MCV 87.7 88.8  PLT 149* 118*   Cardiac Enzymes:  Recent Labs Lab 02/14/16 1603  TROPONINI <0.03    CBG:  Recent Labs Lab 02/15/16 0724 02/16/16 0749  GLUCAP 169* 169*    Recent Results (from the past 240 hour(s))  Blood culture (routine x 2)     Status: None (Preliminary result)   Collection Time: 02/14/16  6:07 PM  Result Value Ref Range Status   Specimen Description BLOOD PORTA CATH  Final   Special Requests BOTTLES DRAWN AEROBIC AND ANAEROBIC  5CC  Final   Culture NO GROWTH 2 DAYS  Final   Report  Status PENDING  Incomplete     Studies: Dg Chest 2 View  Result Date: 02/14/2016 CLINICAL DATA:  Pt states he had PNA several months ago. Cough since but worse last 1-2 wks. Hx - HTN, emphysema, esophogeal cancer, bladder cancer, CKD, port-a-cath 02/2015. Current smoker 0.5 ppd EXAM: CHEST  2 VIEW COMPARISON:  PET-CT 01/22/2016, radiograph 01/07/2016 FINDINGS: RIGHT port noted. Stable cardiac silhouette. There is new bibasilar faint airspace opacities. Upper lobe thickening and scarring on the RIGHT is unchanged. IMPRESSION: New bibasilar hazy airspace opacities concerning for pneumonia versus neoplastic progression. Stable linear its scarring and thickening in the RIGHT upper lobe. Electronically Signed   By: Suzy Bouchard M.D.   On:  02/14/2016 16:40    Scheduled Meds: . azithromycin  500 mg Intravenous Q24H  . chlorhexidine  15 mL Mouth Rinse BID  . chlorpheniramine-HYDROcodone  5 mL Oral Q12H  . docusate  100 mg Per Tube BID  . enoxaparin (LOVENOX) injection  40 mg Subcutaneous Q24H  . feeding supplement (JEVITY 1.5 CAL/FIBER)  237 mL Per Tube 6 X Daily  . mouth rinse  15 mL Mouth Rinse q12n4p  . methylPREDNISolone (SOLU-MEDROL) injection  40 mg Intravenous Daily  . piperacillin-tazobactam (ZOSYN)  IV  3.375 g Intravenous Q8H   Continuous Infusions: . sodium chloride 40 mL/hr at 02/16/16 0705    Assessment/Plan:  1. Aspiration pneumonia, hypotension. Changed antibiotics to Zosyn and continue the Zithromax for now. Likely switch to Augmentin upon discharge home. I added Solu-Medrol for pleuritic chest pain. Patient still having chest pain today. Continue to monitor another day in the hospital. 2. History of esophageal cancer with recent chemotherapy 3. Chronic kidney disease stage III 4. Anemia of chronic disease. Stop IV fluids since hemoglobin trending down 5. Weakness physical therapy evaluation 6. Chronic pain continue oxycodone decrease frequency  Code Status:     Code Status Orders        Start     Ordered   02/14/16 1728  Full code  Continuous     02/14/16 1730    Code Status History    Date Active Date Inactive Code Status Order ID Comments User Context   11/23/2015  5:21 PM 11/30/2015  6:28 PM Full Code [id number]  Lytle Butte, MD ED   11/11/2015  4:27 PM 11/12/2015  3:15 AM Full Code [id number]  Sabino Dick, MD [place]   05/20/2015  5:06 PM 05/23/2015  2:02 PM Full Code [id number]  Gladstone Lighter, MD Inpatient   01/20/2015  2:35 PM 01/24/2015  5:16 PM Full Code [id number]  Fritzi Mandes, MD Inpatient     Family Communication: Wife Yesterday Disposition Plan: Potentially home tomorrow  Antibiotics:  Zosyn  Zithromax  Time spent: 24 minutes  Loletha Grayer  [employer]

## 2016-02-16 NOTE — Progress Notes (Addendum)
Lovenox dosing Will continue lovenox 30mg  daily. Pt crcl is >30, but pt weight is <57kg for a male.  Ramond Dial, Pharm.D Clinical Pharmacist

## 2016-02-16 NOTE — Evaluation (Signed)
Physical Therapy Evaluation Patient Details Name: Jeffrey [name] MRN: [id number] DOB: 1950/07/01 Today's Date: 02/16/2016   History of Present Illness  presented to ER secondary to SOB x2 days; admitted with bilat PNA.  Of note, patient with chronic PEG (x1 year)  Clinical Impression  Patient agreeable to session on second attempt this date.  Reports feeling generally fatigued, but willing to participate with evaluation.  Demonstrates ability to complete bed mobility, sit/stand, basic transfers and gait (220') without assist device, distant sup/mod indep level without balance loss or safety concern.  MAintains sats >94% on RA throughout session with minimal SOB noted.  Gait distance [facility] for age-matched norms (10' walk time, 6 seconds); indicative of minimal fall risk with functional activities. Appears at baseline level of functional ability without acute PT need identified.  Patient voiced agreement.  Did reinforce recommendations for continued mobility throughout unit (with staff assist) during remaining hospitalization to promote pulmonary hygiene and functional endurance; patient voiced understanding.  Will complete initial order at this time; please re-consult should needs change.     Follow Up Recommendations No PT follow up    Equipment Recommendations       Recommendations for Other Services       Precautions / Restrictions Precautions Precautions: Fall Precaution Comments: NPO/PEG, R chest port Restrictions Weight Bearing Restrictions: No      Mobility  Bed Mobility Overal bed mobility: Independent                Transfers Overall transfer level: Modified independent               General transfer comment: sit/stand without assist device; good stability  Ambulation/Gait Ambulation/Gait assistance: Supervision;Modified independent (Device/Increase time) Ambulation Distance (Feet): 220 Feet Assistive device: None   Gait velocity: 10' walk time, 6  seconds   General Gait Details: reciprocal stepping pattern with good step height/length, good trunk rotation and overall gait mechanics.  No balance losses or safety concern.  Maintains sats >95% on RA with minimal SOB noted.  Stairs            Wheelchair Mobility    Modified Rankin (Stroke Patients Only)       Balance Overall balance assessment: Modified Independent                                           Pertinent Vitals/Pain Pain Assessment: No/denies pain    Home Living Family/patient expects to be discharged to:: Private residence Living Arrangements: Spouse/significant other Available Help at Discharge: Family Type of Home: House Home Access: Stairs to enter   Technical brewer of Steps: 3 Home Layout: One level        Prior Function Level of Independence: Independent         Comments: Indep with ADLS, household and community mobility     Hand Dominance        Extremity/Trunk Assessment   Upper Extremity Assessment: Overall WFL for tasks assessed           Lower Extremity Assessment: Overall WFL for tasks assessed (grossly at least 4/5)         Communication   Communication: No difficulties  Cognition Arousal/Alertness: Awake/alert Behavior During Therapy: WFL for tasks assessed/performed Overall Cognitive Status: Within Functional Limits for tasks assessed  General Comments      Exercises     Assessment/Plan    PT Assessment Patent does not need any further PT services  PT Problem List            PT Treatment Interventions      PT Goals (Current goals can be found in the Care Plan section)  Acute Rehab PT Goals PT Goal Formulation: All assessment and education complete, DC therapy    Frequency     Barriers to discharge        Co-evaluation               End of Session   Activity Tolerance: Patient limited by fatigue Patient left: in bed;with bed  alarm set;with call bell/phone within reach      Functional Assessment Tool Used: clinical judgement Functional Limitation: Mobility: Walking and moving around Mobility: Walking and Moving Around Current Status [id number]): At least 1 percent but less than 20 percent impaired, limited or restricted Mobility: Walking and Moving Around Goal Status [phone]): At least 1 percent but less than 20 percent impaired, limited or restricted Mobility: Walking and Moving Around Discharge Status [phone]): At least 1 percent but less than 20 percent impaired, limited or restricted    Time: 1120-1131 PT Time Calculation (min) (ACUTE ONLY): 11 min   Charges:   PT Evaluation $PT Eval Low Complexity: 1 Procedure     PT G Codes:   PT G-Codes **NOT FOR INPATIENT CLASS** Functional Assessment Tool Used: clinical judgement Functional Limitation: Mobility: Walking and moving around Mobility: Walking and Moving Around Current Status [id number]): At least 1 percent but less than 20 percent impaired, limited or restricted Mobility: Walking and Moving Around Goal Status [phone]): At least 1 percent but less than 20 percent impaired, limited or restricted Mobility: Walking and Moving Around Discharge Status [phone]): At least 1 percent but less than 20 percent impaired, limited or restricted   [name] H. Owens Shark, PT, DPT, NCS 02/16/16, 9:42 PM [phone]

## 2016-02-16 NOTE — Progress Notes (Signed)
While rounding, [place] made initial visit to room 113. Pt presented as uncomfortable and asked if I would come back another time. I agreed. CH offered silent prayer at the door. CH is available for follow up as needed.    02/16/16 1300  Clinical Encounter Type  Visited With Patient  Visit Type Initial;Spiritual support  Referral From Nurse

## 2016-02-17 LAB — EXPECTORATED SPUTUM ASSESSMENT W REFEX TO RESP CULTURE

## 2016-02-17 LAB — GLUCOSE, CAPILLARY: Glucose-Capillary: 119 mg/dL — ABNORMAL HIGH (ref 65–99)

## 2016-02-17 LAB — EXPECTORATED SPUTUM ASSESSMENT W GRAM STAIN, RFLX TO RESP C

## 2016-02-17 MED ORDER — JEVITY 1.5 CAL/FIBER PO LIQD: 237.0000 mL | Freq: Every day | ORAL | 0 refills | Status: AC

## 2016-02-17 MED ORDER — PREDNISONE 5 MG PO TABS: ORAL_TABLET | ORAL | 0 refills | Status: AC

## 2016-02-17 MED ORDER — HYDROCOD POLST-CPM POLST ER 10-8 MG/5ML PO SUER: 5.0000 mL | Freq: Two times a day (BID) | ORAL | 0 refills | Status: DC

## 2016-02-17 MED ORDER — HEPARIN SOD (PORK) LOCK FLUSH 100 UNIT/ML IV SOLN
500.0000 [IU] | Freq: Once | INTRAVENOUS | Status: AC
  Administered 2016-02-17: 500 [IU] via INTRAVENOUS
  Filled 2016-02-17: qty 5

## 2016-02-17 MED ORDER — ENOXAPARIN SODIUM 30 MG/0.3ML ~~LOC~~ SOLN: 30.0000 mg | SUBCUTANEOUS | Status: DC

## 2016-02-17 MED ORDER — OXYCODONE HCL 5 MG/5ML PO SOLN: 5.0000 mg | Freq: Four times a day (QID) | ORAL | 0 refills | Status: DC | PRN

## 2016-02-17 MED ORDER — AMOXICILLIN-POT CLAVULANATE 600-42.9 MG/5ML PO SUSR: 875.0000 mg | Freq: Two times a day (BID) | ORAL | 0 refills | Status: DC

## 2016-02-17 MED ORDER — AMOXICILLIN-POT CLAVULANATE 400-57 MG/5ML PO SUSR
872.0000 mg | Freq: Two times a day (BID) | ORAL | Status: DC
  Filled 2016-02-17: qty 10.9

## 2016-02-17 NOTE — Discharge Instructions (Signed)
Flush PEG with 150 ml free water six times a day  Aspiration Pneumonia Aspiration pneumonia is an infection in your lungs. It occurs when food, liquid, or stomach contents (vomit) are inhaled (aspirated) into your lungs. When these things get into your lungs, swelling (inflammation) and infection can occur. This can make it difficult for you to breathe. Aspiration pneumonia is a serious condition and can be life threatening. What increases the risk? Aspiration pneumonia is more likely to occur when a person's cough (gag) reflex or ability to swallow has been decreased. Some things that can do this include:  Having a brain injury or disease, such as stroke, seizures, Parkinson's disease, dementia, or amyotrophic lateral sclerosis (ALS).  Being given general anesthetic for procedures.  Being in a coma (unconscious).  Having a narrowing of the tube that carries food to the stomach (esophagus).  Drinking too much alcohol. If a person passes out and vomits, vomit can be swallowed into the lungs.  Taking certain medicines, such as tranquilizers or sedatives. What are the signs or symptoms?  Coughing after swallowing food or liquids.  Breathing problems, such as wheezing or shortness of breath.  Bluish skin. This can be caused by lack of oxygen.  Coughing up food or mucus. The mucus might contain blood, greenish material, or yellowish-white fluid (pus).  Fever.  Chest pain.  Being more tired than usual (fatigue).  Sweating more than usual.  Bad breath. How is this diagnosed? A physical exam will be done. During the exam, the health care provider will listen to your lungs with a stethoscope to check for:  Crackling sounds in the lungs.  Decreased breath sounds.  A rapid heartbeat. Various tests may be ordered. These may include:  Chest X-ray.  CT scan.  Swallowing study. This test looks at how food is swallowed and whether it goes into your breathing tube (trachea) or  food pipe (esophagus).  Sputum culture. Saliva and mucus (sputum) are collected from the lungs or the tubes that carry air to the lungs (bronchi). The sputum is then tested for bacteria.  Bronchoscopy. This test uses a flexible tube (bronchoscope) to see inside the lungs. How is this treated? Treatment will usually include antibiotic medicines. Other medicines may also be used to reduce fever or pain. You may need to be treated in the hospital. In the hospital, your breathing will be carefully monitored. Depending on how well you are breathing, you may need to be given oxygen, or you may need breathing support from a breathing machine (ventilator). For people who fail a swallowing study, a feeding tube might be placed in the stomach, or they may be asked to avoid certain food textures or liquids when they eat. Follow these instructions at home:  Carefully follow any special eating instructions you were given, such as avoiding certain food textures or thickening liquids. This reduces the risk of developing aspiration pneumonia again.  Only take over-the-counter or prescription medicines as directed by your health care provider. Follow the directions carefully.  If you were prescribed antibiotics, take them as directed. Finish them even if you start to feel better.  Rest as instructed by your health care provider.  Keep all follow-up appointments with your health care provider. Contact a health care provider if:  You develop worsening shortness of breath, wheezing, or difficulty breathing.  You develop a fever.  You have chest pain. This information is not intended to replace advice given to you by your health care provider. Make sure you  discuss any questions you have with your health care provider. Document Released: 01/16/2009 Document Revised: 09/02/2015 Document Reviewed: 09/06/2012 Elsevier Interactive Patient Education  2017 [employer].

## 2016-02-17 NOTE — Discharge Summary (Signed)
[facility] [place] NAME: Jeffrey [name]    MR#:  [id number]  DATE OF BIRTH:  09-07-1950  DATE OF ADMISSION:  02/14/2016 ADMITTING PHYSICIAN: Epifanio Lesches, MD  DATE OF DISCHARGE: 02/17/2016 12:38 PM  PRIMARY CARE PHYSICIAN: Tama High III, MD    ADMISSION DIAGNOSIS:  Community acquired pneumonia, unspecified laterality [J18.9]  DISCHARGE DIAGNOSIS:  Active Problems:   Cough   SECONDARY DIAGNOSIS:   Past Medical History:  Diagnosis Date  . Bladder cancer [facility])    s/p surgery and reconstruction  . Chronic kidney disease    stage 3  . Emphysema of lung ([place])   . Esophageal cancer ([place])   . Hypertension     HOSPITAL COURSE:   1. Aspiration pneumonia and relative hypotension. The patient was initially on Rocephin and Zithromax I switched it over to Zosyn and continue the Cipro max. The patient finished Zithromax during the hospital course. I prescribed Augmentin liquid upon discharge home. 2. Pleuritic chest pain. I added Solu-Medrol once a day dosing and had oral steroids upon discharge home 3. History of esophageal cancer with recent chemotherapy 4. Chronic kidney disease stage III 5. Anemia of chronic disease 6. Weakness. Did well with physical therapy 7. Chronic pain on oxycodone  DISCHARGE CONDITIONS:   Satisfactory  CONSULTS OBTAINED:   none  DRUG ALLERGIES:   Allergies  Allergen Reactions  . Aspirin Nausea Only  . Taxol [Paclitaxel] Other (See Comments)    Diaphoretic, flushing and tightness in chest    DISCHARGE MEDICATIONS:   Discharge Medication List as of 02/17/2016  9:59 AM    START taking these medications   Details  amoxicillin-clavulanate (AUGMENTIN) 600-42.9 MG/5ML suspension Place 7.3 mLs (875 mg total) into feeding tube 2 (two) times daily., Starting Wed 02/17/2016, Print    chlorpheniramine-HYDROcodone (TUSSIONEX) 10-8 MG/5ML SUER Place 5 mLs into feeding tube every 12 (twelve) hours.,  Starting Wed 02/17/2016, Print    Nutritional Supplements (FEEDING SUPPLEMENT, JEVITY 1.5 CAL/FIBER,) LIQD Place 237 mLs into feeding tube 6 (six) times daily., Starting Wed 02/17/2016, Print    predniSONE (DELTASONE) 5 MG tablet 4 tabs peg tube day1; 3 tabs day2; 2 tabs day 3; 1 tab day4, Print      CONTINUE these medications which have CHANGED   Details  oxyCODONE (ROXICODONE) 5 MG/5ML solution Place 5 mLs (5 mg total) into feeding tube every 6 (six) hours as needed for severe pain., Starting Wed 02/17/2016, Print      CONTINUE these medications which have NOT CHANGED   Details  zolpidem (AMBIEN) 5 MG tablet Take 1 tablet (5 mg total) by mouth at bedtime as needed for sleep., Starting Wed 12/23/2015, Print         DISCHARGE INSTRUCTIONS:   Follow-up PMD one week  If you experience worsening of your admission symptoms, develop shortness of breath, life threatening emergency, suicidal or homicidal thoughts you must seek medical attention immediately by calling 911 or calling your MD immediately  if symptoms less severe.  You Must read complete instructions/literature along with all the possible adverse reactions/side effects for all the Medicines you take and that have been prescribed to you. Take any new Medicines after you have completely understood and accept all the possible adverse reactions/side effects.   Please note  You were cared for by a hospitalist during your hospital stay. If you have any questions about your discharge medications or the care you received while you were in the hospital  after you are discharged, you can call the unit and asked to speak with the hospitalist on call if the hospitalist that took care of you is not available. Once you are discharged, your primary care physician will handle any further medical issues. Please note that NO REFILLS for any discharge medications will be authorized once you are discharged, as it is imperative that you return to your  primary care physician (or establish a relationship with a primary care physician if you do not have one) for your aftercare needs so that they can reassess your need for medications and monitor your lab values.    Today   CHIEF COMPLAINT:   Chief Complaint  Patient presents with  . Cough  . Shortness of Breath    HISTORY OF PRESENT ILLNESS:  Jeffrey [name]  is a 65 y.o. male with a known history of Esophageal cancer comes in with cough and shortness of breath found to have pneumonia bilaterally   VITAL SIGNS:  Blood pressure 98/65, pulse 95, temperature 98.6 F (37 C), temperature source Oral, resp. rate 18, height 5\' 8"  (1.727 m), weight 45.9 kg (101 lb 3.2 oz), SpO2 100 %.   PHYSICAL EXAMINATION:  GENERAL:  65 y.o.-year-old patient lying in the bed with no acute distress.  EYES: Pupils equal, round, reactive to light and accommodation. No scleral icterus. Extraocular muscles intact.  HEENT: Head atraumatic, normocephalic. Oropharynx and nasopharynx clear.  NECK:  Supple, no jugular venous distention. No thyroid enlargement, no tenderness.  LUNGS: Normal breath sounds bilaterally, no wheezing, rales,rhonchi or crepitation. No use of accessory muscles of respiration.  CARDIOVASCULAR: S1, S2 normal. No murmurs, rubs, or gallops.  ABDOMEN: Soft, non-tender, non-distended. Bowel sounds present. No organomegaly or mass.  EXTREMITIES: No pedal edema, cyanosis, or clubbing.  NEUROLOGIC: Cranial nerves II through XII are intact. Muscle strength 5/5 in all extremities. Sensation intact. Gait not checked.  PSYCHIATRIC: The patient is alert and oriented x 3.  SKIN: No obvious rash, lesion, or ulcer.   DATA REVIEW:   CBC  Recent Labs Lab 02/15/16 0523  WBC 4.8  HGB 8.9*  HCT 27.4*  PLT 118*    Chemistries   Recent Labs Lab 02/15/16 0523 02/16/16 0512  NA 143  --   K 3.6  --   CL 102  --   CO2 34*  --   GLUCOSE 103*  --   BUN 85*  --   CREATININE 1.65* 1.49*  CALCIUM  8.6*  --     Cardiac Enzymes  Recent Labs Lab 02/14/16 1603  TROPONINI <0.03    Microbiology Results  Results for orders placed or performed during the hospital encounter of 02/14/16  Blood culture (routine x 2)     Status: None (Preliminary result)   Collection Time: 02/14/16  6:07 PM  Result Value Ref Range Status   Specimen Description BLOOD PORTA CATH  Final   Special Requests BOTTLES DRAWN AEROBIC AND ANAEROBIC  5CC  Final   Culture NO GROWTH 3 DAYS  Final   Report Status PENDING  Incomplete  Culture, expectorated sputum-assessment     Status: None   Collection Time: 02/16/16  7:30 PM  Result Value Ref Range Status   Specimen Description SPU  Final   Special Requests NONE  Final   Sputum evaluation THIS SPECIMEN IS ACCEPTABLE FOR SPUTUM CULTURE  Final   Report Status 02/17/2016 FINAL  Final  Culture, respiratory (NON-Expectorated)     Status: None (Preliminary result)   Collection  Time: 02/16/16  7:30 PM  Result Value Ref Range Status   Specimen Description SPU  Final   Special Requests NONE Reflexed from [id number]  Final   Gram Stain   Final    ABUNDANT WBC PRESENT, PREDOMINANTLY PMN MODERATE GRAM POSITIVE COCCI IN PAIRS AND CHAINS FEW GRAM POSITIVE COCCI IN CLUSTERS FEW GRAM POSITIVE RODS RARE GRAM NEGATIVE RODS FEW SQUAMOUS EPITHELIAL CELLS PRESENT Performed at [facility]    Culture PENDING  Incomplete   Report Status PENDING  Incomplete      Management plans discussed with the patient And she is in agreement.  CODE STATUS:     Code Status Orders        Start     Ordered   02/14/16 1728  Full code  Continuous     02/14/16 1730    Code Status History    Date Active Date Inactive Code Status Order ID Comments User Context   11/23/2015  5:21 PM 11/30/2015  6:28 PM Full Code [id number]  Lytle Butte, MD ED   11/11/2015  4:27 PM 11/12/2015  3:15 AM Full Code [id number]  Sabino Dick, MD [place]   05/20/2015  5:06 PM 05/23/2015  2:02 PM Full Code [id number]   Gladstone Lighter, MD Inpatient   01/20/2015  2:35 PM 01/24/2015  5:16 PM Full Code [id number]  Fritzi Mandes, MD Inpatient      TOTAL TIME TAKING CARE OF THIS PATIENT: 35 minutes.    Loletha Grayer M.D on 02/17/2016 at 4:24 PM  Between 7am to 6pm - Pager - [phone]  After 6pm go to [email or web address] - password [email or web address]  [facility] Office  [phone]  CC: Primary care physician; Adin Hector, MD

## 2016-02-19 LAB — CULTURE, RESPIRATORY W GRAM STAIN

## 2016-02-19 LAB — CULTURE, BLOOD (ROUTINE X 2): Culture: NO GROWTH

## 2016-02-19 LAB — CULTURE, RESPIRATORY: CULTURE: NORMAL

## 2016-02-22 ENCOUNTER — Inpatient Hospital Stay: Payer: Self-pay

## 2016-02-22 ENCOUNTER — Inpatient Hospital Stay (HOSPITAL_BASED_OUTPATIENT_CLINIC_OR_DEPARTMENT_OTHER): Payer: Self-pay | Admitting: Internal Medicine

## 2016-02-22 VITALS — BP 99/73 | HR 108 | Temp 97.6°F | Resp 18 | Wt 111.8 lb

## 2016-02-22 DIAGNOSIS — Z923 Personal history of irradiation: Secondary | ICD-10-CM

## 2016-02-22 DIAGNOSIS — F17210 Nicotine dependence, cigarettes, uncomplicated: Secondary | ICD-10-CM

## 2016-02-22 DIAGNOSIS — Z79899 Other long term (current) drug therapy: Secondary | ICD-10-CM

## 2016-02-22 DIAGNOSIS — C159 Malignant neoplasm of esophagus, unspecified: Secondary | ICD-10-CM

## 2016-02-22 DIAGNOSIS — C154 Malignant neoplasm of middle third of esophagus: Secondary | ICD-10-CM

## 2016-02-22 DIAGNOSIS — N183 Chronic kidney disease, stage 3 (moderate): Secondary | ICD-10-CM

## 2016-02-22 DIAGNOSIS — R079 Chest pain, unspecified: Secondary | ICD-10-CM

## 2016-02-22 DIAGNOSIS — E46 Unspecified protein-calorie malnutrition: Secondary | ICD-10-CM

## 2016-02-22 DIAGNOSIS — Z8551 Personal history of malignant neoplasm of bladder: Secondary | ICD-10-CM

## 2016-02-22 DIAGNOSIS — J439 Emphysema, unspecified: Secondary | ICD-10-CM

## 2016-02-22 DIAGNOSIS — C770 Secondary and unspecified malignant neoplasm of lymph nodes of head, face and neck: Secondary | ICD-10-CM

## 2016-02-22 DIAGNOSIS — Z803 Family history of malignant neoplasm of breast: Secondary | ICD-10-CM

## 2016-02-22 DIAGNOSIS — I129 Hypertensive chronic kidney disease with stage 1 through stage 4 chronic kidney disease, or unspecified chronic kidney disease: Secondary | ICD-10-CM

## 2016-02-22 DIAGNOSIS — J690 Pneumonitis due to inhalation of food and vomit: Secondary | ICD-10-CM

## 2016-02-22 DIAGNOSIS — C801 Malignant (primary) neoplasm, unspecified: Secondary | ICD-10-CM

## 2016-02-22 DIAGNOSIS — J90 Pleural effusion, not elsewhere classified: Secondary | ICD-10-CM

## 2016-02-22 DIAGNOSIS — R0789 Other chest pain: Secondary | ICD-10-CM

## 2016-02-22 DIAGNOSIS — Z931 Gastrostomy status: Secondary | ICD-10-CM

## 2016-02-22 LAB — CBC WITH DIFFERENTIAL/PLATELET
BASOS ABS: 0 10*3/uL (ref 0–0.1)
Basophils Relative: 0 %
EOS ABS: 0 10*3/uL (ref 0–0.7)
EOS PCT: 0 %
HCT: 26 % — ABNORMAL LOW (ref 40.0–52.0)
Hemoglobin: 8.5 g/dL — ABNORMAL LOW (ref 13.0–18.0)
LYMPHS PCT: 10 %
Lymphs Abs: 0.4 10*3/uL — ABNORMAL LOW (ref 1.0–3.6)
MCH: 29 pg (ref 26.0–34.0)
MCHC: 32.8 g/dL (ref 32.0–36.0)
MCV: 88.4 fL (ref 80.0–100.0)
MONO ABS: 0.7 10*3/uL (ref 0.2–1.0)
Monocytes Relative: 18 %
Neutro Abs: 2.6 10*3/uL (ref 1.4–6.5)
Neutrophils Relative %: 72 %
PLATELETS: 91 10*3/uL — AB (ref 150–440)
RBC: 2.94 MIL/uL — AB (ref 4.40–5.90)
RDW: 19.5 % — AB (ref 11.5–14.5)
WBC: 3.7 10*3/uL — AB (ref 3.8–10.6)

## 2016-02-22 LAB — COMPREHENSIVE METABOLIC PANEL
ALT: 11 U/L — AB (ref 17–63)
AST: 17 U/L (ref 15–41)
Albumin: 2.7 g/dL — ABNORMAL LOW (ref 3.5–5.0)
Alkaline Phosphatase: 51 U/L (ref 38–126)
Anion gap: 7 (ref 5–15)
BUN: 34 mg/dL — ABNORMAL HIGH (ref 6–20)
CALCIUM: 8.6 mg/dL — AB (ref 8.9–10.3)
CO2: 28 mmol/L (ref 22–32)
CREATININE: 1.45 mg/dL — AB (ref 0.61–1.24)
Chloride: 103 mmol/L (ref 101–111)
GFR, EST AFRICAN AMERICAN: 58 mL/min — AB (ref >60)
GFR, EST NON AFRICAN AMERICAN: 50 mL/min — AB (ref >60)
GLUCOSE: 142 mg/dL — AB (ref 65–99)
POTASSIUM: 4.1 mmol/L (ref 3.5–5.1)
SODIUM: 138 mmol/L (ref 135–145)
TOTAL PROTEIN: 7.5 g/dL (ref 6.5–8.1)
Total Bilirubin: 0.7 mg/dL (ref 0.3–1.2)

## 2016-02-22 MED ORDER — SODIUM CHLORIDE 0.9 % IV SOLN: 10.0000 mg | Freq: Once | INTRAVENOUS | Status: DC

## 2016-02-22 MED ORDER — HYDROCOD POLST-CPM POLST ER 10-8 MG/5ML PO SUER
5.0000 mL | Freq: Two times a day (BID) | ORAL | 0 refills | Status: DC
Start: 2016-02-22 — End: 2016-03-03

## 2016-02-22 MED ORDER — SODIUM CHLORIDE 0.9 % IV SOLN
2400.0000 mg/m2 | INTRAVENOUS | Status: DC
  Administered 2016-02-22: 3750 mg via INTRAVENOUS
  Filled 2016-02-22: qty 75

## 2016-02-22 MED ORDER — HEPARIN SOD (PORK) LOCK FLUSH 100 UNIT/ML IV SOLN
500.0000 [IU] | Freq: Once | INTRAVENOUS | Status: DC | PRN
  Filled 2016-02-22: qty 5

## 2016-02-22 MED ORDER — PALONOSETRON HCL INJECTION 0.25 MG/5ML
0.2500 mg | Freq: Once | INTRAVENOUS | Status: AC
  Administered 2016-02-22: 0.25 mg via INTRAVENOUS
  Filled 2016-02-22: qty 5

## 2016-02-22 MED ORDER — SODIUM CHLORIDE 0.9 % IJ SOLN
10.0000 mL | Freq: Once | INTRAMUSCULAR | Status: DC
  Filled 2016-02-22: qty 10

## 2016-02-22 MED ORDER — DEXTROSE 5 % IV SOLN
Freq: Once | INTRAVENOUS | Status: AC
  Administered 2016-02-22: 12:00:00 via INTRAVENOUS
  Filled 2016-02-22: qty 1000

## 2016-02-22 MED ORDER — LEUCOVORIN CALCIUM INJECTION 350 MG
600.0000 mg | Freq: Once | INTRAMUSCULAR | Status: AC
  Administered 2016-02-22: 600 mg via INTRAVENOUS
  Filled 2016-02-22: qty 10

## 2016-02-22 MED ORDER — HEPARIN SOD (PORK) LOCK FLUSH 100 UNIT/ML IV SOLN: 500.0000 [IU] | Freq: Once | INTRAVENOUS | Status: DC

## 2016-02-22 MED ORDER — OXALIPLATIN CHEMO INJECTION 100 MG/20ML
65.0000 mg/m2 | Freq: Once | INTRAVENOUS | Status: AC
  Administered 2016-02-22: 100 mg via INTRAVENOUS
  Filled 2016-02-22: qty 20

## 2016-02-22 MED ORDER — DEXAMETHASONE SODIUM PHOSPHATE 10 MG/ML IJ SOLN
10.0000 mg | Freq: Once | INTRAMUSCULAR | Status: AC
  Administered 2016-02-22: 10 mg via INTRAVENOUS
  Filled 2016-02-22: qty 1

## 2016-02-22 NOTE — Progress Notes (Signed)
Patient is here for follow up he is doing better.

## 2016-02-22 NOTE — Assessment & Plan Note (Addendum)
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 Will get CT scan after 4 cycles  # proceed with cycle #3. Cbc- okay; Hb 9/wbc/platelets- 90s. Patient and the wife understand that the treatments are palliative. I also understand that subsequently it's of therapy might not have a good response to chemotherapy.  # CKD creat 1.6--2.0; today 1.4. Monitor for now.   # RUL Pneumonia/aspiration- status post antibiotics; improved; discussed re: concerns of ongoing infections with chemotherapy.  # chest wall pain - continue oxycodone liquid. Stable.  # follow up in 2 weeks/chemo-FOLFOX.

## 2016-02-22 NOTE — Progress Notes (Signed)
#   proceed with cycle #3. Cbc- okay; Hb 9/wbc/platelets- 90s.. Per md note

## 2016-02-22 NOTE — Progress Notes (Signed)
.[facility] CONSULT NOTE  Patient Care Team: Bert J Klein III, MD as PCP - General (Internal Medicine) Kristi D Stanton, RN as Registered Nurse  Oncology History   # OCT 2016- SQUAMOUS CELL CA of middle esophagus; Stage IV [EGD bx]; PET- Esopahgeal uptake & 2 CM Left Cervical LN-MET SQUAM; Carbo-taxol [Nov 9th]with RT [Nov 14th];  Cont Weekly Carbo- RT; JAN 3rd- 2017-PET stable esophageal uptake/slight increase neck adenopathy- s/p local radiation to the neck with carboplatin-   # FEB 20th- START FOLFIRI s/p 5 cycles-May 2017- PET- Stable-esophageal uptake mediastinal LN/ Improved Left neck LN;   # June 26 PET- PROGRESSION; START ABRAXANE x3 cycles- OCT 2017 Progression Left neck  # OCT 23rd FOLFOX q2 W  # TAXOL HELD sec to ACUTE INFUSION REACTIONS  # malnutrition s/p PEG tube  # CKD [stage III]; Hx of non-invasive bladder ca [s/p cystectomy with neo-bladder; [facility]]  # Foundation One/MMR- STABLE; Oct 16th 2017- PDL-1 IHC-0%     Squamous cell esophageal cancer (HCC)   05/14/2015 Initial Diagnosis    Squamous cell esophageal cancer (HCC)       Cancer of middle third of esophagus (HCC)     HISTORY OF PRESENTING ILLNESS:  Jeffrey [name] 65 y.o. African-American male with recent diagnosis of stage IV esophageal cancer [oligo-Metastatic] squamous cell carcinoma currently on  FOLFOX every 2 week; s/p cycyle #2 is here for a follow up.  In the interim patient was admitted to the hospital for bilateral lower lobe pneumonia. Treated with antibiotics. No fevers. No chills. His breathing is improved. He continues to have cough needing to take cough medication. Chest pain with coughing. Needing to take the medication.  Patient did not notice any significant decrease in the size of his left neck adenopathy.  denies any pain in the neck.He continues to use his PEG tube ; weight stable.  He has been using 4 cans of tube feeds every day.  No significant diarrhea. No new tingling  or numbness.    ROS: A complete 10 point review of system is done which is negative except mentioned above in history of present illness.  MEDICAL HISTORY:  Past Medical History:  Diagnosis Date  . Bladder cancer (HCC)    s/p surgery and reconstruction  . Chronic kidney disease    stage 3  . Emphysema of lung (HCC)   . Esophageal cancer (HCC)   . Hypertension     SURGICAL HISTORY: Past Surgical History:  Procedure Laterality Date  . BLADDER REMOVAL    . ESOPHAGOGASTRODUODENOSCOPY N/A 05/22/2015   Procedure: ESOPHAGOGASTRODUODENOSCOPY (EGD);  Surgeon: Robert T Elliott, MD;  Location: [facility] ENDOSCOPY;  Service: Endoscopy;  Laterality: N/A;  . ESOPHAGOGASTRODUODENOSCOPY (EGD) WITH PROPOFOL N/A 01/21/2015   Procedure: ESOPHAGOGASTRODUODENOSCOPY (EGD) WITH PROPOFOL-looking in the esophagus stomach and upper small intestine to evaluate and treat;  Surgeon: Martin U Skulskie, MD;  Location: [facility] ENDOSCOPY;  Service: Endoscopy;  Laterality: N/A;  . GASTROSTOMY W/ FEEDING TUBE Left    placed end of october 2016  . LYMPH NODE BIOPSY N/A 02/04/2015   Procedure: LYMPH NODE BIOPSY;  Surgeon: Timothy Oaks, MD;  Location: [facility] ORS;  Service: General;  Laterality: N/A;  . PORTACATH PLACEMENT Right 02/04/2015   Procedure: INSERTION PORT-A-CATH;  Surgeon: Timothy Oaks, MD;  Location: [facility] ORS;  Service: General;  Laterality: Right;    SOCIAL HISTORY: Social History   Social History  . Marital status: Married    Spouse name: N/A  . Number   of children: N/A  . Years of education: N/A   Occupational History  . Not on file.   Social History Main Topics  . Smoking status: Former Smoker    Packs/day: 0.50    Years: 47.00    Types: Cigarettes    Quit date: 02/14/2015  . Smokeless tobacco: Former Systems developer    Types: Chew  . Alcohol use No     Comment: quit drinking 3 months ago  . Drug use: No  . Sexual activity: Not Currently   Other Topics Concern  . Not on file   Social History Narrative    Lives at home with wife. Independent at baseline.    FAMILY HISTORY: Family History  Problem Relation Age of Onset  . CAD Father   . Breast cancer Mother     ALLERGIES:  is allergic to aspirin and taxol [paclitaxel].  MEDICATIONS:  Current Outpatient Prescriptions  Medication Sig Dispense Refill  . amoxicillin-clavulanate (AUGMENTIN) 600-42.9 MG/5ML suspension Place 7.3 mLs (875 mg total) into feeding tube 2 (two) times daily. 75 mL 0  . chlorpheniramine-HYDROcodone (TUSSIONEX) 10-8 MG/5ML SUER Place 5 mLs into feeding tube every 12 (twelve) hours. 115 mL 0  . Nutritional Supplements (FEEDING SUPPLEMENT, JEVITY 1.5 CAL/FIBER,) LIQD Place 237 mLs into feeding tube 6 (six) times daily. 42660 mL 0  . oxyCODONE (ROXICODONE) 5 MG/5ML solution Place 5 mLs (5 mg total) into feeding tube every 6 (six) hours as needed for severe pain. 120 mL 0  . predniSONE (DELTASONE) 5 MG tablet 4 tabs peg tube day1; 3 tabs day2; 2 tabs day 3; 1 tab day4 10 tablet 0  . zolpidem (AMBIEN) 5 MG tablet Take 1 tablet (5 mg total) by mouth at bedtime as needed for sleep. (Patient not taking: Reported on 02/22/2016) 30 tablet 5   No current facility-administered medications for this visit.    Facility-Administered Medications Ordered in Other Visits  Medication Dose Route Frequency Provider Last Rate Last Dose  . fluorouracil (ADRUCIL) 3,750 mg in sodium chloride 0.9 % 75 mL chemo infusion  2,400 mg/m2 (Order-Specific) Intravenous 1 day or 1 dose Cammie Sickle, MD      . heparin lock flush 100 unit/mL  500 Units Intravenous Once Cammie Sickle, MD      . heparin lock flush 100 unit/mL  500 Units Intracatheter Once PRN Cammie Sickle, MD      . leucovorin 600 mg in dextrose 5 % 250 mL infusion  600 mg Intravenous Once Cammie Sickle, MD 140 mL/hr at 02/22/16 1217 600 mg at 02/22/16 1217  . oxaliplatin (ELOXATIN) 100 mg in dextrose 5 % 500 mL chemo infusion  65 mg/m2 (Order-Specific)  Intravenous Once Cammie Sickle, MD 260 mL/hr at 02/22/16 1216 100 mg at 02/22/16 1216  . sodium chloride 0.9 % injection 10 mL  10 mL Intracatheter PRN Cammie Sickle, MD   10 mL at 03/04/15 0933  . sodium chloride 0.9 % injection 10 mL  10 mL Intravenous Once Cammie Sickle, MD          .  PHYSICAL EXAMINATION: ECOG PERFORMANCE STATUS: 1 - Symptomatic but completely ambulatory  Vitals:   02/22/16 1008  BP: 99/73  Pulse: (!) 108  Resp: 18  Temp: 97.6 F (36.4 C)   Filed Weights   02/22/16 1008  Weight: 111 lb 12.8 oz (50.7 kg)    GENERAL: Thin built; moderately nourished. Alert, no distress he is alone. He is walking.  EYES:  no pallor or icterus OROPHARYNX: no thrush or ulceration; good dentition  NECK: supple, no masses felt LYMPH:  4--5cm  CM palpable lymphadenopathy in the cervical on left side  LUNGS: Decreased breath sounds bilaterally No wheeze or crackles HEART/CVS: regular rate & rhythm and no murmurs; No lower extremity edema ABDOMEN: abdomen soft, non-tender and normal bowel sounds; PEG tube in place Musculoskeletal:no cyanosis of digits and no clubbing  PSYCH: alert & oriented x 3 with fluent speech NEURO: no focal motor/sensory deficits SKIN:  no rashes or significant lesions  LABORATORY DATA:  I have reviewed the data as listed Lab Results  Component Value Date   WBC 3.7 (L) 02/22/2016   HGB 8.5 (L) 02/22/2016   HCT 26.0 (L) 02/22/2016   MCV 88.4 02/22/2016   PLT 91 (L) 02/22/2016    Recent Labs  02/01/16 1200 02/08/16 0857 02/14/16 1603 02/15/16 0523 02/16/16 0512 02/22/16 0946  NA 132* 133* 142 143  --  138  K 3.6 3.6 4.0 3.6  --  4.1  CL 85* 86* 93* 102  --  103  CO2 32 37* 36* 34*  --  28  GLUCOSE 187* 105* 106* 103*  --  142*  BUN 62* 70* 96* 85*  --  34*  CREATININE 1.84* 1.88* 1.88* 1.65* 1.49* 1.45*  CALCIUM 8.9 9.0 9.5 8.6*  --  8.6*  GFRNONAA 37* 36* 36* 43* 48* 50*  GFRAA 43* 42* 42* 49* 56* 58*  PROT 8.2*  8.2*  --   --   --  7.5  ALBUMIN 3.4* 3.2*  --   --   --  2.7*  AST 20 19  --   --   --  17  ALT 9* 8*  --   --   --  11*  ALKPHOS 52 57  --   --   --  51  BILITOT 0.5 0.2*  --   --   --  0.7     ASSESSMENT & PLAN:   Cancer of middle third of esophagus (HCC) # 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 Will get CT scan after 4 cycles  # proceed with cycle #3. Cbc- okay; Hb 9/wbc/platelets- 90s. Patient and the wife understand that the treatments are palliative. I also understand that subsequently it's of therapy might not have a good response to chemotherapy.  # CKD creat 1.6--2.0; today 1.4. Monitor for now.   # RUL Pneumonia/aspiration- status post antibiotics; improved; discussed re: concerns of ongoing infections with chemotherapy.  # chest wall pain - continue oxycodone liquid. Stable.  # follow up in 2 weeks/chemo-FOLFOX.     Cammie Sickle, MD 02/22/2016 1:44 PM

## 2016-02-24 ENCOUNTER — Telehealth: Payer: Self-pay | Admitting: *Deleted

## 2016-02-24 ENCOUNTER — Inpatient Hospital Stay: Payer: Self-pay

## 2016-02-24 VITALS — BP 95/65 | HR 103 | Temp 97.0°F | Resp 18

## 2016-02-24 DIAGNOSIS — C159 Malignant neoplasm of esophagus, unspecified: Secondary | ICD-10-CM

## 2016-02-24 DIAGNOSIS — C154 Malignant neoplasm of middle third of esophagus: Secondary | ICD-10-CM

## 2016-02-24 MED ORDER — HEPARIN SOD (PORK) LOCK FLUSH 100 UNIT/ML IV SOLN
INTRAVENOUS | Status: AC
  Filled 2016-02-24: qty 5

## 2016-02-24 MED ORDER — HEPARIN SOD (PORK) LOCK FLUSH 100 UNIT/ML IV SOLN
500.0000 [IU] | Freq: Once | INTRAVENOUS | Status: AC | PRN
  Administered 2016-02-24: 500 [IU]

## 2016-02-24 MED ORDER — OXYCODONE HCL 5 MG/5ML PO SOLN: 5.0000 mg | Freq: Four times a day (QID) | ORAL | 0 refills | Status: AC | PRN

## 2016-02-24 MED ORDER — SODIUM CHLORIDE 0.9% FLUSH
10.0000 mL | INTRAVENOUS | Status: DC | PRN
  Administered 2016-02-24: 10 mL
  Filled 2016-02-24: qty 10

## 2016-02-24 NOTE — Telephone Encounter (Signed)
Patient received rx

## 2016-03-03 ENCOUNTER — Telehealth: Payer: Self-pay | Admitting: *Deleted

## 2016-03-03 DIAGNOSIS — C154 Malignant neoplasm of middle third of esophagus: Secondary | ICD-10-CM

## 2016-03-03 MED ORDER — HYDROCOD POLST-CPM POLST ER 10-8 MG/5ML PO SUER: 5.0000 mL | Freq: Two times a day (BID) | ORAL | 0 refills | Status: AC

## 2016-03-03 NOTE — Telephone Encounter (Signed)
Called answering service said he has a question about his cough syrup. States he needs a refill.

## 2016-03-07 ENCOUNTER — Inpatient Hospital Stay: Payer: Self-pay

## 2016-03-07 ENCOUNTER — Inpatient Hospital Stay: Payer: Self-pay | Attending: Internal Medicine

## 2016-03-07 ENCOUNTER — Inpatient Hospital Stay (HOSPITAL_BASED_OUTPATIENT_CLINIC_OR_DEPARTMENT_OTHER): Payer: Self-pay | Admitting: Internal Medicine

## 2016-03-07 ENCOUNTER — Encounter: Payer: Self-pay | Admitting: Internal Medicine

## 2016-03-07 VITALS — BP 94/61 | HR 123 | Temp 96.8°F | Ht 67.0 in | Wt 106.4 lb

## 2016-03-07 DIAGNOSIS — Z79899 Other long term (current) drug therapy: Secondary | ICD-10-CM | POA: Insufficient documentation

## 2016-03-07 DIAGNOSIS — J439 Emphysema, unspecified: Secondary | ICD-10-CM | POA: Insufficient documentation

## 2016-03-07 DIAGNOSIS — Z931 Gastrostomy status: Secondary | ICD-10-CM

## 2016-03-07 DIAGNOSIS — Z803 Family history of malignant neoplasm of breast: Secondary | ICD-10-CM | POA: Insufficient documentation

## 2016-03-07 DIAGNOSIS — J690 Pneumonitis due to inhalation of food and vomit: Secondary | ICD-10-CM | POA: Insufficient documentation

## 2016-03-07 DIAGNOSIS — R0789 Other chest pain: Secondary | ICD-10-CM

## 2016-03-07 DIAGNOSIS — C154 Malignant neoplasm of middle third of esophagus: Secondary | ICD-10-CM

## 2016-03-07 DIAGNOSIS — Z923 Personal history of irradiation: Secondary | ICD-10-CM

## 2016-03-07 DIAGNOSIS — E46 Unspecified protein-calorie malnutrition: Secondary | ICD-10-CM | POA: Insufficient documentation

## 2016-03-07 DIAGNOSIS — I129 Hypertensive chronic kidney disease with stage 1 through stage 4 chronic kidney disease, or unspecified chronic kidney disease: Secondary | ICD-10-CM | POA: Insufficient documentation

## 2016-03-07 DIAGNOSIS — Z87891 Personal history of nicotine dependence: Secondary | ICD-10-CM

## 2016-03-07 DIAGNOSIS — N183 Chronic kidney disease, stage 3 (moderate): Secondary | ICD-10-CM

## 2016-03-07 DIAGNOSIS — C159 Malignant neoplasm of esophagus, unspecified: Secondary | ICD-10-CM

## 2016-03-07 DIAGNOSIS — Z5111 Encounter for antineoplastic chemotherapy: Secondary | ICD-10-CM | POA: Insufficient documentation

## 2016-03-07 DIAGNOSIS — C770 Secondary and unspecified malignant neoplasm of lymph nodes of head, face and neck: Secondary | ICD-10-CM | POA: Insufficient documentation

## 2016-03-07 DIAGNOSIS — Z5181 Encounter for therapeutic drug level monitoring: Secondary | ICD-10-CM | POA: Insufficient documentation

## 2016-03-07 DIAGNOSIS — Z8551 Personal history of malignant neoplasm of bladder: Secondary | ICD-10-CM | POA: Insufficient documentation

## 2016-03-07 LAB — COMPREHENSIVE METABOLIC PANEL
ALK PHOS: 60 U/L (ref 38–126)
ALT: 7 U/L — AB (ref 17–63)
ANION GAP: 11 (ref 5–15)
AST: 21 U/L (ref 15–41)
Albumin: 2.8 g/dL — ABNORMAL LOW (ref 3.5–5.0)
BUN: 36 mg/dL — ABNORMAL HIGH (ref 6–20)
CALCIUM: 8.9 mg/dL (ref 8.9–10.3)
CO2: 30 mmol/L (ref 22–32)
CREATININE: 1.71 mg/dL — AB (ref 0.61–1.24)
Chloride: 92 mmol/L — ABNORMAL LOW (ref 101–111)
GFR, EST AFRICAN AMERICAN: 47 mL/min — AB (ref >60)
GFR, EST NON AFRICAN AMERICAN: 41 mL/min — AB (ref >60)
Glucose, Bld: 138 mg/dL — ABNORMAL HIGH (ref 65–99)
Potassium: 3.8 mmol/L (ref 3.5–5.1)
SODIUM: 133 mmol/L — AB (ref 135–145)
Total Bilirubin: 0.4 mg/dL (ref 0.3–1.2)
Total Protein: 8 g/dL (ref 6.5–8.1)

## 2016-03-07 LAB — CBC WITH DIFFERENTIAL/PLATELET
Basophils Absolute: 0.1 10*3/uL (ref 0–0.1)
Basophils Relative: 1 %
EOS ABS: 0 10*3/uL (ref 0–0.7)
EOS PCT: 1 %
HCT: 25.7 % — ABNORMAL LOW (ref 40.0–52.0)
HEMOGLOBIN: 8.5 g/dL — AB (ref 13.0–18.0)
LYMPHS ABS: 1.3 10*3/uL (ref 1.0–3.6)
LYMPHS PCT: 22 %
MCH: 28.7 pg (ref 26.0–34.0)
MCHC: 33 g/dL (ref 32.0–36.0)
MCV: 86.9 fL (ref 80.0–100.0)
MONOS PCT: 28 %
Monocytes Absolute: 1.6 10*3/uL — ABNORMAL HIGH (ref 0.2–1.0)
Neutro Abs: 2.7 10*3/uL (ref 1.4–6.5)
Neutrophils Relative %: 48 %
PLATELETS: 171 10*3/uL (ref 150–440)
RBC: 2.95 MIL/uL — AB (ref 4.40–5.90)
RDW: 19.4 % — ABNORMAL HIGH (ref 11.5–14.5)
WBC: 5.7 10*3/uL (ref 3.8–10.6)

## 2016-03-07 LAB — SAMPLE TO BLOOD BANK

## 2016-03-07 MED ORDER — PALONOSETRON HCL INJECTION 0.25 MG/5ML
0.2500 mg | Freq: Once | INTRAVENOUS | Status: AC
  Administered 2016-03-07: 0.25 mg via INTRAVENOUS
  Filled 2016-03-07: qty 5

## 2016-03-07 MED ORDER — HEPARIN SOD (PORK) LOCK FLUSH 100 UNIT/ML IV SOLN: 500.0000 [IU] | Freq: Once | INTRAVENOUS | Status: DC | PRN

## 2016-03-07 MED ORDER — SODIUM CHLORIDE 0.9 % IV SOLN
2400.0000 mg/m2 | INTRAVENOUS | Status: DC
  Administered 2016-03-07: 3750 mg via INTRAVENOUS
  Filled 2016-03-07: qty 75

## 2016-03-07 MED ORDER — DEXAMETHASONE SODIUM PHOSPHATE 100 MG/10ML IJ SOLN: 10.0000 mg | Freq: Once | INTRAMUSCULAR | Status: DC

## 2016-03-07 MED ORDER — HEPARIN SOD (PORK) LOCK FLUSH 100 UNIT/ML IV SOLN: 500.0000 [IU] | Freq: Once | INTRAVENOUS | Status: DC

## 2016-03-07 MED ORDER — DEXTROSE 5 % IV SOLN
Freq: Once | INTRAVENOUS | Status: AC
  Administered 2016-03-07: 12:00:00 via INTRAVENOUS
  Filled 2016-03-07: qty 1000

## 2016-03-07 MED ORDER — DEXAMETHASONE SODIUM PHOSPHATE 10 MG/ML IJ SOLN
10.0000 mg | Freq: Once | INTRAMUSCULAR | Status: AC
  Administered 2016-03-07: 10 mg via INTRAVENOUS
  Filled 2016-03-07: qty 1

## 2016-03-07 MED ORDER — SODIUM CHLORIDE 0.9 % IJ SOLN
10.0000 mL | Freq: Once | INTRAMUSCULAR | Status: DC
  Filled 2016-03-07: qty 10

## 2016-03-07 MED ORDER — DEXTROSE 5 % IV SOLN
600.0000 mg | Freq: Once | INTRAVENOUS | Status: AC
  Administered 2016-03-07: 600 mg via INTRAVENOUS
  Filled 2016-03-07: qty 30

## 2016-03-07 MED ORDER — OXALIPLATIN CHEMO INJECTION 100 MG/20ML
65.0000 mg/m2 | Freq: Once | INTRAVENOUS | Status: AC
  Administered 2016-03-07: 100 mg via INTRAVENOUS
  Filled 2016-03-07: qty 20

## 2016-03-07 NOTE — Progress Notes (Signed)
Patient here for pre  Treatment check. Patient has productive cough, complaints of pain in his chest when couphing.

## 2016-03-07 NOTE — Assessment & Plan Note (Addendum)
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 Will get CT scan after this cycle.   # proceed with cycle #4 today.. Cbc- okay; Hb 8.5/ platelets- 167. Discussed my concerns with clinical progression/ left neck mass; if progression documented on imaging- plan possible anthracycle based chemo. Get MUGA scan.   # CKD creat 1.6--2.0; today 1.7 Monitor for now.   # RUL Pneumonia/aspiration- status post antibiotics; improved;  # chest wall pain - continue oxycodone liquid. Stable.  # follow up in 2 weeks/chemo-FOLFOX.; CT scanc prior/ MUGA scan.

## 2016-03-07 NOTE — Progress Notes (Signed)
.[place] NOTE  Patient Care Team: Adin Hector, MD as PCP - General (Internal Medicine) Clent Jacks, RN as Registered Nurse  Oncology History   # OCT 2016- SQUAMOUS CELL CA of middle esophagus; Stage IV [EGD bx]; PET- Esopahgeal uptake & 2 CM Left Cervical LN-MET SQUAM; Carbo-taxol [Nov 9th]with RT [Nov 14th];  Cont Weekly Carbo- RT; JAN 3rd- 2017-PET stable esophageal uptake/slight increase neck adenopathy- s/p local radiation to the neck with carboplatin-   # FEB 20th- START FOLFIRI s/p 5 cycles-May 2017- PET- Stable-esophageal uptake mediastinal LN/ Improved Left neck LN;   # June 26 PET- PROGRESSION; START ABRAXANE x3 cycles- OCT 2017 Progression Left neck  # OCT 23rd FOLFOX q2 W  # TAXOL HELD sec to ACUTE INFUSION REACTIONS  # malnutrition s/p PEG tube  # CKD [stage III]; Hx of non-invasive bladder ca [s/p cystectomy with neo-bladder; [facility]]  # Foundation One/MMR- STABLE; Oct 16th 2017- PDL-1 IHC-0%     Squamous cell esophageal cancer ([place])   05/14/2015 Initial Diagnosis    Squamous cell esophageal cancer (HCC)       Cancer of middle third of esophagus (HCC)     HISTORY OF PRESENTING ILLNESS:  Jeffrey [name] 65 y.o. African-American male with recent diagnosis of stage IV esophageal cancer [oligo-Metastatic] squamous cell carcinoma currently on  FOLFOX every 2 week; s/p cycyle #3 is here for a follow up.  Patient continues to complain of chest wall pain when coughing. Currently improved on Tusionex. No interim admission the hospital.  Patient did not notice any significant decrease in the size of his left neck adenopathy.  denies any pain in the neck.He continues to use his PEG tube ; weight stable. No significant diarrhea. No new tingling or numbness.  No fevers or chills.  ROS: A complete 10 point review of system is done which is negative except mentioned above in history of present illness.  MEDICAL HISTORY:  Past Medical  History:  Diagnosis Date  . Bladder cancer [facility])    s/p surgery and reconstruction  . Chronic kidney disease    stage 3  . Emphysema of lung ([place])   . Esophageal cancer ([place])   . Hypertension     SURGICAL HISTORY: Past Surgical History:  Procedure Laterality Date  . BLADDER REMOVAL    . ESOPHAGOGASTRODUODENOSCOPY N/A 05/22/2015   Procedure: ESOPHAGOGASTRODUODENOSCOPY (EGD);  Surgeon: Manya Silvas, MD;  Location: [facility] ENDOSCOPY;  Service: Endoscopy;  Laterality: N/A;  . ESOPHAGOGASTRODUODENOSCOPY (EGD) WITH PROPOFOL N/A 01/21/2015   Procedure: ESOPHAGOGASTRODUODENOSCOPY (EGD) WITH PROPOFOL-looking in the esophagus stomach and upper small intestine to evaluate and treat;  Surgeon: [name], MD;  Location[facility] ENDOSCOPY;  Service: Endoscopy;  Laterality: N/A;  . GASTROSTOMY W/ FEEDING TUBE Left    placed end of october 2016  . LYMPH NODE BIOPSY N/A 02/04/2015   Procedure: LYMPH NODE BIOPSY;  Surgeon: [name], MD;  Location: [facility] ORS;  Service: General;  Laterality: N/A;  . PORTACATH PLACEMENT Right 02/04/2015   Procedure: INSERTION PORT-A-CATH;  Surgeon: [name], MD;  Location: [facility] ORS;  Service: General;  Laterality: Right;    SOCIAL HISTORY: Social History   Social History  . Marital status: Married    Spouse name: N/A  . Number of children: N/A  . Years of education: N/A   Occupational History  . Not on file.   Social History Main Topics  . Smoking status: Former Smoker    Packs/day: 0.50  Years: 47.00    Types: Cigarettes    Quit date: 02/14/2015  . Smokeless tobacco: Former Systems developer    Types: Chew  . Alcohol use No     Comment: quit drinking 3 months ago  . Drug use: No  . Sexual activity: Not Currently   Other Topics Concern  . Not on file   Social History Narrative   Lives at home with wife. Independent at baseline.    FAMILY HISTORY: Family History  Problem Relation Age of Onset  . CAD Father   . Breast cancer Mother      ALLERGIES:  is allergic to aspirin and taxol [paclitaxel].  MEDICATIONS:  Current Outpatient Prescriptions  Medication Sig Dispense Refill  . chlorpheniramine-HYDROcodone (TUSSIONEX) 10-8 MG/5ML SUER Place 5 mLs into feeding tube every 12 (twelve) hours. 115 mL 0  . Nutritional Supplements (FEEDING SUPPLEMENT, JEVITY 1.5 CAL/FIBER,) LIQD Place 237 mLs into feeding tube 6 (six) times daily. 42660 mL 0  . oxyCODONE (ROXICODONE) 5 MG/5ML solution Place 5 mLs (5 mg total) into feeding tube every 6 (six) hours as needed for severe pain. 240 mL 0  . zolpidem (AMBIEN) 5 MG tablet Take 1 tablet (5 mg total) by mouth at bedtime as needed for sleep. 30 tablet 5  . amoxicillin-clavulanate (AUGMENTIN) 600-42.9 MG/5ML suspension Place 7.3 mLs (875 mg total) into feeding tube 2 (two) times daily. (Patient not taking: Reported on 03/07/2016) 75 mL 0  . predniSONE (DELTASONE) 5 MG tablet 4 tabs peg tube day1; 3 tabs day2; 2 tabs day 3; 1 tab day4 (Patient not taking: Reported on 03/07/2016) 10 tablet 0   No current facility-administered medications for this visit.    Facility-Administered Medications Ordered in Other Visits  Medication Dose Route Frequency Provider Last Rate Last Dose  . sodium chloride 0.9 % injection 10 mL  10 mL Intracatheter PRN Cammie Sickle, MD   10 mL at 03/04/15 0933      .  PHYSICAL EXAMINATION: ECOG PERFORMANCE STATUS: 1 - Symptomatic but completely ambulatory  Vitals:   03/07/16 1012  BP: 94/61  Pulse: (!) 123  Temp: (!) 96.8 F (36 C)   Filed Weights   03/07/16 1012  Weight: 106 lb 6.4 oz (48.3 kg)    GENERAL: Thin built; moderately nourished. Alert, no distress he is alone. He is walking.  EYES: no pallor or icterus OROPHARYNX: no thrush or ulceration; good dentition  NECK: supple, no masses felt LYMPH:  5x4 CM [increasing] palpable lymphadenopathy in the cervical on left side  LUNGS: Decreased breath sounds bilaterally No wheeze or  crackles HEART/CVS: regular rate & rhythm and no murmurs; No lower extremity edema ABDOMEN: abdomen soft, non-tender and normal bowel sounds; PEG tube in place Musculoskeletal:no cyanosis of digits and no clubbing  PSYCH: alert & oriented x 3 with fluent speech NEURO: no focal motor/sensory deficits SKIN:  no rashes or significant lesions  LABORATORY DATA:  I have reviewed the data as listed Lab Results  Component Value Date   WBC 5.7 03/07/2016   HGB 8.5 (L) 03/07/2016   HCT 25.7 (L) 03/07/2016   MCV 86.9 03/07/2016   PLT 171 03/07/2016    Recent Labs  02/08/16 0857  02/15/16 0523 02/16/16 0512 02/22/16 0946 03/07/16 0941  NA 133*  < > 143  --  138 133*  K 3.6  < > 3.6  --  4.1 3.8  CL 86*  < > 102  --  103 92*  CO2 37*  < >  34*  --  28 30  GLUCOSE 105*  < > 103*  --  142* 138*  BUN 70*  < > 85*  --  34* 36*  CREATININE 1.88*  < > 1.65* 1.49* 1.45* 1.71*  CALCIUM 9.0  < > 8.6*  --  8.6* 8.9  GFRNONAA 36*  < > 43* 48* 50* 41*  GFRAA 42*  < > 49* 56* 58* 47*  PROT 8.2*  --   --   --  7.5 8.0  ALBUMIN 3.2*  --   --   --  2.7* 2.8*  AST 19  --   --   --  17 21  ALT 8*  --   --   --  11* 7*  ALKPHOS 57  --   --   --  51 60  BILITOT 0.2*  --   --   --  0.7 0.4  < > = values in this interval not displayed.   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 Will get CT scan after this cycle.   # proceed with cycle #4 today.. Cbc- okay; Hb 8.5/ platelets- 167. Discussed my concerns with clinical progression/ left neck mass; if progression documented on imaging- plan possible anthracycle based chemo. Get MUGA scan.   # CKD creat 1.6--2.0; today 1.7 Monitor for now.   # RUL Pneumonia/aspiration- status post antibiotics; improved;  # chest wall pain - continue oxycodone liquid. Stable.  # follow up in 2 weeks/chemo-FOLFOX.;  CT scanc prior/ MUGA scan.     Cammie Sickle, MD 03/08/2016 8:10 AM

## 2016-03-09 ENCOUNTER — Inpatient Hospital Stay: Payer: Self-pay

## 2016-03-09 DIAGNOSIS — C154 Malignant neoplasm of middle third of esophagus: Secondary | ICD-10-CM

## 2016-03-09 DIAGNOSIS — C159 Malignant neoplasm of esophagus, unspecified: Secondary | ICD-10-CM

## 2016-03-09 MED ORDER — SODIUM CHLORIDE 0.9% FLUSH
10.0000 mL | INTRAVENOUS | Status: DC | PRN
  Administered 2016-03-09: 10 mL
  Filled 2016-03-09: qty 10

## 2016-03-09 MED ORDER — HEPARIN SOD (PORK) LOCK FLUSH 100 UNIT/ML IV SOLN
500.0000 [IU] | Freq: Once | INTRAVENOUS | Status: AC | PRN
  Administered 2016-03-09: 500 [IU]
  Filled 2016-03-09: qty 5

## 2016-03-10 ENCOUNTER — Encounter
Admission: RE | Admit: 2016-03-10 | Discharge: 2016-03-10 | Disposition: A | Discharge status: Home / Self Care | Payer: Self-pay | Source: Ambulatory Visit | Attending: Internal Medicine | Admitting: Internal Medicine

## 2016-03-10 DIAGNOSIS — Z5181 Encounter for therapeutic drug level monitoring: Secondary | ICD-10-CM | POA: Insufficient documentation

## 2016-03-10 DIAGNOSIS — Z79899 Other long term (current) drug therapy: Secondary | ICD-10-CM | POA: Insufficient documentation

## 2016-03-10 DIAGNOSIS — C154 Malignant neoplasm of middle third of esophagus: Secondary | ICD-10-CM | POA: Insufficient documentation

## 2016-03-10 MED ORDER — TECHNETIUM TC 99M-LABELED RED BLOOD CELLS IV KIT
20.0000 | PACK | Freq: Once | INTRAVENOUS | Status: AC | PRN
  Administered 2016-03-10: 23.1 via INTRAVENOUS

## 2016-03-18 ENCOUNTER — Ambulatory Visit: Admission: RE | Admit: 2016-03-18 | Payer: MEDICAID | Source: Ambulatory Visit

## 2016-03-21 ENCOUNTER — Inpatient Hospital Stay: Payer: Self-pay | Admitting: Internal Medicine

## 2016-03-21 ENCOUNTER — Emergency Department: Payer: Self-pay

## 2016-03-21 ENCOUNTER — Inpatient Hospital Stay: Payer: Self-pay

## 2016-03-21 ENCOUNTER — Inpatient Hospital Stay
Admission: EM | Admit: 2016-03-21 | Discharge: 2016-04-04 | DRG: 871 | Disposition: E | Payer: Self-pay | Attending: Specialist | Admitting: Specialist

## 2016-03-21 ENCOUNTER — Encounter: Payer: Self-pay | Admitting: Emergency Medicine

## 2016-03-21 ENCOUNTER — Telehealth: Payer: Self-pay | Admitting: *Deleted

## 2016-03-21 DIAGNOSIS — J9600 Acute respiratory failure, unspecified whether with hypoxia or hypercapnia: Secondary | ICD-10-CM

## 2016-03-21 DIAGNOSIS — A419 Sepsis, unspecified organism: Principal | ICD-10-CM

## 2016-03-21 DIAGNOSIS — Y95 Nosocomial condition: Secondary | ICD-10-CM | POA: Diagnosis present

## 2016-03-21 DIAGNOSIS — D638 Anemia in other chronic diseases classified elsewhere: Secondary | ICD-10-CM | POA: Diagnosis present

## 2016-03-21 DIAGNOSIS — E43 Unspecified severe protein-calorie malnutrition: Secondary | ICD-10-CM | POA: Diagnosis present

## 2016-03-21 DIAGNOSIS — Z886 Allergy status to analgesic agent status: Secondary | ICD-10-CM

## 2016-03-21 DIAGNOSIS — Z66 Do not resuscitate: Secondary | ICD-10-CM | POA: Diagnosis present

## 2016-03-21 DIAGNOSIS — R627 Adult failure to thrive: Secondary | ICD-10-CM | POA: Diagnosis present

## 2016-03-21 DIAGNOSIS — Z452 Encounter for adjustment and management of vascular access device: Secondary | ICD-10-CM

## 2016-03-21 DIAGNOSIS — Z978 Presence of other specified devices: Secondary | ICD-10-CM

## 2016-03-21 DIAGNOSIS — J189 Pneumonia, unspecified organism: Secondary | ICD-10-CM

## 2016-03-21 DIAGNOSIS — I129 Hypertensive chronic kidney disease with stage 1 through stage 4 chronic kidney disease, or unspecified chronic kidney disease: Secondary | ICD-10-CM | POA: Diagnosis present

## 2016-03-21 DIAGNOSIS — Z888 Allergy status to other drugs, medicaments and biological substances status: Secondary | ICD-10-CM

## 2016-03-21 DIAGNOSIS — J9601 Acute respiratory failure with hypoxia: Secondary | ICD-10-CM | POA: Diagnosis present

## 2016-03-21 DIAGNOSIS — Z931 Gastrostomy status: Secondary | ICD-10-CM

## 2016-03-21 DIAGNOSIS — N179 Acute kidney failure, unspecified: Secondary | ICD-10-CM

## 2016-03-21 DIAGNOSIS — R6521 Severe sepsis with septic shock: Secondary | ICD-10-CM | POA: Diagnosis present

## 2016-03-21 DIAGNOSIS — Z8551 Personal history of malignant neoplasm of bladder: Secondary | ICD-10-CM

## 2016-03-21 DIAGNOSIS — Z79899 Other long term (current) drug therapy: Secondary | ICD-10-CM

## 2016-03-21 DIAGNOSIS — J9602 Acute respiratory failure with hypercapnia: Secondary | ICD-10-CM | POA: Diagnosis present

## 2016-03-21 DIAGNOSIS — Z87891 Personal history of nicotine dependence: Secondary | ICD-10-CM

## 2016-03-21 DIAGNOSIS — Z9221 Personal history of antineoplastic chemotherapy: Secondary | ICD-10-CM

## 2016-03-21 DIAGNOSIS — C159 Malignant neoplasm of esophagus, unspecified: Secondary | ICD-10-CM | POA: Diagnosis present

## 2016-03-21 DIAGNOSIS — E875 Hyperkalemia: Secondary | ICD-10-CM | POA: Diagnosis present

## 2016-03-21 DIAGNOSIS — N183 Chronic kidney disease, stage 3 (moderate): Secondary | ICD-10-CM | POA: Diagnosis present

## 2016-03-21 DIAGNOSIS — J439 Emphysema, unspecified: Secondary | ICD-10-CM | POA: Diagnosis present

## 2016-03-21 LAB — CBC WITH DIFFERENTIAL/PLATELET
BASOS ABS: 0 10*3/uL (ref 0–0.1)
Basophils Relative: 0 %
Eosinophils Absolute: 0 10*3/uL (ref 0–0.7)
Eosinophils Relative: 0 %
HEMATOCRIT: 28.5 % — AB (ref 40.0–52.0)
HEMOGLOBIN: 9.4 g/dL — AB (ref 13.0–18.0)
LYMPHS PCT: 3 %
Lymphs Abs: 0.2 10*3/uL — ABNORMAL LOW (ref 1.0–3.6)
MCH: 28.7 pg (ref 26.0–34.0)
MCHC: 32.8 g/dL (ref 32.0–36.0)
MCV: 87.3 fL (ref 80.0–100.0)
Monocytes Absolute: 0.9 10*3/uL (ref 0.2–1.0)
Monocytes Relative: 19 %
NEUTROS ABS: 3.5 10*3/uL (ref 1.4–6.5)
NEUTROS PCT: 78 %
Platelets: 200 10*3/uL (ref 150–440)
RBC: 3.27 MIL/uL — AB (ref 4.40–5.90)
RDW: 20.6 % — ABNORMAL HIGH (ref 11.5–14.5)
WBC: 4.5 10*3/uL (ref 3.8–10.6)

## 2016-03-21 LAB — COMPREHENSIVE METABOLIC PANEL
ALBUMIN: 3 g/dL — AB (ref 3.5–5.0)
ALK PHOS: 82 U/L (ref 38–126)
ALT: 13 U/L — ABNORMAL LOW (ref 17–63)
ANION GAP: 20 — AB (ref 5–15)
AST: 33 U/L (ref 15–41)
BUN: 131 mg/dL — ABNORMAL HIGH (ref 6–20)
CO2: 22 mmol/L (ref 22–32)
Calcium: 9.7 mg/dL (ref 8.9–10.3)
Chloride: 97 mmol/L — ABNORMAL LOW (ref 101–111)
Creatinine, Ser: 3.63 mg/dL — ABNORMAL HIGH (ref 0.61–1.24)
GFR calc Af Amer: 19 mL/min — ABNORMAL LOW (ref >60)
GFR calc non Af Amer: 16 mL/min — ABNORMAL LOW (ref >60)
GLUCOSE: 194 mg/dL — AB (ref 65–99)
POTASSIUM: 5.6 mmol/L — AB (ref 3.5–5.1)
SODIUM: 139 mmol/L (ref 135–145)
Total Bilirubin: 0.7 mg/dL (ref 0.3–1.2)
Total Protein: 9.3 g/dL — ABNORMAL HIGH (ref 6.5–8.1)

## 2016-03-21 LAB — BLOOD GAS, ARTERIAL
Acid-base deficit: 1.5 mmol/L (ref 0.0–2.0)
BICARBONATE: 22.8 mmol/L (ref 20.0–28.0)
FIO2: 1
O2 Saturation: 79.1 %
PATIENT TEMPERATURE: 37
PH ART: 7.41 (ref 7.350–7.450)
pCO2 arterial: 36 mmHg (ref 32.0–48.0)
pO2, Arterial: 43 mmHg — ABNORMAL LOW (ref 83.0–108.0)

## 2016-03-21 LAB — TROPONIN I: Troponin I: 0.03 ng/mL (ref <0.03)

## 2016-03-21 LAB — EXPECTORATED SPUTUM ASSESSMENT W REFEX TO RESP CULTURE

## 2016-03-21 LAB — GLUCOSE, CAPILLARY: GLUCOSE-CAPILLARY: 130 mg/dL — AB (ref 65–99)

## 2016-03-21 LAB — PROCALCITONIN: PROCALCITONIN: 6.55 ng/mL

## 2016-03-21 LAB — LACTIC ACID, PLASMA
LACTIC ACID, VENOUS: 1.2 mmol/L (ref 0.5–1.9)
Lactic Acid, Venous: 2.1 mmol/L (ref 0.5–1.9)

## 2016-03-21 LAB — MRSA PCR SCREENING: MRSA BY PCR: NEGATIVE

## 2016-03-21 MED ORDER — MIDAZOLAM HCL 2 MG/2ML IJ SOLN
2.0000 mg | Freq: Once | INTRAMUSCULAR | Status: AC
  Administered 2016-03-21: 2 mg via INTRAVENOUS

## 2016-03-21 MED ORDER — SODIUM CHLORIDE 0.9 % IV SOLN
INTRAVENOUS | Status: DC
  Administered 2016-03-21: 17:00:00 via INTRAVENOUS

## 2016-03-21 MED ORDER — LEVALBUTEROL HCL 0.63 MG/3ML IN NEBU
0.6300 mg | INHALATION_SOLUTION | RESPIRATORY_TRACT | Status: DC
  Administered 2016-03-21: 0.63 mg via RESPIRATORY_TRACT
  Filled 2016-03-21: qty 3

## 2016-03-21 MED ORDER — NICOTINE 14 MG/24HR TD PT24
14.0000 mg | MEDICATED_PATCH | Freq: Every day | TRANSDERMAL | Status: DC
  Administered 2016-03-21: 19:00:00 14 mg via TRANSDERMAL
  Filled 2016-03-21: qty 1

## 2016-03-21 MED ORDER — LEVOFLOXACIN IN D5W 750 MG/150ML IV SOLN
750.0000 mg | Freq: Once | INTRAVENOUS | Status: AC
  Administered 2016-03-21: 750 mg via INTRAVENOUS
  Filled 2016-03-21: qty 150

## 2016-03-21 MED ORDER — NOREPINEPHRINE 4 MG/250ML-% IV SOLN
0.0000 ug/min | INTRAVENOUS | Status: DC
  Administered 2016-03-21: 15 ug/min via INTRAVENOUS
  Filled 2016-03-21 (×2): qty 250

## 2016-03-21 MED ORDER — ROCURONIUM BROMIDE 50 MG/5ML IV SOLN
1.0000 mg/kg | Freq: Once | INTRAVENOUS | Status: AC
  Administered 2016-03-21: 50 mg via INTRAVENOUS

## 2016-03-21 MED ORDER — CEFEPIME-DEXTROSE 1 GM/50ML IV SOLR
1.0000 g | INTRAVENOUS | Status: DC
  Filled 2016-03-21: qty 50

## 2016-03-21 MED ORDER — FENTANYL 2500MCG IN NS 250ML (10MCG/ML) PREMIX INFUSION
25.0000 ug/h | INTRAVENOUS | Status: DC
  Administered 2016-03-22: 50 ug/h via INTRAVENOUS
  Filled 2016-03-21: qty 250

## 2016-03-21 MED ORDER — ACETAMINOPHEN 325 MG PO TABS: 650.0000 mg | ORAL_TABLET | Freq: Four times a day (QID) | ORAL | Status: DC | PRN

## 2016-03-21 MED ORDER — FENTANYL CITRATE (PF) 100 MCG/2ML IJ SOLN
50.0000 ug | Freq: Once | INTRAMUSCULAR | Status: AC
  Administered 2016-03-22: 50 ug via INTRAVENOUS
  Filled 2016-03-21: qty 2

## 2016-03-21 MED ORDER — MORPHINE SULFATE (PF) 4 MG/ML IV SOLN
2.0000 mg | INTRAVENOUS | Status: DC | PRN
  Administered 2016-03-21: 19:00:00 2 mg via INTRAVENOUS
  Filled 2016-03-21: qty 1

## 2016-03-21 MED ORDER — ENOXAPARIN SODIUM 30 MG/0.3ML ~~LOC~~ SOLN: 30.0000 mg | SUBCUTANEOUS | Status: DC

## 2016-03-21 MED ORDER — DEXTROSE 5 % IV SOLN: 1.0000 g | INTRAVENOUS | Status: DC

## 2016-03-21 MED ORDER — VANCOMYCIN HCL IN DEXTROSE 1-5 GM/200ML-% IV SOLN
1000.0000 mg | Freq: Once | INTRAVENOUS | Status: AC
  Administered 2016-03-21: 1000 mg via INTRAVENOUS
  Filled 2016-03-21: qty 200

## 2016-03-21 MED ORDER — FENTANYL CITRATE (PF) 100 MCG/2ML IJ SOLN
100.0000 ug | Freq: Once | INTRAMUSCULAR | Status: AC
  Administered 2016-03-21: 100 ug via INTRAVENOUS

## 2016-03-21 MED ORDER — SODIUM CHLORIDE 0.9 % IV BOLUS (SEPSIS)
1000.0000 mL | Freq: Once | INTRAVENOUS | Status: AC
  Administered 2016-03-21: 1000 mL via INTRAVENOUS

## 2016-03-21 MED ORDER — SODIUM CHLORIDE 0.9 % IV SOLN: 500.0000 mg | INTRAVENOUS | Status: DC

## 2016-03-21 MED ORDER — ONDANSETRON HCL 4 MG/2ML IJ SOLN
INTRAMUSCULAR | Status: AC
  Administered 2016-03-21: 4 mg via INTRAVENOUS
  Filled 2016-03-21: qty 2

## 2016-03-21 MED ORDER — CEFEPIME-DEXTROSE 1 GM/50ML IV SOLR: 1.0000 g | Freq: Three times a day (TID) | INTRAVENOUS | Status: DC

## 2016-03-21 MED ORDER — JEVITY 1.5 CAL/FIBER PO LIQD
237.0000 mL | Freq: Every day | ORAL | Status: DC
  Administered 2016-03-21: 237 mL

## 2016-03-21 MED ORDER — LEVALBUTEROL HCL 0.63 MG/3ML IN NEBU: 0.6300 mg | INHALATION_SOLUTION | Freq: Four times a day (QID) | RESPIRATORY_TRACT | Status: DC | PRN

## 2016-03-21 MED ORDER — ROCURONIUM BROMIDE 50 MG/5ML IV SOLN
INTRAVENOUS | Status: AC
  Administered 2016-03-21: 50 mg via INTRAVENOUS
  Filled 2016-03-21: qty 1

## 2016-03-21 MED ORDER — SODIUM CHLORIDE 0.9 % IV BOLUS (SEPSIS)
500.0000 mL | Freq: Once | INTRAVENOUS | Status: AC
  Administered 2016-03-21: 20:00:00 500 mL via INTRAVENOUS

## 2016-03-21 MED ORDER — MIDAZOLAM HCL 2 MG/2ML IJ SOLN: 2.0000 mg | INTRAMUSCULAR | Status: DC | PRN

## 2016-03-21 MED ORDER — OXYCODONE HCL 5 MG/5ML PO SOLN
5.0000 mg | Freq: Four times a day (QID) | ORAL | Status: DC | PRN
  Administered 2016-03-21: 5 mg
  Filled 2016-03-21: qty 5

## 2016-03-21 MED ORDER — OXYCODONE HCL 5 MG/5ML PO SOLN: 5.0000 mg | Freq: Four times a day (QID) | ORAL | Status: DC | PRN

## 2016-03-21 MED ORDER — MIDAZOLAM HCL 2 MG/2ML IJ SOLN
INTRAMUSCULAR | Status: AC
  Administered 2016-03-21: 2 mg via INTRAVENOUS
  Filled 2016-03-21: qty 4

## 2016-03-21 MED ORDER — ONDANSETRON HCL 4 MG/2ML IJ SOLN
4.0000 mg | Freq: Four times a day (QID) | INTRAMUSCULAR | Status: DC | PRN
  Administered 2016-03-21: 4 mg via INTRAVENOUS

## 2016-03-21 MED ORDER — HYDROCOD POLST-CPM POLST ER 10-8 MG/5ML PO SUER: 5.0000 mL | Freq: Two times a day (BID) | ORAL | Status: DC

## 2016-03-21 MED ORDER — CEFEPIME-DEXTROSE 1 GM/50ML IV SOLR
1.0000 g | Freq: Once | INTRAVENOUS | Status: AC
  Administered 2016-03-21: 1 g via INTRAVENOUS
  Filled 2016-03-21: qty 50

## 2016-03-21 MED ORDER — SODIUM POLYSTYRENE SULFONATE 15 GM/60ML PO SUSP
30.0000 g | ORAL | Status: AC
  Administered 2016-03-21: 30 g
  Filled 2016-03-21: qty 120

## 2016-03-21 MED ORDER — NOREPINEPHRINE BITARTRATE 1 MG/ML IV SOLN: 0.0000 ug/min | INTRAVENOUS | Status: DC

## 2016-03-21 MED ORDER — FENTANYL BOLUS VIA INFUSION
50.0000 ug | INTRAVENOUS | Status: DC | PRN
  Filled 2016-03-21: qty 50

## 2016-03-21 MED ORDER — FENTANYL CITRATE (PF) 100 MCG/2ML IJ SOLN
INTRAMUSCULAR | Status: AC
  Administered 2016-03-21: 100 ug via INTRAVENOUS
  Filled 2016-03-21: qty 2

## 2016-03-21 NOTE — ED Notes (Signed)
Kortez Haagen (spouse) left contact information [phone] and was contacted with update on pt.

## 2016-03-21 NOTE — H&P (Addendum)
[facility] NAME: Jeffrey [name]    MR#:  [id number]  DATE OF BIRTH:  1951/03/25  DATE OF ADMISSION:  03/21/2016  PRIMARY CARE PHYSICIAN: Adin Hector, MD   REQUESTING/REFERRING PHYSICIAN: Harvest Dark, MD  CHIEF COMPLAINT:   Chief Complaint  Patient presents with  . Shortness of Breath   shortness of breath for 3 days. HISTORY OF PRESENT ILLNESS:  Jeffrey [name]  is a 65 y.o. male with a known history of Bladder cancer, esophageal cancer, CKD stage III, hypertension and emphysema. The patient presents to the ED with shortness of breath for 3 days. He also complains of cough and wheezing, chest pain while coughing. His O2 saturation decreased to 83% in room air. Chest x-ray showed right-sided pneumonia. He was diagnosed with pneumonia approximately 1 month ago and treated with antibiotics. He is due for chemotherapy today.  PAST MEDICAL HISTORY:   Past Medical History:  Diagnosis Date  . Bladder cancer [facility])    s/p surgery and reconstruction  . Chronic kidney disease    stage 3  . Emphysema of lung ([place])   . Esophageal cancer ([place])   . Hypertension     PAST SURGICAL HISTORY:   Past Surgical History:  Procedure Laterality Date  . BLADDER REMOVAL    . ESOPHAGOGASTRODUODENOSCOPY N/A 05/22/2015   Procedure: ESOPHAGOGASTRODUODENOSCOPY (EGD);  Surgeon: Manya Silvas, MD;  Location: [facility] ENDOSCOPY;  Service: Endoscopy;  Laterality: N/A;  . ESOPHAGOGASTRODUODENOSCOPY (EGD) WITH PROPOFOL N/A 01/21/2015   Procedure: ESOPHAGOGASTRODUODENOSCOPY (EGD) WITH PROPOFOL-looking in the esophagus stomach and upper small intestine to evaluate and treat;  Surgeon: [name], MD;  Location: [facility] ENDOSCOPY;  Service: Endoscopy;  Laterality: N/A;  . GASTROSTOMY W/ FEEDING TUBE Left    placed end of october 2016  . LYMPH NODE BIOPSY N/A 02/04/2015   Procedure: LYMPH NODE BIOPSY;  Surgeon: [name], MD;  Location: [facility] ORS;   Service: General;  Laterality: N/A;  . PORTACATH PLACEMENT Right 02/04/2015   Procedure: INSERTION PORT-A-CATH;  Surgeon: [name], MD;  Location: [facility] ORS;  Service: General;  Laterality: Right;    SOCIAL HISTORY:   Social History  Substance Use Topics  . Smoking status: Former Smoker    Packs/day: 0.50    Years: 47.00    Types: Cigarettes    Quit date: 02/14/2015  . Smokeless tobacco: Former Systems developer    Types: Chew  . Alcohol use No     Comment: quit drinking 3 months ago    FAMILY HISTORY:   Family History  Problem Relation Age of Onset  . CAD Father   . Breast cancer Mother     DRUG ALLERGIES:   Allergies  Allergen Reactions  . Aspirin Nausea Only  . Taxol [Paclitaxel] Other (See Comments)    Diaphoretic, flushing and tightness in chest    REVIEW OF SYSTEMS:   Review of Systems  Constitutional: Positive for chills, fever and malaise/fatigue.  HENT: Negative for congestion.   Eyes: Negative for blurred vision and double vision.  Respiratory: Positive for cough, sputum production and shortness of breath. Negative for hemoptysis, wheezing and stridor.   Cardiovascular: Positive for chest pain. Negative for leg swelling.       Chest pain while coughing  Gastrointestinal: Positive for nausea. Negative for abdominal pain, blood in stool, diarrhea, melena and vomiting.  Genitourinary: Negative for dysuria and hematuria.  Musculoskeletal: Negative for joint pain.  Neurological: Positive for weakness. Negative  for dizziness, focal weakness and loss of consciousness.  Psychiatric/Behavioral: Negative for depression. The patient is not nervous/anxious.     MEDICATIONS AT HOME:   Prior to Admission medications   Medication Sig Start Date End Date Taking? Authorizing Provider  chlorpheniramine-HYDROcodone (TUSSIONEX) 10-8 MG/5ML SUER Place 5 mLs into feeding tube every 12 (twelve) hours. 03/03/16  Yes Cammie Sickle, MD  Nutritional Supplements (FEEDING  SUPPLEMENT, JEVITY 1.5 CAL/FIBER,) LIQD Place 237 mLs into feeding tube 6 (six) times daily. 02/17/16  Yes Loletha Grayer, MD  oxyCODONE (ROXICODONE) 5 MG/5ML solution Place 5 mLs (5 mg total) into feeding tube every 6 (six) hours as needed for severe pain. 02/24/16  Yes Cammie Sickle, MD  predniSONE (DELTASONE) 5 MG tablet 4 tabs peg tube day1; 3 tabs day2; 2 tabs day 3; 1 tab day4 Patient not taking: Reported on 03/23/2016 02/17/16   Loletha Grayer, MD  zolpidem (AMBIEN) 5 MG tablet Take 1 tablet (5 mg total) by mouth at bedtime as needed for sleep. Patient not taking: Reported on 04/01/2016 12/23/15   Laverle Hobby, MD      VITAL SIGNS:  Blood pressure 131/87, pulse 90, resp. rate (!) 26, height 5\' 7"  (1.702 m), weight 105 lb (47.6 kg), SpO2 100 %.  PHYSICAL EXAMINATION:  Physical Exam  GENERAL:  65 y.o.-year-old patient lying in the bed with no acute distress. Malnutrition. EYES: Pupils equal, round, reactive to light and accommodation. No scleral icterus. Extraocular muscles intact.  HEENT: Head atraumatic, normocephalic. Oropharynx and nasopharynx clear.  NECK:  Supple, no jugular venous distention. No thyroid enlargement, no tenderness. A large nonmobile lymph node on the left side of the neck. LUNGS: Normal breath sounds bilaterally, no wheezing, rales,rhonchi or crepitation. No use of accessory muscles of respiration.  CARDIOVASCULAR: S1, S2 normal. No murmurs, rubs, or gallops.  ABDOMEN: Soft, nontender, nondistended. Bowel sounds present. No organomegaly or mass. PEG tube in situ. EXTREMITIES: No pedal edema, cyanosis, or clubbing.  NEUROLOGIC: Cranial nerves II through XII are intact. Muscle strength 4/5 in all extremities. Sensation intact. Gait not checked.  PSYCHIATRIC: The patient is alert and oriented x 3.  SKIN: No obvious rash, lesion, or ulcer.   LABORATORY PANEL:   CBC  Recent Labs Lab 03/24/2016 1018  WBC 4.5  HGB 9.4*  HCT 28.5*  PLT 200    ------------------------------------------------------------------------------------------------------------------  Chemistries   Recent Labs Lab 03/08/2016 0949  NA 139  K 5.6*  CL 97*  CO2 22  GLUCOSE 194*  BUN 131*  CREATININE 3.63*  CALCIUM 9.7  AST 33  ALT 13*  ALKPHOS 82  BILITOT 0.7   ------------------------------------------------------------------------------------------------------------------  Cardiac Enzymes  Recent Labs Lab 03/20/2016 1018  TROPONINI 0.03*   ------------------------------------------------------------------------------------------------------------------  RADIOLOGY:  Dg Chest 2 View  Result Date: 03/28/2016 CLINICAL DATA:  Short of breath, right rhonchi. Esophageal cancer and emphysema EXAM: CHEST  2 VIEW COMPARISON:  02/14/2016 FINDINGS: COPD with pulmonary hyperinflation. Right middle lobe infiltrate similar to the prior study and likely due to pneumonia. Improvement in mild left lower lobe infiltrate. Improvement in right upper lobe infiltrate. No effusion or heart failure. Port-A-Cath tip at the cavoatrial junction unchanged. Right mediastinal contour bulge may be related to mediastinal mass and adenopathy. Dilated bowel loops.  Gastrostomy. IMPRESSION: COPD with pulmonary hyperinflation. Right middle lobe infiltrate unchanged, probable pneumonia. Improvement in left lower lobe and right upper lobe infiltrates Mediastinal mass. Dilated bowel loops, probable ileus versus bowel obstruction. Electronically Signed   By: Franchot Gallo M.D.  On: 03/30/2016 10:36      IMPRESSION AND PLAN:   Pneumonia (HAP) The patient will be admitted to medical floor. I will continue cefepime and vancomycin, DuoNeb, Robitussin when necessary, follow-up CBC and cultures.  Acute respiratory failure with hypoxia due to pneumonia and COPD.. O2 Wall, NEB.  Acute renal failure on CKD3. Start normal saline IV and follow-up BMP. Nephrology consult and renal  ultrasound.  Hyperkalemia. Due to acute renal failure. Give Kayexalate and IV fluid support, follow-up BMP.  Anemia of chronic disease. Stable. Severe malnutrition. Dietitian consult. Continue tube feeding.  Tobacco abuse. Smoking cessation was counseled for 3 minutes, given nicotine patch.  All the records are reviewed and case discussed with ED provider. Management plans discussed with the patient, his wife and they are in agreement.  CODE STATUS: Full code.  TOTAL TIME TAKING CARE OF THIS PATIENT: 60 minutes.    Demetrios Loll M.D on 04/03/2016 at 2:12 PM  Between 7am to 6pm - Pager - [phone]  After 6pm go to [email or web address] - Proofreader  [employer] [facility] [facility]  Office  [phone]  CC: Primary care physician; Adin Hector, MD   Note: This dictation was prepared with Dragon dictation along with smaller phrase technology. Any transcriptional errors that result from this process are unintentional.

## 2016-03-21 NOTE — Procedures (Signed)
Endotracheal Intubation: Patient required placement of an artificial airway secondary to resp failure.   Consent: Emergent.   Hand washing performed prior to starting the procedure.   Medications administered for sedation prior to procedure: Midazolam 4 mg IV,  Rocuronium 10 mg IV, Fentanyl 100 mcg IV.   Procedure: A time out procedure was called and correct patient, name, & ID confirmed. Needed supplies and equipment were assembled and checked to include ETT, 10 ml syringe, Glidescope, Mac and Miller blades, suction, oxygen and bag mask valve, end tidal CO2 monitor. Patient was positioned to align the mouth and pharynx to facilitate visualization of the glottis.  Heart rate, SpO2 and blood pressure was continuously monitored during the procedure. Pre-oxygenation was conducted prior to intubation and endotracheal tube was placed through the vocal cords into the trachea.  During intubation an assistant applied gentle pressure to the cricoid cartilage.   The artificial airway was placed under direct visualization via glidescope route using a 7.5 ETT on the first attempt.    ETT was secured at 23 cm mark.    Placement was confirmed by auscuitation of lungs with good breath sounds bilaterally and no stomach sounds.  Condensation was noted on endotracheal tube.  Pulse ox 90%.  CO2 detector in place with appropriate color change.   Complications: None .   Operator: Linsi Humann.   Chest radiograph ordered and pending.    Corrin Parker, M.D.  [facility]  Medical Director [place] Director [facility] Cardio-Pulmonary Department

## 2016-03-21 NOTE — ED Provider Notes (Signed)
[facility] Emergency Department Provider Note  Time seen: 9:52 AM  I have reviewed the triage vital signs and the nursing notes.   HISTORY  Chief Complaint Shortness of Breath    HPI Jeffrey [name] is a 65 y.o. male with a past medical history of esophageal cancer, chronic kidney disease, hypertension, emphysema, who presents to the emergency department with shortness of breath. According to the patient the past 2 or 3 days he has been feeling short of breath. Upon arrival to the emergency department the patient has a room air oxygen saturation of 83%, no home oxygen requirement. Patient states cough for the past 1-2 days, denies sputum production. Denies fever or vomiting. Denies abdominal pain or chest pain. Patient does state he was diagnosed with pneumonia approximately 1 month ago but had been feeling better after antibiotics. Patient is currently on chemotherapy  Past Medical History:  Diagnosis Date  . Bladder cancer [facility])    s/p surgery and reconstruction  . Chronic kidney disease    stage 3  . Emphysema of lung ([place])   . Esophageal cancer ([place])   . Hypertension     Patient Active Problem List   Diagnosis Date Noted  . Encounter for monitoring cardiotoxic drug therapy 03/07/2016  . Sepsis ([place]) 11/23/2015  . Cough 10/30/2015  . Cancer of middle third of esophagus ([place]) 10/19/2015  . Other iron deficiency anemia 10/05/2015  . Adverse effect of paclitaxel 09/25/2015  . Encounter for antineoplastic chemotherapy 09/21/2015  . Weight loss 09/21/2015  . Anemia due to chemotherapy 09/21/2015  . Diarrhea 09/21/2015  . Status post insertion of percutaneous endoscopic gastrostomy (PEG) tube ([place]) 09/21/2015  . Nausea & vomiting 05/20/2015  . 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 Esophageal cancer ([place]) 02/04/2015  . Esophageal mass 01/21/2015  . Dehydration 01/21/2015  . CKD  (chronic kidney disease) stage 3, GFR 30-59 ml/min 01/21/2015  . Bladder cancer ([place]) 01/21/2015  . Protein-calorie malnutrition, severe 01/21/2015  . Dysphagia 01/20/2015    Past Surgical History:  Procedure Laterality Date  . BLADDER REMOVAL    . ESOPHAGOGASTRODUODENOSCOPY N/A 05/22/2015   Procedure: ESOPHAGOGASTRODUODENOSCOPY (EGD);  Surgeon: Manya Silvas, MD;  Location: [facility] ENDOSCOPY;  Service: Endoscopy;  Laterality: N/A;  . ESOPHAGOGASTRODUODENOSCOPY (EGD) WITH PROPOFOL N/A 01/21/2015   Procedure: ESOPHAGOGASTRODUODENOSCOPY (EGD) WITH PROPOFOL-looking in the esophagus stomach and upper small intestine to evaluate and treat;  Surgeon: [name], MD;  Location: [facility] ENDOSCOPY;  Service: Endoscopy;  Laterality: N/A;  . GASTROSTOMY W/ FEEDING TUBE Left    placed end of october 2016  . LYMPH NODE BIOPSY N/A 02/04/2015   Procedure: LYMPH NODE BIOPSY;  Surgeon: [name], MD;  Location: [facility] ORS;  Service: General;  Laterality: N/A;  . PORTACATH PLACEMENT Right 02/04/2015   Procedure: INSERTION PORT-A-CATH;  Surgeon: [name], MD;  Location: [facility] ORS;  Service: General;  Laterality: Right;    Prior to Admission medications   Medication Sig Start Date End Date Taking? Authorizing Provider  amoxicillin-clavulanate (AUGMENTIN) 600-42.9 MG/5ML suspension Place 7.3 mLs (875 mg total) into feeding tube 2 (two) times daily. Patient not taking: Reported on 03/07/2016 02/17/16   Loletha Grayer, MD  chlorpheniramine-HYDROcodone (TUSSIONEX) 10-8 MG/5ML SUER Place 5 mLs into feeding tube every 12 (twelve) hours. 03/03/16   Cammie Sickle, MD  Nutritional Supplements (FEEDING SUPPLEMENT, JEVITY 1.5 CAL/FIBER,) LIQD Place 237 mLs into feeding tube 6 (six) times daily. 02/17/16  Loletha Grayer, MD  oxyCODONE (ROXICODONE) 5 MG/5ML solution Place 5 mLs (5 mg total) into feeding tube every 6 (six) hours as needed for severe pain. 02/24/16   Cammie Sickle, MD  predniSONE  (DELTASONE) 5 MG tablet 4 tabs peg tube day1; 3 tabs day2; 2 tabs day 3; 1 tab day4 Patient not taking: Reported on 03/07/2016 02/17/16   Loletha Grayer, MD  zolpidem (AMBIEN) 5 MG tablet Take 1 tablet (5 mg total) by mouth at bedtime as needed for sleep. 12/23/15   Laverle Hobby, MD    Allergies  Allergen Reactions  . Aspirin Nausea Only  . Taxol [Paclitaxel] Other (See Comments)    Diaphoretic, flushing and tightness in chest    Family History  Problem Relation Age of Onset  . CAD Father   . Breast cancer Mother     Social History Social History  Substance Use Topics  . Smoking status: Former Smoker    Packs/day: 0.50    Years: 47.00    Types: Cigarettes    Quit date: 02/14/2015  . Smokeless tobacco: Former Systems developer    Types: Chew  . Alcohol use No     Comment: quit drinking 3 months ago    Review of Systems Constitutional: Negative for fever. Cardiovascular: Negative for chest pain. Respiratory: Positive for shortness breath. Positive for cough. Negative for sputum production. Gastrointestinal: Negative for abdominal pain, vomiting Genitourinary: Negative for dysuria Neurological: Negative for headache 10-point ROS otherwise negative.  ____________________________________________   PHYSICAL EXAM:  VITAL SIGNS: ED Triage Vitals [03/17/2016 0931]  Enc Vitals Group     BP 94/68     Pulse Rate 78     Resp 20     Temp      Temp src      SpO2 (!) 83 %     Weight 105 lb (47.6 kg)     Height 5\' 7"  (1.702 m)     Head Circumference      Peak Flow      Pain Score      Pain Loc      Pain Edu?      Excl. in [place]?     Constitutional: Alert and oriented. Well appearing and in no distress. Eyes: Normal exam ENT   Head: Normocephalic and atraumatic   Mouth/Throat: Mucous membranes are moist.Left neck mass which the patient states is chronic. Normal tympanic membranes bilaterally. Cardiovascular: Normal rate, regular rhythm.  Respiratory: Mild tachypnea  around 20-25 breast per minute. Patient has considerable rhonchi auscultated in the right lung fields, no wheeze  Gastrointestinal: Soft and nontender. No distention.  Musculoskeletal: Nontender with normal range of motion in all extremities. Neurologic:  Normal speech and language. No gross focal neurologic deficits  Skin:  Skin is warm, dry and intact.  Psychiatric: Mood and affect are normal.   ____________________________________________    EKG  EKG reviewed and interpreted, so shows normal sinus rhythm at 96 bpm, narrow QRS, normal axis, normal intervals, nonspecific but no concerning ST changes.  ____________________________________________    RADIOLOGY  Chest x-ray shows COPD with right middle lobe pneumonia. Ileus versus bowel obstruction.  ____________________________________________   INITIAL IMPRESSION / ASSESSMENT AND PLAN / ED COURSE  Pertinent labs & imaging results that were available during my care of the patient were reviewed by me and considered in my medical decision making (see chart for details).  Patient presents the emergency department with shortness of breath, cough, room air saturation of 83% with rhonchi in  the right lung fields. Highly suspect pneumonia. We will check labs, dose IV fluids, obtain a chest x-ray and continue to closely monitor in the emergency department while awaiting results.  X-ray shows right-sided pneumonia and also states ileus first bowel obstruction. Patient states bowel movement this morning, nontender abdomen. Given the patient's recent hospitalization he will be admitted and treated for healthcare associated pneumonia. Sepsis protocols have been initiated for the patient.  ____________________________________________   FINAL CLINICAL IMPRESSION(S) / ED DIAGNOSES  Acute renal insufficiency Dyspnea Pneumonia   Harvest Dark, MD 03/28/2016 [phone]

## 2016-03-21 NOTE — ED Triage Notes (Signed)
Increasing SOB x 3 days, history of cancer. Appears weak and pale.

## 2016-03-21 NOTE — Procedures (Signed)
Central Venous Catheter Placement: Indication: Patient receiving vesicant or irritant drug.; Patient receiving intravenous therapy for longer than 5 days.; Patient has limited or no vascular access.   Consent:emergent  Risks and benefits explained in detail including risk of infection, bleeding, respiratory failure and death..   Hand washing performed prior to starting the procedure.   Procedure: An active timeout was performed and correct patient, name, & ID confirmed.  After explaining risk and benefits, patient was positioned correctly for central venous access. Patient was prepped using strict sterile technique including chlorohexadine preps, sterile drape, sterile gown and sterile gloves.  The area was prepped, draped and anesthetized in the usual sterile manner. Patient comfort was obtained.  A triple lumen catheter was placed in RT Internal Jugular Vein There was good blood return, catheter caps were placed on lumens, catheter flushed easily, the line was secured and a sterile dressing and BIO-PATCH applied.   Ultrasound was used to visualize vasculature and guidance of needle.   Number of Attempts: 1 Complications:none Estimated Blood Loss: none Chest Radiograph indicated and ordered.  Operator: Kyaire Gruenewald/Blakeney.   Roxine Whittinghill David Tanee Henery, M.D.  [facility]  Medical Director [facility] [place] Medical Director [facility] Cardio-Pulmonary Department     

## 2016-03-21 NOTE — Telephone Encounter (Signed)
Called to report that she is taking him to the ER for SOB, inability to ambulate

## 2016-03-21 NOTE — Consult Note (Signed)
  Pharmacy Antibiotic Note  Jeffrey [name] is a 65 y.o. male admitted on 03/20/2016 with pneumonia.  Pharmacy has been consulted for vancomycin and cefepime dosing.  Plan: Pt received 1g of vancomycin in ED. Will give next dose in 35 hours for stacked dosing Vancomycin 500 IV every 48 hours.  Goal trough 15-20 mcg/mL. cefepime 1g q 24hr   Trough 12/25 @ 2230 Monitor renal function closely  Height: 5\' 7"  (170.2 cm) Weight: 105 lb (47.6 kg) IBW/kg (Calculated) : 66.1  No data recorded.   Recent Labs Lab 03/08/2016 0949 03/06/2016 1018 03/09/2016 1019  WBC  --  4.5  --   CREATININE 3.63*  --   --   LATICACIDVEN  --   --  2.1*    Estimated Creatinine Clearance: 14 mL/min (by C-G formula based on SCr of 3.63 mg/dL (H)).    Allergies  Allergen Reactions  . Aspirin Nausea Only  . Taxol [Paclitaxel] Other (See Comments)    Diaphoretic, flushing and tightness in chest    Antimicrobials this admission: vancomycin 12/18 >>  cefepime 12/18 >>  Levofloxacin 12/18>>one dose   Dose adjustments this admission:   Microbiology results: 12/18 BCx:  12/18 Sputum: needs to be collected  Strep pneu, antigen   Thank you for allowing pharmacy to be a part of this patient's care.  Ramond Dial, Pharm.D, BCPS Clinical Pharmacist  03/24/2016 2:02 PM

## 2016-03-21 NOTE — Progress Notes (Signed)
Pt. Transported to ICU on BIPAP.

## 2016-03-21 NOTE — Consult Note (Signed)
Name: Jeffrey [name] MRN: [id number] DOB: 04/04/1951    ADMISSION DATE:  03/08/2016 CONSULTATION DATE:  03/08/2016  REFERRING MD :  Dr. Tressia Miners  CHIEF COMPLAINT:  Shortness of Breath  BRIEF PATIENT DESCRIPTION:  This is a 65 yo male who presented to [facility] ER 12/18 c/o worsening cough, pleuritic chest pain during cough, and wheezing.  He was subsequently admitted to [facility] oncology unit due to acute hypoxic respiratory failure secondary to RUL pneumonia.    SIGNIFICANT EVENTS  12/18-Admitted to oncology unit due to worsening shortness of breath secondary to RUL pneumonia 12/18-Pt transferred to ICU due to worsening dyspnea secondary to RUL pneumonia requiring Bipap PCCM consulted for additional management   STUDIES:  None   HISTORY OF PRESENT ILLNESS:   This is a 65 yo male with a PMH of Bladder Cancer s/p radical cystectomy with neobladder urinary diversion, pelvic lymph node dissection, and bilateral ureterotomies at [facility] (12/2011), Squamous Cell Esophageal Cancer Stage IV dx 05/13/2105 (currently receiving radiation), Severe Protein Malnutrition, Gastrostomy Tube, Aspiration Pneumonia, Right chest portacath, CKD stage 3 (baseline creat 1.6-2.0), Emphysema, ETOH abuse, Chronic chest wall pain, Former Smoker, Hyperlipidemia, and HTN.  He presented to [facility] ER 12/18 with c/o worsening cough, pleuritic chest pain during coughing, and wheezing onset 12/15.  Upon arrival to ER his O2 sats were 83% on RA and chest xray revealed right sided pneumonia.  He was diagnosed with pneumonia 1 month ago and was treated with antibiotics.  He was subsequently admitted to the oncology unit this admission for further treatment.  On 12/18 PCCM contacted for additional management of acute hypoxic respiratory failure secondary to RUL pneumonia requiring Bipap and transfer to ICU.  PAST MEDICAL HISTORY :   has a past medical history of Bladder cancer ([place]); Chronic kidney disease; Emphysema of lung ([place]);  Esophageal cancer ([place]); and Hypertension.  has a past surgical history that includes Bladder removal; Esophagogastroduodenoscopy (egd) with propofol (N/A, 01/21/2015); Gastrostomy w/ feeding tube (Left); Portacath placement (Right, 02/04/2015); Lymph node biopsy (N/A, 02/04/2015); and Esophagogastroduodenoscopy (N/A, 05/22/2015). Prior to Admission medications   Medication Sig Start Date End Date Taking? Authorizing Provider  chlorpheniramine-HYDROcodone (TUSSIONEX) 10-8 MG/5ML SUER Place 5 mLs into feeding tube every 12 (twelve) hours. 03/03/16  Yes Cammie Sickle, MD  Nutritional Supplements (FEEDING SUPPLEMENT, JEVITY 1.5 CAL/FIBER,) LIQD Place 237 mLs into feeding tube 6 (six) times daily. 02/17/16  Yes Loletha Grayer, MD  oxyCODONE (ROXICODONE) 5 MG/5ML solution Place 5 mLs (5 mg total) into feeding tube every 6 (six) hours as needed for severe pain. 02/24/16  Yes Cammie Sickle, MD  predniSONE (DELTASONE) 5 MG tablet 4 tabs peg tube day1; 3 tabs day2; 2 tabs day 3; 1 tab day4 Patient not taking: Reported on 04/03/2016 02/17/16   Loletha Grayer, MD  zolpidem (AMBIEN) 5 MG tablet Take 1 tablet (5 mg total) by mouth at bedtime as needed for sleep. Patient not taking: Reported on 04/01/2016 12/23/15   Laverle Hobby, MD   Allergies  Allergen Reactions  . Aspirin Nausea Only  . Taxol [Paclitaxel] Other (See Comments)    Diaphoretic, flushing and tightness in chest    FAMILY HISTORY:  family history includes Breast cancer in his mother; CAD in his father. SOCIAL HISTORY:  reports that he quit smoking about 13 months ago. His smoking use included Cigarettes. He has a 23.50 pack-year smoking history. He has quit using smokeless tobacco. His smokeless tobacco use included Chew. He reports that he does not drink  alcohol or use drugs.  REVIEW OF SYSTEMS:  Positives in BOLD Constitutional: Negative for fever, chills, weight loss, malaise/fatigue and diaphoresis.  HENT: Negative  for hearing loss, ear pain, nosebleeds, congestion, sore throat, neck pain, tinnitus and ear discharge.   Eyes: Negative for blurred vision, double vision, photophobia, pain, discharge and redness.  Respiratory: cough, hemoptysis, sputum production, shortness of breath, wheezing and stridor.   Cardiovascular: pleuritic chest pain, palpitations, orthopnea, claudication, leg swelling and PND.  Gastrointestinal: heartburn, nausea, vomiting, abdominal pain, diarrhea, constipation, blood in stool and melena.  Genitourinary: Negative for dysuria, urgency, frequency, hematuria and flank pain.  Musculoskeletal: Negative for myalgias, back pain, joint pain and falls.  Skin: Negative for itching and rash.  Neurological: Negative for dizziness, tingling, tremors, sensory change, speech change, focal weakness, seizures, loss of consciousness, weakness and headaches.  Endo/Heme/Allergies: Negative for environmental allergies and polydipsia. Does not bruise/bleed easily.  SUBJECTIVE:  Pt severely dyspneic on Bipap and c/o nausea  VITAL SIGNS: Temp:  [98.4 F (36.9 C)] 98.4 F (36.9 C) (12/18 1611) Pulse Rate:  [78-98] 94 (12/18 1611) Resp:  [18-29] 21 (12/18 1738) BP: (89-131)/(68-96) 105/76 (12/18 1611) SpO2:  [2 %-100 %] 92 % (12/18 1738) FiO2 (%):  [44 %] 44 % (12/18 1608) Weight:  [99 lb 1.6 oz (45 kg)-105 lb (47.6 kg)] 99 lb 1.6 oz (45 kg) (12/18 1611)  PHYSICAL EXAMINATION: General:  Chronically ill cachetic AA male  Neuro:  Alert and oriented, following commands, PERRLA HEENT:  Supple, JVD Cardiovascular:  S1s2, sinus tachycardia, no M/R/G Lungs:  Rhonchi bilateral bases, tachypneic on Bipap Abdomen:  Hypoactive BS x4, soft, non tender, non distended Musculoskeletal:  Severely cachetic, no edema Skin:  Intact no rashes or lesions   Recent Labs Lab 03/23/2016 0949  NA 139  K 5.6*  CL 97*  CO2 22  BUN 131*  CREATININE 3.63*  GLUCOSE 194*    Recent Labs Lab 03/14/2016 1018  HGB  9.4*  HCT 28.5*  WBC 4.5  PLT 200   Dg Chest 2 View  Result Date: 03/06/2016 CLINICAL DATA:  Short of breath, right rhonchi. Esophageal cancer and emphysema EXAM: CHEST  2 VIEW COMPARISON:  02/14/2016 FINDINGS: COPD with pulmonary hyperinflation. Right middle lobe infiltrate similar to the prior study and likely due to pneumonia. Improvement in mild left lower lobe infiltrate. Improvement in right upper lobe infiltrate. No effusion or heart failure. Port-A-Cath tip at the cavoatrial junction unchanged. Right mediastinal contour bulge may be related to mediastinal mass and adenopathy. Dilated bowel loops.  Gastrostomy. IMPRESSION: COPD with pulmonary hyperinflation. Right middle lobe infiltrate unchanged, probable pneumonia. Improvement in left lower lobe and right upper lobe infiltrates Mediastinal mass. Dilated bowel loops, probable ileus versus bowel obstruction. Electronically Signed   By: Franchot Gallo M.D.   On: 03/24/2016 10:36    ASSESSMENT / PLAN: Acute hypoxic respiratory failure secondary RUL pneumonia Septic shock secondary to Pneumonia  Acute on chronic renal failure Hyperkalemia Hx: COPD and Aspiration Pneumonia P: Continuous Bipap for now wean as tolerated Maintain O2 sats 88% to 92% Aggressive Bronchodilators Continue Cefepime and Vancomycin Recheck BMP 12/18 Trend PCT's and lactic acid Trend WBC's and monitor fever curve Strep pneumoniae urinary antigen pending Follow cultures Maintain map >65 NS at 75 ml/hr Nephrology consulted appreciate input Renal Ultrasound pending  Monitor urinary output  Aspiration precautions Keep NPO HIGH RISK FOR INTUBATION  Update: Spoke with pt and pts wife 12/18 discussed code status and overall quality of life extensively and  all questions answered she stated she would discuss code status with her husband.  Marda Stalker, [place] Pager [phone] (please enter 7 digits) PCCM Consult Pager [phone]  (please enter 7 digits)

## 2016-03-21 NOTE — Progress Notes (Addendum)
Called in for acute hypoxic respiratory failure.  Jeffrey [name] is a 65y/oM with past medical history significant for esophageal cancer, bladder cancer, CK D stage III, hypertension, COPD presents to Hospital today with cough and dyspnea. He is admitted for sepsis secondary to pneumonia. He was on the floor, called his RN and said that he couldn't breathe. He was noted to be tachypneic and O2 sats on 6 L of oxygen were in the 70s. ABG ordered showing significant hypoxemia. CODE STATUS was discussed with patient who was completely alert and oriented and he requested to be a full code.  Physical examination:  Gen.-appears critically ill, very tachypneic. Neck-large left ant neck node Lungs- diffuse scattered wheeze, decreased bibasilar breath sounds. cardiovascular- s1, s2, rapid and regular. No murmurs/rubs or gallops Abdomen- soft, discomfort in left lower quadrant, no guarding or rigidity. Hypoactive bowel sounds. Neurologically-following commands, moving all extremities. Patient is alert and oriented 3  Plan:  ABG stat ordered showing hypoxia Borderline hypotensive- fluid bolus ordered Transfer to ICU- discussed with ICU NP- will need to be on Intensivist service Started on Bipap now. On ABX for his pneumonia If not improving- empiric heparin for possible PE as has underlying cancer, CT chest cannot be done due to his ARF on CKD.  Wife was updated by RN about the transfer   Total critical care time spent: 74min

## 2016-03-22 ENCOUNTER — Telehealth: Payer: Self-pay | Admitting: Internal Medicine

## 2016-03-22 LAB — BASIC METABOLIC PANEL
Anion gap: 17 — ABNORMAL HIGH (ref 5–15)
BUN: 134 mg/dL — AB (ref 6–20)
CALCIUM: 8.6 mg/dL — AB (ref 8.9–10.3)
CO2: 22 mmol/L (ref 22–32)
Chloride: 103 mmol/L (ref 101–111)
Creatinine, Ser: 3.4 mg/dL — ABNORMAL HIGH (ref 0.61–1.24)
GFR calc Af Amer: 21 mL/min — ABNORMAL LOW (ref >60)
GFR, EST NON AFRICAN AMERICAN: 18 mL/min — AB (ref >60)
GLUCOSE: 192 mg/dL — AB (ref 65–99)
Potassium: 4.8 mmol/L (ref 3.5–5.1)
Sodium: 142 mmol/L (ref 135–145)

## 2016-03-22 LAB — BLOOD GAS, ARTERIAL
FIO2: 1
Mechanical Rate: 18
PCO2 ART: 119 mmHg — AB (ref 32.0–48.0)
PEEP: 5 cmH2O
Patient temperature: 37
VT: 450 mL
pO2, Arterial: 54 mmHg — ABNORMAL LOW (ref 83.0–108.0)

## 2016-03-22 LAB — HIV ANTIBODY (ROUTINE TESTING W REFLEX): HIV Screen 4th Generation wRfx: NONREACTIVE

## 2016-03-22 MED ORDER — GLYCOPYRROLATE 0.2 MG/ML IJ SOLN: 0.2000 mg | INTRAMUSCULAR | Status: DC | PRN

## 2016-03-22 MED ORDER — FAMOTIDINE IN NACL 20-0.9 MG/50ML-% IV SOLN: 20.0000 mg | INTRAVENOUS | Status: DC

## 2016-03-22 MED ORDER — SODIUM BICARBONATE 8.4 % IV SOLN
INTRAVENOUS | Status: AC
  Filled 2016-03-22: qty 100

## 2016-03-22 MED ORDER — SODIUM BICARBONATE 8.4 % IV SOLN
INTRAVENOUS | Status: AC
  Filled 2016-03-22: qty 150

## 2016-03-22 MED ORDER — SODIUM BICARBONATE 8.4 % IV SOLN
100.0000 meq | Freq: Once | INTRAVENOUS | Status: AC
  Administered 2016-03-22: 100 meq via INTRAVENOUS

## 2016-03-22 MED ORDER — STERILE WATER FOR INJECTION IV SOLN
INTRAVENOUS | Status: DC
  Administered 2016-03-22: 01:00:00 via INTRAVENOUS
  Filled 2016-03-22 (×2): qty 850

## 2016-03-22 MED ORDER — LORAZEPAM 2 MG/ML IJ SOLN: 1.0000 mg | INTRAMUSCULAR | Status: DC | PRN

## 2016-03-22 MED ORDER — SODIUM CHLORIDE 0.9 % IV SOLN: 2.0000 mg/h | INTRAVENOUS | Status: DC

## 2016-03-22 MED ORDER — SCOPOLAMINE 1 MG/3DAYS TD PT72
1.0000 | MEDICATED_PATCH | TRANSDERMAL | Status: DC
  Administered 2016-03-22: 1.5 mg via TRANSDERMAL
  Filled 2016-03-22: qty 1

## 2016-03-22 MED ORDER — MORPHINE 100MG IN NS 100ML (1MG/ML) PREMIX INFUSION
1.0000 mg/h | INTRAVENOUS | Status: DC
  Administered 2016-03-22: 2 mg/h via INTRAVENOUS
  Filled 2016-03-22 (×2): qty 100

## 2016-03-22 MED ORDER — SODIUM BICARBONATE 8.4 % IV SOLN
50.0000 meq | Freq: Once | INTRAVENOUS | Status: AC
  Administered 2016-03-22: 50 meq via INTRAVENOUS

## 2016-03-23 ENCOUNTER — Telehealth: Payer: Self-pay | Admitting: Internal Medicine

## 2016-03-23 ENCOUNTER — Inpatient Hospital Stay: Payer: Self-pay

## 2016-03-23 NOTE — Telephone Encounter (Signed)
Funeral Director calling regarding status of death certificate. Please advise.

## 2016-03-23 NOTE — Telephone Encounter (Signed)
Dr. Mortimer Fries please advise if you know anything about the death certificate for this pt?  The funeral home is calling about getting the death certificate.  thanks

## 2016-03-24 LAB — CULTURE, RESPIRATORY W GRAM STAIN

## 2016-03-24 LAB — CULTURE, RESPIRATORY

## 2016-03-25 ENCOUNTER — Other Ambulatory Visit: Payer: Self-pay | Admitting: Nurse Practitioner

## 2016-03-26 LAB — CULTURE, BLOOD (ROUTINE X 2)
CULTURE: NO GROWTH
Culture: NO GROWTH

## 2016-04-04 NOTE — Progress Notes (Signed)
Extubated to 3L [place]. RN and NP at bedside.

## 2016-04-04 NOTE — Discharge Summary (Signed)
Physician Discharge Summary  Patient ID: ELISHUA KLEMPNER MRN: [id number] DOB/AGE: 1950/11/19 66 y.o.  Admit date: 04/03/2016 Discharge date: 04/13/2016    Discharge Diagnoses:        Acute hypoxic hypercapnic respiratory failure secondary to RUL pneumonia       Septic shock secondary to Pneumonia       Hyperkalemia        Acute on chronic Renal Failure       COPD       Aspiration Pneumonia        Squamous Cell Esophageal Cancer Stage IV       Severe Protein Malnutrition       Gastrostomy Tube                                                                         DEATH SUMMARY   JACHAI KENNER is a 66 y.o. y/o male with a PMH of Bladder Cancer s/p radical cystectomy with neobladder urinary diversion, pelvic lymph node dissection, and bilateral ureterotomies at [facility] (12/2011), Squamous Cell Esophageal Cancer Stage IV dx 05/13/2105 (currently receiving radiation), Severe Protein Malnutrition, Gastrostomy Tube, Aspiration Pneumonia, Right chest portacath, CKD stage 3 (baseline creat 1.6-2.0), Emphysema, ETOH abuse, Chronic chest wall pain, Former Smoker, Hyperlipidemia, and HTN.  He presented to [facility] ER 12/18 with c/o worsening cough, pleuritic chest pain during coughing, and wheezing onset 12/15.  Upon arrival to ER his O2 sats were 83% on RA and chest xray revealed right sided pneumonia.  He was diagnosed with pneumonia 1 month ago and was treated with antibiotics.  He was subsequently admitted to the oncology unit this admission for further treatment.  On 12/18 PCCM contacted for additional management of acute hypoxic respiratory failure secondary to RUL pneumonia requiring Bipap and transfer to ICU due to worsening respiratory failure he required mechanical intubation.  After speaking with family again about poor prognosis post extubation the pts family decided pt should be transitioned to comfort care measures only.   SIGNIFICANT DIAGNOSTIC STUDIES None  SIGNIFICANT EVENTS   12/18-Admitted to oncology unit due to worsening shortness of breath secondary to RUL pneumonia 12/18-Pt transferred to ICU due to worsening dyspnea secondary to RUL pneumonia requiring Bipap PCCM consulted for additional management due worsening respiratory failure requiring mechanical ventilation  MICRO DATA  None  ANTIBIOTICS Vancomycin 12/19 Cefepime 12/19  CONSULTS Intensivist  TUBES / LINES Right internal jugular CVL 12/18>> ETT 12/18>>12/19  Death Exam: No heart tones auscultated, no palpable pulses, asytole on cardiac monitor, and respirations absent   Vitals:   03/12/2016 1732 03/30/2016 1738 03/28/2016 2027 03/13/2016 2036  BP:      Pulse:      Resp: (!) 22 (!) 21    Temp:   97.6 F (36.4 C)   SpO2: (!) 2% 92% 97% 97%  Weight:   104 lb 0.9 oz (47.2 kg)   Height:   5\' 2"  (1.575 m)      Discharge Labs  BMET  Recent Labs Lab 03/25/2016 0949 03/20/2016 2158  NA 139 142  K 5.6* 4.8  CL 97* 103  CO2 22 22  GLUCOSE 194* 192*  BUN 131* 134*  CREATININE 3.63* 3.40*  CALCIUM 9.7 8.6*  CBC  Recent Labs Lab 03/24/2016 1018  HGB 9.4*  HCT 28.5*  WBC 4.5  PLT 200    Anti-Coagulation No results for input(s): INR in the last 168 hours.    Pt expired at 04:00 am 2016-04-04 with family present at bedside.  Marda Stalker, [place] Pager [phone] (please enter 7 digits) PCCM Consult Pager [phone] (please enter 7 digits)

## 2016-04-04 NOTE — Progress Notes (Signed)
Pt expired at 0400 with family bedside.  Marda Stalker NP pronounced.  Family left approx 0415.  [facility] notified.  Not a candidate.  Family to notify nursing supervisor of choice of funeral home.

## 2016-04-04 NOTE — Telephone Encounter (Signed)
Received Death Certificate from [facility] home.  Please call when ready for pick up .

## 2016-04-04 NOTE — Progress Notes (Signed)
Chaplain rounded the unit to provide a compassionate presence and support to the family. Chaplain Murlene Revell [phone] 

## 2016-04-04 NOTE — Progress Notes (Signed)
Placed on comfort care by family request.  Morphine started for comfort.  Family bedside.

## 2016-04-04 NOTE — Progress Notes (Signed)
Spoke with pts family about overall prognosis and code status.  Per pts wife's request pts code status changed from Full Code to DNR.  Will continue to monitor and assess pt.  Marda Stalker, [place] Pager [phone] (please enter 7 digits) PCCM Consult Pager [phone] (please enter 7 digits)

## 2016-04-04 NOTE — Progress Notes (Signed)
eLink Physician-Brief Progress Note Patient Name: Jeffrey [name] DOB: Dec 12, 1950 MRN: [id number]   Date of Service  04/17/16  HPI/Events of Note  Intubated  eICU Interventions  Order SUP with pepcid        Benjamin Casanas 04/17/2016, 12:20 AM

## 2016-04-04 NOTE — Progress Notes (Signed)
Pharmacist - Prescriber Communication  Famotidine 20 mg IV Q12H has been modified to famotidine 20 mg IV Q48H for CrCl approximately 15 mL/min.  Messina Kosinski A. [place], [place].D., BCPS Clinical Pharmacist 04/10/2016 [phone]

## 2016-04-04 DEATH — deceased

## 2016-04-18 ENCOUNTER — Telehealth: Payer: Self-pay | Admitting: Internal Medicine

## 2016-04-18 NOTE — Telephone Encounter (Signed)
I spoke to his wife; offered my condolences. Wife very appreciative of the care that they have gotten at the cancer center.

## 2016-04-19 IMAGING — XA IR PERC PLACEMENT GASTROSTOMY
4 series · 4 of 4 positions shown · non-contrast
Comparison: none

INDICATION: Poorly functioning gastrostomy which has fallen out of the stomach

[Series 1: fl - angio · 1 of 1 slices shown (1 of 4)]
[im 1/1]
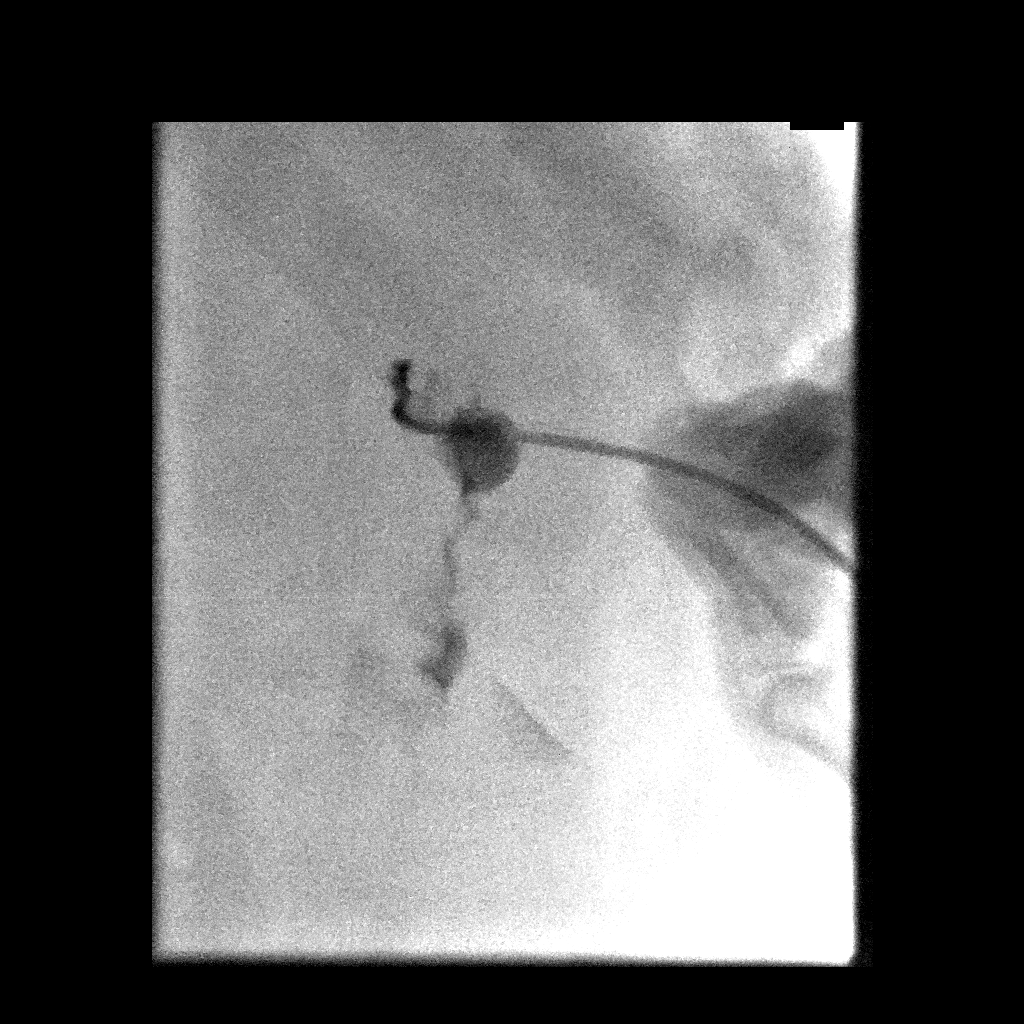

[Series 2: fl - angio · 1 of 1 slices shown (2 of 4)]
[im 1/1]
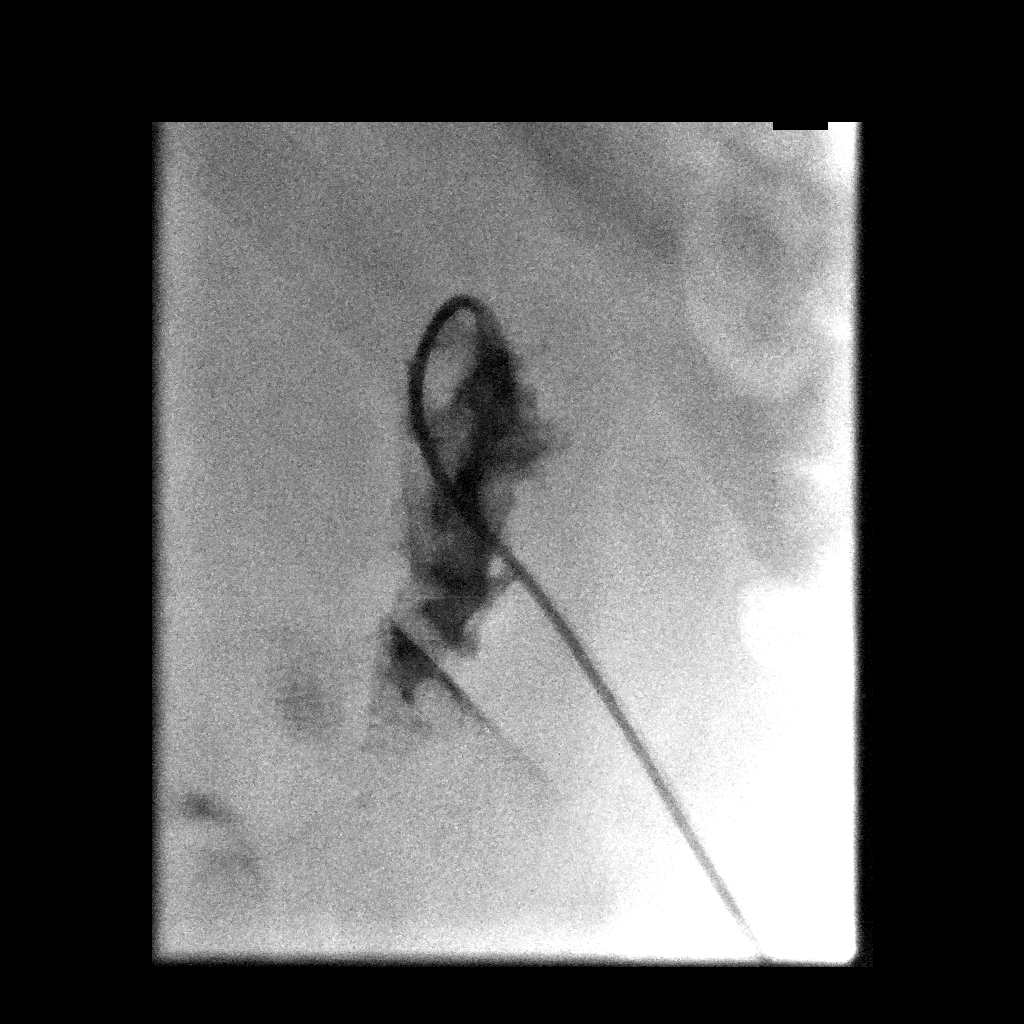

[Series 3: fl - angio · 1 of 1 slices shown (3 of 4)]
[im 1/1]
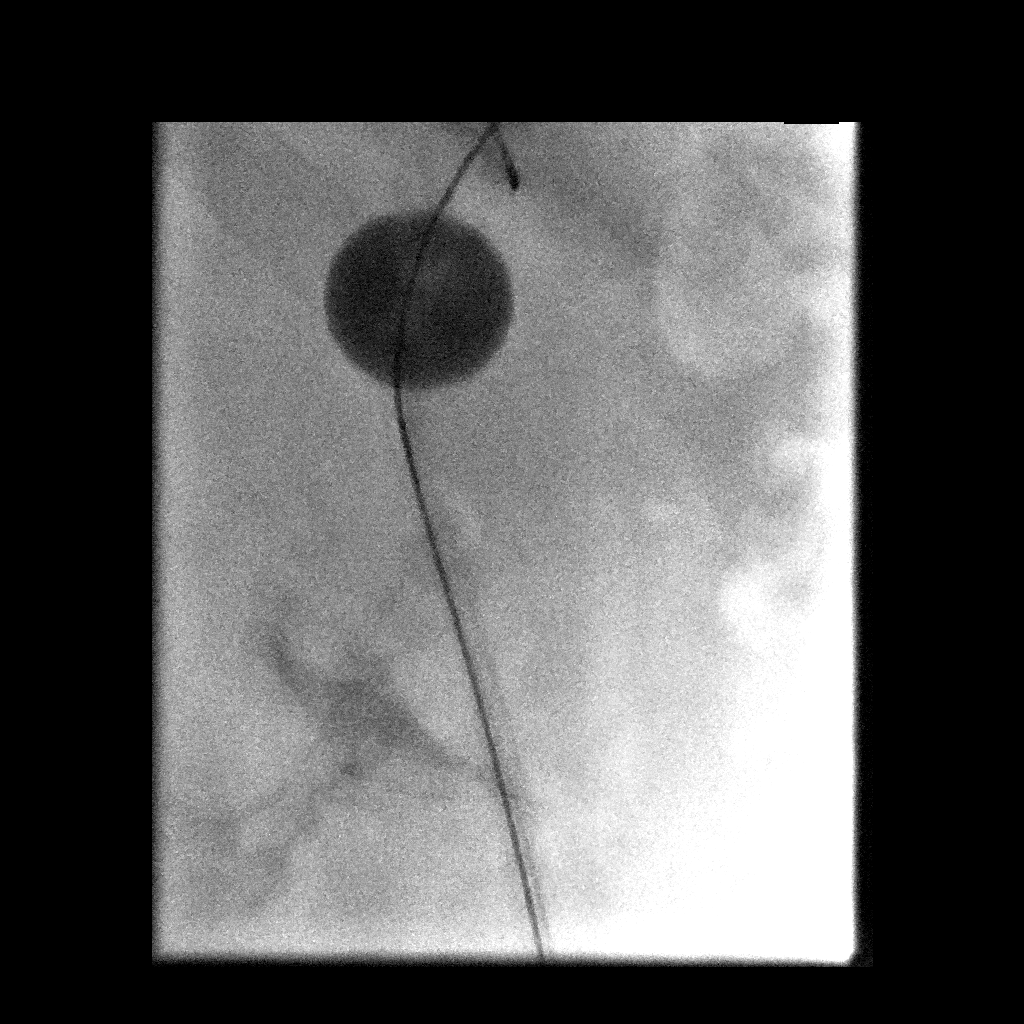

[Series 4: fl - angio · 1 of 1 slices shown (4 of 4)]
[im 1/1]
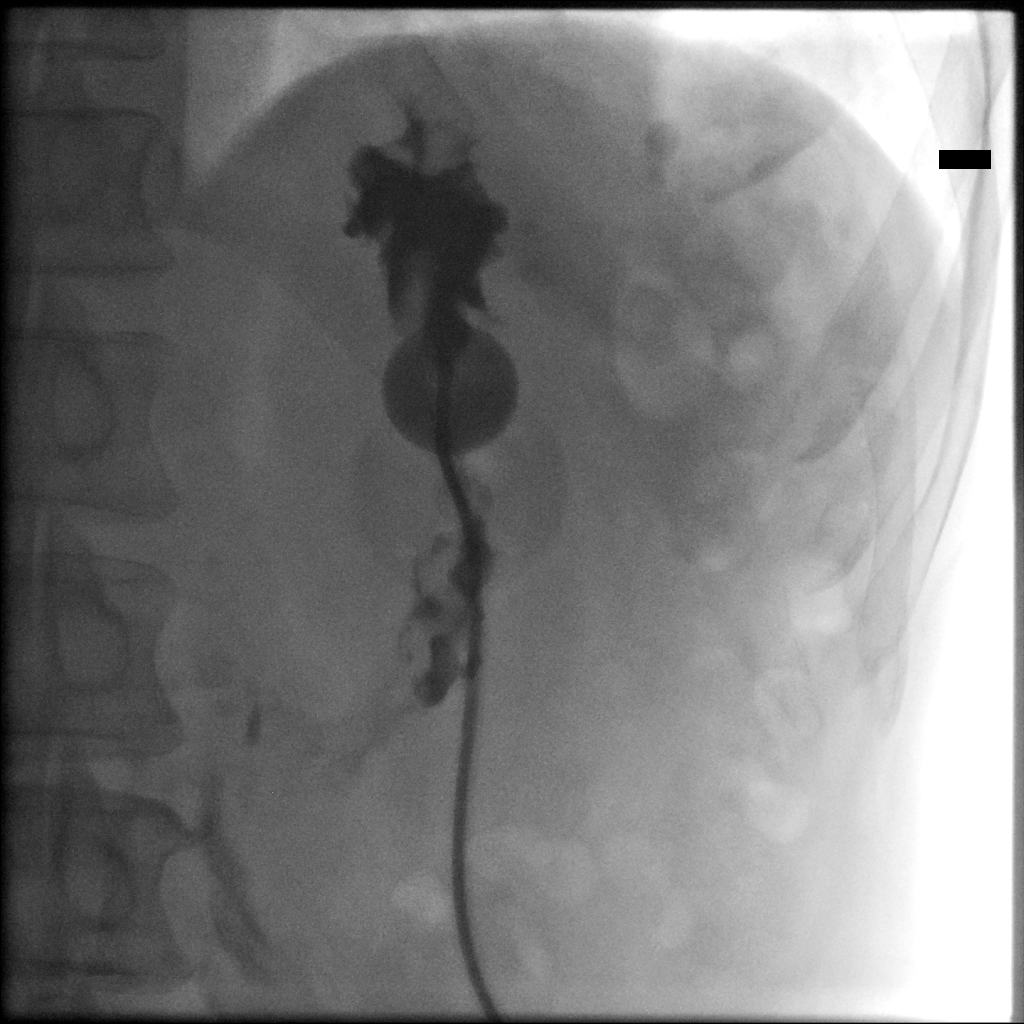

[4 of 4 positions shown; findings below may reference images not displayed]

EXAM:
Gastrostomy replacement

MEDICATIONS:
None

ANESTHESIA/SEDATION:
None

CONTRAST:  15mL OMNIPAQUE IOHEXOL 300 MG/ML SOLN - administered into
the gastric lumen.

FLUOROSCOPY TIME:  Fluoroscopy Time: 1 minutes 6 seconds (35.48 mGy
mGy).

COMPLICATIONS:
None immediate.

PROCEDURE:
Informed written consent was obtained from the patient after a
thorough discussion of the procedural risks, benefits and
alternatives. All questions were addressed. Maximal Sterile Barrier
Technique was utilized including caps, mask, sterile gowns, sterile
gloves, sterile drape, hand hygiene and skin antiseptic. A timeout
was performed prior to the initiation of the procedure.

Utilizing a short Kumpe catheter and contrast material of the tract
from the skin to the stomach was visualized with easy passage of
contrast into the stomach. The catheter was then maneuvered through
the tract into the gastric lumen an a wire was coiled in the gastric
fundus. Dilatation of 14 French was performed and a 14 French
balloon tipped gastrostomy catheter was placed without difficulty.
The balloon was inflated and the catheter withdrawn so the balloon
lies along the anterior aspect of the gastric wall. The patient
tolerated the procedure well and was returned to his room in
satisfactory condition.
IMPRESSION: Successful replacement of 14 French gastrostomy catheter.

## 2016-11-14 IMAGING — CR DG CHEST 2V
1 series · 2 of 2 positions shown · non-contrast
Comparison: Radiographs November 30, 2015.

CLINICAL DATA: Productive cough.  Chest pain.

EXAM:
CHEST  2 VIEW

[Series 1: dg chest 2 view · 0.14mm/px · 2 of 2 slices shown]
[im 1/2]
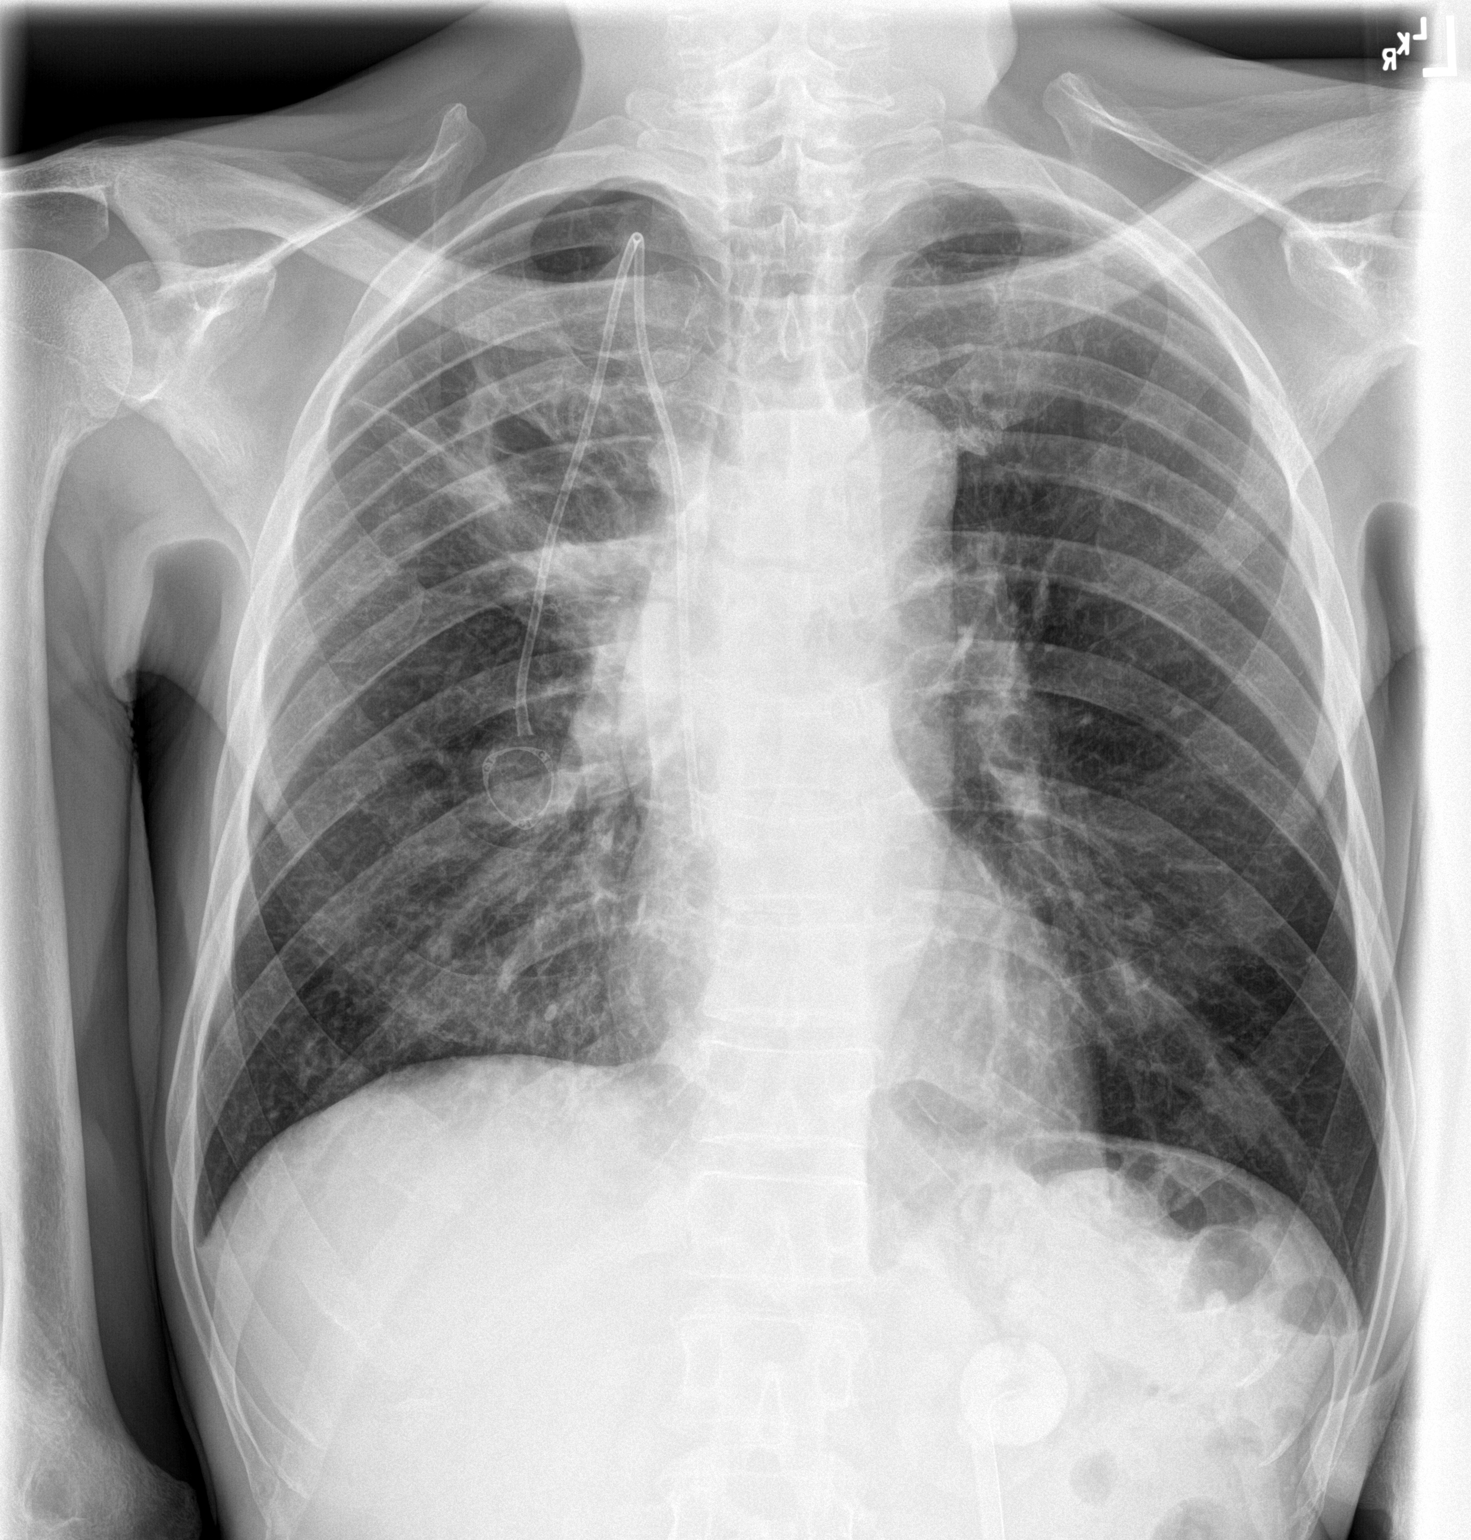
[im 2/2]
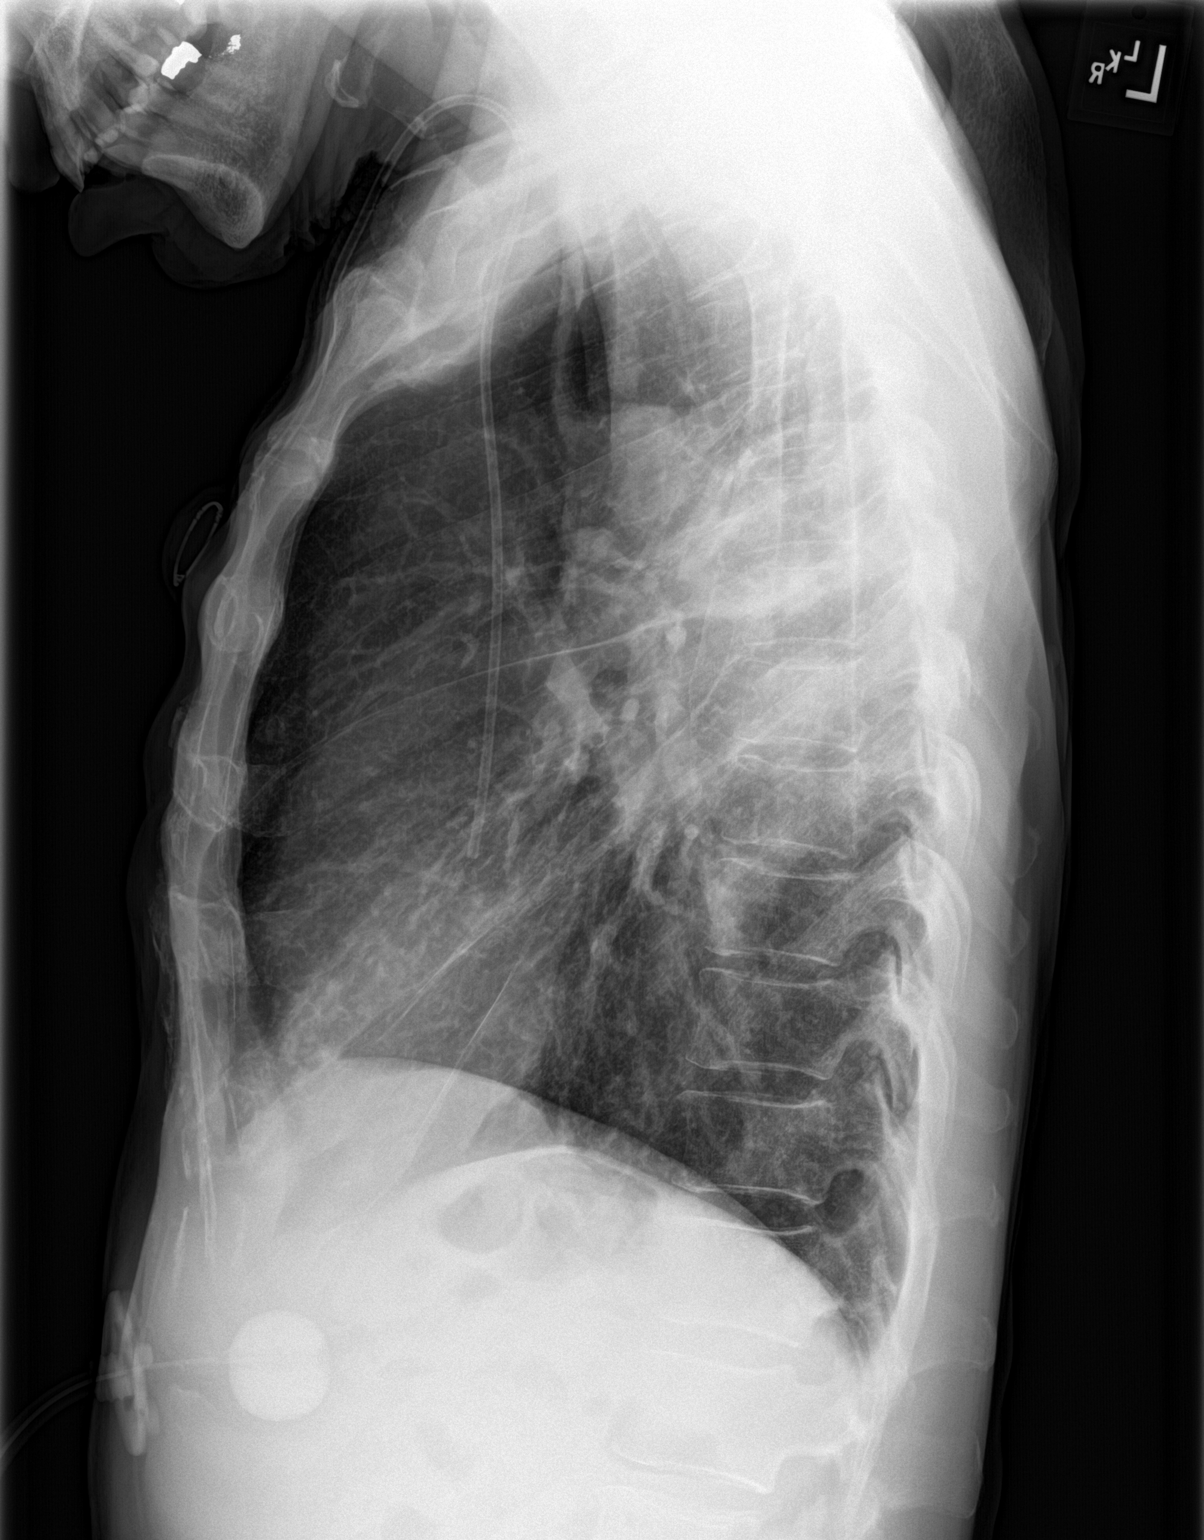

[2 of 2 positions shown; findings below may reference images not displayed]

FINDINGS: The heart size and mediastinal contours are within normal limits. No
pneumothorax or pleural effusion is noted. Right internal jugular
Port-A-Cath is unchanged in position. Right upper lobe opacity is
slightly improved compared to prior exam consistent with improving
cavitary pneumonia or mass compared to prior exam. Gastrostomy tube
is noted as well. The visualized skeletal structures are
unremarkable.
IMPRESSION: Improved right upper lobe opacity is noted consistent with improving
cavitary pneumonia or mass compared to prior exam.

## 2017-01-27 IMAGING — DX DG CHEST 1V PORT
1 series · 1 of 1 positions shown · non-contrast
Comparison: Chest radiograph 03/21/2016 at [DATE] a.m.

Chest CT 11/24/2015

CLINICAL DATA: Cough and dyspnea.  Acute respiratory failure.

EXAM:
PORTABLE CHEST 1 VIEW

[chest ap]
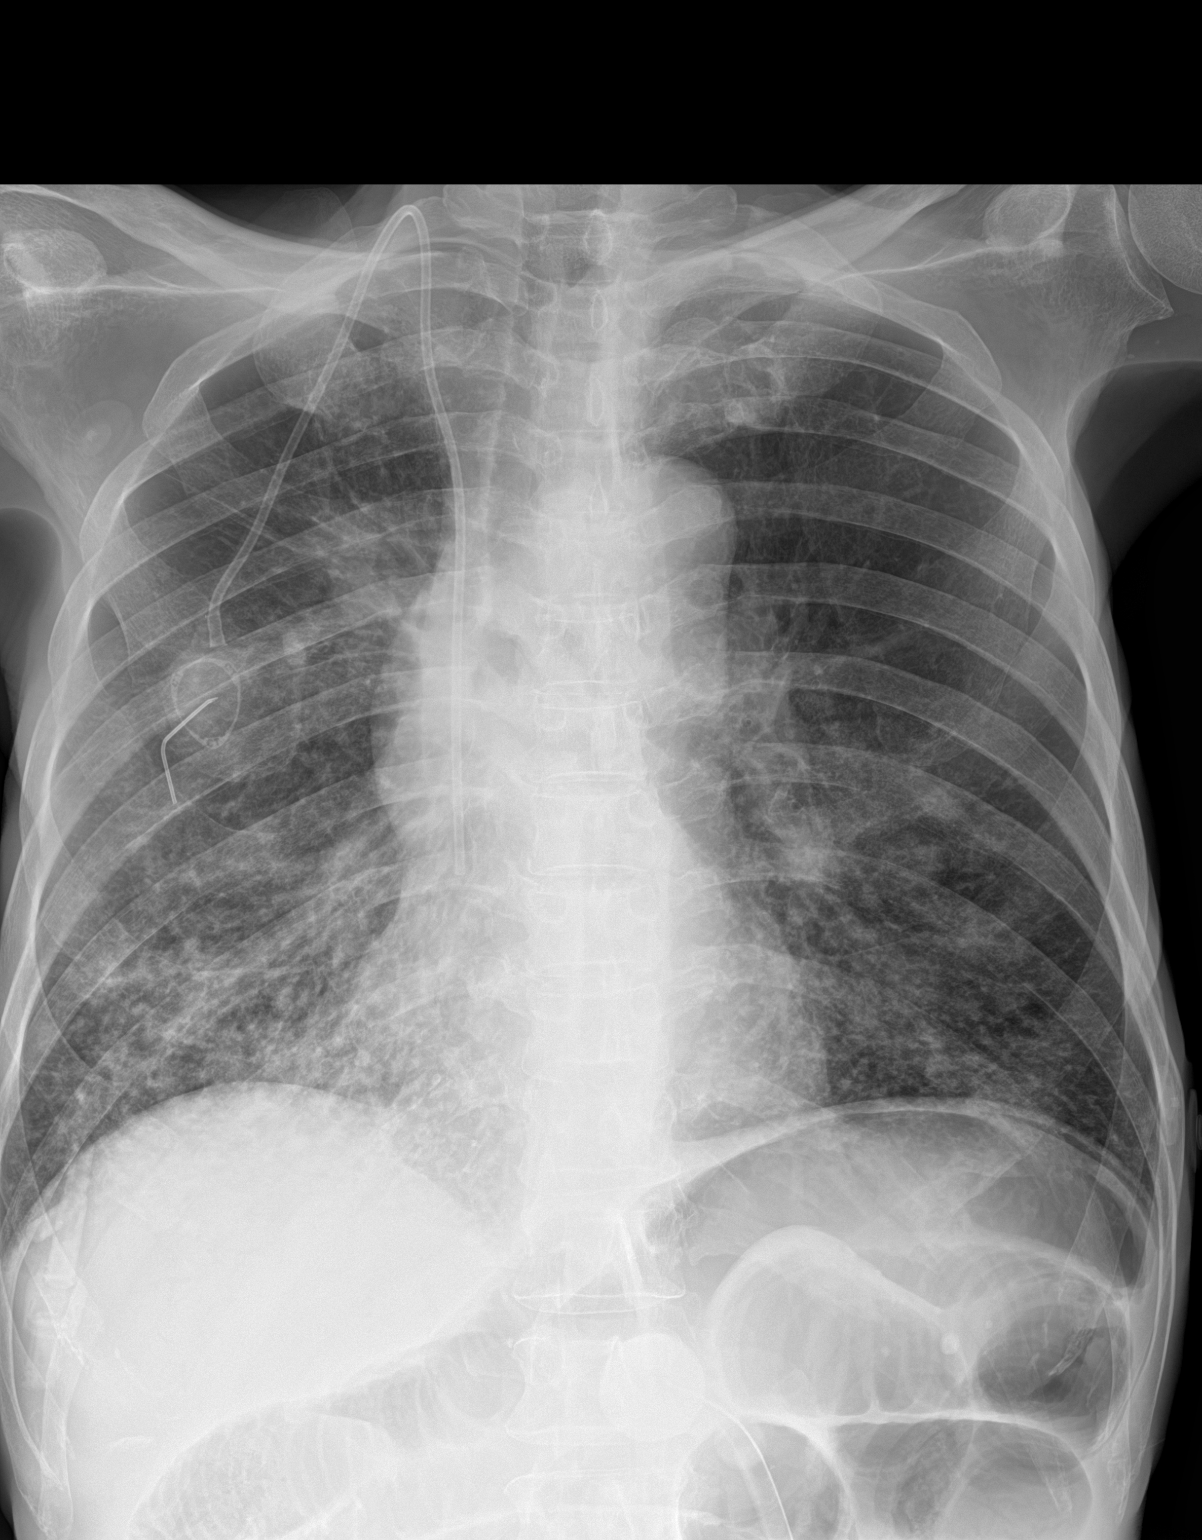

[1 of 1 positions shown; findings below may reference images not displayed]

FINDINGS: Accessed right IJ Port-A-Cath is unchanged in position with tip in
the proximal right atrium.

There is no pneumothorax or pleural effusion. Parenchymal opacities
in the right middle lobe and left lower lobe remain. No new areas of
consolidation. Ectatic aorta is again noted.
IMPRESSION: Unchanged examination with right middle lobe pneumonia. Unchanged
left lower lung opacities, which also may indicate pneumonia.

## 2017-01-27 IMAGING — CR DG CHEST 2V
1 series · 3 of 3 positions shown · non-contrast
Comparison: 02/14/2016

CLINICAL DATA: Short of breath, right rhonchi. Esophageal cancer
and emphysema

EXAM:
CHEST  2 VIEW

[Series 1: dg chest 2 view · 0.14mm/px · 3 of 3 slices shown]
[im 1/3]
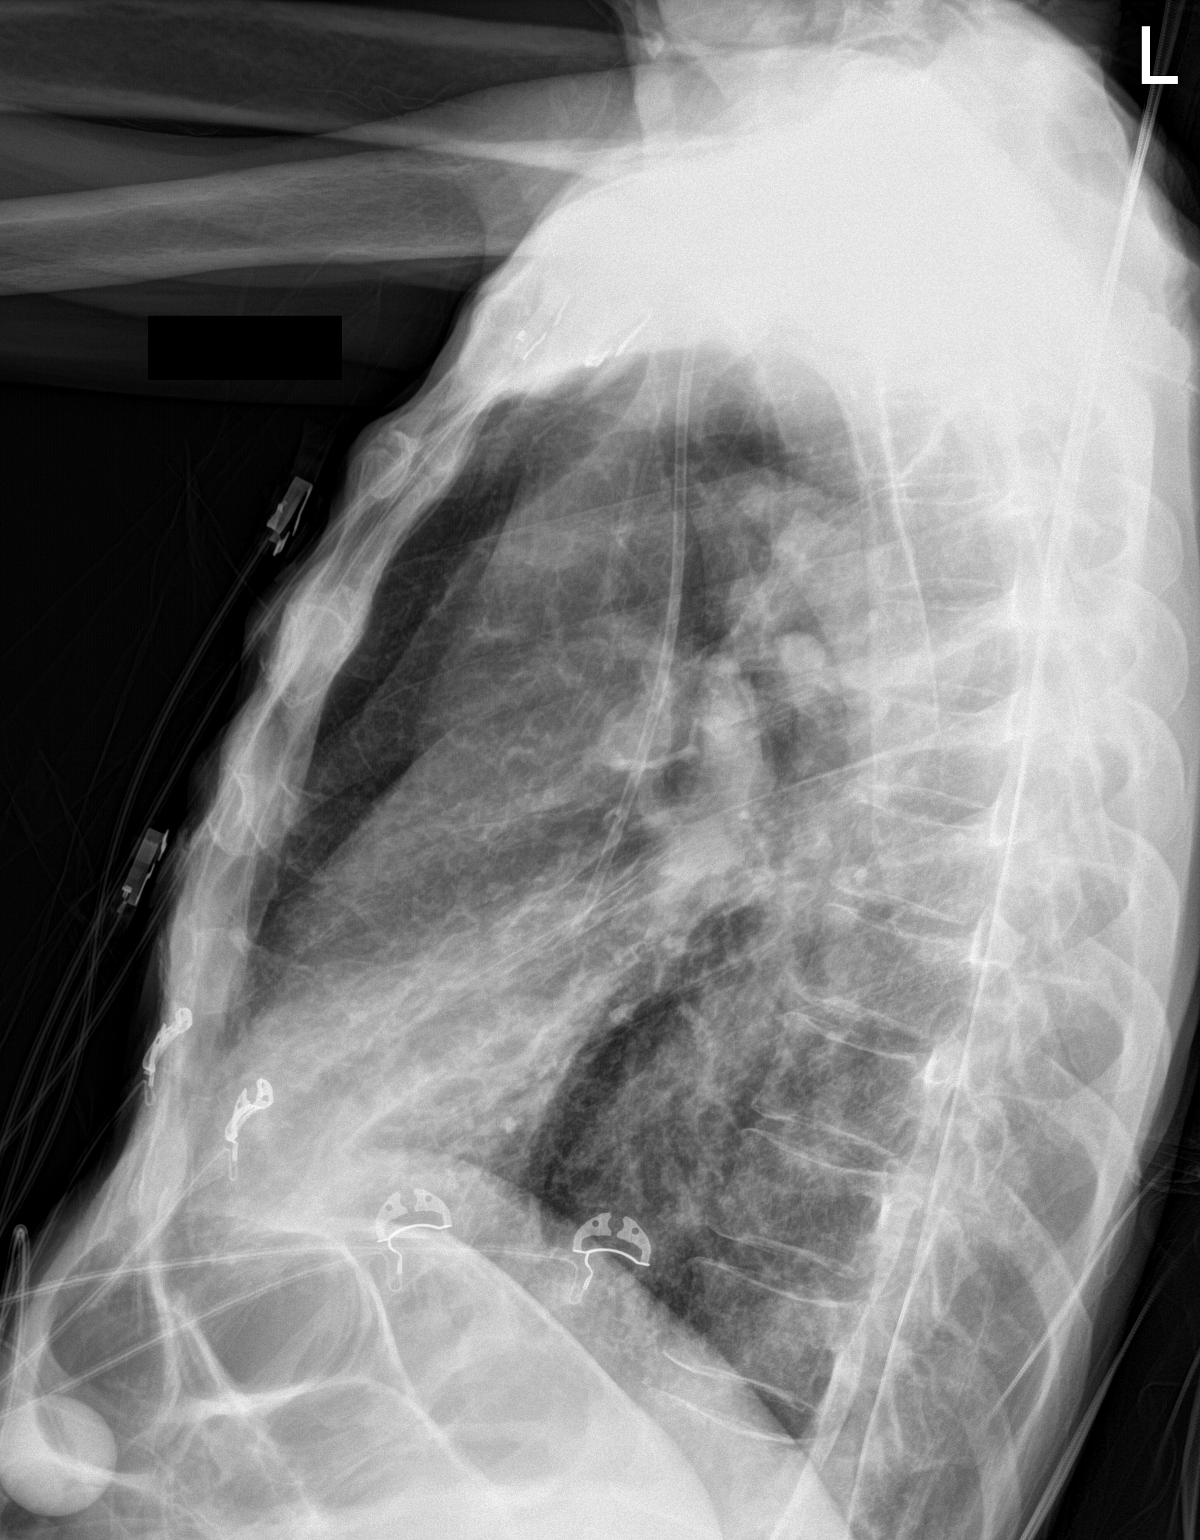
[im 2/3]
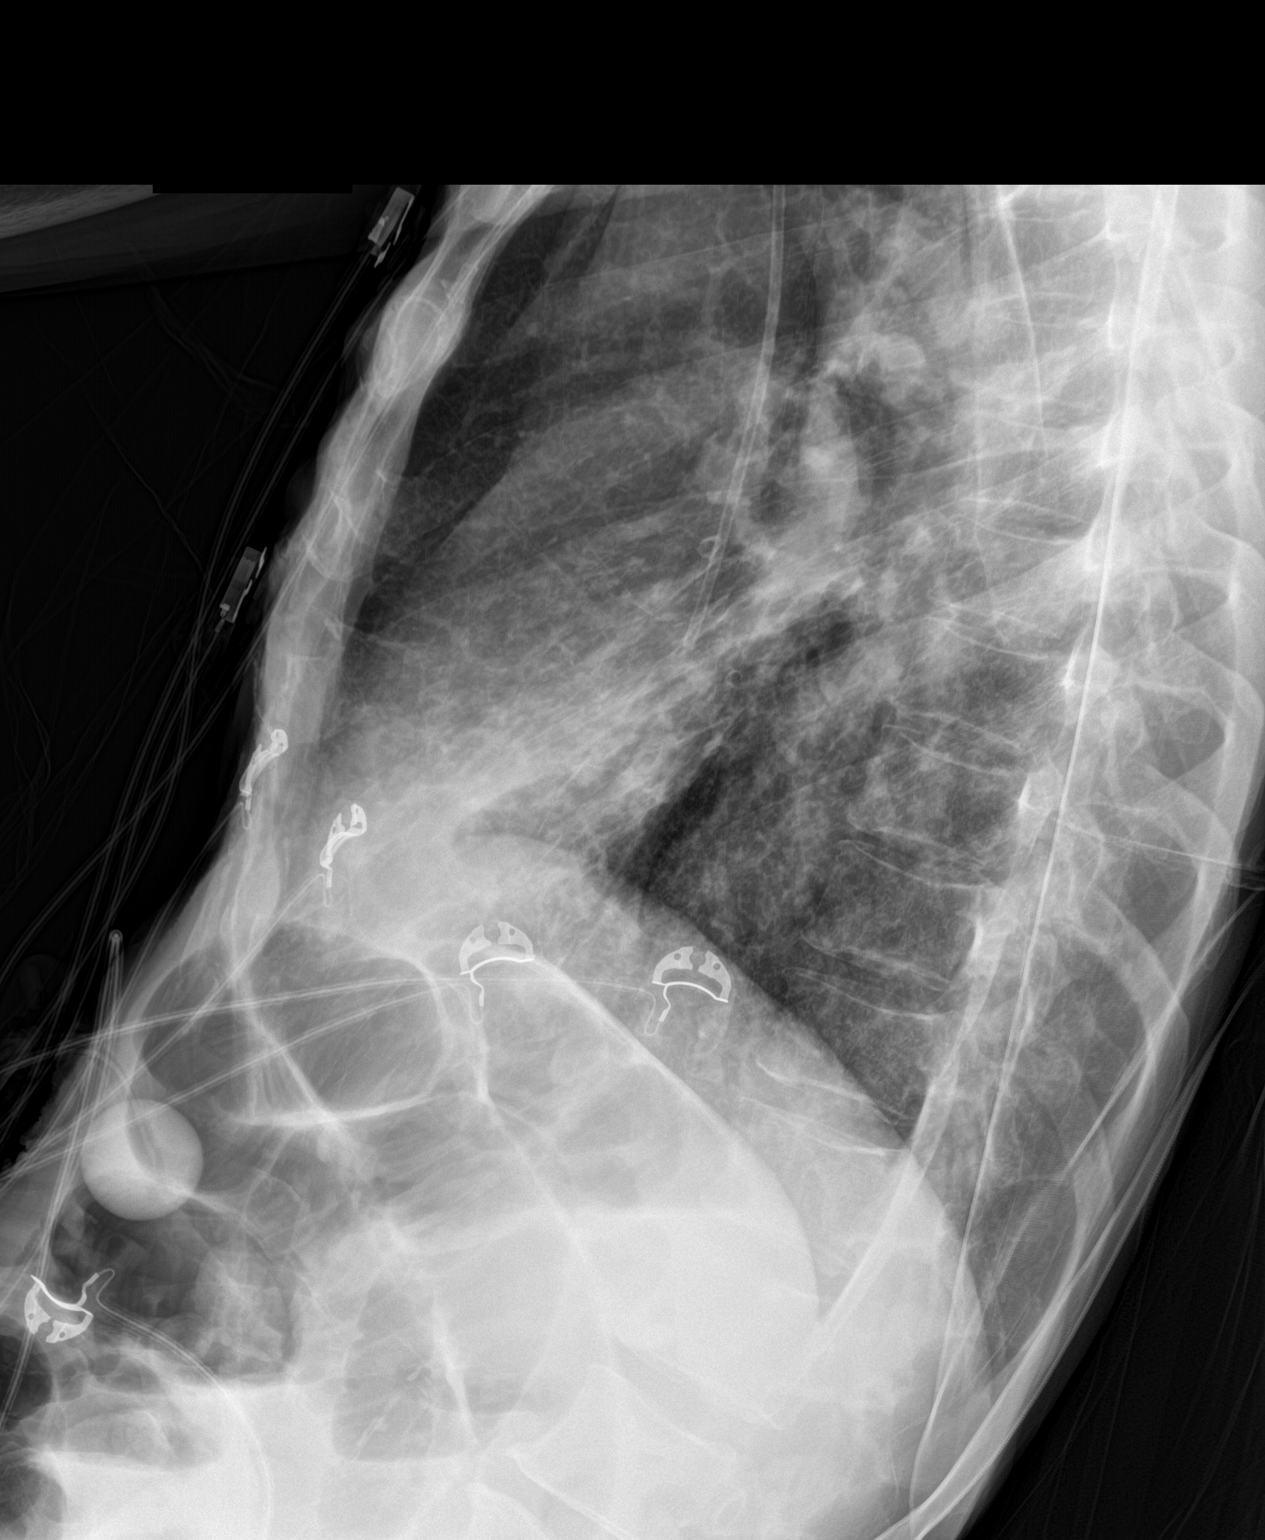
[im 3/3]
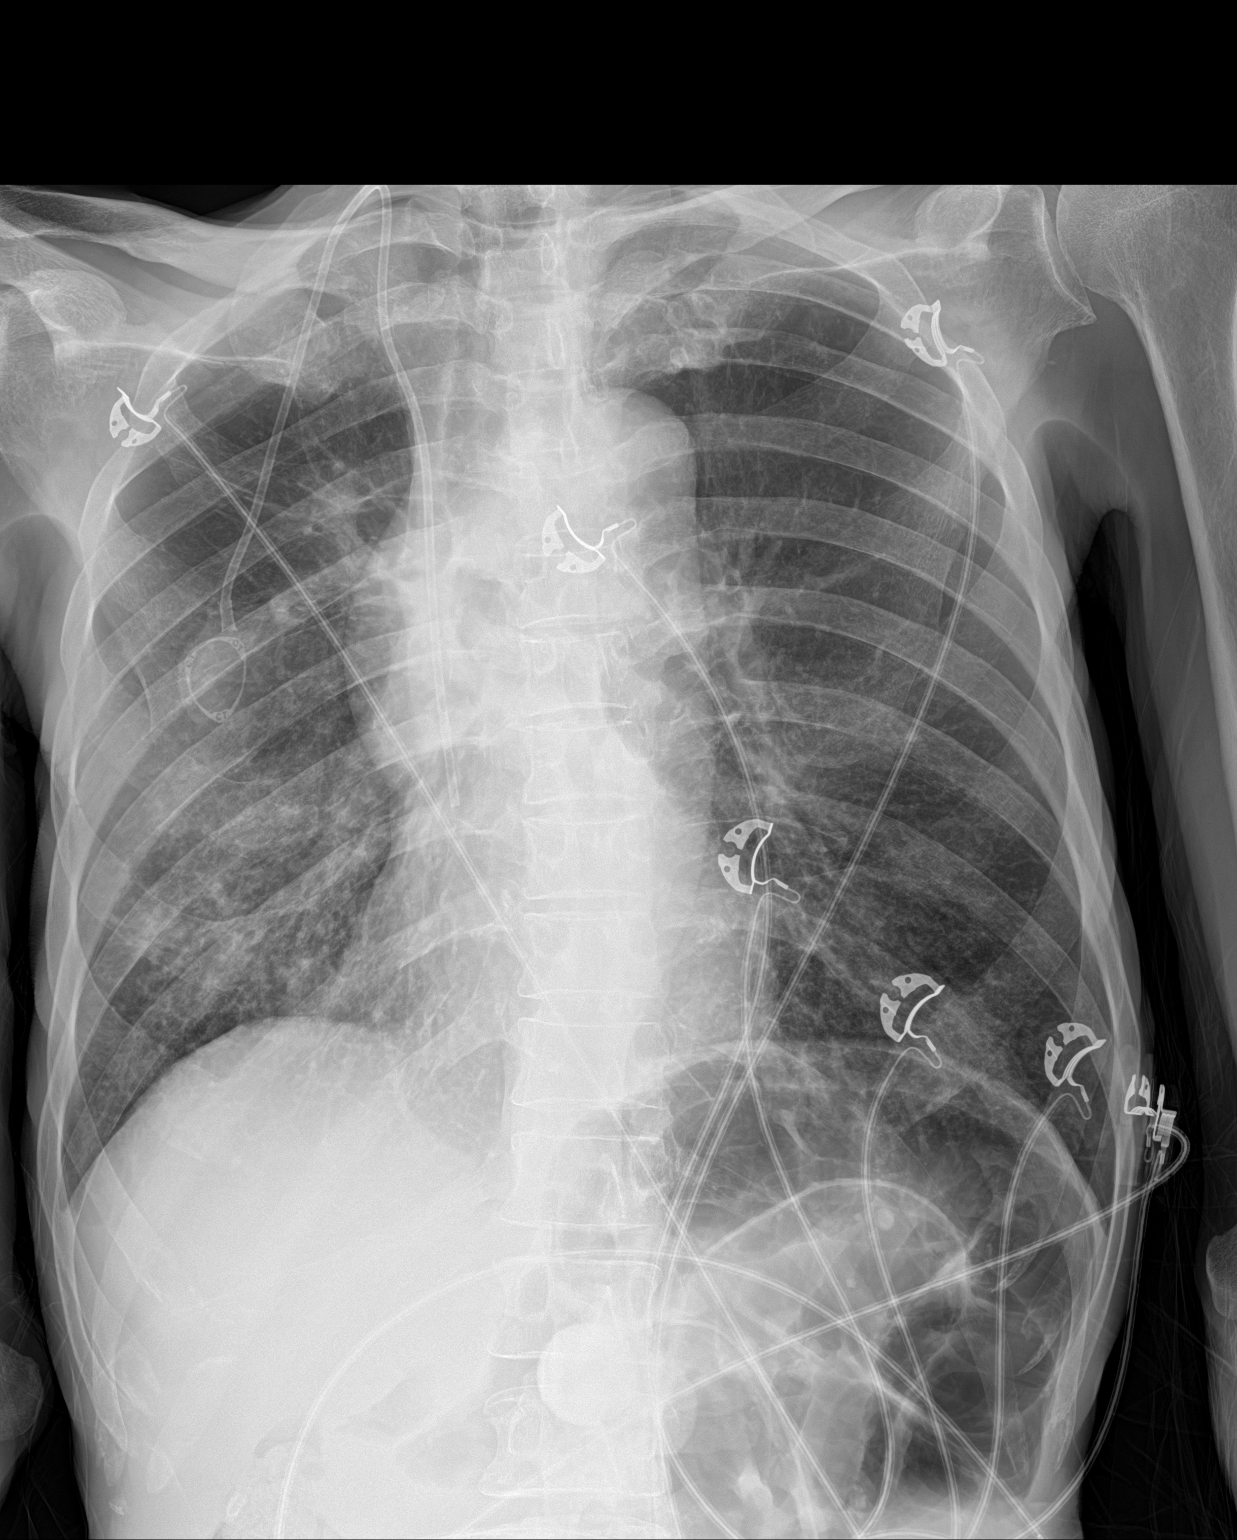

[3 of 3 positions shown; findings below may reference images not displayed]

FINDINGS: COPD with pulmonary hyperinflation. Right middle lobe infiltrate
similar to the prior study and likely due to pneumonia. Improvement
in mild left lower lobe infiltrate. Improvement in right upper lobe
infiltrate. No effusion or heart failure.

Port-A-Cath tip at the cavoatrial junction unchanged. Right
mediastinal contour bulge may be related to mediastinal mass and
adenopathy.

Dilated bowel loops.  Gastrostomy.
IMPRESSION: COPD with pulmonary hyperinflation.

Right middle lobe infiltrate unchanged, probable pneumonia.
Improvement in left lower lobe and right upper lobe infiltrates

Mediastinal mass.

Dilated bowel loops, probable ileus versus bowel obstruction.

## 2017-01-27 IMAGING — DX DG CHEST 1V PORT
1 series · 1 of 1 positions shown · non-contrast
Comparison: 03/21/16 at [DATE]

CLINICAL DATA: Central line placement.  Respiratory failure.

EXAM:
PORTABLE CHEST 1 VIEW

[chest ap]
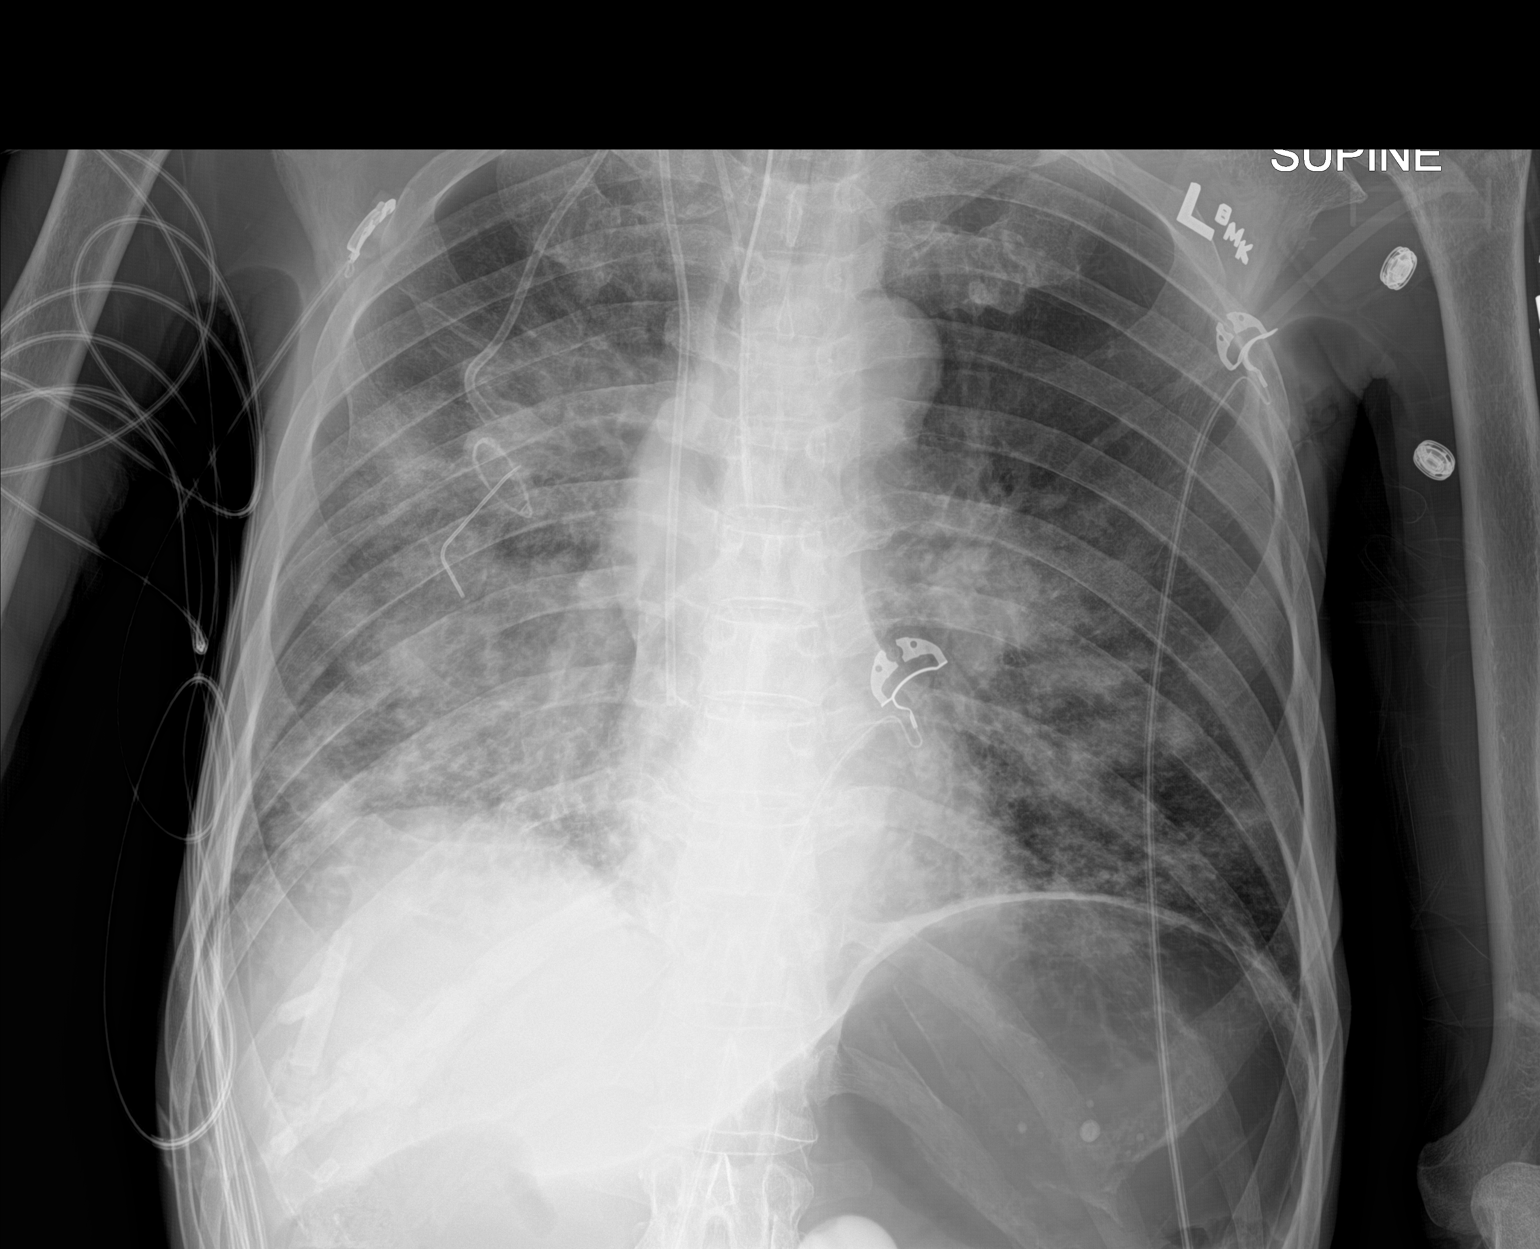

[1 of 1 positions shown; findings below may reference images not displayed]

FINDINGS: There is a new right jugular central line with tip in the expected
location of the SVC approximately 1 cm below the azygos vein
junction.

Endotracheal tube is 4.3 cm above the carina.

Right-sided port extends to the cavoatrial junction.

No pneumothorax.

Unchanged airspace opacities in the central and basilar regions,
right greater than left.
IMPRESSION: 1. Support equipment appears satisfactorily positioned. New right
jugular central line.

2. No pneumothorax.
3. No significant interval change in the bilateral airspace
opacities.
